# Patient Record
Sex: Female | Born: 1970
Health system: Southern US, Community
[De-identification: ages and names within clinical notes are randomized; demographics above are authoritative.]

## PROBLEM LIST (undated history)

## (undated) DIAGNOSIS — F419 Anxiety disorder, unspecified: Secondary | ICD-10-CM

## (undated) DIAGNOSIS — K219 Gastro-esophageal reflux disease without esophagitis: Secondary | ICD-10-CM

## (undated) DIAGNOSIS — R7989 Other specified abnormal findings of blood chemistry: Secondary | ICD-10-CM

## (undated) DIAGNOSIS — T884XXA Failed or difficult intubation, initial encounter: Secondary | ICD-10-CM

## (undated) DIAGNOSIS — M7710 Lateral epicondylitis, unspecified elbow: Secondary | ICD-10-CM

## (undated) DIAGNOSIS — R7309 Other abnormal glucose: Secondary | ICD-10-CM

## (undated) DIAGNOSIS — E785 Hyperlipidemia, unspecified: Secondary | ICD-10-CM

## (undated) DIAGNOSIS — R945 Abnormal results of liver function studies: Secondary | ICD-10-CM

## (undated) DIAGNOSIS — T7840XA Allergy, unspecified, initial encounter: Secondary | ICD-10-CM

## (undated) DIAGNOSIS — Z9889 Other specified postprocedural states: Secondary | ICD-10-CM

## (undated) HISTORY — PX: TONSILLECTOMY: SUR1361

## (undated) HISTORY — DX: Gastro-esophageal reflux disease without esophagitis: K21.9

## (undated) HISTORY — DX: Lateral epicondylitis, unspecified elbow: M77.10

## (undated) HISTORY — PX: FOOT SURGERY: SHX648

## (undated) HISTORY — DX: Anxiety disorder, unspecified: F41.9

## (undated) HISTORY — DX: Allergy, unspecified, initial encounter: T78.40XA

## (undated) HISTORY — DX: Other abnormal glucose: R73.09

## (undated) HISTORY — DX: Hyperlipidemia, unspecified: E78.5

## (undated) HISTORY — DX: Abnormal results of liver function studies: R94.5

## (undated) HISTORY — DX: Other specified postprocedural states: Z98.890

## (undated) HISTORY — DX: Other specified abnormal findings of blood chemistry: R79.89

## (undated) HISTORY — PX: ENDOMETRIAL ABLATION: SHX621

---

## 2001-10-11 ENCOUNTER — Encounter: Payer: Self-pay | Admitting: Emergency Medicine

## 2001-10-11 ENCOUNTER — Emergency Department (HOSPITAL_COMMUNITY): Admission: EM | Admit: 2001-10-11 | Discharge: 2001-10-11 | Payer: Self-pay | Admitting: Emergency Medicine

## 2004-08-12 ENCOUNTER — Encounter: Payer: Self-pay | Admitting: Family Medicine

## 2004-08-12 LAB — CONVERTED CEMR LAB

## 2004-08-21 ENCOUNTER — Other Ambulatory Visit: Admission: RE | Admit: 2004-08-21 | Discharge: 2004-08-21 | Payer: Self-pay | Admitting: Obstetrics and Gynecology

## 2005-01-14 ENCOUNTER — Ambulatory Visit: Payer: Self-pay | Admitting: Family Medicine

## 2005-02-13 ENCOUNTER — Ambulatory Visit: Payer: Self-pay | Admitting: Family Medicine

## 2005-02-19 ENCOUNTER — Ambulatory Visit: Payer: Self-pay | Admitting: Family Medicine

## 2005-06-24 ENCOUNTER — Ambulatory Visit: Payer: Self-pay | Admitting: Family Medicine

## 2005-08-10 ENCOUNTER — Ambulatory Visit: Payer: Self-pay | Admitting: Family Medicine

## 2005-09-04 ENCOUNTER — Ambulatory Visit: Payer: Self-pay | Admitting: Family Medicine

## 2005-11-13 ENCOUNTER — Other Ambulatory Visit: Admission: RE | Admit: 2005-11-13 | Discharge: 2005-11-13 | Payer: Self-pay | Admitting: Obstetrics and Gynecology

## 2005-12-04 ENCOUNTER — Ambulatory Visit: Payer: Self-pay | Admitting: Family Medicine

## 2006-02-03 ENCOUNTER — Ambulatory Visit: Payer: Self-pay | Admitting: Family Medicine

## 2006-12-04 ENCOUNTER — Emergency Department (HOSPITAL_COMMUNITY): Admission: EM | Admit: 2006-12-04 | Discharge: 2006-12-04 | Payer: Self-pay | Admitting: Emergency Medicine

## 2006-12-24 ENCOUNTER — Ambulatory Visit: Payer: Self-pay | Admitting: Family Medicine

## 2007-02-10 ENCOUNTER — Encounter: Payer: Self-pay | Admitting: Family Medicine

## 2007-02-10 DIAGNOSIS — IMO0002 Reserved for concepts with insufficient information to code with codable children: Secondary | ICD-10-CM | POA: Insufficient documentation

## 2007-02-10 DIAGNOSIS — E785 Hyperlipidemia, unspecified: Secondary | ICD-10-CM | POA: Insufficient documentation

## 2007-02-10 DIAGNOSIS — K589 Irritable bowel syndrome without diarrhea: Secondary | ICD-10-CM | POA: Insufficient documentation

## 2007-02-10 DIAGNOSIS — J309 Allergic rhinitis, unspecified: Secondary | ICD-10-CM | POA: Insufficient documentation

## 2007-02-10 DIAGNOSIS — K219 Gastro-esophageal reflux disease without esophagitis: Secondary | ICD-10-CM | POA: Insufficient documentation

## 2007-02-10 DIAGNOSIS — F438 Other reactions to severe stress: Secondary | ICD-10-CM

## 2007-02-10 DIAGNOSIS — F419 Anxiety disorder, unspecified: Secondary | ICD-10-CM | POA: Insufficient documentation

## 2007-02-11 ENCOUNTER — Ambulatory Visit: Payer: Self-pay | Admitting: Family Medicine

## 2007-02-11 DIAGNOSIS — D485 Neoplasm of uncertain behavior of skin: Secondary | ICD-10-CM | POA: Insufficient documentation

## 2007-02-14 ENCOUNTER — Encounter: Payer: Self-pay | Admitting: Family Medicine

## 2007-02-14 ENCOUNTER — Ambulatory Visit (HOSPITAL_BASED_OUTPATIENT_CLINIC_OR_DEPARTMENT_OTHER): Admission: RE | Admit: 2007-02-14 | Discharge: 2007-02-14 | Payer: Self-pay | Admitting: Podiatry

## 2007-05-28 ENCOUNTER — Ambulatory Visit: Payer: Self-pay | Admitting: Family Medicine

## 2007-05-28 ENCOUNTER — Encounter: Payer: Self-pay | Admitting: Family Medicine

## 2007-11-11 ENCOUNTER — Ambulatory Visit: Payer: Self-pay | Admitting: Family Medicine

## 2007-11-11 DIAGNOSIS — D2360 Other benign neoplasm of skin of unspecified upper limb, including shoulder: Secondary | ICD-10-CM | POA: Insufficient documentation

## 2008-01-06 ENCOUNTER — Encounter: Admission: RE | Admit: 2008-01-06 | Discharge: 2008-01-06 | Payer: Self-pay | Admitting: Obstetrics and Gynecology

## 2008-07-25 ENCOUNTER — Ambulatory Visit: Payer: Self-pay | Admitting: Family Medicine

## 2008-07-25 DIAGNOSIS — K1370 Unspecified lesions of oral mucosa: Secondary | ICD-10-CM | POA: Insufficient documentation

## 2009-05-08 ENCOUNTER — Ambulatory Visit: Payer: Self-pay | Admitting: Family Medicine

## 2009-12-26 ENCOUNTER — Encounter: Admission: RE | Admit: 2009-12-26 | Discharge: 2009-12-26 | Payer: Self-pay | Admitting: Obstetrics and Gynecology

## 2009-12-31 ENCOUNTER — Encounter: Admission: RE | Admit: 2009-12-31 | Discharge: 2009-12-31 | Payer: Self-pay | Admitting: Obstetrics and Gynecology

## 2010-06-10 ENCOUNTER — Telehealth: Payer: Self-pay | Admitting: Family Medicine

## 2010-11-02 ENCOUNTER — Encounter: Payer: Self-pay | Admitting: Obstetrics and Gynecology

## 2010-11-11 NOTE — Progress Notes (Signed)
Summary: Office Visit  Office Visit   Imported By: Mickle Asper 05/31/2007 13:06:57  _____________________________________________________________________  External Attachment:    Type:   Image     Comment:   External Document

## 2010-11-11 NOTE — Progress Notes (Signed)
Summary: Protonix 40mg   Phone Note Refill Request Call back at [phone] Message from:  [facility] pharmacy tom on June 10, 2010 3:47 PM  Refills Requested: Medication #1:  PROTONIX 40 MG TBEC Take one by mouth daily prn Elijah Birk, pharmacist from [facility] [phone] called for refill on Protonix 40mg . Pt was last seen 04/2009. Please advise.    Method Requested: Telephone to Pharmacy Initial call taken by: Lewanda Rife LPN,  June 10, 2010 3:46 PM  Follow-up for Phone Call        px written on EMR for call in  Follow-up by: Judith Part MD,  June 10, 2010 3:59 PM  Additional Follow-up for Phone Call Additional follow up Details #1::        [facility]. Lewanda Rife LPN  June 10, 2010 4:11 PM     New/Updated Medications: PROTONIX 40 MG TBEC (PANTOPRAZOLE SODIUM) Take one by mouth daily prn Prescriptions: PROTONIX 40 MG TBEC (PANTOPRAZOLE SODIUM) Take one by mouth daily prn  #30 x 5   Entered and Authorized by:   Judith Part MD   Signed by:   Lewanda Rife LPN on 10/14/7251   Method used:   Telephoned to ...         RxID:   [id number]

## 2010-11-11 NOTE — Assessment & Plan Note (Signed)
Summary: personal/rbh  Medications Added PAXIL 10 MG  TABS (PAROXETINE HCL) 1 by mouth at bedtime for 2 weeks and then 2 by mouth each bedtime      Allergies Added: NKDA  Vital Signs:  Patient Profile:   40 Years Old Female Height:     62.5 inches (158.75 cm) Weight:      176 pounds Temp:     97.8 degrees F oral Pulse rate:   68 / minute Pulse rhythm:   regular BP sitting:   100 / 60  (left arm) Cuff size:   large  Vitals Entered By: Providence Crosby (November 11, 2007 12:22 PM)                 Chief Complaint:  PERSONAL.  History of Present Illness: is some better today but has been really anxious lately -with tearfullness worse this month started working in fall- is having some guilt issure not sleeping- creates fatigue and stress which creates bad cycle  is doing graveyard shift- so more time with kids - not doing well with adjusting- is back and forth with day shifts no other issues is thinking about changing- but really likes her job has not been talking to anyone - but husband is good support will be talking to someone at [employer] soon ZO:[id number] at work (? opt of part time)  has been on paxil and zoloft in past   is getting startied exercise- yoga    Current Allergies: No known allergies      Review of Systems      See HPI  General      Complains of loss of appetite.  Eyes      Denies blurring.  CV      Denies chest pain or discomfort.  Resp      Denies shortness of breath.  Derm      spot on R arm to check  Psych      Denies suicidal thoughts/plans.   Physical Exam  General:     overweight but generally well appearing  Head:     normocephalic, atraumatic, and no abnormalities observed.   Eyes:     vision grossly intact, pupils equal, pupils round, and pupils reactive to light.   Mouth:     pharynx pink and moist.   Neck:     supple with full rom and no masses or thyromegally, no JVD or carotid bruit  Lungs:     Normal  respiratory effort, chest expands symmetrically. Lungs are clear to auscultation, no crackles or wheezes. Heart:     Normal rate and regular rhythm. S1 and S2 normal without gallop, murmur, click, rub or other extra sounds. Abdomen:     soft and non-tender.   Neurologic:     sensation intact to light touch, gait normal, and DTRs symmetrical and normal.  no tremor Skin:     Intact without suspicious lesions or rashes 3 mm dermatofibroma on R arm- brown and no irritation Cervical Nodes:     No lymphadenopathy noted Psych:     somewhat anxious- but communicates well with good insight tearful at times    Impression & Recommendations:  Problem # 1:  ANXIETY, SITUATIONAL (ICD-308.3) will look in to her counseling options and start back up with meditation and yoga paxil- re start t 10 adv to 20 f/u 2-3 mo- at pt preferance if worse or any suicidal thoughts- seek care  Problem # 2:  DERMATOFIBROMA, ARM (ICD-216.6) will watch for  growth or change- and f/u with derm  Problem # 3:  GERD (ICD-530.81) worsened by stressful situation will refil protonix adv to update if not imp Her updated medication list for this problem includes:    Protonix 40 Mg Tbec (Pantoprazole sodium) .Marland Kitchen... Take one by mouth daily prn   Complete Medication List: 1)  Protonix 40 Mg Tbec (Pantoprazole sodium) .... Take one by mouth daily prn 2)  Allegra 180 Mg Tabs (Fexofenadine hcl) .... Take one by mouth daily prn 3)  Multi-vitamin Tabs (Multiple vitamin) .... Take one by mouth daily 4)  Antacid Medicine Tabs (Homeopathic products) .... Take one by mouth prn 5)  Paxil 10 Mg Tabs (Paroxetine hcl) .Marland Kitchen.. 1 by mouth at bedtime for 2 weeks and then 2 by mouth each bedtime   Patient Instructions: 1)  if spot on your arm changes shape or color , follow up with your dermatologist 2)  start your paxil 10 mg each night for 2 weeks then 2 pills each bedtime 3)  if you feel worse anxiety or depression or you feel  suicidal- stop med and let me know  4)  go ahead and investigate your counseling options    Prescriptions: ALLEGRA 180 MG TABS (FEXOFENADINE HCL) Take one by mouth daily prn  #30 x 11   Entered and Authorized by:   Judith Part MD   Signed by:   Judith Part MD on 11/11/2007   Method used:   Print then Give to Patient   RxID:   [id number] PROTONIX 40 MG TBEC (PANTOPRAZOLE SODIUM) Take one by mouth daily prn  #30 x 11   Entered and Authorized by:   Judith Part MD   Signed by:   Judith Part MD on 11/11/2007   Method used:   Print then Give to Patient   RxID:   [id number] PAXIL 10 MG  TABS (PAROXETINE HCL) 1 by mouth at bedtime for 2 weeks and then 2 by mouth each bedtime  #60 x 3   Entered and Authorized by:   Judith Part MD   Signed by:   Judith Part MD on 11/11/2007   Method used:   Print then Give to Patient   RxID:   [id number]  ]

## 2010-11-11 NOTE — Assessment & Plan Note (Signed)
Summary: cat bite   Vital Signs:  Patient profile:   40 year old female Height:      62.5 inches Weight:      189 pounds BMI:     34.14 Temp:     98.1 degrees F oral Pulse rate:   84 / minute Pulse rhythm:   regular BP sitting:   112 / 70  (left arm) Cuff size:   regular  Vitals Entered By: Liane Comber CMA (May 08, 2009 2:00 PM)  History of Present Illness: was bitten by a cat  had a few punctures on her finger  was a stray cat - and she was trying to feed it  it bled some  turned it in to animal control -- ? if she needs rabies shots  cat was not acting funny   will be 10 days before she can get a report on the animal   was R index finger - does not hurt   no fever /feels fine   had dtap at work - within last 5 years  no med allergies      Allergies: No Known Drug Allergies  Past History:  Past Medical History: Last updated: 07/25/2008 Allergic rhinitis GERD Hyperlipidemia anxiety- situational  Past Surgical History: Last updated: 02/10/2007 C-S TSA  Family History: Last updated: 02/10/2007 father with CAD, THN DM2 mother ca in situ of appendix brother DM2  Social History: Last updated: 07/25/2008 Never Smoked married- to pharmacist twin daughters  works in nursing/ ICU  Risk Factors: Smoking Status: never (02/11/2007)  Review of Systems General:  Denies chills, fatigue, fever, loss of appetite, and malaise. Eyes:  Denies blurring, discharge, eye irritation, and eye pain. ENT:  Denies nasal congestion and sore throat. CV:  Denies chest pain or discomfort and palpitations. Resp:  Denies cough and wheezing. GI:  Denies abdominal pain and change in bowel habits. MS:  Denies joint pain, joint redness, and joint swelling. Derm:  Denies poor wound healing and rash. Neuro:  Denies numbness, tingling, and weakness. Heme:  Denies abnormal bruising.  Physical Exam  General:  Well-developed,well-nourished,in no acute distress;  alert,appropriate and cooperative throughout examination Head:  normocephalic, atraumatic, and no abnormalities observed.   Eyes:  vision grossly intact, pupils equal, pupils round, pupils reactive to light, and no injection.   Mouth:  pharynx pink and moist.   Neck:  No deformities, masses, or tenderness noted. Lungs:  Normal respiratory effort, chest expands symmetrically. Lungs are clear to auscultation, no crackles or wheezes. Heart:  Normal rate and regular rhythm. S1 and S2 normal without gallop, murmur, click, rub or other extra sounds. Msk:  R index finger tiny puncture wound - no redness/tenderness or drainage Extremities:  No clubbing, cyanosis, edema, or deformity noted with normal full range of motion of all joints.   Neurologic:  strength normal in all extremities and sensation intact to light touch.  -no deficits in R hand  Skin:  no rash  puncture wound is not erythematous Cervical Nodes:  No lymphadenopathy noted Psych:  normal affect, talkative and pleasant    Impression & Recommendations:  Problem # 1:  CAT BITE (ICD-E906.3) stray cat/ provoked- now in Ecologist and obs  did check with [facility]- told she does not need to start rabies imm since animal is in quarantine - will wait for results is utd Dtap within 3-5 y will prophylax with augmentin two times a day -- given px  adv to keep wound clean  and dry -- is no infected appearing adv to call if redess or swelling  Complete Medication List: 1)  Protonix 40 Mg Tbec (Pantoprazole sodium) .... Take one by mouth daily prn 2)  Allegra 180 Mg Tabs (Fexofenadine hcl) .... Take one by mouth daily prn 3)  Multi-vitamin Tabs (Multiple vitamin) .... Take one by mouth daily 4)  Fish Oil Caps  .... Daily 5)  Calcium  .... Daily 6)  Augmentin 875-125 Mg Tabs (Amoxicillin-pot clavulanate) .Marland Kitchen.. 1 by mouth two times a day for 10 days  Patient Instructions: 1)  keep wound very clean - you can use some triple  antiboitic ointment  2)  if finger starts to hurt / swell/turn red or you have any fever - please update me asap  3)  take the augmentin as directed  4)  update me when you are updated by animal control  Prescriptions: PROTONIX 40 MG TBEC (PANTOPRAZOLE SODIUM) Take one by mouth daily prn  #30 x 11   Entered and Authorized by:   Judith Part MD   Signed by:   Judith Part MD on 05/08/2009   Method used:   Print then Give to Patient   RxID:   [id number] AUGMENTIN 875-125 MG TABS (AMOXICILLIN-POT CLAVULANATE) 1 by mouth two times a day for 10 days  #20 x 0   Entered and Authorized by:   Judith Part MD   Signed by:   Judith Part MD on 05/08/2009   Method used:   Print then Give to Patient   RxID:   [id number]   Prior Medications (reviewed today): PROTONIX 40 MG TBEC (PANTOPRAZOLE SODIUM) Take one by mouth daily prn ALLEGRA 180 MG TABS (FEXOFENADINE HCL) Take one by mouth daily prn MULTI-VITAMIN  TABS (MULTIPLE VITAMIN) Take one by mouth daily FISH OIL CAPS () Daily CALCIUM () daily AUGMENTIN 875-125 MG TABS (AMOXICILLIN-POT CLAVULANATE) 1 by mouth two times a day for 10 days Current Allergies (reviewed today): No known allergies  Current Medications (including changes made in today's visit):  PROTONIX 40 MG TBEC (PANTOPRAZOLE SODIUM) Take one by mouth daily prn ALLEGRA 180 MG TABS (FEXOFENADINE HCL) Take one by mouth daily prn MULTI-VITAMIN  TABS (MULTIPLE VITAMIN) Take one by mouth daily * FISH OIL CAPS Daily * CALCIUM daily AUGMENTIN 875-125 MG TABS (AMOXICILLIN-POT CLAVULANATE) 1 by mouth two times a day for 10 days

## 2010-11-11 NOTE — Assessment & Plan Note (Signed)
Summary: ?INFECTION IN MOUTH/CLE   Vital Signs:  Patient Profile:   40 Years Old Female Height:     62.5 inches (158.75 cm) Weight:      186 pounds BMI:     33.60 Temp:     98.1 degrees F oral Pulse rate:   80 / minute Pulse rhythm:   regular BP sitting:   120 / 64  (left arm) Cuff size:   large  Vitals Entered By: Liane Comber (July 25, 2008 3:35 PM)                 Chief Complaint:  infection in mouth.  History of Present Illness: some blisters on upper lip - and inside of lip too  thought over the weekend - that she had dry lips- and then it got worse  now throat is a little sore  no fever - but kids had a fever over the weekend   has never had this happen before   husband gets cold sores   no rash on palms or soles   stress level is ok  work is always stressful   uses burt's bees -- lip emolient  used chap stick  no lip stick  uses pro active -- on her face        Current Allergies (reviewed today): No known allergies   Past Medical History:    Allergic rhinitis    GERD    Hyperlipidemia    anxiety- situational  Past Surgical History:    Reviewed history from 02/10/2007 and no changes required:       C-S       TSA   Family History:    Reviewed history from 02/10/2007 and no changes required:       father with CAD, THN DM2       mother ca in situ of appendix       brother DM2  Social History:    Never Smoked    married- to pharmacist    twin daughters     works in nursing/ ICU    Review of Systems  General      Denies chills, fever, loss of appetite, and malaise.  Eyes      Denies blurring, discharge, eye irritation, and itching.  ENT      Denies earache, nasal congestion, sinus pressure, and sore throat.      no swelling of mouth/tounge/throat or sob   CV      Denies chest pain or discomfort and palpitations.  Resp      Denies cough, shortness of breath, and wheezing.  GI      Denies abdominal pain, bloody  stools, and change in bowel habits.  MS      Denies joint pain.  Derm      Complains of itching and rash.  Neuro      Complains of tingling.      Denies numbness.  Psych      mood is good    Physical Exam  General:     overweight but generally well appearing  Head:     normocephalic, atraumatic, and no abnormalities observed.  no sinus tenderness  Eyes:     vision grossly intact, pupils equal, pupils round, pupils reactive to light, and no injection.   Ears:     R ear normal and L ear normal.   Nose:     nares are injected but clear  Mouth:     pharynx pink  and moist, no erythema, and no exudates.  some vesicular lesions inside upper and lower lips  diffuse rash of tiny vesicles over bottom lip- very slt swelling  Neck:     No deformities, masses, or tenderness noted. Chest Wall:     No deformities, masses, or tenderness noted. Lungs:     Normal respiratory effort, chest expands symmetrically. Lungs are clear to auscultation, no crackles or wheezes. Heart:     Normal rate and regular rhythm. S1 and S2 normal without gallop, murmur, click, rub or other extra sounds. Abdomen:     soft and non-tender.   Skin:     Intact without suspicious lesions or rashes Cervical Nodes:     No lymphadenopathy noted Psych:     normal affect, talkative and pleasant     Impression & Recommendations:  Problem # 1:  OTHER&UNSPECIFIED DISEASES THE ORAL SOFT TISSUES (ICD-528.9) Assessment: New with small vesicular breakout on lips and irritaiton mouth /gums  differential includes viral illness and allergic reaction will try duke's magic mouthwas-- three times a day and avoid acidic or overly warm foods switch to plain petroleum jelly on lips - no other product with color or fragrance  Complete Medication List: 1)  Protonix 40 Mg Tbec (Pantoprazole sodium) .... Take one by mouth daily prn 2)  Allegra 180 Mg Tabs (Fexofenadine hcl) .... Take one by mouth daily prn 3)   Multi-vitamin Tabs (Multiple vitamin) .... Take one by mouth daily 4)  Antacid Medicine Tabs (Homeopathic products) .... Take one by mouth prn 5)  Fish Oil Caps  .... Daily 6)  Calcium  .... Daily 7)  Duke's Magic Mouthwash  .... Gargle and spit out 1-2 teaspoons up to three times daily   Patient Instructions: 1)  use duke's magic mouthwash - up to three times per day  2)  use plain vaseline on you lips -- no other products until this clears up  3)  you can try some oral benadryl if any itching in mouth or throat  4)  update me if fever or bad sore throat or if not improved in 1 week   Prescriptions: DUKE'S MAGIC MOUTHWASH gargle and spit out 1-2 teaspoons up to three times daily  #150 cc x 1   Entered and Authorized by:   Judith Part MD   Signed by:   Judith Part MD on 07/25/2008   Method used:   Print then Give to Patient   RxID:   [id number]  ]

## 2011-01-27 ENCOUNTER — Emergency Department (HOSPITAL_COMMUNITY)
Admission: EM | Admit: 2011-01-27 | Discharge: 2011-01-27 | Disposition: A | Discharge status: Home / Self Care | Payer: 59 | Attending: Emergency Medicine | Admitting: Emergency Medicine

## 2011-01-27 DIAGNOSIS — R109 Unspecified abdominal pain: Secondary | ICD-10-CM | POA: Insufficient documentation

## 2011-01-27 DIAGNOSIS — E876 Hypokalemia: Secondary | ICD-10-CM | POA: Insufficient documentation

## 2011-01-27 DIAGNOSIS — K219 Gastro-esophageal reflux disease without esophagitis: Secondary | ICD-10-CM | POA: Insufficient documentation

## 2011-01-27 DIAGNOSIS — R002 Palpitations: Secondary | ICD-10-CM | POA: Insufficient documentation

## 2011-01-27 DIAGNOSIS — R197 Diarrhea, unspecified: Secondary | ICD-10-CM | POA: Insufficient documentation

## 2011-01-27 DIAGNOSIS — R000 Tachycardia, unspecified: Secondary | ICD-10-CM | POA: Insufficient documentation

## 2011-01-27 LAB — POCT CARDIAC MARKERS
CKMB, poc: <1 ng/mL — ABNORMAL LOW (ref 1.0–8.0)
Myoglobin, poc: 60.2 ng/mL (ref 12–200)
Troponin i, poc: <0.05 ng/mL (ref 0.00–0.09)

## 2011-01-27 LAB — DIFFERENTIAL
Basophils Absolute: 0 10*3/uL (ref 0.0–0.1)
Basophils Relative: 1 % (ref 0–1)
Eosinophils Absolute: 0.1 10*3/uL (ref 0.0–0.7)
Eosinophils Relative: 1 % (ref 0–5)
Lymphocytes Relative: 24 % (ref 12–46)
Lymphs Abs: 1.7 10*3/uL (ref 0.7–4.0)
Monocytes Absolute: 0.6 10*3/uL (ref 0.1–1.0)
Monocytes Relative: 8 % (ref 3–12)
Neutro Abs: 4.9 10*3/uL (ref 1.7–7.7)
Neutrophils Relative %: 67 % (ref 43–77)

## 2011-01-27 LAB — URINALYSIS, ROUTINE W REFLEX MICROSCOPIC
Bilirubin Urine: NEGATIVE
Glucose, UA: NEGATIVE mg/dL
Hgb urine dipstick: NEGATIVE
Ketones, ur: NEGATIVE mg/dL
Nitrite: NEGATIVE
Protein, ur: NEGATIVE mg/dL
Specific Gravity, Urine: 1.01 (ref 1.005–1.030)
Urobilinogen, UA: 0.2 mg/dL (ref 0.0–1.0)
pH: 6 (ref 5.0–8.0)

## 2011-01-27 LAB — BASIC METABOLIC PANEL
BUN: 9 mg/dL (ref 6–23)
CO2: 22 mEq/L (ref 19–32)
Calcium: 8.9 mg/dL (ref 8.4–10.5)
Chloride: 103 mEq/L (ref 96–112)
Creatinine, Ser: 0.86 mg/dL (ref 0.4–1.2)
GFR calc Af Amer: >60 mL/min (ref >60)
GFR calc non Af Amer: >60 mL/min (ref >60)
Glucose, Bld: 120 mg/dL — ABNORMAL HIGH (ref 70–99)
Potassium: 3.3 mEq/L — ABNORMAL LOW (ref 3.5–5.1)
Sodium: 134 mEq/L — ABNORMAL LOW (ref 135–145)

## 2011-01-27 LAB — CBC
HCT: 40.3 % (ref 36.0–46.0)
Hemoglobin: 14.2 g/dL (ref 12.0–15.0)
MCH: 31.8 pg (ref 26.0–34.0)
MCHC: 35.2 g/dL (ref 30.0–36.0)
MCV: 90.2 fL (ref 78.0–100.0)
Platelets: 229 10*3/uL (ref 150–400)
RBC: 4.47 MIL/uL (ref 3.87–5.11)
RDW: 12.5 % (ref 11.5–15.5)
WBC: 7.3 10*3/uL (ref 4.0–10.5)

## 2011-01-27 LAB — D-DIMER, QUANTITATIVE: D-Dimer, Quant: 0.35 ug/mL-FEU (ref 0.00–0.48)

## 2011-01-28 ENCOUNTER — Encounter: Payer: Self-pay | Admitting: Family Medicine

## 2011-01-30 ENCOUNTER — Encounter: Payer: Self-pay | Admitting: Family Medicine

## 2011-01-30 ENCOUNTER — Ambulatory Visit (INDEPENDENT_AMBULATORY_CARE_PROVIDER_SITE_OTHER): Payer: 59 | Admitting: Family Medicine

## 2011-01-30 DIAGNOSIS — J069 Acute upper respiratory infection, unspecified: Secondary | ICD-10-CM | POA: Insufficient documentation

## 2011-01-30 DIAGNOSIS — R42 Dizziness and giddiness: Secondary | ICD-10-CM

## 2011-01-30 DIAGNOSIS — R197 Diarrhea, unspecified: Secondary | ICD-10-CM

## 2011-01-30 DIAGNOSIS — R000 Tachycardia, unspecified: Secondary | ICD-10-CM

## 2011-01-30 MED ORDER — MECLIZINE HCL 25 MG PO TABS: 25.0000 mg | ORAL_TABLET | Freq: Three times a day (TID) | ORAL | Status: AC | PRN

## 2011-01-30 MED ORDER — PANTOPRAZOLE SODIUM 40 MG PO TBEC: 40.0000 mg | DELAYED_RELEASE_TABLET | Freq: Every day | ORAL | Status: DC

## 2011-01-30 NOTE — Assessment & Plan Note (Signed)
Mild and worsening her dizziness due to etd Disc sympt care Update if worse or no imp

## 2011-01-30 NOTE — Assessment & Plan Note (Signed)
Post traveliing- much imp now  Rev ER records with tachycardia from dehydration Is well hydrated now Urged to update me if no further imp

## 2011-01-30 NOTE — Patient Instructions (Signed)
Keep up good fluid intake Let me know if your diarrhea comes back or if dizziness does not improve  Try meclizine for dizziness - watch out- this can sedate  Heart rate is normal today

## 2011-01-30 NOTE — Assessment & Plan Note (Signed)
Vertigo type Suspect multifactorial- recent boat travel and uri with etd and recent dehydration Not orthostatic today Trial of meclizine and update- warned of sedation

## 2011-01-30 NOTE — Assessment & Plan Note (Signed)
Rev ER notes Nl exam and pulse today Much better Was caused by dehydration and now resolved  Update if any changes Will continue to follow

## 2011-01-30 NOTE — Progress Notes (Signed)
  Subjective:    Patient ID: Jamie Coleman, female    DOB: 1971-09-08, 40 y.o.   MRN: [id number]  HPI  Here for f/u ER visit with sinus tachycardia This followed 4 d of diarrhea Gradually improving -- and none today- is really happy with that  Ate tacos in [place]-- then she got sick  Had a good cruise  Also came home with a head cold- a bit dizzy of that   No n/v or appetite change   In ER K was 3.3  Did give her some in ER HR was 122  No blood in stool  Had abd cramping before diarrhea   No cramps - eating high K foods   Is feeling much better   Nl vitals today with pulse of 80 and regular and bp 106/74 Wt is down 3 lb  Now has a cold with some cough and runny/ stuffy nose No fever  Feels ok  This has made her dizzy-- mild/ like the room spins  Worse to turn head  No orthostatic symptoms   Review of Systems  HENT: Positive for congestion, rhinorrhea and postnasal drip. Negative for sinus pressure.   Gastrointestinal: Negative for nausea and vomiting.  Review of Systems  Constitutional: Negative for fever, appetite change, fatigue and unexpected weight change.  Eyes: Negative for pain and visual disturbance.  Respiratory: Negative for cough and shortness of breath.   Cardiovascular: Negative.   Gastrointestinal: Negative for nausea, diarrhea and constipation.  Genitourinary: Negative for urgency and frequency.  Skin: Negative for pallor.  Neurological: Negative for weakness, light-headedness, numbness and headaches.  Hematological: Negative for adenopathy. Does not bruise/bleed easily.  Psychiatric/Behavioral: Negative for dysphoric mood. The patient is not nervous/anxious.          Objective:   Physical Exam  Constitutional: She appears well-developed and well-nourished. No distress.       overwt and well appearing   HENT:  Head: Normocephalic and atraumatic.  Right Ear: External ear normal.  Left Ear: External ear normal.  Mouth/Throat: Oropharynx is  clear and moist.       Nares are congested and injected  No sinus tenderness  Eyes: Conjunctivae and EOM are normal. Pupils are equal, round, and reactive to light.  Neck: Normal range of motion. Neck supple. No JVD present. Carotid bruit is not present. No thyromegaly present.  Cardiovascular: Normal rate and regular rhythm.   No murmur heard. Pulmonary/Chest: Effort normal and breath sounds normal. She has no wheezes. She has no rales.  Abdominal: Soft. Bowel sounds are normal. She exhibits no distension and no mass. There is no tenderness. There is no rebound and no guarding.  Musculoskeletal: She exhibits no edema and no tenderness.  Lymphadenopathy:    She has no cervical adenopathy.  Neurological: She is alert. She has normal strength and normal reflexes. She displays normal reflexes. No cranial nerve deficit or sensory deficit. Coordination and gait normal.       No tremor 1-2 beats of horiz nystagmus   Skin: Skin is warm and dry. No rash noted. No pallor.       Nl cap refil time   Psychiatric: She has a normal mood and affect.          Assessment & Plan:

## 2011-02-11 ENCOUNTER — Ambulatory Visit: Payer: Self-pay | Admitting: Family Medicine

## 2011-02-27 NOTE — Op Note (Signed)
Jamie Coleman, Jamie Coleman             ACCOUNT NO.:  [id number]   MEDICAL RECORD NO.:  [id number]           PATIENT TYPE:   LOCATION:                                 FACILITY:   PHYSICIAN:  Norma S. Regal, D.P.M. DATE OF BIRTH:  [birth year]   DATE OF PROCEDURE:  DATE OF DISCHARGE:                               OPERATIVE REPORT   PREOPERATIVE DIAGNOSIS:  Hallux abductovalgus deformity, left.   POSTOPERATIVE DIAGNOSIS:  Hallux abductovalgus deformity, left.   PROCEDURE:  Austin with 0.045 K-wire, left.   INDICATIONS:  Chronic discomfort, inability to wear shoe gear without  difficulty.  He has tried wider shoes and ultimately failed to have  relief of symptoms.   FINDINGS/PROCEDURES:  The patient is brought to the OR, placed in the  supine position on the OR table.  The patient was injected with a total  of 10 mL of Xylocaine/Marcaine mixture.  The patient's left foot was  prepped and draped utilizing standard aseptic technique.  The left foot  was exsanguinated, utilizing Esmarch, and the left ankle tourniquet was  inflated to 250 mmHg.  The following procedure was performed.  Attention  was directed to the dorsal aspect, left foot, where an approximate 7 cm  linear incision was made.  The incision was deepened through  subcutaneous tissue down to the capsule with hemostasis being acquired  as necessary.  An inverted L-shaped capsular incision was performed at  the level of the first metatarsal and the capsular tissue was sharply  dissected off the underlying bone.  The medial eminence was resected  flush with the shaft of the first metatarsal and a V-shaped osteotomy  was performed in the first metatarsal with apex mid metaphysis, base at  the level of the anatomical neck of the first metatarsal.  The capital  fragment was transposed in a lateral direction so as to reduce the 1-2  intermetatarsal angle and was fixated utilizing 0.045 K-wire.  The angle  was found to be satisfactorily  resected at the current time and was  found to be in good alignment.  The capsular tissue was  reapproximated/maintained utilizing 3-0 Dexon in a continuous running  fashion.  Subcutaneous tissue reapproximated/maintained utilizing 4-0  Dexon in a subcutaneous fashion.  The patient tolerated surgery and  anesthesia well and was transported to the OR in satisfactory condition  with good position and was found to be satisfactory and was discharged  by the department of anesthesia.           ______________________________  Ysidro Evert. Regal, D.P.M.     NSR/MEDQ  D:  04/29/2007  T:  04/29/2007  Job:  [id number]

## 2011-02-27 NOTE — Op Note (Signed)
NAMEANICKA, Jamie Coleman             ACCOUNT NO.:  [id number]   MEDICAL RECORD NO.:  [id number]          PATIENT TYPE:   LOCATION:                                 FACILITY:   PHYSICIAN:  Lenn Sink, D.P.M.     DATE OF BIRTH:   DATE OF PROCEDURE:  DATE OF DISCHARGE:                               OPERATIVE REPORT   PREOPERATIVE DIAGNOSIS:  Hallux abductovalgus deformity of left.   POSTOPERATIVE DIAGNOSES:  Hallux abductovalgus deformity of left.   PROCEDURE:  [place] with 0.045 K-wire of left.   INDICATIONS:  Chronic discomfort, inability to wear shoe gear without  difficulties, tried wider shoes, and other modalities.  X-rays indicate  elevation of the intermetatarsal ankle.   SURGEON:  Dr. Charlsie Merles.   FINDINGS AND PROCEDURE:  The patient was brought to the OR and placed in  a supine position on the OR table.  The patient injected with a total of  10 mL of Xylocaine Marcaine mixture, the patient's left foot was prepped  and draped utilizing standard aseptic technique.  The left foot was  exsanguinated with Esmarch  bandage, and the tourniquet was inflated to  250 mmHg.  The following procedure was performed.  Attention was  directed to the dorsal aspect of the left foot where a 7-cm linear  incision was made medial to the extensor hallucis longus tendon.  The  incision was deep and intrusive to these tissues down in the capsule,  and an inverted L-shaped capsular tissue was performed.  The capsular  tissue was sharply dissected off the underlying bone, revealing a large  hyperostosis  on the medial aspect of the metatarsal head.  Utilizing  power saw, this roughened medial hyperostosis was resected, flushed with  the shaft of the first metatarsal.   Attention was then directed to the intermetatarsal space where utilizing  sharp blunt dissection, the conjoined tendon of the adductor hallucis  muscle was identified and sectioned at its insertion of the base of the  proximal  phalanx.  The fibular sesamoid was identified and debrided from  all surrounding soft tissue attachments except in the the intersesamoid  ligament.  Attention was then directed back to the medial aspect of the  first metatarsal where a V-shaped osteotomy was performed of the first  metatarsal head with the apex __________ metaphysis base at the  anatomical neck of the  first metatarsal.  The capital fragment was  transposed in a lateral direction and was fixated utilizing 0.45  K-  wire.  The redundant medial shelf was resected, flushed with the shaft  to the first metatarsal, and roughened bone edges were rasped smooth.  The wound was flushed with copious amounts of Garamycin solution.  Everything was in good alignment.  Capsular tissue reapproximated  utilizing 3-0 Dexon in a continuous running fashion.  Subcutaneous  tissue reapproximated utilizing 4-0 Dexon a continuous running fashion,  and skin margins reapproximated utilizing 5-0 Monocryl in a subcuticular  fashion.  Surgical site infiltrated with 1 mL of dexamethasone; dry,  sterile dressing was applied to the left foot.  The left ankle  tourniquet  was deflated.  Capillary  refill was noted to be immediate to all digits of the right foot.  The  patient, having tolerated both surgery  and anesthesia well, was  transferred out of the OR to recovery in satisfactory condition and  discharged by anesthesia with postoperative wound instructions and  medications.           ______________________________  Lenn Sink, D.P.M.     NSR/MEDQ  D:  05/31/2007  T:  05/31/2007  Job:  [id number]

## 2011-05-04 ENCOUNTER — Encounter: Payer: Self-pay | Admitting: Family Medicine

## 2011-05-04 ENCOUNTER — Ambulatory Visit (INDEPENDENT_AMBULATORY_CARE_PROVIDER_SITE_OTHER): Payer: 59 | Admitting: Family Medicine

## 2011-05-04 DIAGNOSIS — M25511 Pain in right shoulder: Secondary | ICD-10-CM

## 2011-05-04 DIAGNOSIS — E785 Hyperlipidemia, unspecified: Secondary | ICD-10-CM

## 2011-05-04 DIAGNOSIS — M25519 Pain in unspecified shoulder: Secondary | ICD-10-CM

## 2011-05-04 NOTE — Assessment & Plan Note (Signed)
LDL is 172 with fam hx but admittedly poor diet- esp after vacation Rev low sat fat diet in detail  Will follow this and work on wt loss and exercise  Lab in 3 months - if not imp need to consider statin

## 2011-05-04 NOTE — Assessment & Plan Note (Signed)
Suspect tendonitis - bicep or rotator cuff Nl hawkings/ pos neer - some pain with full abduction and some "clicking"  Has failed cons tx with nsaid Recommend ice Passive rom (finger crawl) exercise  Ref to sport med Dr Patsy Lager for eval - ? If xray or injection may be helpful

## 2011-05-04 NOTE — Progress Notes (Signed)
Subjective:    Patient ID: Jamie Coleman, female    DOB: 07/24/1971, 40 y.o.   MRN: [id number]  HPI Has had pain in R shoulder -- and going on 2 months  No injury  Likes to play ball but not lately- and cannot throw now  Even hurts to walk- hanging down  No repeditive movements   Right now not too painful - but hurts to lift arm/ or reach around  Is anterior in shoulder  Also something pops and catches  No swelling  No ref to back or neck  At times whole top of arm hurt   Had labs outside of office  Was week after she got dehydration from diarrhea  Cbc normal  ALT slt high at 48   Trig 111, HDL 60 , LDL 172   Worked on diet - went on vacation and blew it again  Thinks she can get it down with diet  This does run in family  Father on chol med and DM  Took 800 mg of motrin bid to tid  Helped not much  No ice   Patient Active Problem List  Diagnoses  . DERMATOFIBROMA, ARM  . NEOPLASM, SKIN, UNCERTAIN BEHAVIOR  . HYPERLIPIDEMIA  . ANXIETY, SITUATIONAL  . ALLERGIC RHINITIS  . OTHER&UNSPECIFIED DISEASES THE ORAL SOFT TISSUES  . GERD  . IBS  . POSTPARTUM DEPRESSION  . Diarrhea  . Tachycardia  . URI (upper respiratory infection)  . Dizziness  . Shoulder pain, right   Past Medical History  Diagnosis Date  . Allergy     allergic rhinitis  . GERD (gastroesophageal reflux disease)   . Hyperlipidemia   . Anxiety     situational   Past Surgical History  Procedure Date  . Cesarean section    History  Substance Use Topics  . Smoking status: Never Smoker   . Smokeless tobacco: Not on file  . Alcohol Use:    Family History  Problem Relation Age of Onset  . Cancer Mother     [place] insitu of appendix  . Heart disease Father     CAD  . Diabetes Father     type II  . Diabetes Brother     type II   No Known Allergies Current Outpatient Prescriptions on File Prior to Visit  Medication Sig Dispense Refill  . Calcium Carbonate-Vit D-Min 600-400 MG-UNIT TABS  1-2 tablets by mouth daily.       . Multiple Vitamin (MULTIVITAMIN) capsule Take 1 capsule by mouth daily.        . Omega-3 Fatty Acids (FISH OIL PO) Take by mouth daily.        . pantoprazole (PROTONIX) 40 MG tablet Take 1 tablet (40 mg total) by mouth daily. as needed.  30 tablet  11  . fexofenadine (ALLEGRA) 180 MG tablet Take 180 mg by mouth daily. As needed.       . meclizine (ANTIVERT) 25 MG tablet Take 1 tablet (25 mg total) by mouth 3 (three) times daily as needed for dizziness or nausea.  30 tablet  1        Review of Systems Review of Systems  Constitutional: Negative for fever, appetite change, fatigue and unexpected weight change.  Eyes: Negative for pain and visual disturbance.  Respiratory: Negative for cough and shortness of breath.   Cardiovascular: Negative. For cp or sob or palpitation  Gastrointestinal: Negative for nausea, diarrhea and constipation.  Genitourinary: Negative for urgency and frequency.  Skin: Negative  for pallor. or rash  Neurological: Negative for weakness, light-headedness, numbness and headaches.  Hematological: Negative for adenopathy. Does not bruise/bleed easily.  Psychiatric/Behavioral: Negative for dysphoric mood. The patient is not nervous/anxious.          Objective:   Physical Exam  Constitutional: She appears well-developed and well-nourished. No distress.       overwt and well appearing   HENT:  Head: Normocephalic and atraumatic.  Mouth/Throat: Oropharynx is clear and moist.  Eyes: Conjunctivae and EOM are normal. Pupils are equal, round, and reactive to light.  Neck: Normal range of motion. Neck supple. No JVD present. No thyromegaly present.  Cardiovascular: Normal rate, regular rhythm, normal heart sounds and intact distal pulses.   Pulmonary/Chest: Effort normal and breath sounds normal. No respiratory distress. She has no wheezes.  Musculoskeletal: She exhibits tenderness. She exhibits no edema.       See neurol exam     Lymphadenopathy:    She has no cervical adenopathy.  Neurological: She is alert. She has normal strength. She displays no atrophy. No cranial nerve deficit or sensory deficit. Coordination normal.       R arm - tender over bicep tendon/ not acromion  No swelling or skin change Full rom- pain on full abd and lateral reach over  Grip ok Neg hawkings Pos neer Nl int/ext rot with some discomfort   Skin: Skin is warm and dry. No rash noted. No erythema. No pallor.  Psychiatric: She has a normal mood and affect.          Assessment & Plan:

## 2011-05-04 NOTE — Patient Instructions (Addendum)
Avoid red meat/ fried foods/ egg yolks/ fatty breakfast meats/ butter, cheese and high fat dairy/ and shellfish   Use ice on shoulder 10 minutes at a time - whenever you get a chance  Continue the motrin if it helps and does not hurt your stomach  Do the finger crawl on the wall passive motion exercise 2-3 times per day  We will schedule appt with Dr Patsy Lager at check out for shoulder pain  Schedule fasting lab in 3 months - work on diet and exercise

## 2011-05-13 ENCOUNTER — Encounter: Payer: Self-pay | Admitting: Family Medicine

## 2011-05-13 ENCOUNTER — Ambulatory Visit (INDEPENDENT_AMBULATORY_CARE_PROVIDER_SITE_OTHER): Payer: 59 | Admitting: Family Medicine

## 2011-05-13 VITALS — BP 110/70 | HR 83 | Temp 98.8°F | Wt 187.5 lb

## 2011-05-13 DIAGNOSIS — M25819 Other specified joint disorders, unspecified shoulder: Secondary | ICD-10-CM

## 2011-05-13 DIAGNOSIS — M7541 Impingement syndrome of right shoulder: Secondary | ICD-10-CM

## 2011-05-13 HISTORY — PX: TUBAL LIGATION: SHX77

## 2011-05-13 NOTE — Progress Notes (Signed)
Jamie Coleman, a 40 y.o. female presents today in the office for the following:    Has been hurting for a long time.  In [place] a few weeks ago, hurt some with hanging.  Has not been able to throw  Sleeping Lifting shirt  The patient noted above presents with shoulder pain that has been ongoing for a few months on the R  there is no history of trauma or accident recently The patient denies neck pain or radicular symptoms. Denies dislocation, subluxation, separation of the shoulder. The patient does complain of pain in the overhead plane with painful arc of motion and pain with throwing  Medications Tried: Tylenol, NSAIDS Ice or Heat: minimally helpful Tried PT: No  Prior shoulder Injury: No Prior surgery: No Prior fracture: No  The PMH, PSH, Social History, Family History, Medications, and allergies have been reviewed in [facility], and have been updated if relevant.  REVIEW OF SYSTEMS  GEN: No fevers, chills. Nontoxic. Primarily MSK c/o today. MSK: Detailed in the HPI GI: tolerating PO intake without difficulty Neuro: No numbness, parasthesias, or tingling associated. Otherwise the pertinent positives of the ROS are noted above.   PHYSICAL EXAM  Blood pressure 110/70, pulse 83, temperature 98.8 F (37.1 C), weight 187 lb 8 oz (85.049 kg).  GEN: Well-developed,well-nourished,in no acute distress; alert,appropriate and cooperative throughout examination HEENT: Normocephalic and atraumatic without obvious abnormalities. Ears, externally no deformities PULM: Breathing comfortably in no respiratory distress EXT: No clubbing, cyanosis, or edema PSYCH: Normally interactive. Cooperative during the interview. Pleasant. Friendly and conversant. Not anxious or depressed appearing. Normal, full affect.  Shoulder: r Inspection: No muscle wasting or winging Ecchymosis/edema: neg  AC joint, scapula, clavicle: NT Cervical spine: NT, full ROM Spurling's: neg Abduction: full, 5/5 Flexion:  full, 5/5 IR, full, lift-off: 5/5 ER at neutral: full, 5/5 AC crossover: neg Neer: pos Hawkins: pos Drop Test: neg Empty Can: pos Supraspinatus insertion: minimal tenderness Bicipital groove: NT Speed's: neg Yergason's: neg Sulcus sign: neg Scapular dyskinesis: none C5-T1 intact  Neuro: Sensation intact Grip 5/5    1. Rotator Cuff tendinopathy: >25 minutes spent in face to face time with patient, >50% spent in counselling or coordination of care  Rotator cuff strengthening and scapular stabilization exercises were reviewed with the patient. Retraining shoulder mechanics and function was emphasized to the patient with rehab done at least 5-6 days a week.  Formal PT to assist with scapular stabilization and RTC strengthening.  Without impingement, no real pain, so hold on any interventions right now

## 2011-05-13 NOTE — Patient Instructions (Signed)
Recheck 5-6 weeks

## 2011-05-25 ENCOUNTER — Ambulatory Visit: Payer: 59 | Attending: Family Medicine | Admitting: Physical Therapy

## 2011-05-25 DIAGNOSIS — M25519 Pain in unspecified shoulder: Secondary | ICD-10-CM | POA: Insufficient documentation

## 2011-05-25 DIAGNOSIS — IMO0001 Reserved for inherently not codable concepts without codable children: Secondary | ICD-10-CM | POA: Insufficient documentation

## 2011-05-25 DIAGNOSIS — M25619 Stiffness of unspecified shoulder, not elsewhere classified: Secondary | ICD-10-CM | POA: Insufficient documentation

## 2011-06-03 ENCOUNTER — Ambulatory Visit: Payer: 59 | Admitting: Physical Therapy

## 2011-06-03 ENCOUNTER — Inpatient Hospital Stay (HOSPITAL_COMMUNITY): Admission: RE | Admit: 2011-06-03 | Payer: 59 | Source: Ambulatory Visit

## 2011-06-04 ENCOUNTER — Other Ambulatory Visit: Payer: Self-pay | Admitting: Obstetrics and Gynecology

## 2011-06-05 ENCOUNTER — Encounter (HOSPITAL_COMMUNITY)
Admission: RE | Admit: 2011-06-05 | Discharge: 2011-06-05 | Disposition: A | Discharge status: Home / Self Care | Payer: 59 | Source: Ambulatory Visit | Attending: Obstetrics and Gynecology | Admitting: Obstetrics and Gynecology

## 2011-06-05 ENCOUNTER — Encounter: Payer: 59 | Admitting: Physical Therapy

## 2011-06-05 ENCOUNTER — Encounter (HOSPITAL_COMMUNITY): Payer: Self-pay

## 2011-06-05 LAB — CBC
HCT: 40.8 % (ref 36.0–46.0)
Hemoglobin: 13.6 g/dL (ref 12.0–15.0)
MCH: 31.1 pg (ref 26.0–34.0)
MCHC: 33.3 g/dL (ref 30.0–36.0)
MCV: 93.4 fL (ref 78.0–100.0)
Platelets: 262 10*3/uL (ref 150–400)
RBC: 4.37 MIL/uL (ref 3.87–5.11)
RDW: 12.9 % (ref 11.5–15.5)
WBC: 5.9 10*3/uL (ref 4.0–10.5)

## 2011-06-05 LAB — URINALYSIS, ROUTINE W REFLEX MICROSCOPIC
Bilirubin Urine: NEGATIVE
Glucose, UA: NEGATIVE mg/dL
Hgb urine dipstick: NEGATIVE
Ketones, ur: NEGATIVE mg/dL
Leukocytes, UA: NEGATIVE
Nitrite: NEGATIVE
Protein, ur: NEGATIVE mg/dL
Specific Gravity, Urine: 1.015 (ref 1.005–1.030)
Urobilinogen, UA: 0.2 mg/dL (ref 0.0–1.0)
pH: 6 (ref 5.0–8.0)

## 2011-06-05 LAB — SURGICAL PCR SCREEN
MRSA, PCR: NEGATIVE
Staphylococcus aureus: NEGATIVE

## 2011-06-05 LAB — PROLACTIN: Prolactin: 12 ng/mL

## 2011-06-05 NOTE — Patient Instructions (Addendum)
19 Jamie Coleman  06/05/2011   Your procedure is scheduled on:  06/09/11  Enter through the Main Entrance of [facility] at 6 AM.  Pick up the phone at the desk and dial [phone].   Call this number if you have problems the morning of surgery: [phone]   Remember:   Do not eat food:After Midnight.  Do not drink clear liquids: After Midnight.  Take these medicines the morning of surgery with A SIP OF WATER: NA   Do not wear jewelry, make-up or nail polish.  Do not wear lotions, powders, or perfumes.   Do not shave 48 hours prior to surgery.  Do not bring valuables to the hospital.  Contacts, dentures or bridgework may not be worn into surgery.  Leave suitcase in the car. After surgery it may be brought to your room.  For patients admitted to the hospital, checkout time is 11:00 AM the day of discharge.   Patients discharged the day of surgery will not be allowed to drive home.  Name and phone number of your driver: see checklist  Special Instructions: CHG Shower Use Special Wash: 1/2 bottle night before surgery and 1/2 bottle morning of surgery.   Please read over the following fact sheets that you were given: MRSA Information

## 2011-06-08 NOTE — H&P (Signed)
Jamie Coleman, Jamie Coleman             ACCOUNT NO.:  [id number]  MEDICAL RECORD NO.:  [id number]  LOCATION:  [facility]                         FACILITY:  [facility]  PHYSICIAN:  Randye Lobo, M.D.   DATE OF BIRTH:  03/09/1971  DATE OF ADMISSION:  06/03/2011 DATE OF DISCHARGE:  06/03/2011                             HISTORY & PHYSICAL   CHIEF COMPLAINT:  Heavy and painful menstruation.  HISTORY OF PRESENT ILLNESS:  The patient is a 40 year old gravida 1, para 1-0-0-2 Caucasian female who presents with a history of heavy and painful menstruation.  The patient had a pelvic ultrasound performed on March 16, 2011, which documented an endometrial stripe of 10.5 mm.  There was a 0.58 cm echogenic area seen in the endometrium thought to be consistent with a possible polyp.  There was no evidence of any fibroids.  The bilateral ovaries were unremarkable.  Endometrial biopsy in the office on April 08, 2011, documented proliferative-type endometrium with breakdown.  The patient has used oral contraceptive pills in the past to control her painful and heavy menstruation, however, she developed chloasma and she declines further treatment with medical therapy.  The patient would like permanent sterilization.  PAST OBSTETRIC AND GYNECOLOGIC HISTORIES:  Status post C-section in 2000 for a twin gestation.  Last Pap smear was performed on February 02, 2011, and was within normal limits.  Last mammogram was performed February 04, 2011, and was within normal limits.  PAST MEDICAL HISTORY: 1. Hyperlipidemia controlled with fish oil and dietary change. 2. Gastroesophageal reflux disease. 3. Hyperprolactinemia in the past.  The patient's prolactin level has     been followed through the office and has been normal.  She will     have another prolactin level checked with her preoperative labs.  Situational anxiety.  PAST SURGICAL HISTORY: 1. Status post cesarean section in 2000. 2. Status post foot surgery in May  2008.  MEDICATIONS:  Protonix, multivitamin, fish oil, calcium with vitamin D, Xanax p.r.n.  ALLERGIES:  No known drug allergies.  SOCIAL HISTORY:  The patient is married.  She has a set of twins.  The patient works as a Engineer, civil (consulting).  She denies the use of tobacco.  She drinks 1 glass of alcohol per day.  She denies the use of illicit drugs.  FAMILY HISTORY:  Negative for breast, ovarian, uterine, or colon cancer. It is positive for appendiceal cancer. Positive for diabetes mellitus in the patient's father, paternal grandparents, maternal grandfather and the patient's brother.  Positive for hyperlipidemia in the patient's mother and father.  Positive for hypertension in the patient's father.  PHYSICAL EXAMINATION:  VITAL SIGNS:  Height 5 feet 2 inches, weight 187 pounds, blood pressure 110/64. HEENT:  Normocephalic, atraumatic. LUNGS:  Clear to auscultation bilaterally. HEART:  S1 and S2 with a regular rate and rhythm. ABDOMEN:  Soft and nontender and without evidence of hepatosplenomegaly or organomegaly.  There is a well-healed Pfannenstiel incision.  Pelvic examination, normal external genitalia and urethra.  The cervix and vagina demonstrate no lesions.  The uterus is small and nontender and there is no evidence of adnexal masses nor tenderness.  IMPRESSION:  The patient is a 40 year old gravida 1, para 2  female with menorrhagia and dysmenorrhea and a desire for permanent sterilization. Ultrasound has suggested a possible endometrial polyp.  PLAN:  The patient will undergo a hysteroscopy with dilation and curettage.  She will have a NovaSure endometrial ablation.  She will also have a laparoscopic bilateral tubal ligation with cautery.  Risks, benefits, and alternatives of the procedures have been reviewed with the patient who wishes to proceed.     Randye Lobo, M.D.     BES/MEDQ  D:  06/08/2011  T:  06/08/2011  Job:  [id number]

## 2011-06-09 ENCOUNTER — Encounter (HOSPITAL_COMMUNITY): Payer: Self-pay | Admitting: Anesthesiology

## 2011-06-09 ENCOUNTER — Other Ambulatory Visit: Payer: Self-pay | Admitting: Obstetrics and Gynecology

## 2011-06-09 ENCOUNTER — Ambulatory Visit (HOSPITAL_COMMUNITY): Payer: 59 | Admitting: Anesthesiology

## 2011-06-09 ENCOUNTER — Ambulatory Visit (HOSPITAL_COMMUNITY)
Admission: RE | Admit: 2011-06-09 | Discharge: 2011-06-09 | Disposition: A | Discharge status: Home / Self Care | Payer: 59 | Source: Ambulatory Visit | Attending: Obstetrics and Gynecology | Admitting: Obstetrics and Gynecology

## 2011-06-09 ENCOUNTER — Encounter (HOSPITAL_COMMUNITY)
Admission: RE | Disposition: A | Discharge status: Home / Self Care | Payer: Self-pay | Source: Ambulatory Visit | Attending: Obstetrics and Gynecology

## 2011-06-09 DIAGNOSIS — N920 Excessive and frequent menstruation with regular cycle: Secondary | ICD-10-CM | POA: Insufficient documentation

## 2011-06-09 DIAGNOSIS — N946 Dysmenorrhea, unspecified: Secondary | ICD-10-CM | POA: Insufficient documentation

## 2011-06-09 DIAGNOSIS — Z302 Encounter for sterilization: Secondary | ICD-10-CM | POA: Insufficient documentation

## 2011-06-09 DIAGNOSIS — Z01818 Encounter for other preprocedural examination: Secondary | ICD-10-CM | POA: Insufficient documentation

## 2011-06-09 DIAGNOSIS — Z01812 Encounter for preprocedural laboratory examination: Secondary | ICD-10-CM | POA: Insufficient documentation

## 2011-06-09 HISTORY — PX: LAPAROSCOPIC TUBAL LIGATION: SHX1937

## 2011-06-09 LAB — PREGNANCY, URINE: Preg Test, Ur: NEGATIVE

## 2011-06-09 SURGERY — LIGATION, FALLOPIAN TUBE, LAPAROSCOPIC
Anesthesia: General | Wound class: Clean

## 2011-06-09 MED ORDER — FENTANYL CITRATE 0.05 MG/ML IJ SOLN: 25.0000 ug | INTRAMUSCULAR | Status: DC | PRN

## 2011-06-09 MED ORDER — ONDANSETRON HCL 4 MG/2ML IJ SOLN
INTRAMUSCULAR | Status: AC
  Filled 2011-06-09: qty 2

## 2011-06-09 MED ORDER — ROCURONIUM BROMIDE 50 MG/5ML IV SOLN
INTRAVENOUS | Status: AC
  Filled 2011-06-09: qty 1

## 2011-06-09 MED ORDER — GLYCOPYRROLATE 0.2 MG/ML IJ SOLN
INTRAMUSCULAR | Status: DC | PRN
  Administered 2011-06-09: .8 mg via INTRAVENOUS

## 2011-06-09 MED ORDER — PROPOFOL 10 MG/ML IV EMUL
INTRAVENOUS | Status: DC | PRN
  Administered 2011-06-09: 200 mg via INTRAVENOUS
  Administered 2011-06-09 (×2): 100 mg via INTRAVENOUS

## 2011-06-09 MED ORDER — LIDOCAINE HCL 1 % IJ SOLN
INTRAMUSCULAR | Status: DC | PRN
  Administered 2011-06-09: 10 mL

## 2011-06-09 MED ORDER — FENTANYL CITRATE 0.05 MG/ML IJ SOLN
INTRAMUSCULAR | Status: AC
  Filled 2011-06-09: qty 5

## 2011-06-09 MED ORDER — LACTATED RINGERS IV SOLN
INTRAVENOUS | Status: DC
  Administered 2011-06-09: 06:00:00 via INTRAVENOUS

## 2011-06-09 MED ORDER — DEXAMETHASONE SODIUM PHOSPHATE 10 MG/ML IJ SOLN
INTRAMUSCULAR | Status: DC | PRN
  Administered 2011-06-09: 10 mg via INTRAVENOUS

## 2011-06-09 MED ORDER — FENTANYL CITRATE 0.05 MG/ML IJ SOLN
INTRAMUSCULAR | Status: DC | PRN
  Administered 2011-06-09: 50 ug via INTRAVENOUS
  Administered 2011-06-09: 100 ug via INTRAVENOUS
  Administered 2011-06-09 (×2): 50 ug via INTRAVENOUS

## 2011-06-09 MED ORDER — NEOSTIGMINE METHYLSULFATE 1 MG/ML IJ SOLN
INTRAMUSCULAR | Status: AC
  Filled 2011-06-09: qty 10

## 2011-06-09 MED ORDER — CEFAZOLIN SODIUM 1-5 GM-% IV SOLN
1.0000 g | INTRAVENOUS | Status: AC
  Administered 2011-06-09: 1 g via INTRAVENOUS

## 2011-06-09 MED ORDER — KETOROLAC TROMETHAMINE 30 MG/ML IJ SOLN
INTRAMUSCULAR | Status: AC
  Filled 2011-06-09: qty 1

## 2011-06-09 MED ORDER — ONDANSETRON HCL 4 MG/2ML IJ SOLN
INTRAMUSCULAR | Status: DC | PRN
  Administered 2011-06-09: 4 mg via INTRAVENOUS

## 2011-06-09 MED ORDER — GLYCOPYRROLATE 0.2 MG/ML IJ SOLN
INTRAMUSCULAR | Status: AC
  Filled 2011-06-09: qty 2

## 2011-06-09 MED ORDER — MIDAZOLAM HCL 5 MG/5ML IJ SOLN
INTRAMUSCULAR | Status: DC | PRN
  Administered 2011-06-09: 2 mg via INTRAVENOUS
  Administered 2011-06-09 (×2): 0.5 mg via INTRAVENOUS

## 2011-06-09 MED ORDER — SUCCINYLCHOLINE CHLORIDE 20 MG/ML IJ SOLN
INTRAMUSCULAR | Status: AC
  Filled 2011-06-09: qty 1

## 2011-06-09 MED ORDER — DEXAMETHASONE SODIUM PHOSPHATE 10 MG/ML IJ SOLN
INTRAMUSCULAR | Status: AC
  Filled 2011-06-09: qty 1

## 2011-06-09 MED ORDER — PROPOFOL 10 MG/ML IV EMUL
INTRAVENOUS | Status: AC
  Filled 2011-06-09: qty 50

## 2011-06-09 MED ORDER — BUPIVACAINE HCL (PF) 0.25 % IJ SOLN
INTRAMUSCULAR | Status: DC | PRN
  Administered 2011-06-09: 7 mL

## 2011-06-09 MED ORDER — HYDROMORPHONE HCL 1 MG/ML IJ SOLN
INTRAMUSCULAR | Status: AC
  Filled 2011-06-09: qty 1

## 2011-06-09 MED ORDER — MEPERIDINE HCL 25 MG/ML IJ SOLN: 6.2500 mg | INTRAMUSCULAR | Status: DC | PRN

## 2011-06-09 MED ORDER — LIDOCAINE HCL (CARDIAC) 20 MG/ML IV SOLN
INTRAVENOUS | Status: AC
  Filled 2011-06-09: qty 5

## 2011-06-09 MED ORDER — KETOROLAC TROMETHAMINE 30 MG/ML IJ SOLN
INTRAMUSCULAR | Status: DC | PRN
  Administered 2011-06-09: 30 mg via INTRAVENOUS

## 2011-06-09 MED ORDER — CEFAZOLIN SODIUM 1-5 GM-% IV SOLN
INTRAVENOUS | Status: AC
  Filled 2011-06-09: qty 50

## 2011-06-09 MED ORDER — KETOROLAC TROMETHAMINE 30 MG/ML IJ SOLN: 15.0000 mg | Freq: Once | INTRAMUSCULAR | Status: DC | PRN

## 2011-06-09 MED ORDER — ONDANSETRON HCL 4 MG/2ML IJ SOLN
INTRAMUSCULAR | Status: AC
  Administered 2011-06-09: 4 mg
  Filled 2011-06-09: qty 2

## 2011-06-09 MED ORDER — SUCCINYLCHOLINE CHLORIDE 20 MG/ML IJ SOLN
INTRAMUSCULAR | Status: DC | PRN
  Administered 2011-06-09 (×2): 100 mg via INTRAVENOUS

## 2011-06-09 MED ORDER — MIDAZOLAM HCL 2 MG/2ML IJ SOLN
INTRAMUSCULAR | Status: AC
  Filled 2011-06-09: qty 2

## 2011-06-09 MED ORDER — HYDROMORPHONE HCL 1 MG/ML IJ SOLN
INTRAMUSCULAR | Status: DC | PRN
  Administered 2011-06-09 (×2): 0.5 mg via INTRAVENOUS

## 2011-06-09 MED ORDER — NEOSTIGMINE METHYLSULFATE 1 MG/ML IJ SOLN
INTRAMUSCULAR | Status: DC | PRN
  Administered 2011-06-09: 5 mg via INTRAMUSCULAR

## 2011-06-09 MED ORDER — ONDANSETRON HCL 4 MG/2ML IJ SOLN: 4.0000 mg | Freq: Once | INTRAMUSCULAR | Status: DC | PRN

## 2011-06-09 MED ORDER — PROPOFOL 10 MG/ML IV EMUL
INTRAVENOUS | Status: AC
  Filled 2011-06-09: qty 20

## 2011-06-09 MED ORDER — LACTATED RINGERS IV SOLN
INTRAVENOUS | Status: DC | PRN
  Administered 2011-06-09: 1 via INTRAUTERINE

## 2011-06-09 MED ORDER — LIDOCAINE HCL (CARDIAC) 20 MG/ML IV SOLN
INTRAVENOUS | Status: DC | PRN
  Administered 2011-06-09: 100 mg via INTRAVENOUS

## 2011-06-09 MED ORDER — ROCURONIUM BROMIDE 100 MG/10ML IV SOLN
INTRAVENOUS | Status: DC | PRN
  Administered 2011-06-09: 20 mg via INTRAVENOUS
  Administered 2011-06-09: 10 mg via INTRAVENOUS

## 2011-06-09 SURGICAL SUPPLY — 21 items
ABLATOR ENDOMETRIAL BIPOLAR (ABLATOR) ×2 IMPLANT
BENZOIN TINCTURE PRP APPL 2/3 (GAUZE/BANDAGES/DRESSINGS) ×2 IMPLANT
CABLE HIGH FREQUENCY MONO STRZ (ELECTRODE) IMPLANT
CATH ROBINSON RED A/P 16FR (CATHETERS) ×2 IMPLANT
CLOTH BEACON ORANGE TIMEOUT ST (SAFETY) ×2 IMPLANT
CONTAINER PREFILL 10% NBF 60ML (FORM) ×4 IMPLANT
GLOVE BIO SURGEON STRL SZ 6.5 (GLOVE) ×6 IMPLANT
GLOVE BIO SURGEON STRL SZ8 (GLOVE) IMPLANT
GOWN PREVENTION PLUS LG XLONG (DISPOSABLE) ×4 IMPLANT
NEEDLE SPNL 22GX3.5 QUINCKE BK (NEEDLE) ×2 IMPLANT
PACK HYSTEROSCOPY LF (CUSTOM PROCEDURE TRAY) ×2 IMPLANT
PACK LAPAROSCOPY BASIN (CUSTOM PROCEDURE TRAY) ×2 IMPLANT
SCISSORS LAP 5X35 DISP (ENDOMECHANICALS) IMPLANT
SLEEVE SURGEON STRL (DRAPES) ×2 IMPLANT
STRIP CLOSURE SKIN 1/4X4 (GAUZE/BANDAGES/DRESSINGS) ×2 IMPLANT
SUT PLAIN 3 0 FS 2 27 (SUTURE) ×2 IMPLANT
SYR CONTROL 10ML LL (SYRINGE) ×2 IMPLANT
TOWEL OR 17X24 6PK STRL BLUE (TOWEL DISPOSABLE) ×4 IMPLANT
TRAY FOLEY CATH 14FR (SET/KITS/TRAYS/PACK) ×2 IMPLANT
WARMER LAPAROSCOPE (MISCELLANEOUS) ×2 IMPLANT
WATER STERILE IRR 1000ML POUR (IV SOLUTION) ×2 IMPLANT

## 2011-06-09 NOTE — Anesthesia Postprocedure Evaluation (Signed)
Anesthesia Post Note  Patient:  Jamie Coleman  Procedure(s) Performed:  LAPAROSCOPIC TUBAL LIGATION; DILATATION & CURETTAGE/HYSTEROSCOPY WITH NOVASURE ABLATION  Anesthesia type: General  Patient location: PACU  Post pain: Pain level controlled  Post assessment: Post-op Vital signs reviewed  Last Vitals:  Filed Vitals:   06/09/11 0608  BP: 139/93  Pulse: 124  Temp: 98.4 F (36.9 C)  Resp: 18    Post vital signs: Reviewed  Level of consciousness: sedated  Complications: No apparent anesthesia complicationsfj

## 2011-06-09 NOTE — Brief Op Note (Signed)
06/09/2011  8:33 AM  PATIENT:  Jamie Coleman  40 y.o. female  PRE-OPERATIVE DIAGNOSIS:  menorrhagia, desires sterilization, dysmenorrhea  POST-OPERATIVE DIAGNOSIS:  menorrhagia , desires sterilization, dysmenorrhea  PROCEDURE:  Procedure(s): LAPAROSCOPIC TUBAL LIGATION DILATATION & CURETTAGE/HYSTEROSCOPY WITH NOVASURE ABLATION  SURGEON:  Surgeon(s): [employer]  PHYSICIAN ASSISTANT:   ASSISTANTS: none   ANESTHESIA:   general  ESTIMATED BLOOD LOSS: 5 cc  BLOOD ADMINISTERED:none  DRAINS: none   LOCAL MEDICATIONS USED:  LIDOCAINE 10 CC  SPECIMEN:  Source of Specimen:  Endomoetrial curettings  DISPOSITION OF SPECIMEN:  PATHOLOGY  COUNTS:  YES  TOURNIQUET:    DICTATION #:   PLAN OF CARE: Discharge to home.  PATIENT DISPOSITION:  PACU - hemodynamically stable.   Delay start of Pharmacological VTE agent (>24hrs) due to surgical blood loss or risk of bleeding:  not applicable

## 2011-06-09 NOTE — Transfer of Care (Signed)
  Anesthesia Post-op Note  Patient: Jamie Coleman  Procedure(s) Performed:  LAPAROSCOPIC TUBAL LIGATION; DILATATION & CURETTAGE/HYSTEROSCOPY WITH NOVASURE ABLATION  Patient Location: PACU  Anesthesia Type: General  Level of Consciousness: awake, alert  and oriented  Airway and Oxygen Therapy: Patient Spontanous Breathing and Patient connected to nasal cannula oxygen  Post-op Pain: mild  Post-op Assessment: Post-op Vital signs reviewed and Patient's Cardiovascular Status Stable  Post-op Vital Signs: Reviewed and stable  Complications: No apparent anesthesia complications

## 2011-06-09 NOTE — Progress Notes (Signed)
Pre-op Note  No marked change in status.  OK to proceed.

## 2011-06-09 NOTE — Anesthesia Preprocedure Evaluation (Signed)
Anesthesia Evaluation  Name, MR# and DOB Patient awake  General Assessment Comment  Reviewed: Allergy & Precautions, H&P , Patient's Chart, lab work & pertinent test results, reviewed documented beta blocker date and time   History of Anesthesia Complications Negative for: history of anesthetic complications  Airway Mallampati: II TM Distance: >3 FB Neck ROM: full    Dental No notable dental hx.    Pulmonary  clear to auscultation  pulmonary exam normalPulmonary Exam Normal breath sounds clear to auscultation none    Cardiovascular Exercise Tolerance: Good regular Normal    Neuro/Psych Negative Neurological ROS  Negative Psych ROS  GI/Hepatic/Renal negative GI ROS  negative Liver ROS  negative Renal ROS   GERD Controlled     Endo/Other  Negative Endocrine ROS (+)      Abdominal   Musculoskeletal   Hematology negative hematology ROS (+)   Peds  Reproductive/Obstetrics negative OB ROS    Anesthesia Other Findings             Anesthesia Physical Anesthesia Plan  ASA: II  Anesthesia Plan: General   Post-op Pain Management:    Induction:   Airway Management Planned: Oral ETT  Additional Equipment:   Intra-op Plan:   Post-operative Plan:   Informed Consent: I have reviewed the patients History and Physical, chart, labs and discussed the procedure including the risks, benefits and alternatives for the proposed anesthesia with the patient or authorized representative who has indicated his/her understanding and acceptance.   Dental Advisory Given  Plan Discussed with: CRNA and Surgeon  Anesthesia Plan Comments:         Anesthesia Quick Evaluation

## 2011-06-09 NOTE — H&P (Signed)
NAME:  Jamie Coleman, Jamie Coleman             ACCOUNT NO.:  [id number]  MEDICAL RECORD NO.:  [id number]  LOCATION:  [facility]                         FACILITY:  [facility]  PHYSICIAN:  Demere Dotzler E. Coleman, M.D.   DATE OF BIRTH:  08/29/1971  DATE OF ADMISSION:  06/03/2011 DATE OF DISCHARGE:  06/03/2011                             HISTORY & PHYSICAL   CHIEF COMPLAINT:  Heavy and painful menstruation.  HISTORY OF PRESENT ILLNESS:  The patient is a 40-year-old gravida 1, para 1-0-0-2 Caucasian female who presents with a history of heavy and painful menstruation.  The patient had a pelvic ultrasound performed on March 16, 2011, which documented an endometrial stripe of 10.5 mm.  There was a 0.58 cm echogenic area seen in the endometrium thought to be consistent with a possible polyp.  There was no evidence of any fibroids.  The bilateral ovaries were unremarkable.  Endometrial biopsy in the office on April 08, 2011, documented proliferative-type endometrium with breakdown.  The patient has used oral contraceptive pills in the past to control her painful and heavy menstruation, however, she developed chloasma and she declines further treatment with medical therapy.  The patient would like permanent sterilization.  PAST OBSTETRIC AND GYNECOLOGIC HISTORIES:  Status post C-section in 2000 for a twin gestation.  Last Pap smear was performed on February 02, 2011, and was within normal limits.  Last mammogram was performed February 04, 2011, and was within normal limits.  PAST MEDICAL HISTORY: 1. Hyperlipidemia controlled with fish oil and dietary change. 2. Gastroesophageal reflux disease. 3. Hyperprolactinemia in the past.  The patient's prolactin level has     been followed through the office and has been normal.  She will     have another prolactin level checked with her preoperative labs.  Situational anxiety.  PAST SURGICAL HISTORY: 1. Status post cesarean section in 2000. 2. Status post foot surgery in May  2008.  MEDICATIONS:  Protonix, multivitamin, fish oil, calcium with vitamin D, Xanax p.r.n.  ALLERGIES:  No known drug allergies.  SOCIAL HISTORY:  The patient is married.  She has a set of twins.  The patient works as a nurse.  She denies the use of tobacco.  She drinks 1 glass of alcohol per day.  She denies the use of illicit drugs.  FAMILY HISTORY:  Negative for breast, ovarian, uterine, or colon cancer. It is positive for appendiceal cancer. Positive for diabetes mellitus in the patient's father, paternal grandparents, maternal grandfather and the patient's brother.  Positive for hyperlipidemia in the patient's mother and father.  Positive for hypertension in the patient's father.  PHYSICAL EXAMINATION:  VITAL SIGNS:  Height 5 feet 2 inches, weight 187 pounds, blood pressure 110/64. HEENT:  Normocephalic, atraumatic. LUNGS:  Clear to auscultation bilaterally. HEART:  S1 and S2 with a regular rate and rhythm. ABDOMEN:  Soft and nontender and without evidence of hepatosplenomegaly or organomegaly.  There is a well-healed Pfannenstiel incision.  Pelvic examination, normal external genitalia and urethra.  The cervix and vagina demonstrate no lesions.  The uterus is small and nontender and there is no evidence of adnexal masses nor tenderness.  IMPRESSION:  The patient is a 40-year-old gravida 1, para 2   female with menorrhagia and dysmenorrhea and a desire for permanent sterilization. Ultrasound has suggested a possible endometrial polyp.  PLAN:  The patient will undergo a hysteroscopy with dilation and curettage.  She will have a NovaSure endometrial ablation.  She will also have a laparoscopic bilateral tubal ligation with cautery.  Risks, benefits, and alternatives of the procedures have been reviewed with the patient who wishes to proceed.     Jamie Coleman, M.D.     BES/MEDQ  D:  06/08/2011  T:  06/08/2011  Job:  [id number] 

## 2011-06-09 NOTE — Anesthesia Procedure Notes (Addendum)
Procedure Name: Intubation Date/Time: 06/09/2011 7:42 AM Performed by: Karleen Dolphin Pre-anesthesia Checklist: Patient identified, Emergency Drugs available, Suction available, Timeout performed and Patient being monitored Patient Re-evaluated:Patient Re-evaluated prior to inductionOxygen Delivery Method: Circle System Utilized Preoxygenation: Pre-oxygenation with 100% oxygen Intubation Type: IV induction and Inhalational induction Ventilation: Mask ventilation without difficulty Laryngoscope Size: Mac and 3 Grade View: Grade III Tube type: Oral Tube size: 7.0 mm Airway Equipment and Method: video-laryngoscopy and stylet Placement Confirmation: ETT inserted through vocal cords under direct vision,  positive ETCO2 and breath sounds checked- equal and bilateral Secured at: 22 cm Tube secured with: Tape Dental Injury: Teeth and Oropharynx as per pre-operative assessment  Difficulty Due To: Difficulty was unanticipated   Performed by: Karleen Dolphin

## 2011-06-09 NOTE — Op Note (Signed)
Jamie Coleman, Jamie Coleman             ACCOUNT NO.:  [id number]  MEDICAL RECORD NO.:  [id number]  LOCATION:  [facility]                          FACILITY:  [facility]  PHYSICIAN:  Randye Lobo, M.D.   DATE OF BIRTH:  [birth year]  DATE OF PROCEDURE:  06/09/2011 DATE OF DISCHARGE:                              OPERATIVE REPORT   PREOPERATIVE DIAGNOSES: 1. Menorrhagia. 2. Dysmenorrhea. 3. Possible endometrial polyp. 4. Desire for permanent sterilization.  POSTOPERATIVE DIAGNOSES: 1. Menorrhagia. 2. Dysmenorrhea. 3. Desire for permanent sterilization.  PROCEDURE:  A hysteroscopy with dilation and curettage, NovaSure endometrial ablation, laparoscopic bilateral tubal ligation with bipolar cautery.  SURGEON:  Randye Lobo, MD  ANESTHESIA:  General endotracheal, paracervical block with 1% lidocaine. Local 0.25% Marcaine IV fluids, 1400 mL Ringer's lactate.  ESTIMATED BLOOD LOSS:  Minimal.  URINE OUTPUT:  700 mL.  COMPLICATIONS:  None.  INDICATIONS FOR PROCEDURE:  The patient is a 40 year old gravida 1, para 2 Caucasian female who presents with a history of dysmenorrhea and menorrhagia.  The patient has been treated with oral contraceptives in the past, however, this caused melasma.  The patient has had a pelvic ultrasound documenting a possible endometrial polyp approximately 1 cm in size.  An office endometrial biopsy was benign.  The patient declines any future childbearing, and a plan is therefore made to proceed with a hysteroscopy, dilation and curettage, possible endometrial polypectomy, and a laparoscopic bilateral tubal ligation.  Risks, benefits, and alternatives have been reviewed with the patient who wishes to proceed. The patient understands that the tubal ligation is a permanent procedure, however, it does fail approximately one in 250 to one in 300 times, which may result in either an intrauterine or an ectopic pregnancy.  FINDINGS:  Exam under anesthesia revealed a small  anteverted mobile uterus.  No adnexal masses were appreciated.  Hysteroscopy demonstrated a normal endometrial cavity.  There was no evidence of any polyps or fibroids.  A small amount of endometrial curettings were obtained.  Laparoscopy demonstrated a normal uterus, tubes, and ovaries.  There is no evidence of endometriosis in the abdomen nor the pelvis.  The appendix was normal.  The liver and gallbladder were unremarkable.  SPECIMENS:  Endometrial curettings were sent to pathology.  PROCEDURE:  The patient was reidentified in the preoperative hold area. She received Ancef IV for antibiotic prophylaxis.  She received PAS stockings for DVT prophylaxis.  In the operating room, general endotracheal anesthesia was induced with a GlideScope after the patient had a difficult intubation due to her anatomy.  This was successful.  The patient was then placed in the dorsal lithotomy position.  The lower abdomen, vagina and perineum were sterilely prepped and draped.  A Foley catheter was placed inside the bladder.  A speculum was placed inside the vagina.  A single-tooth tenaculum was placed on the anterior cervical lip.  The endocervix was measured at a length of 3 cm.  The uterine cavity was sounded to 9 cm.  The cervix was then dilated with Shawnie Pons dilators to accommodate the diagnostic hysteroscope.  This was performed under the continuous infusion of lactated Ringer's solution. The findings are as noted above.  The hysteroscope was removed and  the endometrium was curetted with a sharp curette.  This specimen was sent to pathology.  The cervix was then further dilated with the [facility] dilators to accommodate the NovaSure device.  The device was placed inside the uterine fundus and withdrawn slightly.  It was locked into place.  The NovaSure device was seated and the maneuvers were performed in order to define the cavity with a 3.5 cm.  The CO2 test was performed and the patient  passed the test.  The NovaSure device was then used over 1 minute 6 seconds of burn time at a power of 116.  The device was then withdrawn and a Hulka tenaculum was placed inside the uterine cavity.  The remaining vaginal instruments were removed.  Attention was turned to the abdomen where a 1-cm umbilical incision was created sharply with a scalpel.  Dissection was performed down to the fascia with an Allis clamp.  A 10-mm laparoscope was inserted directly into the peritoneal cavity without difficulty.  The laparoscope confirmed proper placement.  A CO2 pneumoperitoneum was achieved and the patient was placed in the Trendelenburg position.  1 cm suprapubic midline incision was created over the patient's previous Pfannenstiel incision.  A 5-mm trocar was placed under direct visualization of the laparoscope.  An inspection of the pelvic and abdominal organs was performed of the findings are as noted above.  The Kleppinger forceps was then used to perform the tubal ligation.  The right fallopian tube was grasped along the isthmic portion and followed to its fimbriated end.  It was burned over 3 cm of contiguous tissue. The left fallopian tube was then grasped and followed to its fimbriated end and was similarly burned over 3 cm of tissue.  The procedure was concluded at this time.  The lower abdominal trocar was removed under visualization of the laparoscope.  The CO2 pneumoperitoneum was released and the laparoscope and the umbilical trocar were removed simultaneously.  The skin incisions were closed with subcuticular sutures of 3-0 plain gut suture.  The skin was then closed with Dermabond on top of this. The Hulka tenaculum and Foley catheter were removed.  The patient was awakened and extubated and escorted to the recovery room in stable condition.  There were no complications to the procedure.  All needle, instrument, sponge counts were correct.     Randye Lobo,  M.D.     BES/MEDQ  D:  06/09/2011  T:  06/09/2011  Job:  [id number]

## 2011-06-12 ENCOUNTER — Encounter: Payer: 59 | Admitting: Physical Therapy

## 2011-06-17 ENCOUNTER — Ambulatory Visit (INDEPENDENT_AMBULATORY_CARE_PROVIDER_SITE_OTHER): Payer: 59 | Admitting: Family Medicine

## 2011-06-17 ENCOUNTER — Encounter: Payer: Self-pay | Admitting: Family Medicine

## 2011-06-17 VITALS — BP 106/68 | HR 72 | Temp 98.5°F | Ht 62.0 in | Wt 187.8 lb

## 2011-06-17 DIAGNOSIS — M25519 Pain in unspecified shoulder: Secondary | ICD-10-CM

## 2011-06-17 DIAGNOSIS — M25511 Pain in right shoulder: Secondary | ICD-10-CM

## 2011-06-17 NOTE — Progress Notes (Signed)
Subjective:    Patient ID: Jamie Coleman, female    DOB: 09-07-1971, 40 y.o.   MRN: [id number]  HPI Is here for f/u of shoulder pain  Was dx with rotator cuff tendonopathy by Dr Patsy Lager  Given rehab exercises  Ref to PT  No inj done  ? Did not find impingement   Is overall much better -- stretching has helped the most and icing has made the biggest difference  PT for 2 sessions - taught her the exercises Still twinge - but mostly gone  Continuing to do that  Otherwise doing great   No meds  Did not need an injection   Had surgery- ablation and BTL Had trouble with intubation - will not that for any future surgeries   Patient Active Problem List  Diagnoses  . DERMATOFIBROMA, ARM  . NEOPLASM, SKIN, UNCERTAIN BEHAVIOR  . HYPERLIPIDEMIA  . ANXIETY, SITUATIONAL  . ALLERGIC RHINITIS  . OTHER&UNSPECIFIED DISEASES THE ORAL SOFT TISSUES  . GERD  . IBS  . POSTPARTUM DEPRESSION  . Diarrhea  . Tachycardia  . URI (upper respiratory infection)  . Dizziness  . Shoulder pain, right  . Impingement syndrome of right shoulder   Past Medical History  Diagnosis Date  . Allergy     allergic rhinitis  . GERD (gastroesophageal reflux disease)   . Hyperlipidemia   . Anxiety     situational  . Status post endometrial ablation    Past Surgical History  Procedure Date  . Cesarean section   . Foot surgery   . Tonsillectomy     as child  . Endometrial ablation     8/12  . Tubal ligation 8/12   History  Substance Use Topics  . Smoking status: Never Smoker   . Smokeless tobacco: Never Used  . Alcohol Use: Yes     occasionally   Family History  Problem Relation Age of Onset  . Cancer Mother     CA insitu of appendix  . Heart disease Father     CAD  . Diabetes Father     type II  . Diabetes Brother     type II   No Known Allergies Current Outpatient Prescriptions on File Prior to Visit  Medication Sig Dispense Refill  . Calcium Carbonate-Vit D-Min 600-400 MG-UNIT  TABS Take 1 tablet by mouth 2 (two) times daily.       . Multiple Vitamin (MULTIVITAMIN) capsule Take 1 capsule by mouth daily.        . Omega-3 Fatty Acids (FISH OIL PO) Take by mouth daily.        . pantoprazole (PROTONIX) 40 MG tablet Take 1 tablet (40 mg total) by mouth daily. as needed.  30 tablet  11  . fexofenadine (ALLEGRA) 180 MG tablet Take 180 mg by mouth daily. As needed.       . meclizine (ANTIVERT) 25 MG tablet Take 1 tablet (25 mg total) by mouth 3 (three) times daily as needed for dizziness or nausea.  30 tablet  1      Review of Systems Review of Systems  Constitutional: Negative for fever, appetite change, fatigue and unexpected weight change.  Eyes: Negative for pain and visual disturbance.  Respiratory: Negative for cough and shortness of breath.   Cardiovascular: Negative for cp or palpitations    Gastrointestinal: Negative for nausea, diarrhea and constipation.  Genitourinary: Negative for urgency and frequency.  Skin: Negative for pallor or rash   MSK pos for mild shoulder pain -  improved with no other acute joint changes  Neurological: Negative for weaknss, light-headedness, numbness and headaches.  Hematological: Negative for adenopathy. Does not bruise/bleed easily.  Psychiatric/Behavioral: Negative for dysphoric mood. The patient is not nervous/anxious.          Objective:   Physical Exam  Constitutional: She appears well-developed and well-nourished. No distress.       overwt and well appearing   HENT:  Head: Normocephalic and atraumatic.  Neck: Normal range of motion. Neck supple. No JVD present. No thyromegaly present.       No bony tenderness  Cardiovascular: Normal rate, regular rhythm and normal heart sounds.   Pulmonary/Chest: Breath sounds normal. No respiratory distress. She has no wheezes.  Musculoskeletal: She exhibits tenderness. She exhibits no edema.       R shoulder- improved rom with no crepitice  Nl abduction  Some pain on int  rotation  Very mild acromion tenderness  Lymphadenopathy:    She has no cervical adenopathy.  Neurological: She is alert. She has normal strength and normal reflexes. No sensory deficit. Coordination normal.  Skin: Skin is warm and dry. No rash noted. No erythema. No pallor.  Psychiatric: She has a normal mood and affect.          Assessment & Plan:

## 2011-06-17 NOTE — Patient Instructions (Signed)
I'm glad your shoulder is doing better ! Continue your stretches and exercises Update [place] if symptoms worsen again

## 2011-06-17 NOTE — Assessment & Plan Note (Signed)
Much improved after PT and also stretches and ice Almost back to 100% Thankful she did not need injection  Will continue current program and update [place] if symptoms worsen or re occur

## 2011-06-23 ENCOUNTER — Encounter (HOSPITAL_COMMUNITY): Payer: Self-pay | Admitting: Obstetrics and Gynecology

## 2011-08-04 ENCOUNTER — Other Ambulatory Visit (INDEPENDENT_AMBULATORY_CARE_PROVIDER_SITE_OTHER): Payer: 59

## 2011-08-04 DIAGNOSIS — E785 Hyperlipidemia, unspecified: Secondary | ICD-10-CM

## 2011-08-04 LAB — LDL CHOLESTEROL, DIRECT: Direct LDL: 147.6 mg/dL

## 2011-08-04 LAB — LIPID PANEL
Cholesterol: 245 mg/dL — ABNORMAL HIGH (ref 0–200)
HDL: 57.7 mg/dL
Total CHOL/HDL Ratio: 4
Triglycerides: 306 mg/dL — ABNORMAL HIGH (ref 0.0–149.0)
VLDL: 61.2 mg/dL — ABNORMAL HIGH (ref 0.0–40.0)

## 2011-08-04 LAB — HEPATIC FUNCTION PANEL
ALT: 20 U/L (ref 0–35)
AST: 20 U/L (ref 0–37)
Albumin: 4.1 g/dL (ref 3.5–5.2)
Alkaline Phosphatase: 59 U/L (ref 39–117)
Bilirubin, Direct: 0 mg/dL (ref 0.0–0.3)
Total Bilirubin: 0.4 mg/dL (ref 0.3–1.2)
Total Protein: 7 g/dL (ref 6.0–8.3)

## 2011-10-16 ENCOUNTER — Encounter: Payer: Self-pay | Admitting: Family Medicine

## 2011-10-16 ENCOUNTER — Ambulatory Visit (INDEPENDENT_AMBULATORY_CARE_PROVIDER_SITE_OTHER): Payer: 59 | Admitting: Family Medicine

## 2011-10-16 VITALS — BP 110/72 | HR 80 | Temp 99.2°F | Ht 62.0 in | Wt 187.2 lb

## 2011-10-16 DIAGNOSIS — T148XXA Other injury of unspecified body region, initial encounter: Secondary | ICD-10-CM

## 2011-10-16 LAB — APTT: aPTT: 24.3 s (ref 21.7–28.8)

## 2011-10-16 LAB — CBC WITH DIFFERENTIAL/PLATELET
Basophils Absolute: 0 10*3/uL (ref 0.0–0.1)
Basophils Relative: 0.3 % (ref 0.0–3.0)
Eosinophils Absolute: 0.1 10*3/uL (ref 0.0–0.7)
Eosinophils Relative: 0.8 % (ref 0.0–5.0)
HCT: 38.8 % (ref 36.0–46.0)
Hemoglobin: 13.5 g/dL (ref 12.0–15.0)
Lymphocytes Relative: 23.7 % (ref 12.0–46.0)
Lymphs Abs: 1.5 10*3/uL (ref 0.7–4.0)
MCHC: 34.7 g/dL (ref 30.0–36.0)
MCV: 93.7 fl (ref 78.0–100.0)
Monocytes Absolute: 0.5 10*3/uL (ref 0.1–1.0)
Monocytes Relative: 7.8 % (ref 3.0–12.0)
Neutro Abs: 4.2 10*3/uL (ref 1.4–7.7)
Neutrophils Relative %: 67.4 % (ref 43.0–77.0)
Platelets: 221 10*3/uL (ref 150.0–400.0)
RBC: 4.15 Mil/uL (ref 3.87–5.11)
RDW: 12.7 % (ref 11.5–14.6)
WBC: 6.2 10*3/uL (ref 4.5–10.5)

## 2011-10-16 LAB — PROTIME-INR
INR: 1 ratio (ref 0.8–1.0)
Prothrombin Time: 10.5 s (ref 10.2–12.4)

## 2011-10-16 NOTE — Patient Instructions (Signed)
I suspect the easy bruising may have to do with fish oil Stop it for a while and see if there is any difference  Labs today - will update you  Call if worse or any changes

## 2011-10-16 NOTE — Progress Notes (Signed)
Subjective:    Patient ID: Jamie Coleman, female    DOB: Jun 07, 1971, 41 y.o.   MRN: [id number]  HPI Here for some new bruising  First noticed in April on extremities  (after "high fiving" child-- felt a pop and had a big bruise on hand) Now big bruises at random with no reason  Seldom does she remember trauma This keeps happening   New years eve - big bruise on finger after just washing hands Does not bleed easily  No fam hx of heme problems  No asa  Takes fish oil   No swollen LN  And feeling fine   Lab Results  Component Value Date   WBC 5.9 06/05/2011   HGB 13.6 06/05/2011   HCT 40.8 06/05/2011   MCV 93.4 06/05/2011   PLT 262 06/05/2011    Patient Active Problem List  Diagnoses  . DERMATOFIBROMA, ARM  . NEOPLASM, SKIN, UNCERTAIN BEHAVIOR  . HYPERLIPIDEMIA  . ANXIETY, SITUATIONAL  . ALLERGIC RHINITIS  . OTHER&UNSPECIFIED DISEASES THE ORAL SOFT TISSUES  . GERD  . IBS  . POSTPARTUM DEPRESSION  . Diarrhea  . Tachycardia  . URI (upper respiratory infection)  . Dizziness  . Shoulder pain, right  . Impingement syndrome of right shoulder  . Bruising   Past Medical History  Diagnosis Date  . Allergy     allergic rhinitis  . GERD (gastroesophageal reflux disease)   . Hyperlipidemia   . Anxiety     situational  . Status post endometrial ablation    Past Surgical History  Procedure Date  . Cesarean section   . Foot surgery   . Tonsillectomy     as child  . Endometrial ablation     8/12  . Tubal ligation 8/12  . Laparoscopic tubal ligation 06/09/2011    Procedure: LAPAROSCOPIC TUBAL LIGATION;  Surgeon: Melony Overly;  Location: [facility];  Service: Gynecology;  Laterality: N/A;   History  Substance Use Topics  . Smoking status: Never Smoker   . Smokeless tobacco: Never Used  . Alcohol Use: Yes     occasionally   Family History  Problem Relation Age of Onset  . Cancer Mother     CA insitu of appendix  . Heart disease Father     CAD  . Diabetes Father      type II  . Diabetes Brother     type II   No Known Allergies Current Outpatient Prescriptions on File Prior to Visit  Medication Sig Dispense Refill  . Calcium Carbonate-Vit D-Min 600-400 MG-UNIT TABS Take 1 tablet by mouth 2 (two) times daily.       . Multiple Vitamin (MULTIVITAMIN) capsule Take 1 capsule by mouth daily.        . Omega-3 Fatty Acids (FISH OIL PO) Take by mouth daily.        . pantoprazole (PROTONIX) 40 MG tablet Take 1 tablet (40 mg total) by mouth daily. as needed.  30 tablet  11  . fexofenadine (ALLEGRA) 180 MG tablet Take 180 mg by mouth daily. As needed.       . meclizine (ANTIVERT) 25 MG tablet Take 1 tablet (25 mg total) by mouth 3 (three) times daily as needed for dizziness or nausea.  30 tablet  1     Review of Systems Review of Systems  Constitutional: Negative for fever, appetite change, fatigue and unexpected weight change.  Eyes: Negative for pain and visual disturbance.  Respiratory: Negative for cough and shortness of  breath.   Cardiovascular: Negative for cp or palpitations    Gastrointestinal: Negative for nausea, diarrhea and constipation.  Genitourinary: Negative for urgency and frequency.  Skin: Negative for pallor or rash   Neurological: Negative for weakness, light-headedness, numbness and headaches.  Hematological: Negative for adenopathy. Does not bleed easily , no fatigue or fever at home  Psychiatric/Behavioral: Negative for dysphoric mood. The patient is not nervous/anxious.          Objective:   Physical Exam  Constitutional: She appears well-developed and well-nourished. No distress.       overwt and well appearing   HENT:  Head: Normocephalic and atraumatic.  Mouth/Throat: Oropharynx is clear and moist.  Eyes: Conjunctivae and EOM are normal. Pupils are equal, round, and reactive to light. No scleral icterus.  Neck: Normal range of motion. Neck supple. No thyromegaly present.  Cardiovascular: Normal rate, regular rhythm,  normal heart sounds and intact distal pulses.   Pulmonary/Chest: Effort normal and breath sounds normal. No respiratory distress. She has no wheezes.  Abdominal: Soft. Bowel sounds are normal. She exhibits no distension and no mass. There is no hepatosplenomegaly. There is no tenderness. There is no rebound and no guarding.  Musculoskeletal: She exhibits no edema and no tenderness.  Lymphadenopathy:    She has no cervical adenopathy.  Neurological: She is alert. She has normal reflexes. She exhibits normal muscle tone.  Skin: Skin is warm and dry. No rash noted. No erythema. No pallor.       Old ecchymosis with knot on L anterior forearm No other bruising seen Veins are superficial and easy to see with fair skin          Assessment & Plan:

## 2011-10-16 NOTE — Assessment & Plan Note (Signed)
Easy bruising - some without trauma  Suspect fish oil may be having a mild anticoag effect in pt with fair skin/ female Cbc and coags today - suspect nl  Then update Pt plans to hold fish oil to see if this improves Reassuring exam inst to call if worse

## 2012-01-19 ENCOUNTER — Other Ambulatory Visit: Payer: Self-pay | Admitting: Family Medicine

## 2012-01-19 DIAGNOSIS — R7309 Other abnormal glucose: Secondary | ICD-10-CM

## 2012-01-19 DIAGNOSIS — E7800 Pure hypercholesterolemia, unspecified: Secondary | ICD-10-CM

## 2012-01-25 ENCOUNTER — Other Ambulatory Visit: Payer: 59

## 2012-01-28 ENCOUNTER — Other Ambulatory Visit (INDEPENDENT_AMBULATORY_CARE_PROVIDER_SITE_OTHER): Payer: 59

## 2012-01-28 DIAGNOSIS — R7309 Other abnormal glucose: Secondary | ICD-10-CM

## 2012-01-28 DIAGNOSIS — E7800 Pure hypercholesterolemia, unspecified: Secondary | ICD-10-CM

## 2012-01-28 LAB — BASIC METABOLIC PANEL
BUN: 14 mg/dL (ref 6–23)
CO2: 25 mEq/L (ref 19–32)
Calcium: 9.3 mg/dL (ref 8.4–10.5)
Chloride: 106 mEq/L (ref 96–112)
Creatinine, Ser: 0.9 mg/dL (ref 0.4–1.2)
GFR: 71.09 mL/min (ref >60.00)
Glucose, Bld: 89 mg/dL (ref 70–99)
Potassium: 4.9 mEq/L (ref 3.5–5.1)
Sodium: 139 mEq/L (ref 135–145)

## 2012-01-28 LAB — LDL CHOLESTEROL, DIRECT: Direct LDL: 192.9 mg/dL

## 2012-01-28 LAB — AST: AST: 27 U/L (ref 0–37)

## 2012-01-28 LAB — LIPID PANEL
Cholesterol: 271 mg/dL — ABNORMAL HIGH (ref 0–200)
HDL: 65.6 mg/dL (ref >39.00)
Total CHOL/HDL Ratio: 4
Triglycerides: 124 mg/dL (ref 0.0–149.0)
VLDL: 24.8 mg/dL (ref 0.0–40.0)

## 2012-01-28 LAB — ALT: ALT: 27 U/L (ref 0–35)

## 2012-01-28 LAB — HEMOGLOBIN A1C: Hgb A1c MFr Bld: 5.6 % (ref 4.6–6.5)

## 2012-02-01 ENCOUNTER — Encounter: Payer: Self-pay | Admitting: Family Medicine

## 2012-02-01 ENCOUNTER — Ambulatory Visit (INDEPENDENT_AMBULATORY_CARE_PROVIDER_SITE_OTHER): Payer: 59 | Admitting: Family Medicine

## 2012-02-01 VITALS — BP 120/70 | HR 78 | Temp 97.6°F | Ht 62.0 in | Wt 194.2 lb

## 2012-02-01 DIAGNOSIS — E785 Hyperlipidemia, unspecified: Secondary | ICD-10-CM

## 2012-02-01 NOTE — Progress Notes (Signed)
Subjective:    Patient ID: Jamie Coleman, female    DOB: [birth year], 41 y.o.   MRN: [id number]  HPI Here for f/u of hyperlipidemia   Was high in summer- brought it down and now high again Lab Results  Component Value Date   CHOL 271* 01/28/2012   CHOL 245* 08/04/2011   Lab Results  Component Value Date   HDL 65.60 01/28/2012   HDL 40.98 08/04/2011   No results found for this basename: [facility]   Lab Results  Component Value Date   TRIG 124.0 01/28/2012   TRIG 306.0* 08/04/2011   Lab Results  Component Value Date   CHOLHDL 4 01/28/2012   CHOLHDL 4 08/04/2011   Lab Results  Component Value Date   LDLDIRECT 192.9 01/28/2012   LDLDIRECT 147.6 08/04/2011    Diet-not really changed much at all - no reason for the inc The week before - ate a lot of saturated fats  Before that had not had any red meat or breakfast sausage  Is exercising -- elliptical 5 days per week    HDL went up , and trig went down  Wt is up 7 lb with bmi of 35  Family- high cholesterol but no heart disease   Patient Active Problem List  Diagnoses  . DERMATOFIBROMA, ARM  . NEOPLASM, SKIN, UNCERTAIN BEHAVIOR  . HYPERLIPIDEMIA  . ANXIETY, SITUATIONAL  . ALLERGIC RHINITIS  . OTHER&UNSPECIFIED DISEASES THE ORAL SOFT TISSUES  . GERD  . IBS  . POSTPARTUM DEPRESSION  . Diarrhea  . Tachycardia  . URI (upper respiratory infection)  . Dizziness  . Shoulder pain, right  . Impingement syndrome of right shoulder  . Bruising   Past Medical History  Diagnosis Date  . Allergy     allergic rhinitis  . GERD (gastroesophageal reflux disease)   . Hyperlipidemia   . Anxiety     situational  . Status post endometrial ablation    Past Surgical History  Procedure Date  . Cesarean section   . Foot surgery   . Tonsillectomy     as child  . Endometrial ablation     8/12  . Tubal ligation 8/12  . Laparoscopic tubal ligation 06/09/2011    Procedure: LAPAROSCOPIC TUBAL LIGATION;  Surgeon: Melony Overly;   Location: [facility];  Service: Gynecology;  Laterality: N/A;   History  Substance Use Topics  . Smoking status: Never Smoker   . Smokeless tobacco: Never Used  . Alcohol Use: Yes     occasionally   Family History  Problem Relation Age of Onset  . Cancer Mother     CA insitu of appendix  . Heart disease Father     CAD  . Diabetes Father     type II  . Diabetes Brother     type II   No Known Allergies Current Outpatient Prescriptions on File Prior to Visit  Medication Sig Dispense Refill  . Calcium Carbonate-Vit D-Min 600-400 MG-UNIT TABS Take 1 tablet by mouth 2 (two) times daily.       . fexofenadine (ALLEGRA) 180 MG tablet Take 180 mg by mouth daily. As needed.       . Multiple Vitamin (MULTIVITAMIN) capsule Take 1 capsule by mouth daily.        . Omega-3 Fatty Acids (FISH OIL PO) Take by mouth daily.        . pantoprazole (PROTONIX) 40 MG tablet Take 1 tablet (40 mg total) by mouth daily. as needed.  30  tablet  11      Review of Systems Review of Systems  Constitutional: Negative for fever, appetite change, fatigue and unexpected weight change.  Eyes: Negative for pain and visual disturbance.  Respiratory: Negative for cough and shortness of breath.   Cardiovascular: Negative for cp or palpitations    Gastrointestinal: Negative for nausea, diarrhea and constipation.  Genitourinary: Negative for urgency and frequency.  Skin: Negative for pallor or rash   Neurological: Negative for weakness, light-headedness, numbness and headaches.  Hematological: Negative for adenopathy. Does not bruise/bleed easily.  Psychiatric/Behavioral: Negative for dysphoric mood. The patient is not nervous/anxious.          Objective:   Physical Exam  Constitutional: She appears well-developed and well-nourished. No distress.       Obese and well appearing   HENT:  Head: Normocephalic and atraumatic.  Mouth/Throat: Oropharynx is clear and moist.  Eyes: Conjunctivae and EOM are normal.  Pupils are equal, round, and reactive to light.  Neck: Normal range of motion. Neck supple. No JVD present. Carotid bruit is not present.  Cardiovascular: Normal rate, regular rhythm, normal heart sounds and intact distal pulses.  Exam reveals no gallop.   Pulmonary/Chest: Effort normal and breath sounds normal. No respiratory distress. She has no wheezes.  Musculoskeletal: She exhibits no edema.  Lymphadenopathy:    She has no cervical adenopathy.  Neurological: She is alert.  Skin: Skin is warm and dry.  Psychiatric: She has a normal mood and affect.          Assessment & Plan:

## 2012-02-01 NOTE — Assessment & Plan Note (Signed)
After initial improvement in LDL - it went up again into the 190s Disc goals for lipids and reasons to control them Rev labs with pt Rev low sat fat diet in detail  Goal is 130 or below - and pt had just gone on trip with high fat diet Will re check again 8 weeks after better diet and then decide whether or not to start a statin

## 2012-02-01 NOTE — Patient Instructions (Signed)
Avoid red meat/ fried foods/ egg yolks/ fatty breakfast meats/ butter, cheese and high fat dairy/ and shellfish   Schedule fasting labs in 8 weeks for cholesterol  If not improved -- could consider low dose statin  Don't forget to get your mammogram  Continue exercise

## 2012-03-28 ENCOUNTER — Ambulatory Visit: Payer: 59 | Admitting: Family Medicine

## 2012-03-28 ENCOUNTER — Other Ambulatory Visit (INDEPENDENT_AMBULATORY_CARE_PROVIDER_SITE_OTHER): Payer: 59

## 2012-03-28 DIAGNOSIS — E785 Hyperlipidemia, unspecified: Secondary | ICD-10-CM

## 2012-03-28 LAB — LIPID PANEL
Cholesterol: 243 mg/dL — ABNORMAL HIGH (ref 0–200)
HDL: 69.1 mg/dL (ref >39.00)
Total CHOL/HDL Ratio: 4
Triglycerides: 130 mg/dL (ref 0.0–149.0)
VLDL: 26 mg/dL (ref 0.0–40.0)

## 2012-03-28 LAB — LDL CHOLESTEROL, DIRECT: Direct LDL: 160.6 mg/dL

## 2012-03-30 ENCOUNTER — Telehealth: Payer: Self-pay | Admitting: *Deleted

## 2012-03-30 ENCOUNTER — Other Ambulatory Visit: Payer: Self-pay | Admitting: Family Medicine

## 2012-03-30 DIAGNOSIS — E785 Hyperlipidemia, unspecified: Secondary | ICD-10-CM

## 2012-03-30 MED ORDER — PANTOPRAZOLE SODIUM 40 MG PO TBEC: 40.0000 mg | DELAYED_RELEASE_TABLET | Freq: Every day | ORAL | Status: DC

## 2012-03-30 NOTE — Telephone Encounter (Signed)
Message copied by Regis Bill on Wed Mar 30, 2012  3:02 PM ------      Message from: Roxy Manns A      Created: Mon Mar 28, 2012  8:24 PM       Cholesterol is quite improved with LDL now in 160s (improved but not at goal of 130 or below)      [place] job with lifestyle change so far       Does she want to try a statin med to get to goal (we disc this at her last visit)

## 2012-03-30 NOTE — Telephone Encounter (Signed)
Pt request refill Pantoprazole as needed med sent to [facility].Please advise.

## 2012-03-30 NOTE — Telephone Encounter (Signed)
Will refill electronically  

## 2012-03-30 NOTE — Telephone Encounter (Signed)
Patient informed of lab results; agreed to try statin medication of your choice to [facility]

## 2012-03-31 MED ORDER — ATORVASTATIN CALCIUM 10 MG PO TABS: 10.0000 mg | ORAL_TABLET | Freq: Every day | ORAL | Status: DC

## 2012-03-31 NOTE — Telephone Encounter (Signed)
Sent px for generic lipitor to [facility] elect Check fasting lab 6 wk for lipid/ast/alt 272 please

## 2012-03-31 NOTE — Telephone Encounter (Signed)
Patient informed of new Rx & to be fasting for 6-wk lab f/u; lab orders placed/[facility]

## 2012-05-04 ENCOUNTER — Emergency Department (HOSPITAL_COMMUNITY)
Admission: EM | Admit: 2012-05-04 | Discharge: 2012-05-04 | Disposition: A | Discharge status: Home / Self Care | Payer: 59 | Attending: Emergency Medicine | Admitting: Emergency Medicine

## 2012-05-04 ENCOUNTER — Emergency Department (HOSPITAL_COMMUNITY): Payer: 59

## 2012-05-04 ENCOUNTER — Telehealth: Payer: Self-pay

## 2012-05-04 ENCOUNTER — Ambulatory Visit: Payer: 59 | Admitting: Family Medicine

## 2012-05-04 DIAGNOSIS — R0682 Tachypnea, not elsewhere classified: Secondary | ICD-10-CM | POA: Insufficient documentation

## 2012-05-04 DIAGNOSIS — R0789 Other chest pain: Secondary | ICD-10-CM

## 2012-05-04 DIAGNOSIS — R0602 Shortness of breath: Secondary | ICD-10-CM | POA: Insufficient documentation

## 2012-05-04 DIAGNOSIS — M549 Dorsalgia, unspecified: Secondary | ICD-10-CM | POA: Insufficient documentation

## 2012-05-04 DIAGNOSIS — R071 Chest pain on breathing: Secondary | ICD-10-CM | POA: Insufficient documentation

## 2012-05-04 LAB — CBC WITH DIFFERENTIAL/PLATELET
Basophils Absolute: 0 10*3/uL (ref 0.0–0.1)
Basophils Relative: 0 % (ref 0–1)
Eosinophils Absolute: 0 10*3/uL (ref 0.0–0.7)
Eosinophils Relative: 0 % (ref 0–5)
HCT: 41.8 % (ref 36.0–46.0)
Hemoglobin: 14.7 g/dL (ref 12.0–15.0)
Lymphocytes Relative: 15 % (ref 12–46)
Lymphs Abs: 1.4 10*3/uL (ref 0.7–4.0)
MCH: 31.6 pg (ref 26.0–34.0)
MCHC: 35.2 g/dL (ref 30.0–36.0)
MCV: 89.9 fL (ref 78.0–100.0)
Monocytes Absolute: 0.5 10*3/uL (ref 0.1–1.0)
Monocytes Relative: 6 % (ref 3–12)
Neutro Abs: 7 10*3/uL (ref 1.7–7.7)
Neutrophils Relative %: 78 % — ABNORMAL HIGH (ref 43–77)
Platelets: 280 10*3/uL (ref 150–400)
RBC: 4.65 MIL/uL (ref 3.87–5.11)
RDW: 12.2 % (ref 11.5–15.5)
WBC: 9 10*3/uL (ref 4.0–10.5)

## 2012-05-04 LAB — BASIC METABOLIC PANEL
BUN: 15 mg/dL (ref 6–23)
CO2: 19 mEq/L (ref 19–32)
Calcium: 9.7 mg/dL (ref 8.4–10.5)
Chloride: 103 mEq/L (ref 96–112)
Creatinine, Ser: 0.75 mg/dL (ref 0.50–1.10)
GFR calc Af Amer: >90 mL/min (ref >90)
GFR calc non Af Amer: >90 mL/min (ref >90)
Glucose, Bld: 104 mg/dL — ABNORMAL HIGH (ref 70–99)
Potassium: 3.6 mEq/L (ref 3.5–5.1)
Sodium: 138 mEq/L (ref 135–145)

## 2012-05-04 LAB — D-DIMER, QUANTITATIVE (NOT AT ARMC): D-Dimer, Quant: <0.22 ug/mL-FEU (ref 0.00–0.48)

## 2012-05-04 MED ORDER — HYDROCODONE-ACETAMINOPHEN 5-500 MG PO TABS: 1.0000 | ORAL_TABLET | Freq: Four times a day (QID) | ORAL | Status: AC | PRN

## 2012-05-04 MED ORDER — CYCLOBENZAPRINE HCL 10 MG PO TABS: 10.0000 mg | ORAL_TABLET | Freq: Two times a day (BID) | ORAL | Status: AC | PRN

## 2012-05-04 MED ORDER — NAPROXEN 375 MG PO TABS: 375.0000 mg | ORAL_TABLET | Freq: Two times a day (BID) | ORAL | Status: AC

## 2012-05-04 MED ORDER — IOHEXOL 350 MG/ML SOLN
100.0000 mL | Freq: Once | INTRAVENOUS | Status: AC | PRN
  Administered 2012-05-04: 100 mL via INTRAVENOUS

## 2012-05-04 MED ORDER — DIAZEPAM 5 MG PO TABS
5.0000 mg | ORAL_TABLET | Freq: Once | ORAL | Status: AC
  Administered 2012-05-04: 5 mg via ORAL
  Filled 2012-05-04: qty 1

## 2012-05-04 MED ORDER — HYDROCODONE-ACETAMINOPHEN 5-325 MG PO TABS
1.0000 | ORAL_TABLET | Freq: Once | ORAL | Status: AC
  Administered 2012-05-04: 1 via ORAL
  Filled 2012-05-04: qty 1

## 2012-05-04 NOTE — ED Notes (Signed)
Pt states she has been driving 8 hours a day for the last 7 days on a road trip to [place].  Pt c/o upper back pain since Sunday night. States she is SOB from the pain.

## 2012-05-04 NOTE — ED Provider Notes (Signed)
History     CSN: [id number]  Arrival date & time 05/04/12  1336   First MD Initiated Contact with Patient 05/04/12 1417      Chief Complaint  Patient presents with  . Back Pain    (Consider location/radiation/quality/duration/timing/severity/associated sxs/prior treatment) Patient is a 41 y.o. female presenting with back pain. The history is provided by the patient.  Back Pain  This is a new problem. The current episode started more than 2 days ago. The problem occurs constantly. The problem has not changed since onset.The pain is associated with no known injury. The quality of the pain is described as stabbing. The pain does not radiate. The pain is at a severity of 6/10. Pertinent negatives include no chest pain, no fever, no numbness, no abdominal pain, no dysuria and no weakness.  PT with left upper back pain for the last 3 days. Pain worsened with certain movements of the torso, laying certain way, and deep breathing. Pt states she is traveling and was driving from [place] over the last 7 days. She denies feeling short of breath. States no swelling in calves or feet. Took ibuprofen with no relief. No hx of PE or DVT. Denies fever, chills, cough, exertional chest pain    Past Medical History  Diagnosis Date  . Allergy     allergic rhinitis  . GERD (gastroesophageal reflux disease)   . Hyperlipidemia   . Anxiety     situational  . Status post endometrial ablation     Past Surgical History  Procedure Date  . Cesarean section   . Foot surgery   . Tonsillectomy     as child  . Endometrial ablation     8/12  . Tubal ligation 8/12  . Laparoscopic tubal ligation 06/09/2011    Procedure: LAPAROSCOPIC TUBAL LIGATION;  Surgeon: Melony Overly;  Location: [facility];  Service: Gynecology;  Laterality: N/A;    Family History  Problem Relation Age of Onset  . Cancer Mother     CA insitu of appendix  . Heart disease Father     CAD  . Diabetes Father     type II  . Diabetes  Brother     type II    History  Substance Use Topics  . Smoking status: Never Smoker   . Smokeless tobacco: Never Used  . Alcohol Use: Yes     occasionally    OB History    Grav Para Term Preterm Abortions TAB SAB Ect Mult Living                  Review of Systems  Constitutional: Negative for fever and chills.  Respiratory: Negative for cough, choking and chest tightness.   Cardiovascular: Negative for chest pain, palpitations and leg swelling.  Gastrointestinal: Negative for nausea, vomiting and abdominal pain.  Genitourinary: Negative for dysuria and flank pain.  Musculoskeletal: Positive for back pain.  Skin: Negative.   Neurological: Negative for dizziness, weakness and numbness.    Allergies  Review of patient's allergies indicates no known allergies.  Home Medications   Current Outpatient Rx  Name Route Sig Dispense Refill  . ATORVASTATIN CALCIUM 10 MG PO TABS Oral Take 1 tablet (10 mg total) by mouth daily. 30 tablet 11  . CALCIUM CARBONATE-VIT D-MIN 600-400 MG-UNIT PO TABS Oral Take 1 tablet by mouth 2 (two) times daily.     . IBUPROFEN 200 MG PO TABS Oral Take 800 mg by mouth every 6 (six) hours as needed.  pain    . MULTIVITAMINS PO CAPS Oral Take 1 capsule by mouth daily.      Marland Kitchen FISH OIL PO Oral Take by mouth daily.      Marland Kitchen PANTOPRAZOLE SODIUM 40 MG PO TBEC Oral Take 1 tablet (40 mg total) by mouth daily. as needed. 30 tablet 11    BP 121/91  Pulse 110  Temp 97.9 F (36.6 C) (Oral)  Resp 20  SpO2 100%  Physical Exam  Nursing note and vitals reviewed. Constitutional: She is oriented to person, place, and time. She appears well-developed and well-nourished. No distress.  HENT:  Head: Normocephalic.  Eyes: Conjunctivae are normal.  Neck: Neck supple.  Cardiovascular: Normal rate, regular rhythm and normal heart sounds.   Pulmonary/Chest: Effort normal and breath sounds normal. She has no wheezes. She has no rales.       Tachypnea. No tenderness to  palpation over left ribs  Abdominal: Soft. Bowel sounds are normal. She exhibits no distension. There is no tenderness. There is no rebound.  Musculoskeletal: She exhibits no edema.       No swelling of the feet, calves. No calf tenderness. Negative homan's sign  Neurological: She is alert and oriented to person, place, and time.  Skin: Skin is warm and dry.  Psychiatric: She has a normal mood and affect.    ED Course  Procedures (including critical care time)  Pt with daily 8hr traveling by car for last 7 days. Now pleuritic left sided upper back pain. Concern for PE. Will get d dimer, VS currently normal. Not tachy on monitor, oxygen sat 100%. Will get CXR. Pain meds and muscle relaxant ordered for symptoms.    Date: 05/04/2012  Rate: 106  Rhythm: sinus tachycardia  QRS Axis: normal  Intervals: normal  ST/T Wave abnormalities: normal  Conduction Disutrbances:none  Narrative Interpretation: Q waves in Leads II, III, AVF, could be variation of normal.   Old EKG Reviewed: none available  Results for orders placed during the hospital encounter of 05/04/12  CBC WITH DIFFERENTIAL      Component Value Range   WBC 9.0  4.0 - 10.5 K/uL   RBC 4.65  3.87 - 5.11 MIL/uL   Hemoglobin 14.7  12.0 - 15.0 g/dL   HCT 16.1  09.6 - 04.5 %   MCV 89.9  78.0 - 100.0 fL   MCH 31.6  26.0 - 34.0 pg   MCHC 35.2  30.0 - 36.0 g/dL   RDW 40.9  81.1 - 91.4 %   Platelets 280  150 - 400 K/uL   Neutrophils Relative 78 (*) 43 - 77 %   Neutro Abs 7.0  1.7 - 7.7 K/uL   Lymphocytes Relative 15  12 - 46 %   Lymphs Abs 1.4  0.7 - 4.0 K/uL   Monocytes Relative 6  3 - 12 %   Monocytes Absolute 0.5  0.1 - 1.0 K/uL   Eosinophils Relative 0  0 - 5 %   Eosinophils Absolute 0.0  0.0 - 0.7 K/uL   Basophils Relative 0  0 - 1 %   Basophils Absolute 0.0  0.0 - 0.1 K/uL  D-DIMER, QUANTITATIVE      Component Value Range   D-Dimer, Quant <0.22  0.00 - 0.48 ug/mL-FEU  BASIC METABOLIC PANEL      Component Value Range    Sodium 138  135 - 145 mEq/L   Potassium 3.6  3.5 - 5.1 mEq/L   Chloride 103  96 - 112  mEq/L   CO2 19  19 - 32 mEq/L   Glucose, Bld 104 (*) 70 - 99 mg/dL   BUN 15  6 - 23 mg/dL   Creatinine, Ser 4.09  0.50 - 1.10 mg/dL   Calcium 9.7  8.4 - 81.1 mg/dL   GFR calc non Af Amer >90  >90 mL/min   GFR calc Af Amer >90  >90 mL/min   Dg Chest 2 View  05/04/2012  *RADIOLOGY REPORT*  Clinical Data: Short of breath, back pain  CHEST - 2 VIEW  Comparison: None.  Findings: Well aerated, clear lungs.  Negative for edema, focal airspace consolidation, suspicious pulmonary nodule, pneumothorax or pleural effusion.  Cardiac and mediastinal silhouettes within normal limits.  Osseous structures are intact.  Mild multilevel degenerative disc disease throughout the visualized thoracic spine.  IMPRESSION: No acute cardiopulmonary disease.  Original Report Authenticated By: Vilma Prader    4:19 PM D dimer negative. Pt is not tachycardic, not tachypnec. Doubt PE at this point. Oxygen Sat 100%, HR 75, no calf of feet swelling. Pt continues to be anxious. She does have S1, Q3 on ECG. Discussed with Dr. Jeraldine Loots, who saw pt as well. Will scan since at risk for PE, tachy at times, not currently however.    Ct Angio Chest W/cm &/or Wo Cm  05/04/2012  *RADIOLOGY REPORT*  Clinical Data: Concern for pulmonary embolism.  CT ANGIOGRAPHY CHEST  Technique:  Multidetector CT imaging of the chest using the standard protocol during bolus administration of intravenous contrast. Multiplanar reconstructed images including MIPs were obtained and reviewed to evaluate the vascular anatomy.  Contrast: OMNIPAQUE IOHEXOL 350 MG/ML SOLN  Comparison: Chest radiograph 05/04/2012, D-dimer equal 0.22  Findings: Exam is suboptimal due to dispersion of the contrast bolus with opacification of the pulmonary veins and aorta equal to the pulmonary arteries.  There are no large filling defects in the proximal pulmonary arteries.  The segmental pulmonary  arteries are not  well evaluated.  No acute findings of the aorta or great vessels.  No pericardial fluid.  Esophagus is normal.  Review of the lung windows demonstrate no acute pulmonary findings. Limited view of the upper abdomen is unremarkable.  Review of the skeleton is unremarkable.  IMPRESSION: 1.  No gross evidence of pulmonary embolism in suboptimal exam. 2.  No acute pulmonary parenchymal findings.  Original Report Authenticated By: Genevive Bi, M.D.   Negative for PE, pt d/c home.     Filed Vitals:   05/04/12 1502  BP: 121/91  Pulse: 75  Temp: 98.1 F (36.7 C)  Resp: 14    1. Left-sided chest wall pain       MDM          Lottie Mussel, PA 05/04/12 1733

## 2012-05-04 NOTE — Telephone Encounter (Signed)
On 05/02/12 started when takes deep breath or moves has sharp pain on left side of upper back. No SOB and no chest pain. Pt states feels like intercostal muscle. Happened in Feb 2013 symptoms not as bad and just went away. Pt request appt. Bjorn Loser said no available openings for Dr Milinda Antis. Appt scheduled today at 2:30 pm with Dr Para March. Pt voiced understanding if condition changes or worsens or develops chest pain or SOB to call back to office.

## 2012-05-04 NOTE — Telephone Encounter (Signed)
Will see today.  

## 2012-05-05 NOTE — ED Provider Notes (Signed)
Medical screening examination/treatment/procedure(s) were conducted as a shared visit with non-physician practitioner(s) and myself.  I personally evaluated the patient during the encounter On my exam the patient was in no distress, though she complained of persistent pleuritic pain in the left lower chest, and was tachycardic on my exam.  Given the patient's risk factors of prolonged car travel, the persistent abnormal vital signs, she did have a CT angiogram, although her initial d-dimer was negative.  CT angiogram was negative, and the patient had substantial improvement in her condition.  She was discharged in stable condition.  On my exam the patient is tachycardic to 110 sinus tach abnormal Pulse oximetry is 99% on room air normal I seen the ECG and agree with the interpretation.  Gerhard Munch, MD 05/05/12 [phone]

## 2012-05-06 ENCOUNTER — Telehealth: Payer: Self-pay | Admitting: Family Medicine

## 2012-05-06 NOTE — Telephone Encounter (Signed)
Caller: Tom/Spouse; PCP: Tower, Marne A.; CB#: [phone];  Call regarding Back Pain that started on the L side  05/02/12 after a long car trip.  Was  in the E/R 05/04/12 and 05/05/12 pain started on the L side.  Still has apin after [place], Flexeril and Naprosyn.   When she lays down there is no pain, but cannot rise or stand.  Pt hurt back 2 weeks ago after picking up ice chest 04/24/12.  Triaged Back SX and disp = needs to be seen in the E/R as pt cannot stand upright.   Pt going to E/R. Travel care given.

## 2012-05-06 NOTE — Telephone Encounter (Signed)
Thanks- please check in with her later in the day to see how she is and if she needs any specialist referrals  Will be on the look out for ER notes

## 2012-05-10 ENCOUNTER — Ambulatory Visit: Payer: 59 | Admitting: Family Medicine

## 2012-05-12 NOTE — Telephone Encounter (Signed)
Spoke to patient spouse and he sated she was doing better. They want to hold off from any specialist at this time but if it don't get better they will try a specialist at that time.

## 2012-05-19 ENCOUNTER — Ambulatory Visit (INDEPENDENT_AMBULATORY_CARE_PROVIDER_SITE_OTHER): Payer: 59 | Admitting: Family Medicine

## 2012-05-19 ENCOUNTER — Encounter: Payer: Self-pay | Admitting: Family Medicine

## 2012-05-19 ENCOUNTER — Ambulatory Visit (HOSPITAL_COMMUNITY)
Admission: RE | Admit: 2012-05-19 | Discharge: 2012-05-19 | Disposition: A | Discharge status: Home / Self Care | Payer: 59 | Source: Ambulatory Visit | Attending: Family Medicine | Admitting: Family Medicine

## 2012-05-19 VITALS — BP 130/84 | HR 76 | Temp 98.8°F | Ht 62.0 in | Wt 191.2 lb

## 2012-05-19 DIAGNOSIS — M62838 Other muscle spasm: Secondary | ICD-10-CM

## 2012-05-19 DIAGNOSIS — R1011 Right upper quadrant pain: Secondary | ICD-10-CM | POA: Insufficient documentation

## 2012-05-19 DIAGNOSIS — M549 Dorsalgia, unspecified: Secondary | ICD-10-CM

## 2012-05-19 NOTE — Patient Instructions (Addendum)
REFERRAL: GO THE THE FRONT ROOM AT THE ENTRANCE OF OUR CLINIC, NEAR CHECK IN. ASK FOR Jamie Coleman. SHE WILL HELP YOU SET UP YOUR REFERRAL. DATE: TIME:  

## 2012-05-19 NOTE — Progress Notes (Signed)
Nature conservation officer at [facility] [address] [place] [address] Phone: [phone] Fax: [phone]  Date:  05/19/2012   Name:  Jamie Coleman   DOB:  Sep 22, 1971   MRN:  [id number]  PCP:  Roxy Manns, MD    Chief Complaint: Back Pain   History of Present Illness:  Jamie Coleman is a 41 y.o. very pleasant female patient who presents with the following:  Back pain almost 3 weeks --- has spent the last 4 weeks in a car, ruled out for a PE - went to the ER. Back was better last week, and was feeling better last week, and now it has changed and initially it was his upper back. Felt like an intercostal muscle. Now haviing pain in hher right lower flank. Primarily though R posterior and lateral mid-back, in region of parascapular muscles.  Was [place] country.  Had been in the car for eight or 9 hours.  RN - works in SICU  +/- if worse with food, now some pain in her flank and RUQ  Patient Active Problem List  Diagnosis  . DERMATOFIBROMA, ARM  . NEOPLASM, SKIN, UNCERTAIN BEHAVIOR  . HYPERLIPIDEMIA  . ANXIETY, SITUATIONAL  . ALLERGIC RHINITIS  . OTHER&UNSPECIFIED DISEASES THE ORAL SOFT TISSUES  . GERD  . IBS  . POSTPARTUM DEPRESSION  . Tachycardia  . Dizziness    Past Medical History  Diagnosis Date  . Allergy     allergic rhinitis  . GERD (gastroesophageal reflux disease)   . Hyperlipidemia   . Anxiety     situational  . Status post endometrial ablation     Past Surgical History  Procedure Date  . Cesarean section   . Foot surgery   . Tonsillectomy     as child  . Endometrial ablation     8/12  . Tubal ligation 8/12  . Laparoscopic tubal ligation 06/09/2011    Procedure: LAPAROSCOPIC TUBAL LIGATION;  Surgeon: Melony Overly;  Location: [facility];  Service: Gynecology;  Laterality: N/A;    History  Substance Use Topics  . Smoking status: Never Smoker   . Smokeless tobacco: Never Used  . Alcohol Use: Yes     occasionally    Family History   Problem Relation Age of Onset  . Cancer Mother     CA insitu of appendix  . Heart disease Father     CAD  . Diabetes Father     type II  . Diabetes Brother     type II    No Known Allergies  Medication list has been reviewed and updated.  Current Outpatient Prescriptions on File Prior to Visit  Medication Sig Dispense Refill  . ibuprofen (ADVIL,MOTRIN) 200 MG tablet Take 800 mg by mouth every 6 (six) hours as needed. pain      . Multiple Vitamin (MULTIVITAMIN) capsule Take 1 capsule by mouth daily.        . naproxen (NAPROSYN) 375 MG tablet Take 1 tablet (375 mg total) by mouth 2 (two) times daily.  20 tablet  0  . pantoprazole (PROTONIX) 40 MG tablet Take 1 tablet (40 mg total) by mouth daily. as needed.  30 tablet  11  . atorvastatin (LIPITOR) 10 MG tablet Take 1 tablet (10 mg total) by mouth daily.  30 tablet  11  . Calcium Carbonate-Vit D-Min 600-400 MG-UNIT TABS Take 1 tablet by mouth 2 (two) times daily.       . Omega-3 Fatty Acids (FISH OIL  PO) Take by mouth daily.          Review of Systems:  ROS: GEN: Acute illness details above GI: Tolerating PO intake GU: maintaining adequate hydration and urination Pulm: No SOB Interactive and getting along well at home.  Otherwise, ROS is as per the HPI.   Physical Examination: Filed Vitals:   05/19/12 1106  BP: 130/84  Pulse: 76  Temp: 98.8 F (37.1 C)   Filed Vitals:   05/19/12 1106  Height: 5\' 2"  (1.575 m)  Weight: 191 lb 4 oz (86.75 kg)   Body mass index is 34.98 kg/(m^2). Ideal Body Weight: Weight in (lb) to have BMI = 25: 136.4   GEN: WDWN, NAD, Non-toxic, A & O x 3 HEENT: Atraumatic, Normocephalic. Neck supple. No masses, No LAD. Ears and Nose: No external deformity. CV: RRR, No M/G/R. No JVD. No thrill. No extra heart sounds. PULM: CTA B, no wheezes, crackles, rhonchi. No retractions. No resp. distress. No accessory muscle use. ABD: S, mild ruq tenderness, ND, +BS. No rebound. No HSM. EXTR: No  c/c/e NEURO Normal gait.  PSYCH: Normally interactive. Conversant. Not depressed or anxious appearing.  Calm demeanor.    Assessment and Plan:  1. Back pain    2. Right upper quadrant pain  US Abdomen Complete  3. Trapezius muscle spasm     Obtain an Abdominal u/s to evaluate the presence of cholelithiasis. At time of note, results have returned and are negative.  Suspect all MSK, reviewed parascapular stretching and strengthening with pt  Orders Today:  Orders Placed This Encounter  Procedures  . US Abdomen Complete    Standing Status: Future     Number of Occurrences: 1     Standing Expiration Date: 07/19/2013    Order Specific Question:  Reason for exam:    Answer:  RUQ abd pain, eval, eval GB    Order Specific Question:  Preferred imaging location?    Answer:  [facility]    Medications Today: (Includes new updates added during medication reconciliation) No orders of the defined types were placed in this encounter.    Labs Results from 05/19/2012 Evaluation that have returned: Results for orders placed during the hospital encounter of 05/04/12  CBC WITH DIFFERENTIAL      Component Value Range   WBC 9.0  4.0 - 10.5 K/uL   RBC 4.65  3.87 - 5.11 MIL/uL   Hemoglobin 14.7  12.0 - 15.0 g/dL   HCT 16.1  09.6 - 04.5 %   MCV 89.9  78.0 - 100.0 fL   MCH 31.6  26.0 - 34.0 pg   MCHC 35.2  30.0 - 36.0 g/dL   RDW 40.9  81.1 - 91.4 %   Platelets 280  150 - 400 K/uL   Neutrophils Relative 78 (*) 43 - 77 %   Neutro Abs 7.0  1.7 - 7.7 K/uL   Lymphocytes Relative 15  12 - 46 %   Lymphs Abs 1.4  0.7 - 4.0 K/uL   Monocytes Relative 6  3 - 12 %   Monocytes Absolute 0.5  0.1 - 1.0 K/uL   Eosinophils Relative 0  0 - 5 %   Eosinophils Absolute 0.0  0.0 - 0.7 K/uL   Basophils Relative 0  0 - 1 %   Basophils Absolute 0.0  0.0 - 0.1 K/uL  D-DIMER, QUANTITATIVE      Component Value Range   D-Dimer, Quant <0.22  0.00 - 0.48 ug/mL-FEU  BASIC METABOLIC  PANEL      Component Value  Range   Sodium 138  135 - 145 mEq/L   Potassium 3.6  3.5 - 5.1 mEq/L   Chloride 103  96 - 112 mEq/L   CO2 19  19 - 32 mEq/L   Glucose, Bld 104 (*) 70 - 99 mg/dL   BUN 15  6 - 23 mg/dL   Creatinine, Ser 3.47  0.50 - 1.10 mg/dL   Calcium 9.7  8.4 - 42.5 mg/dL   GFR calc non Af Amer >90  >90 mL/min   GFR calc Af Amer >90  >90 mL/min    Dg Chest 2 View  05/04/2012  *RADIOLOGY REPORT*  Clinical Data: Short of breath, back pain  CHEST - 2 VIEW  Comparison: None.  Findings: Well aerated, clear lungs.  Negative for edema, focal airspace consolidation, suspicious pulmonary nodule, pneumothorax or pleural effusion.  Cardiac and mediastinal silhouettes within normal limits.  Osseous structures are intact.  Mild multilevel degenerative disc disease throughout the visualized thoracic spine.  IMPRESSION: No acute cardiopulmonary disease.  Original Report Authenticated By: Vilma Prader   Ct Angio Chest W/cm &/or Wo Cm  05/04/2012  *RADIOLOGY REPORT*  Clinical Data: Concern for pulmonary embolism.  CT ANGIOGRAPHY CHEST  Technique:  Multidetector CT imaging of the chest using the standard protocol during bolus administration of intravenous contrast. Multiplanar reconstructed images including MIPs were obtained and reviewed to evaluate the vascular anatomy.  Contrast: OMNIPAQUE IOHEXOL 350 MG/ML SOLN  Comparison: Chest radiograph 05/04/2012, D-dimer equal 0.22  Findings: Exam is suboptimal due to dispersion of the contrast bolus with opacification of the pulmonary veins and aorta equal to the pulmonary arteries.  There are no large filling defects in the proximal pulmonary arteries.  The segmental pulmonary arteries are not  well evaluated.  No acute findings of the aorta or great vessels.  No pericardial fluid.  Esophagus is normal.  Review of the lung windows demonstrate no acute pulmonary findings. Limited view of the upper abdomen is unremarkable.  Review of the skeleton is unremarkable.  IMPRESSION: 1.  No gross  evidence of pulmonary embolism in suboptimal exam. 2.  No acute pulmonary parenchymal findings.  Original Report Authenticated By: Genevive Bi, M.D.   US Abdomen Complete  05/19/2012  *RADIOLOGY REPORT*  Clinical Data:  Right upper quadrant abdominal pain  COMPLETE ABDOMINAL ULTRASOUND  Comparison:  None.  Findings:  Gallbladder:  The gallbladder is visualized and no gallstones are noted.  There is no pain over the gallbladder with compression.  Common bile duct:  The common bile duct is normal measuring 3.2 mm in diameter.  Liver:  The liver is echogenic consistent with fatty infiltration. No focal abnormality is seen.  IVC:  Appears normal.  Pancreas:  No focal abnormality seen.  Spleen:  The spleen is normal measuring 6.6 cm sagittally.  Right Kidney:  No hydronephrosis is seen.  The right kidney measures 12.9 cm sagittally with probable duplicated collecting system.  Left Kidney:  No hydronephrosis is seen.  The left kidney measures 11.5 cm.  Abdominal aorta:  The abdominal aorta is normal in caliber.  IMPRESSION:  1.  No gallstones.  No ductal dilatation. 2.  Echogenic liver consistent with fatty infiltration.  Original Report Authenticated By: Juline Patch, M.D.    Hannah Beat, MD

## 2012-06-23 ENCOUNTER — Telehealth: Payer: Self-pay | Admitting: Family Medicine

## 2012-06-23 NOTE — Telephone Encounter (Signed)
I will see her then  

## 2012-06-23 NOTE — Telephone Encounter (Signed)
Caller: Kersten/Patient; Patient Name: Jamie Coleman; PCP: Roxy Manns [facility]); Best Callback Phone Number: [phone]; Reason for call: TINGLING.  Patient states Dr. Milinda Antis gave her a prescription for Lipitor early in summer.  She has just started taking it three weeks ago 06/03/12.  She has noticed that within the last week 06/20/12, she is experiencing tingling in her lower extremities.  Describes it as  Intermittent in nature. Lasting  about 10 minutes.  She may go 5-6 hours and not have it and then it returns.    She began experiencing Leg tingling , both legs from waist down including buttocks last night.  It is not painful, "odd sensation", no paralysis, no weakness. No swelling, movement ROM normal. No rash, no allergic reaction signs, no edema  . Emergent s/sx ruled out per Neurological Deficits Protocol and Leg injury protocol  with exception to Symptoms started after changing dose of prescription, non prescription .". See Provider in 24 hours. Reviewed Health Information on Lipitor for side effects with patient.   There are no appointments in schedule for today or tomorrow for evaluation.  Called [facility] and spoke with Latta. Appointment 06/24/12,  12:15 pm   with Dr. Milinda Antis. MD.  Patient is aware of appointment time and date. She will call back for questions, changes or concerns.

## 2012-06-24 ENCOUNTER — Ambulatory Visit (INDEPENDENT_AMBULATORY_CARE_PROVIDER_SITE_OTHER): Payer: 59 | Admitting: Family Medicine

## 2012-06-24 ENCOUNTER — Encounter: Payer: Self-pay | Admitting: Family Medicine

## 2012-06-24 VITALS — BP 118/62 | HR 87 | Temp 98.6°F | Ht 62.0 in | Wt 191.0 lb

## 2012-06-24 DIAGNOSIS — R202 Paresthesia of skin: Secondary | ICD-10-CM

## 2012-06-24 DIAGNOSIS — R209 Unspecified disturbances of skin sensation: Secondary | ICD-10-CM

## 2012-06-24 NOTE — Progress Notes (Signed)
Subjective:    Patient ID: Jamie Coleman, female    DOB: 03-29-1971, 41 y.o.   MRN: [id number]  HPI Here with tingling in her legs  Feels like chills or goosebumps  Can sometimes comes all the way down from buttocks, and at other times just below the knee  No pain  No weakness  Today also the medial arm and 2 fingers as well - is thinking this is hormonal  That tends to come and go  No headaches or other neurologic symptoms No new stress  Is eating healthy and getting enough exercise  caff- 1 soda per day max  No low back pain   No new activity or exercise  No family hx of neurol disorder   Thinks this is all from the lipitor  Taking it for about 3 weeks  Symptoms started a week ago  Held dose yesterday   No other side effects   Patient Active Problem List  Diagnosis  . DERMATOFIBROMA, ARM  . NEOPLASM, SKIN, UNCERTAIN BEHAVIOR  . HYPERLIPIDEMIA  . ANXIETY, SITUATIONAL  . ALLERGIC RHINITIS  . OTHER&UNSPECIFIED DISEASES THE ORAL SOFT TISSUES  . GERD  . IBS  . POSTPARTUM DEPRESSION  . Tachycardia  . Dizziness   Past Medical History  Diagnosis Date  . Allergy     allergic rhinitis  . GERD (gastroesophageal reflux disease)   . Hyperlipidemia   . Anxiety     situational  . Status post endometrial ablation    Past Surgical History  Procedure Date  . Cesarean section   . Foot surgery   . Tonsillectomy     as child  . Endometrial ablation     8/12  . Tubal ligation 8/12  . Laparoscopic tubal ligation 06/09/2011    Procedure: LAPAROSCOPIC TUBAL LIGATION;  Surgeon: Melony Overly;  Location: [facility];  Service: Gynecology;  Laterality: N/A;   History  Substance Use Topics  . Smoking status: Never Smoker   . Smokeless tobacco: Never Used  . Alcohol Use: Yes     occasionally   Family History  Problem Relation Age of Onset  . Cancer Mother     CA insitu of appendix  . Heart disease Father     CAD  . Diabetes Father     type II  . Diabetes Brother     type II   No Known Allergies Current Outpatient Prescriptions on File Prior to Visit  Medication Sig Dispense Refill  . atorvastatin (LIPITOR) 10 MG tablet Take 1 tablet (10 mg total) by mouth daily.  30 tablet  11  . Calcium Carbonate-Vit D-Min 600-400 MG-UNIT TABS Take 1 tablet by mouth 2 (two) times daily.       Marland Kitchen ibuprofen (ADVIL,MOTRIN) 200 MG tablet Take 800 mg by mouth every 6 (six) hours as needed. pain      . Multiple Vitamin (MULTIVITAMIN) capsule Take 1 capsule by mouth daily.        . naproxen (NAPROSYN) 375 MG tablet Take 1 tablet (375 mg total) by mouth 2 (two) times daily.  20 tablet  0  . Omega-3 Fatty Acids (FISH OIL PO) Take by mouth daily.        . pantoprazole (PROTONIX) 40 MG tablet Take 1 tablet (40 mg total) by mouth daily. as needed.  30 tablet  11       Review of Systems Review of Systems  Constitutional: Negative for fever, appetite change, fatigue and unexpected weight change.  Eyes: Negative for  pain and visual disturbance.  Respiratory: Negative for cough and shortness of breath.   Cardiovascular: Negative for cp or palpitations    Gastrointestinal: Negative for nausea, diarrhea and constipation.  Genitourinary: Negative for urgency and frequency.  Skin: Negative for pallor or rash   Neurological: Negative for weakness, light-headedness, and headaches.  Hematological: Negative for adenopathy. Does not bruise/bleed easily.  Psychiatric/Behavioral: Negative for dysphoric mood. The patient is not nervous/anxious.         Objective:   Physical Exam  Constitutional: She appears well-developed and well-nourished. No distress.       obese and well appearing   HENT:  Head: Normocephalic and atraumatic.  Mouth/Throat: Oropharynx is clear and moist.  Eyes: Conjunctivae normal and EOM are normal. Pupils are equal, round, and reactive to light. No scleral icterus.  Neck: Normal range of motion. Neck supple. Carotid bruit is not present. No thyromegaly present.   Cardiovascular: Normal rate, regular rhythm, normal heart sounds and intact distal pulses.  Exam reveals no gallop.   Pulmonary/Chest: Effort normal and breath sounds normal. No respiratory distress. She has no wheezes.  Abdominal: Soft. Bowel sounds are normal. She exhibits no distension. There is no tenderness.  Musculoskeletal: She exhibits no edema and no tenderness.  Lymphadenopathy:    She has no cervical adenopathy.  Neurological: She is alert. She has normal strength and normal reflexes. She displays no atrophy and no tremor. No cranial nerve deficit or sensory deficit. She exhibits normal muscle tone. She displays a negative Romberg sign. Coordination and gait normal.       Nl exam No focal cerebellar signs No symptoms at time of exam, they are intermittent  Skin: Skin is warm and dry. No rash noted. No erythema. No pallor.  Psychiatric: She has a normal mood and affect.          Assessment & Plan:

## 2012-06-24 NOTE — Assessment & Plan Note (Signed)
Intermittent tingling of legs - with nl exam Suspect this may be a statin side eff Will hold lipitor for 2 weeks and call and update  If better-- may consider another statin

## 2012-06-24 NOTE — Patient Instructions (Addendum)
Hold your lipitor for 2 weeks and lets see what happens with your symptoms Call and update me after that  If we think the medicine is the issue - we will switch gears Keep eating a healthy diet

## 2012-11-11 ENCOUNTER — Other Ambulatory Visit: Payer: 59

## 2012-11-14 ENCOUNTER — Other Ambulatory Visit: Payer: 59

## 2012-12-01 ENCOUNTER — Other Ambulatory Visit (INDEPENDENT_AMBULATORY_CARE_PROVIDER_SITE_OTHER): Payer: 59

## 2012-12-01 DIAGNOSIS — E785 Hyperlipidemia, unspecified: Secondary | ICD-10-CM

## 2012-12-01 LAB — ALT: ALT: 44 U/L — ABNORMAL HIGH (ref 0–35)

## 2012-12-01 LAB — LIPID PANEL
Cholesterol: 185 mg/dL (ref 0–200)
HDL: 63 mg/dL (ref >39.00)
Total CHOL/HDL Ratio: 3
Triglycerides: 207 mg/dL — ABNORMAL HIGH (ref 0.0–149.0)
VLDL: 41.4 mg/dL — ABNORMAL HIGH (ref 0.0–40.0)

## 2012-12-01 LAB — AST: AST: 32 U/L (ref 0–37)

## 2012-12-01 LAB — LDL CHOLESTEROL, DIRECT: Direct LDL: 87.8 mg/dL

## 2012-12-01 NOTE — Addendum Note (Signed)
Addended by: Alvina Chou on: 12/01/2012 09:08 AM   Modules accepted: Orders

## 2012-12-02 ENCOUNTER — Encounter: Payer: Self-pay | Admitting: *Deleted

## 2013-02-15 ENCOUNTER — Ambulatory Visit (INDEPENDENT_AMBULATORY_CARE_PROVIDER_SITE_OTHER): Payer: 59 | Admitting: Family Medicine

## 2013-02-15 ENCOUNTER — Encounter: Payer: Self-pay | Admitting: Family Medicine

## 2013-02-15 VITALS — BP 136/88 | HR 74 | Temp 99.1°F | Ht 62.0 in | Wt 193.5 lb

## 2013-02-15 DIAGNOSIS — L723 Sebaceous cyst: Secondary | ICD-10-CM

## 2013-02-15 DIAGNOSIS — L570 Actinic keratosis: Secondary | ICD-10-CM | POA: Insufficient documentation

## 2013-02-15 NOTE — Assessment & Plan Note (Signed)
Treated with cryotx - liquid nitrogen times one  Pt tolerated well  Disc use of sunscreen and regular skin checks

## 2013-02-15 NOTE — Progress Notes (Signed)
Subjective:    Patient ID: Jamie Coleman, female    DOB: 1971-06-23, 42 y.o.   MRN: [id number]  HPI  Has a bump on back that has been there for years  Back is hurting lately - in that general area (feels like her back is tight)  Low back  ? New activity   No pain radiating to legs No numbness or weakess No rash or tick bikes   Also has a rough spot (1 cm) on right temple - worried it may be an AK  Tends to freckle and she does wear sun block  Patient Active Problem List   Diagnosis Date Noted  . Tingling 06/24/2012  . Tachycardia 01/30/2011  . Dizziness 01/30/2011  . OTHER&UNSPECIFIED DISEASES THE ORAL SOFT TISSUES 07/25/2008  . DERMATOFIBROMA, ARM 11/11/2007  . NEOPLASM, SKIN, UNCERTAIN BEHAVIOR 02/11/2007  . HYPERLIPIDEMIA 02/10/2007  . ANXIETY, SITUATIONAL 02/10/2007  . ALLERGIC RHINITIS 02/10/2007  . GERD 02/10/2007  . IBS 02/10/2007  . POSTPARTUM DEPRESSION 02/10/2007   Past Medical History  Diagnosis Date  . Allergy     allergic rhinitis  . GERD (gastroesophageal reflux disease)   . Hyperlipidemia   . Anxiety     situational  . Status post endometrial ablation    Past Surgical History  Procedure Laterality Date  . Cesarean section    . Foot surgery    . Tonsillectomy      as child  . Endometrial ablation      8/12  . Tubal ligation  8/12  . Laparoscopic tubal ligation  06/09/2011    Procedure: LAPAROSCOPIC TUBAL LIGATION;  Surgeon: Melony Overly;  Location: [facility];  Service: Gynecology;  Laterality: N/A;   History  Substance Use Topics  . Smoking status: Never Smoker   . Smokeless tobacco: Never Used  . Alcohol Use: Yes     Comment: occasionally   Family History  Problem Relation Age of Onset  . Cancer Mother     CA insitu of appendix  . Heart disease Father     CAD  . Diabetes Father     type II  . Diabetes Brother     type II   No Known Allergies Current Outpatient Prescriptions on File Prior to Visit  Medication Sig Dispense Refill   . atorvastatin (LIPITOR) 10 MG tablet Take 1 tablet (10 mg total) by mouth daily.  30 tablet  11  . Calcium Carbonate-Vit D-Min 600-400 MG-UNIT TABS Take 1 tablet by mouth 2 (two) times daily.       Marland Kitchen ibuprofen (ADVIL,MOTRIN) 200 MG tablet Take 800 mg by mouth every 6 (six) hours as needed. pain      . Multiple Vitamin (MULTIVITAMIN) capsule Take 1 capsule by mouth daily.        . naproxen (NAPROSYN) 375 MG tablet Take 1 tablet (375 mg total) by mouth 2 (two) times daily.  20 tablet  0  . Omega-3 Fatty Acids (FISH OIL PO) Take by mouth daily.        . pantoprazole (PROTONIX) 40 MG tablet Take 1 tablet (40 mg total) by mouth daily. as needed.  30 tablet  11   No current facility-administered medications on file prior to visit.     Review of Systems Review of Systems  Constitutional: Negative for fever, appetite change, fatigue and unexpected weight change.  Eyes: Negative for pain and visual disturbance.  Respiratory: Negative for cough and shortness of breath.   Cardiovascular: Negative for cp or palpitations  Gastrointestinal: Negative for nausea, diarrhea and constipation.  Genitourinary: Negative for urgency and frequency.  Skin: Negative for pallor or rash  pos for skin spot/ rough and lump on back  Neurological: Negative for weakness, light-headedness, numbness and headaches.  Hematological: Negative for adenopathy. Does not bruise/bleed easily.  Psychiatric/Behavioral: Negative for dysphoric mood. The patient is not nervous/anxious.         Objective:   Physical Exam  Constitutional: She appears well-developed and well-nourished. No distress.  obese and well appearing   HENT:  Head: Normocephalic and atraumatic.  Eyes: Conjunctivae are normal. Right eye exhibits no discharge. Left eye exhibits no discharge. No scleral icterus.  Neck: Normal range of motion. Neck supple.  Cardiovascular: Normal rate and regular rhythm.   Pulmonary/Chest: Effort normal and breath sounds  normal.  Musculoskeletal: Normal range of motion.  Lymphadenopathy:    She has no cervical adenopathy.  Neurological: She is alert.  Skin: Skin is warm and dry. No rash noted. No erythema. No pallor.  1.5 cm round rubbery superficial mass at site of L low back -nt and no redness-consistent with uninfected seb cyst   1 cm rough area at R temple consistent with AK  Psychiatric: She has a normal mood and affect.          Assessment & Plan:

## 2013-02-15 NOTE — Patient Instructions (Addendum)
I think you have a sebaceous cyst on your back - we will watch this - let me know if it gets bigger or red and painful  We treated an actinic keratosis on your face -keep it clean while it heals Keep using sun protection

## 2013-02-15 NOTE — Assessment & Plan Note (Signed)
Rubbery mobile 1.5 cm mass in L low back area-nt and no redness  Will observe No s/s of infection inst pt will watch for growth / tenderness/ or color change

## 2013-02-24 ENCOUNTER — Ambulatory Visit: Payer: 59 | Admitting: Family Medicine

## 2013-02-26 ENCOUNTER — Telehealth: Payer: Self-pay | Admitting: Family Medicine

## 2013-02-26 DIAGNOSIS — E785 Hyperlipidemia, unspecified: Secondary | ICD-10-CM

## 2013-02-26 NOTE — Telephone Encounter (Signed)
Message copied by Judy Pimple on Sun Feb 26, 2013  1:55 PM ------      Message from: Alvina Chou      Created: Thu Feb 16, 2013  2:53 PM      Regarding: lab orders for Monday, 5.19.14       Fasting labs ------

## 2013-02-28 ENCOUNTER — Other Ambulatory Visit: Payer: 59

## 2013-03-01 ENCOUNTER — Other Ambulatory Visit (INDEPENDENT_AMBULATORY_CARE_PROVIDER_SITE_OTHER): Payer: 59

## 2013-03-01 ENCOUNTER — Encounter: Payer: Self-pay | Admitting: Family Medicine

## 2013-03-01 ENCOUNTER — Ambulatory Visit (INDEPENDENT_AMBULATORY_CARE_PROVIDER_SITE_OTHER): Payer: 59 | Admitting: Family Medicine

## 2013-03-01 VITALS — BP 152/86 | HR 137 | Temp 99.2°F | Ht 62.0 in | Wt 189.2 lb

## 2013-03-01 DIAGNOSIS — K1379 Other lesions of oral mucosa: Secondary | ICD-10-CM | POA: Insufficient documentation

## 2013-03-01 DIAGNOSIS — F438 Other reactions to severe stress: Secondary | ICD-10-CM

## 2013-03-01 DIAGNOSIS — K1370 Unspecified lesions of oral mucosa: Secondary | ICD-10-CM

## 2013-03-01 DIAGNOSIS — F4389 Other reactions to severe stress: Secondary | ICD-10-CM

## 2013-03-01 DIAGNOSIS — E785 Hyperlipidemia, unspecified: Secondary | ICD-10-CM

## 2013-03-01 DIAGNOSIS — R000 Tachycardia, unspecified: Secondary | ICD-10-CM

## 2013-03-01 LAB — LIPID PANEL
Cholesterol: 190 mg/dL (ref 0–200)
HDL: 65.8 mg/dL (ref >39.00)
LDL Cholesterol: 104 mg/dL — ABNORMAL HIGH (ref 0–99)
Total CHOL/HDL Ratio: 3
Triglycerides: 100 mg/dL (ref 0.0–149.0)
VLDL: 20 mg/dL (ref 0.0–40.0)

## 2013-03-01 MED ORDER — NYSTATIN 100000 UNIT/ML MT SUSP: 500000.0000 [IU] | Freq: Three times a day (TID) | OROMUCOSAL | Status: DC

## 2013-03-01 MED ORDER — ALPRAZOLAM 0.5 MG PO TABS: 0.5000 mg | ORAL_TABLET | Freq: Two times a day (BID) | ORAL | Status: DC | PRN

## 2013-03-01 NOTE — Progress Notes (Signed)
Subjective:    Patient ID: Jamie Coleman, female    DOB: [birth year], 42 y.o.   MRN: [id number]  HPI Thinks she has an infection in her mouth (? Yeast) Started with some white film on inside of her lower lip-no ulcers  It burns a little bit - and feels very dry at times (esp at night)- and also under tongue   This started to get better and then worse again  Working at [employer] A lot of stress   No abx or steroids lately and no sugar binges   Her anxiety is much worse today - had a bad day yesterday (stressed) and then did not sleep  Came in for lab work  Today her heart rate is high - 137  bp is up as well   She has had this before when she is anxious   Used the elliptical for a while   No uri symptoms at all   Family hx of anxiety - gmother and brother had it   Patient Active Problem List   Diagnosis Date Noted  . Mouth pain 03/01/2013  . Sebaceous cyst 02/15/2013  . Actinic keratosis of right cheek 02/15/2013  . Tingling 06/24/2012  . Tachycardia 01/30/2011  . Dizziness 01/30/2011  . OTHER&UNSPECIFIED DISEASES THE ORAL SOFT TISSUES 07/25/2008  . DERMATOFIBROMA, ARM 11/11/2007  . NEOPLASM, SKIN, UNCERTAIN BEHAVIOR 02/11/2007  . HYPERLIPIDEMIA 02/10/2007  . ANXIETY, SITUATIONAL 02/10/2007  . ALLERGIC RHINITIS 02/10/2007  . GERD 02/10/2007  . IBS 02/10/2007  . POSTPARTUM DEPRESSION 02/10/2007   Past Medical History  Diagnosis Date  . Allergy     allergic rhinitis  . GERD (gastroesophageal reflux disease)   . Hyperlipidemia   . Anxiety     situational  . Status post endometrial ablation    Past Surgical History  Procedure Laterality Date  . Cesarean section    . Foot surgery    . Tonsillectomy      as child  . Endometrial ablation      8/12  . Tubal ligation  8/12  . Laparoscopic tubal ligation  06/09/2011    Procedure: LAPAROSCOPIC TUBAL LIGATION;  Surgeon: Melony Overly;  Location: [facility];  Service: Gynecology;  Laterality: N/A;   History  Substance  Use Topics  . Smoking status: Never Smoker   . Smokeless tobacco: Never Used  . Alcohol Use: Yes     Comment: occasionally   Family History  Problem Relation Age of Onset  . Cancer Mother     CA insitu of appendix  . Heart disease Father     CAD  . Diabetes Father     type II  . Diabetes Brother     type II   No Known Allergies Current Outpatient Prescriptions on File Prior to Visit  Medication Sig Dispense Refill  . atorvastatin (LIPITOR) 10 MG tablet Take 1 tablet (10 mg total) by mouth daily.  30 tablet  11  . Calcium Carbonate-Vit D-Min 600-400 MG-UNIT TABS Take 1 tablet by mouth 2 (two) times daily.       Marland Kitchen ibuprofen (ADVIL,MOTRIN) 200 MG tablet Take 800 mg by mouth every 6 (six) hours as needed. pain      . Multiple Vitamin (MULTIVITAMIN) capsule Take 1 capsule by mouth daily.        . naproxen (NAPROSYN) 375 MG tablet Take 1 tablet (375 mg total) by mouth 2 (two) times daily.  20 tablet  0  . Omega-3 Fatty Acids (FISH OIL PO) Take  by mouth daily.        . pantoprazole (PROTONIX) 40 MG tablet Take 1 tablet (40 mg total) by mouth daily. as needed.  30 tablet  11   No current facility-administered medications on file prior to visit.     Review of Systems Review of Systems  Constitutional: Negative for fever, appetite change,  and unexpected weight change.  Eyes: Negative for pain and visual disturbance.  Respiratory: Negative for cough and shortness of breath.   Cardiovascular: Negative for cp and pos for palpitations, neg for PND or orthopena Gastrointestinal: Negative for nausea, diarrhea and constipation.  Genitourinary: Negative for urgency and frequency.  Skin: Negative for pallor or rash   Neurological: Negative for weakness, light-headedness, numbness and headaches.  Hematological: Negative for adenopathy. Does not bruise/bleed easily.  Psychiatric/Behavioral: Negative for dysphoric mood. The patient anxious          Objective:   Physical Exam   Constitutional: She appears well-developed and well-nourished. No distress.  obese and well appearing   HENT:  Head: Normocephalic and atraumatic.  Right Ear: External ear normal.  Left Ear: External ear normal.  Nose: Nose normal.  Mouth/Throat: Oropharynx is clear and moist and mucous membranes are normal. No oral lesions. No edematous. No oropharyngeal exudate.  Some hyperemia behind lower and upper lips No lesions ulcers noted   Eyes: Conjunctivae and EOM are normal. Pupils are equal, round, and reactive to light. Right eye exhibits no discharge. Left eye exhibits no discharge. No scleral icterus.  Neck: Normal range of motion. Neck supple. No JVD present. Carotid bruit is not present. No thyromegaly present.  Cardiovascular: Regular rhythm, normal heart sounds and intact distal pulses.  Exam reveals no gallop.   No murmur heard. Pulmonary/Chest: Effort normal and breath sounds normal. No respiratory distress. She has no wheezes. She has no rales.  Abdominal: Soft. Bowel sounds are normal. She exhibits no distension and no mass. There is no tenderness.  No crackles   Musculoskeletal: She exhibits no edema.  Lymphadenopathy:    She has no cervical adenopathy.  Neurological: She is alert. She has normal reflexes. No cranial nerve deficit. She exhibits normal muscle tone. Coordination normal.  Skin: Skin is warm and dry. No rash noted. No erythema. No pallor.          Assessment & Plan:

## 2013-03-01 NOTE — Assessment & Plan Note (Addendum)
Sinus tach at rate of 119 Suspect due to anxiety- see eval for that Trial of xanax and pt will watch pulse closely and update if not imp or if new symptoms Consider tsh  F/u 2 weeks

## 2013-03-01 NOTE — Patient Instructions (Addendum)
Use the nystatin mouthwash for thrush as directed - update me if not improving  Take xanax as needed for anxiety Keep track of your pulse rate If symptoms acutely worsen - call or go to urgent care if after hours  Follow up with me in about 2 weeks

## 2013-03-02 NOTE — Assessment & Plan Note (Addendum)
Pt has had significant symptoms after a very stressful day yesterday  This has been intermittent in the past  Tachycardia on EKG Declines counseling at this time- and is not interested in daily med due to the infrequency of symptoms Given short term px for xanax .5 to use prn with caution -disc poss side eff  F/u in about 2 weeks Will update if not feeling better after first dose

## 2013-03-02 NOTE — Assessment & Plan Note (Signed)
This is improved today- my best guess is thrush that is already resolving  Will try nystatin mouthwash tid and update

## 2013-03-16 ENCOUNTER — Encounter: Payer: Self-pay | Admitting: Radiology

## 2013-03-17 ENCOUNTER — Ambulatory Visit: Payer: 59 | Admitting: Family Medicine

## 2013-03-17 DIAGNOSIS — Z0289 Encounter for other administrative examinations: Secondary | ICD-10-CM

## 2013-04-26 ENCOUNTER — Other Ambulatory Visit: Payer: Self-pay | Admitting: Family Medicine

## 2013-06-05 ENCOUNTER — Other Ambulatory Visit: Payer: Self-pay | Admitting: Family Medicine

## 2013-06-30 ENCOUNTER — Ambulatory Visit (INDEPENDENT_AMBULATORY_CARE_PROVIDER_SITE_OTHER): Payer: 59 | Admitting: Family Medicine

## 2013-06-30 ENCOUNTER — Encounter: Payer: Self-pay | Admitting: Family Medicine

## 2013-06-30 VITALS — BP 120/72 | HR 77 | Temp 98.2°F | Ht 62.0 in | Wt 194.0 lb

## 2013-06-30 DIAGNOSIS — R259 Unspecified abnormal involuntary movements: Secondary | ICD-10-CM

## 2013-06-30 DIAGNOSIS — R251 Tremor, unspecified: Secondary | ICD-10-CM | POA: Insufficient documentation

## 2013-06-30 NOTE — Assessment & Plan Note (Signed)
Isolated tremor in left hand when she flexes her wrist extensors. No red flags, no pain.  Follow over time. Likely MSK strain.

## 2013-06-30 NOTE — Patient Instructions (Addendum)
Follow symptoms.. If not continuing to resolve, let us know. Push fluids.

## 2013-06-30 NOTE — Progress Notes (Signed)
  Subjective:    Patient ID: Jamie Coleman, female    DOB: September 13, 1971, 42 y.o.   MRN: [id number]  HPI  42 year old female pt with history of anxiety, depression of Dr. Royden Purl presents with  a new onset tremor, weakness in left arm ( hand and forearm) a few hours ago, symptoms are almost entirely gone now. She was getting ready to eat lunch.  No other symptoms. No weakness elsewhere, no headache. No neuro change ( no cofusion, no slurred speech) She can trigger tremor by tipping wrist up, tremor does not happen at rest. No associated pain. No new changes, no new meds.  Has felt fine prior lately.  She did not  feel anxious before episode, a little after.    Review of Systems  Constitutional: Negative for fever and fatigue.  HENT: Negative for ear pain.   Eyes: Negative for pain.  Respiratory: Negative for chest tightness and shortness of breath.   Cardiovascular: Negative for chest pain, palpitations and leg swelling.  Gastrointestinal: Negative for nausea, abdominal pain, diarrhea and constipation.  Genitourinary: Negative for dysuria.       Objective:   Physical Exam  Constitutional: Vital signs are normal. She appears well-developed and well-nourished. She is cooperative.  Non-toxic appearance. She does not appear ill. No distress.  HENT:  Head: Normocephalic.  Right Ear: Hearing, tympanic membrane, external ear and ear canal normal. Tympanic membrane is not erythematous, not retracted and not bulging.  Left Ear: Hearing, tympanic membrane, external ear and ear canal normal. Tympanic membrane is not erythematous, not retracted and not bulging.  Nose: No mucosal edema or rhinorrhea. Right sinus exhibits no maxillary sinus tenderness and no frontal sinus tenderness. Left sinus exhibits no maxillary sinus tenderness and no frontal sinus tenderness.  Mouth/Throat: Uvula is midline, oropharynx is clear and moist and mucous membranes are normal.  Eyes: Conjunctivae, EOM and lids are  normal. Pupils are equal, round, and reactive to light. Lids are everted and swept, no foreign bodies found.  Neck: Trachea normal and normal range of motion. Neck supple. Carotid bruit is not present. No mass and no thyromegaly present.  Cardiovascular: Normal rate, regular rhythm, S1 normal, S2 normal, normal heart sounds, intact distal pulses and normal pulses.  Exam reveals no gallop and no friction rub.   No murmur heard. Pulmonary/Chest: Effort normal and breath sounds normal. Not tachypneic. No respiratory distress. She has no decreased breath sounds. She has no wheezes. She has no rhonchi. She has no rales.  Abdominal: Soft. Normal appearance and bowel sounds are normal. There is no tenderness.  Neurological: She is alert. She has normal strength and normal reflexes. She displays tremor. She displays no atrophy. No cranial nerve deficit or sensory deficit. She exhibits normal muscle tone. Coordination and gait normal.  Isolated tremor when wrist extensors flexed  Skin: Skin is warm, dry and intact. No rash noted.  Psychiatric: Her speech is normal and behavior is normal. Judgment and thought content normal. Her mood appears not anxious. Cognition and memory are normal. She does not exhibit a depressed mood.          Assessment & Plan:

## 2013-07-24 ENCOUNTER — Encounter: Payer: Self-pay | Admitting: Obstetrics and Gynecology

## 2013-07-24 ENCOUNTER — Ambulatory Visit (INDEPENDENT_AMBULATORY_CARE_PROVIDER_SITE_OTHER): Payer: 59 | Admitting: Obstetrics and Gynecology

## 2013-07-24 VITALS — BP 124/80 | HR 82 | Resp 18 | Ht 62.5 in | Wt 193.0 lb

## 2013-07-24 DIAGNOSIS — Z01419 Encounter for gynecological examination (general) (routine) without abnormal findings: Secondary | ICD-10-CM

## 2013-07-24 NOTE — Progress Notes (Signed)
GYNECOLOGY VISIT  PCP: [facility]  Referring provider:   HPI: 42 y.o.   Married  Caucasian  female  G1P2 (Twins)  here for Annual Exam New diagnosis of anxiety.   Xanax took away her symptoms of palpitations.   No more menses since essentially had ablation in August 2012.  Hgb:  PCP Urine: PCP   GYNECOLOGIC HISTORY: No LMP recorded. Patient has had an ablation. Sexually active:yes   Partner preference:Female  Contraception:   Tubal ligation (05/2011) Menopausal hormone therapy: no DES exposure: no   Blood transfusions: no   Sexually transmitted diseases: no GYN Procedures:  Ablation(05/2011) Colposcopy (2011) Tubal ligation(2012) Mammogram:  03/2012 normal               Pap:   03/2012 neg History of abnormal pap smear:  Yes    ? Dysplasia (colposcopy/ bx)     OB History   Grav Para Term Preterm Abortions TAB SAB Ect Mult Living   1 1 1      1 2        LIFESTYLE: Exercise:    Cardio 3-5 days a week           Tobacco: quit smoking 30 years ago Alcohol: 7-10 drinks a week (alcohol, wine or beer) Drug use: no   OTHER HEALTH MAINTENANCE: Tetanus/TDap: 2011 Gardisil: no Influenza:  2013 Zostavax: no  Bone density: never Colonoscopy: never  Cholesterol check: 2013 normal w/ medication  Family History  Problem Relation Age of Onset  . Cancer Mother     CA insitu of appendix  . Heart disease Father     CAD  . Diabetes Father     type II  . Diabetes Brother     type II  . Diabetes Maternal Grandfather   . Diabetes Paternal Grandmother   . Diabetes Paternal Grandfather     Patient Active Problem List   Diagnosis Date Noted  . Tremor 06/30/2013  . Mouth pain 03/01/2013  . Sebaceous cyst 02/15/2013  . Actinic keratosis of right cheek 02/15/2013  . Tingling 06/24/2012  . Tachycardia 01/30/2011  . Dizziness 01/30/2011  . OTHER&UNSPECIFIED DISEASES THE ORAL SOFT TISSUES 07/25/2008  . DERMATOFIBROMA, ARM 11/11/2007  . NEOPLASM, SKIN, UNCERTAIN BEHAVIOR  02/11/2007  . HYPERLIPIDEMIA 02/10/2007  . ANXIETY, SITUATIONAL 02/10/2007  . ALLERGIC RHINITIS 02/10/2007  . GERD 02/10/2007  . IBS 02/10/2007  . POSTPARTUM DEPRESSION 02/10/2007   Past Medical History  Diagnosis Date  . Allergy     allergic rhinitis  . GERD (gastroesophageal reflux disease)   . Hyperlipidemia   . Anxiety     situational  . Status post endometrial ablation     Past Surgical History  Procedure Laterality Date  . Cesarean section    . Foot surgery    . Tonsillectomy      as child  . Endometrial ablation      8/12  . Tubal ligation  8/12  . Laparoscopic tubal ligation  06/09/2011    Procedure: LAPAROSCOPIC TUBAL LIGATION;  Surgeon: Melony Overly;  Location: [facility];  Service: Gynecology;  Laterality: N/A;    ALLERGIES: Review of patient's allergies indicates no known allergies.  Current Outpatient Prescriptions  Medication Sig Dispense Refill  . ALPRAZolam (XANAX) 0.5 MG tablet Take 1 tablet (0.5 mg total) by mouth 2 (two) times daily as needed for anxiety.  15 tablet  0  . atorvastatin (LIPITOR) 10 MG tablet TAKE 1 TABLET BY MOUTH ONCE DAILY  30 tablet  3  . Calcium Carbonate-Vit D-Min 600-400 MG-UNIT TABS Take 1 tablet by mouth 2 (two) times daily.       . cetirizine (ZYRTEC) 10 MG tablet Take 10 mg by mouth as needed for allergies.      Marland Kitchen ibuprofen (ADVIL,MOTRIN) 200 MG tablet Take 800 mg by mouth every 6 (six) hours as needed. pain      . Multiple Vitamin (MULTIVITAMIN) capsule Take 1 capsule by mouth daily.        . Omega-3 Fatty Acids (FISH OIL PO) Take by mouth daily.        . pantoprazole (PROTONIX) 40 MG tablet TAKE 1 TABLET BY MOUTH ONCE DAILY AS NEEDED  30 tablet  2   No current facility-administered medications for this visit.     ROS:  Pertinent items are noted in HPI.  SOCIAL HISTORY:  Nurse.  Twin daughters.   PHYSICAL EXAMINATION:    BP 124/80  Pulse 82  Resp 18  Ht 5' 2.5" (1.588 m)  Wt 193 lb (87.544 kg)  BMI 34.72 kg/m2   Wt  Readings from Last 3 Encounters:  07/24/13 193 lb (87.544 kg)  06/30/13 194 lb (87.998 kg)  03/01/13 189 lb 4 oz (85.843 kg)     Ht Readings from Last 3 Encounters:  07/24/13 5' 2.5" (1.588 m)  06/30/13 5\' 2"  (1.575 m)  03/01/13 5\' 2"  (1.575 m)    General appearance: alert, cooperative and appears stated age Head: Normocephalic, without obvious abnormality, atraumatic Neck: no adenopathy, supple, symmetrical, trachea midline and thyroid not enlarged, symmetric, no tenderness/mass/nodules Lungs: clear to auscultation bilaterally Breasts: Inspection negative, No nipple retraction or dimpling, No nipple discharge or bleeding, No axillary or supraclavicular adenopathy, Normal to palpation without dominant masses Heart: regular rate and rhythm Abdomen: soft, non-tender; no masses,  no organomegaly Extremities: extremities normal, atraumatic, no cyanosis or edema Skin: Skin color, texture, turgor normal. No rashes or lesions Lymph nodes: Cervical, supraclavicular, and axillary nodes normal. No abnormal inguinal nodes palpated Neurologic: Grossly normal  Pelvic: External genitalia:  no lesions              Urethra:  normal appearing urethra with no masses, tenderness or lesions              Bartholins and Skenes: normal                 Vagina: normal appearing vagina with normal color and discharge, no lesions              Cervix: normal appearance              Pap and high risk HPV testing done: yes.            Bimanual Exam:  Uterus:  uterus is normal size, shape, consistency and nontender                                      Adnexa: normal adnexa in size, nontender and no masses                                      Rectovaginal: Confirms  Anus:  normal sphincter tone, no lesions  ASSESSMENT  Normal gynecologic exam. History of abnormal paps.   PLAN  Mammogram at [facility].  Patient will call to schedule. Pap smear and high risk HPV  testing Will get records from prior office regarding abnormal paps, colposcopy and biopsies Labs with PCP Return annually or prn   An After Visit Summary was printed and given to the patient.

## 2013-07-24 NOTE — Patient Instructions (Signed)

## 2013-07-25 NOTE — Addendum Note (Signed)
Addended by: Conley Simmonds on: 07/25/2013 06:32 PM   Modules accepted: Orders

## 2013-07-28 LAB — IPS PAP TEST WITH HPV

## 2013-08-07 ENCOUNTER — Encounter: Payer: Self-pay | Admitting: Family Medicine

## 2013-08-07 ENCOUNTER — Telehealth: Payer: Self-pay | Admitting: Family Medicine

## 2013-08-07 ENCOUNTER — Ambulatory Visit (INDEPENDENT_AMBULATORY_CARE_PROVIDER_SITE_OTHER): Payer: 59 | Admitting: Family Medicine

## 2013-08-07 VITALS — BP 138/78 | HR 104 | Temp 98.6°F | Ht 62.5 in | Wt 190.5 lb

## 2013-08-07 DIAGNOSIS — R6889 Other general symptoms and signs: Secondary | ICD-10-CM | POA: Insufficient documentation

## 2013-08-07 DIAGNOSIS — R000 Tachycardia, unspecified: Secondary | ICD-10-CM

## 2013-08-07 DIAGNOSIS — F438 Other reactions to severe stress: Secondary | ICD-10-CM

## 2013-08-07 DIAGNOSIS — E785 Hyperlipidemia, unspecified: Secondary | ICD-10-CM

## 2013-08-07 DIAGNOSIS — R42 Dizziness and giddiness: Secondary | ICD-10-CM

## 2013-08-07 DIAGNOSIS — F4389 Other reactions to severe stress: Secondary | ICD-10-CM

## 2013-08-07 NOTE — Assessment & Plan Note (Addendum)
Usually with exertion- no cp but has general exercise intol with stairs EKG is stable today with rate of 91 at rest and no acute changes ? Anxiety provoked  Lab today incl tsh Consider cardiol ref  Beta blocker may be helpful

## 2013-08-07 NOTE — Progress Notes (Signed)
Subjective:    Patient ID: Jamie Coleman, female    DOB: 05/10/1971, 42 y.o.   MRN: [id number]  HPI Here with tachycardia - again  Thinks anxiety is involved  Pulse 108 at home   Stairs- get her out of breath for past 2 mo  However - her exercise tol was generally good - can spend up to 1 hour on elliptical   Even yesterday - her hr went up to 120 - with a slow stroll She is worried about her cardiovasc health Husband thinks anxiety plays more of a role  Diarrhea since last week also -imp today No tremor or wt loss   She took xanax Friday and sat and sun am -- did not resolve high HR / but did help her relax and made her sleepy   No palpitations No caffeine since Friday   Last week - high HR at the dentist - with lidocaine/ epi   BP Readings from Last 3 Encounters:  08/07/13 138/78  07/24/13 124/80  06/30/13 120/72    Also tired and a little dizzy   Stressors -had a big party and parents in town - lots of guests   No results found for this basename: TSH    Lab Results  Component Value Date   WBC 9.0 05/04/2012   HGB 14.7 05/04/2012   HCT 41.8 05/04/2012   MCV 89.9 05/04/2012   PLT 280 05/04/2012    Patient Active Problem List   Diagnosis Date Noted  . Tremor 06/30/2013  . Mouth pain 03/01/2013  . Sebaceous cyst 02/15/2013  . Actinic keratosis of right cheek 02/15/2013  . Tingling 06/24/2012  . Tachycardia 01/30/2011  . Dizziness 01/30/2011  . OTHER&UNSPECIFIED DISEASES THE ORAL SOFT TISSUES 07/25/2008  . DERMATOFIBROMA, ARM 11/11/2007  . NEOPLASM, SKIN, UNCERTAIN BEHAVIOR 02/11/2007  . HYPERLIPIDEMIA 02/10/2007  . ANXIETY, SITUATIONAL 02/10/2007  . ALLERGIC RHINITIS 02/10/2007  . GERD 02/10/2007  . IBS 02/10/2007  . POSTPARTUM DEPRESSION 02/10/2007   Past Medical History  Diagnosis Date  . Allergy     allergic rhinitis  . GERD (gastroesophageal reflux disease)   . Hyperlipidemia   . Anxiety     situational  . Status post endometrial ablation     Past Surgical History  Procedure Laterality Date  . Cesarean section    . Foot surgery    . Tonsillectomy      as child  . Endometrial ablation      8/12  . Tubal ligation  8/12  . Laparoscopic tubal ligation  06/09/2011    Procedure: LAPAROSCOPIC TUBAL LIGATION;  Surgeon: Melony Overly;  Location: [facility];  Service: Gynecology;  Laterality: N/A;   History  Substance Use Topics  . Smoking status: Former Smoker    Quit date: 07/25/1983  . Smokeless tobacco: Never Used  . Alcohol Use: 5.0 oz/week    10 drink(s) per week     Comment: 7-10 drinks a week (alcohol, wine, or beer)/ daily   Family History  Problem Relation Age of Onset  . Cancer Mother     CA insitu of appendix  . Heart disease Father     CAD  . Diabetes Father     type II  . Diabetes Brother     type II  . Diabetes Maternal Grandfather   . Diabetes Paternal Grandmother   . Diabetes Paternal Grandfather    No Known Allergies Current Outpatient Prescriptions on File Prior to Visit  Medication Sig Dispense Refill  .  ALPRAZolam (XANAX) 0.5 MG tablet Take 1 tablet (0.5 mg total) by mouth 2 (two) times daily as needed for anxiety.  15 tablet  0  . Calcium Carbonate-Vit D-Min 600-400 MG-UNIT TABS Take 1 tablet by mouth 2 (two) times daily.       . cetirizine (ZYRTEC) 10 MG tablet Take 10 mg by mouth daily as needed for allergies.       Marland Kitchen ibuprofen (ADVIL,MOTRIN) 200 MG tablet Take 800 mg by mouth every 6 (six) hours as needed. pain      . Multiple Vitamin (MULTIVITAMIN) capsule Take 1 capsule by mouth daily.        . Omega-3 Fatty Acids (FISH OIL PO) Take by mouth daily.        Marland Kitchen atorvastatin (LIPITOR) 10 MG tablet TAKE 1 TABLET BY MOUTH ONCE DAILY  30 tablet  3   No current facility-administered medications on file prior to visit.      Review of Systems    Review of Systems  Constitutional: Negative for fever, appetite change,  and unexpected weight change.pos for fatigue  Eyes: Negative for pain and visual  disturbance.  ENT neg for goiter or neck swelling  Respiratory: Negative for cough and shortness of breath.   Cardiovascular: Negative for cp or palpitations   pos for rapid heart rate and exercise intol/ neg for pedal edema or PND or orthopnea  Gastrointestinal: Negative for nausea, diarrhea and constipation.  Genitourinary: Negative for urgency and frequency.  Skin: Negative for pallor or rash   Neurological: Negative for weakness, light-headedness, numbness and headaches.  Hematological: Negative for adenopathy. Does not bruise/bleed easily.  Psychiatric/Behavioral: Negative for dysphoric mood. The patient is nervous/anxious.      Objective:   Physical Exam  Constitutional: She appears well-developed and well-nourished. No distress.  obese and well appearing   HENT:  Head: Normocephalic and atraumatic.  Mouth/Throat: Oropharynx is clear and moist.  Eyes: Conjunctivae and EOM are normal. Pupils are equal, round, and reactive to light. Right eye exhibits no discharge. Left eye exhibits no discharge. No scleral icterus.  Neck: Normal range of motion. Neck supple. No JVD present. No thyromegaly present.  Cardiovascular: Normal rate, regular rhythm, normal heart sounds and intact distal pulses.  Exam reveals no gallop.   Pulmonary/Chest: Effort normal and breath sounds normal. No respiratory distress. She has no wheezes. She has no rales.  Abdominal: Soft. Bowel sounds are normal. She exhibits no distension and no mass. There is no tenderness.  Musculoskeletal: She exhibits no edema and no tenderness.  Lymphadenopathy:    She has no cervical adenopathy.  Neurological: She is alert. She has normal reflexes. She displays no tremor. No cranial nerve deficit. She exhibits normal muscle tone. Coordination normal.  Nl gait  Skin: Skin is warm and dry. No rash noted. No erythema. No pallor.  No sweating or flushing  Psychiatric: Her speech is normal and behavior is normal. Thought content  normal. Her mood appears anxious. Her affect is not labile. She is not agitated and not hyperactive. Thought content is not paranoid. She does not exhibit a depressed mood. She expresses no homicidal and no suicidal ideation.  Mildly anxious Good historian          Assessment & Plan:

## 2013-08-07 NOTE — Assessment & Plan Note (Signed)
Unusual -for stairs (but ok with elliptical) In light of myalgias wil hold statin for now  Lab today

## 2013-08-07 NOTE — Assessment & Plan Note (Signed)
Will hold statin for now in light of muscle pain

## 2013-08-07 NOTE — Assessment & Plan Note (Signed)
With rapid HR and exercise and anxiety Lab today Consider cardiol eval and tx of anx

## 2013-08-07 NOTE — Assessment & Plan Note (Signed)
?   If cause of tachycardia or other symptoms Some imp with xanax  Will work up tachycardia/labs /poss card eval  If all nl -disc long term plan for anxiety

## 2013-08-07 NOTE — Patient Instructions (Addendum)
Lab today  If all normal - I will refer to cardiology for exercise intolerance and for tachycardia Then - we will pick up from there with discussion of anxiety Stop the statin  Try yoga

## 2013-08-07 NOTE — Telephone Encounter (Signed)
Will see in office today as planned

## 2013-08-07 NOTE — Telephone Encounter (Signed)
Patient Information:  Caller Name: Brittinee  Phone: [phone]  Patient: Jamie Coleman, Jamie Coleman  Gender: Female  DOB: 27-Aug-1971  Age: 42 Years  PCP: Roxy Manns [facility])  Pregnant: No  Office Follow Up:  Does the office need to follow up with this patient?: No  Instructions For The Office: N/A  RN Note:  Uterine ablation/no menses. Pulse 108 while standing in kitchen now. Stopped taking statin 08/05/13 due to muscle pain. Reported ongoing shortness of breath when climbs stairs for past 3 months.  Stopped using caffeine 08/04/13.  Office already scheduled for appointment at 1500 08/07/13.  Symptoms  Reason For Call & Symptoms: Intermittent tachycardia with exertion. While resting pulse is 80; Pulse is 110 while doing dishes and 140-150 after taking stairs.  Reviewed Health History In EMR: Yes  Reviewed Medications In EMR: Yes  Reviewed Allergies In EMR: Yes  Reviewed Surgeries / Procedures: Yes  Date of Onset of Symptoms: 08/02/2013 OB / GYN:  LMP: Unknown  Guideline(s) Used:  Heart Rate and Heartbeat Questions  Disposition Per Guideline:   See Today in Office  Reason For Disposition Reached:   Patient wants to be seen  Advice Given:  Reassurance  Everybody has palpitations at some point in their lives. Sometimes it is simply a heightened awareness of the heart's normal beating.  Avoid Caffeine  Avoid caffeine-containing beverages (Reason: caffeine is a stimulant and can aggravate palpitations).  Expected Course  : If your symptoms do not improve over the next couple days then you should make an appointment to see your doctor.  Call Back If:  Chest pain, lightheadedness, or difficulty breathing occurs  Heart beating more than 130 beats / minute  More than 3 extra or skipped beats / minute  You become worse.  RN Overrode Recommendation:  Patient Already Has Appt, Document Patient  Office staff already scheduled appointment for 1500 08/07/13.

## 2013-08-08 ENCOUNTER — Other Ambulatory Visit: Payer: Self-pay | Admitting: Family Medicine

## 2013-08-08 LAB — CBC WITH DIFFERENTIAL/PLATELET
Basophils Absolute: 0.1 10*3/uL (ref 0.0–0.1)
Basophils Relative: 0.7 % (ref 0.0–3.0)
Eosinophils Absolute: 0 10*3/uL (ref 0.0–0.7)
Eosinophils Relative: 0.3 % (ref 0.0–5.0)
HCT: 42 % (ref 36.0–46.0)
Hemoglobin: 14.3 g/dL (ref 12.0–15.0)
Lymphocytes Relative: 15.7 % (ref 12.0–46.0)
Lymphs Abs: 1.6 10*3/uL (ref 0.7–4.0)
MCHC: 33.9 g/dL (ref 30.0–36.0)
MCV: 94 fl (ref 78.0–100.0)
Monocytes Absolute: 0.5 10*3/uL (ref 0.1–1.0)
Monocytes Relative: 4.8 % (ref 3.0–12.0)
Neutro Abs: 7.9 10*3/uL — ABNORMAL HIGH (ref 1.4–7.7)
Neutrophils Relative %: 78.5 % — ABNORMAL HIGH (ref 43.0–77.0)
Platelets: 281 10*3/uL (ref 150.0–400.0)
RBC: 4.47 Mil/uL (ref 3.87–5.11)
RDW: 12.7 % (ref 11.5–14.6)
WBC: 10 10*3/uL (ref 4.5–10.5)

## 2013-08-08 LAB — COMPREHENSIVE METABOLIC PANEL
ALT: 31 U/L (ref 0–35)
AST: 29 U/L (ref 0–37)
Albumin: 4.3 g/dL (ref 3.5–5.2)
Alkaline Phosphatase: 54 U/L (ref 39–117)
BUN: 11 mg/dL (ref 6–23)
CO2: 23 mEq/L (ref 19–32)
Calcium: 9.7 mg/dL (ref 8.4–10.5)
Chloride: 105 mEq/L (ref 96–112)
Creatinine, Ser: 0.9 mg/dL (ref 0.4–1.2)
GFR: 76.29 mL/min (ref >60.00)
Glucose, Bld: 120 mg/dL — ABNORMAL HIGH (ref 70–99)
Potassium: 4.4 mEq/L (ref 3.5–5.1)
Sodium: 139 mEq/L (ref 135–145)
Total Bilirubin: 0.4 mg/dL (ref 0.3–1.2)
Total Protein: 7.5 g/dL (ref 6.0–8.3)

## 2013-08-08 LAB — TSH: TSH: 1.79 u[IU]/mL (ref 0.35–5.50)

## 2013-08-16 ENCOUNTER — Encounter: Payer: Self-pay | Admitting: Family Medicine

## 2013-08-16 ENCOUNTER — Ambulatory Visit (HOSPITAL_COMMUNITY)
Admission: RE | Admit: 2013-08-16 | Discharge: 2013-08-16 | Disposition: A | Discharge status: Home / Self Care | Payer: 59 | Source: Ambulatory Visit | Attending: Obstetrics and Gynecology | Admitting: Obstetrics and Gynecology

## 2013-08-16 DIAGNOSIS — Z01419 Encounter for gynecological examination (general) (routine) without abnormal findings: Secondary | ICD-10-CM

## 2013-08-16 DIAGNOSIS — Z1231 Encounter for screening mammogram for malignant neoplasm of breast: Secondary | ICD-10-CM | POA: Insufficient documentation

## 2013-08-16 MED ORDER — ALPRAZOLAM 0.5 MG PO TABS: 0.5000 mg | ORAL_TABLET | Freq: Two times a day (BID) | ORAL | Status: DC | PRN

## 2013-08-16 NOTE — Telephone Encounter (Signed)
Please call in xanax

## 2013-08-17 ENCOUNTER — Other Ambulatory Visit: Payer: Self-pay

## 2013-08-17 NOTE — Telephone Encounter (Signed)
Rx called in as prescribed 

## 2013-08-22 ENCOUNTER — Telehealth: Payer: Self-pay | Admitting: Family Medicine

## 2013-08-22 ENCOUNTER — Encounter: Payer: Self-pay | Admitting: Family Medicine

## 2013-08-22 DIAGNOSIS — R000 Tachycardia, unspecified: Secondary | ICD-10-CM

## 2013-08-22 DIAGNOSIS — R202 Paresthesia of skin: Secondary | ICD-10-CM

## 2013-08-22 NOTE — Telephone Encounter (Signed)
Cardiology referral  

## 2013-08-22 NOTE — Telephone Encounter (Signed)
Message copied by Judy Pimple on Tue Aug 22, 2013 12:29 PM ------      Message from: Carlton Adam      Created: Tue Aug 22, 2013 12:08 PM      Regarding: Cardiology referral       Dr Milinda Antis, I never got a Cardiology Referral on this patient, would you please put one in on her! Patient had made her own appt with Dr Jens Som and then Cardiology cancelled the appt as they wanted a formal referral put in from you. Thanks, Shirlee Limerick ------

## 2013-09-04 ENCOUNTER — Encounter: Payer: Self-pay | Admitting: Cardiology

## 2013-09-04 ENCOUNTER — Ambulatory Visit: Payer: 59 | Admitting: Cardiology

## 2013-09-04 ENCOUNTER — Encounter: Payer: Self-pay | Admitting: Interventional Cardiology

## 2013-09-04 ENCOUNTER — Ambulatory Visit (INDEPENDENT_AMBULATORY_CARE_PROVIDER_SITE_OTHER): Payer: 59 | Admitting: Interventional Cardiology

## 2013-09-04 VITALS — BP 116/72 | HR 104 | Ht 62.5 in | Wt 188.0 lb

## 2013-09-04 DIAGNOSIS — R0602 Shortness of breath: Secondary | ICD-10-CM

## 2013-09-04 DIAGNOSIS — R002 Palpitations: Secondary | ICD-10-CM

## 2013-09-04 NOTE — Progress Notes (Signed)
Patient ID: Jamie Coleman, female   DOB: 03-03-1971, 42 y.o.   MRN: [id number]     Patient ID: Jamie Coleman MRN: [id number] DOB/AGE: 06/15/1971 42 y.o.   Referring Physician Dr. Milinda Antis   Reason for Consultation palpitations  HPI: 55 y/o who has had palpitations.  THey have been intermitent.  SHe had a dental procedure with Lidocaine and Epi.  Se had a prolonged episode of palpitations after that.  She has had episodes of high heart rate with just walking.  She does the elliptical and her HR gores to the 140s quickly.  She also walks regularly.  No CP.  She has some SHOB with activity.  After a prolonged episode of fast heart rate, she will feel very tired. She takes a deep breath to help with the chest pain.  She has had PVCs in the past with execessive caffeine.  These sx that she is currently having are different than the PVCs.    She has not had an echo and a stress test in the past.    Current Outpatient Prescriptions  Medication Sig Dispense Refill  . ALPRAZolam (XANAX) 0.5 MG tablet Take 1 tablet (0.5 mg total) by mouth 2 (two) times daily as needed for anxiety.  15 tablet  0  . Calcium Carbonate-Vit D-Min 600-400 MG-UNIT TABS Take 1 tablet by mouth 2 (two) times daily.       . cetirizine (ZYRTEC) 10 MG tablet Take 10 mg by mouth daily as needed for allergies.       Marland Kitchen ibuprofen (ADVIL,MOTRIN) 200 MG tablet Take 800 mg by mouth every 6 (six) hours as needed. pain      . Multiple Vitamin (MULTIVITAMIN) capsule Take 1 capsule by mouth daily.        . Omega-3 Fatty Acids (FISH OIL PO) Take by mouth daily.        . pantoprazole (PROTONIX) 40 MG tablet TAKE 1 TABLET BY MOUTH ONCE DAILY AS NEEDED  30 tablet  5   No current facility-administered medications for this visit.   Past Medical History  Diagnosis Date  . Allergy     allergic rhinitis  . GERD (gastroesophageal reflux disease)   . Hyperlipidemia   . Anxiety     situational  . Status post endometrial ablation       Family History  Problem Relation Age of Onset  . Cancer Mother     CA insitu of appendix  . Heart disease Father     CAD  . Diabetes Father     type II  . Diabetes Brother     type II  . Diabetes Maternal Grandfather   . Diabetes Paternal Grandmother   . Diabetes Paternal Grandfather     History   Social History  . Marital Status: Married    Spouse Name: N/A    Number of Children: N/A  . Years of Education: N/A   Occupational History  . Not on file.   Social History Main Topics  . Smoking status: Former Smoker    Quit date: 07/25/1983  . Smokeless tobacco: Never Used  . Alcohol Use: 5.0 oz/week    10 drink(s) per week     Comment: 7-10 drinks a week (alcohol, wine, or beer)/ daily  . Drug Use: No  . Sexual Activity: Yes    Partners: Male    Birth Control/ Protection: Surgical     Comment: Tubal ligation   Other Topics Concern  . Not on file  Social History Narrative  . No narrative on file    Past Surgical History  Procedure Laterality Date  . Cesarean section    . Foot surgery    . Tonsillectomy      as child  . Endometrial ablation      8/12  . Tubal ligation  8/12  . Laparoscopic tubal ligation  06/09/2011    Procedure: LAPAROSCOPIC TUBAL LIGATION;  Surgeon: Melony Overly;  Location: [facility];  Service: Gynecology;  Laterality: N/A;      (Not in a hospital admission)  Review of systems complete and found to be negative unless listed above .  No nausea, vomiting.  No fever chills, No focal weakness,  No palpitations.  Physical Exam: Filed Vitals:   09/04/13 1450  BP: 116/72  Pulse: 104    Weight: 188 lb (85.276 kg)  Physical exam:  [place]/AT EOMI No JVD, No carotid bruit RRR S1S2  No wheezing Soft. NT, nondistended No edema. No focal motor or sensory deficits Normal affect  Labs:   Lab Results  Component Value Date   WBC 10.0 08/07/2013   HGB 14.3 08/07/2013   HCT 42.0 08/07/2013   MCV 94.0 08/07/2013   PLT 281.0 08/07/2013   No  results found for this basename: NA, K, CL, CO2, BUN, CREATININE, CALCIUM, LABALBU, PROT, BILITOT, ALKPHOS, ALT, AST, GLUCOSE,  in the last 168 hours No results found for this basename: CKTOTAL, CKMB, CKMBINDEX, TROPONINI    Lab Results  Component Value Date   CHOL 190 03/01/2013   CHOL 185 12/01/2012   CHOL 243* 03/28/2012   Lab Results  Component Value Date   HDL 65.80 03/01/2013   HDL 16.10 12/01/2012   HDL 96.04 03/28/2012   Lab Results  Component Value Date   LDLCALC 104* 03/01/2013   Lab Results  Component Value Date   TRIG 100.0 03/01/2013   TRIG 207.0* 12/01/2012   TRIG 130.0 03/28/2012   Lab Results  Component Value Date   CHOLHDL 3 03/01/2013   CHOLHDL 3 12/01/2012   CHOLHDL 4 03/28/2012   Lab Results  Component Value Date   LDLDIRECT 87.8 12/01/2012   LDLDIRECT 160.6 03/28/2012   LDLDIRECT 192.9 01/28/2012      Radiology:No cardiopulmonary disease in the past on CXR EKG: Normal sinus rhythm,  ST segment canges  ASSESSMENT AND PLAN:  Palpitations: So far, she has only had sinus tachycardia documented. We'll plan for 48 hour Holter monitor to see what her heart rate is doing throughout the day, including during sleep. Also to see if she is just having sinus tachycardia or is having some type of SVT which is causing her fast heart rate.  Shortness of breath:  Evaluate echocardiogram to look for structural heart disease as the cause of her shortness of breath.  Signed:   Fredric Mare, MD, [facility] 09/04/2013, 3:10 PM

## 2013-09-04 NOTE — Patient Instructions (Signed)
Your physician has recommended that you wear a holter monitor. Holter monitors are medical devices that record the heart's electrical activity. Doctors most often use these monitors to diagnose arrhythmias. Arrhythmias are problems with the speed or rhythm of the heartbeat. The monitor is a small, portable device. You can wear one while you do your normal daily activities. This is usually used to diagnose what is causing palpitations/syncope (passing out).  Your physician has requested that you have an echocardiogram. Echocardiography is a painless test that uses sound waves to create images of your heart. It provides your doctor with information about the size and shape of your heart and how well your heart's chambers and valves are working. This procedure takes approximately one hour. There are no restrictions for this procedure.

## 2013-09-22 ENCOUNTER — Encounter (INDEPENDENT_AMBULATORY_CARE_PROVIDER_SITE_OTHER): Payer: 59

## 2013-09-22 ENCOUNTER — Encounter: Payer: Self-pay | Admitting: Radiology

## 2013-09-22 DIAGNOSIS — R002 Palpitations: Secondary | ICD-10-CM

## 2013-09-22 NOTE — Progress Notes (Signed)
Patient ID: Jamie Coleman, female   DOB: 06-Mar-1971, 42 y.o.   MRN: [id number] E cardio 48hr holter monitor applied

## 2013-09-27 ENCOUNTER — Ambulatory Visit (HOSPITAL_COMMUNITY): Payer: 59 | Attending: Cardiology | Admitting: Radiology

## 2013-09-27 ENCOUNTER — Encounter: Payer: Self-pay | Admitting: Cardiology

## 2013-09-27 DIAGNOSIS — R0602 Shortness of breath: Secondary | ICD-10-CM | POA: Insufficient documentation

## 2013-09-27 DIAGNOSIS — R002 Palpitations: Secondary | ICD-10-CM | POA: Insufficient documentation

## 2013-09-27 NOTE — Progress Notes (Signed)
Echocardiogram performed.  

## 2013-10-11 ENCOUNTER — Telehealth: Payer: Self-pay | Admitting: Cardiology

## 2013-10-11 NOTE — Telephone Encounter (Signed)
lmtrc

## 2013-10-11 NOTE — Telephone Encounter (Signed)
Dr. Hoyle Barr Interpretation: No Arrythmia with fluttering sx.

## 2013-10-16 NOTE — Telephone Encounter (Signed)
Follow Up:  Pt is calling for test results. Pt states she is going back into work but the nurse can call anytime tomorrow. Pt will be off.

## 2013-10-18 NOTE — Telephone Encounter (Signed)
lmtrc

## 2013-10-20 ENCOUNTER — Telehealth: Payer: Self-pay | Admitting: Interventional Cardiology

## 2013-10-20 NOTE — Telephone Encounter (Signed)
New message     Returning Amy's call for test results.  Ok to have Amy call her monday

## 2013-10-24 NOTE — Telephone Encounter (Signed)
See note on echo, pt was notified.

## 2013-10-24 NOTE — Telephone Encounter (Signed)
Pt aware, see echo notes.

## 2013-12-18 ENCOUNTER — Other Ambulatory Visit: Payer: Self-pay | Admitting: Family Medicine

## 2013-12-18 MED ORDER — ALPRAZOLAM 0.5 MG PO TABS: 0.5000 mg | ORAL_TABLET | Freq: Two times a day (BID) | ORAL | Status: DC | PRN

## 2013-12-18 NOTE — Telephone Encounter (Signed)
Rx called in as prescribed 

## 2013-12-18 NOTE — Telephone Encounter (Signed)
Px written for call in   

## 2013-12-25 ENCOUNTER — Ambulatory Visit (INDEPENDENT_AMBULATORY_CARE_PROVIDER_SITE_OTHER): Payer: 59 | Admitting: Internal Medicine

## 2013-12-25 ENCOUNTER — Encounter: Payer: Self-pay | Admitting: Internal Medicine

## 2013-12-25 VITALS — BP 112/78 | HR 96 | Temp 98.0°F | Wt 186.0 lb

## 2013-12-25 DIAGNOSIS — H109 Unspecified conjunctivitis: Secondary | ICD-10-CM

## 2013-12-25 NOTE — Progress Notes (Signed)
Pre visit review using our clinic review tool, if applicable. No additional management support is needed unless otherwise documented below in the visit note. 

## 2013-12-25 NOTE — Patient Instructions (Addendum)
Viral Conjunctivitis °Conjunctivitis is an irritation (inflammation) of the clear membrane that covers the white part of the eye (the conjunctiva). The irritation can also happen on the underside of the eyelids. Conjunctivitis makes the eye red or pink in color. This is what is commonly known as pink eye. Viral conjunctivitis can spread easily (contagious). °CAUSES  °· Infection from virus on the surface of the eye. °· Infection from the irritation or injury of nearby tissues such as the eyelids or cornea. °· More serious inflammation or infection on the inside of the eye. °· Other eye diseases. °· The use of certain eye medications. °SYMPTOMS  °The normally white color of the eye or the underside of the eyelid is usually pink or red in color. The pink eye is usually associated with irritation, tearing and some sensitivity to light. Viral conjunctivitis is often associated with a clear, watery discharge. If a discharge is present, there may also be some blurred vision in the affected eye. °DIAGNOSIS  °Conjunctivitis is diagnosed by an eye exam. The eye specialist looks for changes in the surface tissues of the eye which take on changes characteristic of the specific types of conjunctivitis. A sample of any discharge may be collected on a Q-Tip (sterile swap). The sample will be sent to a lab to see whether or not the inflammation is caused by bacterial or viral infection. °TREATMENT  °Viral conjunctivitis will not respond to medicines that kill germs (antibiotics). Treatment is aimed at stopping a bacterial infection on top of the viral infection. The goal of treatment is to relieve symptoms (such as itching) with antihistamine drops or other eye medications.  °HOME CARE INSTRUCTIONS  °· To ease discomfort, apply a cool, clean wash cloth to your eye for 10 to 20 minutes, 3 to 4 times a day. °· Gently wipe away any drainage from the eye with a warm, wet washcloth or a cotton ball. °· Wash your hands often with soap  and use paper towels to dry. °· Do not share towels or washcloths. This may spread the infection. °· Change or wash your pillowcase every day. °· You should not use eye make-up until the infection is gone. °· Stop using contacts lenses. Ask your eye professional how to sterilize or replace them before using again. This depends on the type of contact lenses used. °· Do not touch the edge of the eyelid with the eye drop bottle or ointment tube when applying medications to the affected eye. This will stop you from spreading the infection to the other eye or to others. °SEEK IMMEDIATE MEDICAL CARE IF:  °· The infection has not improved within 3 days of beginning treatment. °· A watery discharge from the eye develops. °· Pain in the eye increases. °· The redness is spreading. °· Vision becomes blurred. °· An oral temperature above 102° F (38.9° C) develops, or as your caregiver suggests. °· Facial pain, redness or swelling develops. °· Any problems that may be related to the prescribed medicine develop. °MAKE SURE YOU:  °· Understand these instructions. °· Will watch your condition. °· Will get help right away if you are not doing well or get worse. °Document Released: 09/28/2005 Document Revised: 12/21/2011 Document Reviewed: 05/17/2008 °ExitCare® Patient Information ©2014 [employer]. ° °

## 2013-12-25 NOTE — Progress Notes (Signed)
Subjective:    Patient ID: Jamie Coleman, female    DOB: 07-12-1971, 43 y.o.   MRN: [id number]  HPI  Pt presents to the clinic today with c/o redness and irritation to bilateral eyes. This started. She reports this am, her eyes were crusted shut. She has had cold symptoms 1 week prior to this.  Review of Systems      Past Medical History  Diagnosis Date  . Allergy     allergic rhinitis  . GERD (gastroesophageal reflux disease)   . Hyperlipidemia   . Anxiety     situational  . Status post endometrial ablation     Current Outpatient Prescriptions  Medication Sig Dispense Refill  . ALPRAZolam (XANAX) 0.5 MG tablet Take 1 tablet (0.5 mg total) by mouth 2 (two) times daily as needed for anxiety.  15 tablet  0  . Calcium Carbonate-Vit D-Min 600-400 MG-UNIT TABS Take 1 tablet by mouth 2 (two) times daily.       . cetirizine (ZYRTEC) 10 MG tablet Take 10 mg by mouth daily as needed for allergies.       Marland Kitchen ibuprofen (ADVIL,MOTRIN) 200 MG tablet Take 800 mg by mouth every 6 (six) hours as needed. pain      . Multiple Vitamin (MULTIVITAMIN) capsule Take 1 capsule by mouth daily.        . Omega-3 Fatty Acids (FISH OIL PO) Take by mouth daily.        . pantoprazole (PROTONIX) 40 MG tablet TAKE 1 TABLET BY MOUTH ONCE DAILY AS NEEDED  30 tablet  5   No current facility-administered medications for this visit.    Allergies  Allergen Reactions  . Lipitor [Atorvastatin]     myalgias    Family History  Problem Relation Age of Onset  . Cancer Mother     CA insitu of appendix  . Heart disease Father     CAD  . Diabetes Father     type II  . Diabetes Brother     type II  . Diabetes Maternal Grandfather   . Diabetes Paternal Grandmother   . Diabetes Paternal Grandfather     History   Social History  . Marital Status: Married    Spouse Name: N/A    Number of Children: N/A  . Years of Education: N/A   Occupational History  . Not on file.   Social History Main Topics  .  Smoking status: Former Smoker    Quit date: 07/25/1983  . Smokeless tobacco: Never Used  . Alcohol Use: 5.0 oz/week    10 drink(s) per week     Comment: 7-10 drinks a week (alcohol, wine, or beer)/ daily  . Drug Use: No  . Sexual Activity: Yes    Partners: Male    Birth Control/ Protection: Surgical     Comment: Tubal ligation   Other Topics Concern  . Not on file   Social History Narrative  . No narrative on file     Constitutional: Denies fever, malaise, fatigue, headache or abrupt weight changes.  HEENT: Pt reports eye redness. Denies eye pain, ear pain, ringing in the ears, wax buildup, runny nose, nasal congestion, bloody nose, or sore throat.   No other specific complaints in a complete review of systems (except as listed in HPI above).  Objective:   Physical Exam  BP 112/78  Pulse 96  Temp(Src) 98 F (36.7 C) (Oral)  Wt 186 lb (84.369 kg)  SpO2 98% Wt Readings from  Last 3 Encounters:  12/25/13 186 lb (84.369 kg)  09/04/13 188 lb (85.276 kg)  08/07/13 190 lb 8 oz (86.41 kg)    General: Appears her stated age, well developed, well nourished in NAD. HEENT: Head: normal shape and size; Eyes: sclera injected, no icterus, conjunctiva erythematous PERRLA and EOMs intact; Ears: Tm's gray and intact, normal light reflex; Nose: mucosa pink and moist, septum midline; Throat/Mouth: Teeth present, mucosa pink and moist, no exudate, lesions or ulcerations noted.  Cardiovascular: Normal rate and rhythm. S1,S2 noted.  No murmur, rubs or gallops noted. No JVD or BLE edema. No carotid bruits noted. Pulmonary/Chest: Normal effort and positive vesicular breath sounds. No respiratory distress. No wheezes, rales or ronchi noted.    BMET    Component Value Date/Time   NA 139 08/07/2013 1554   K 4.4 08/07/2013 1554   CL 105 08/07/2013 1554   CO2 23 08/07/2013 1554   GLUCOSE 120* 08/07/2013 1554   BUN 11 08/07/2013 1554   CREATININE 0.9 08/07/2013 1554   CALCIUM 9.7 08/07/2013  1554   GFRNONAA >90 05/04/2012 1450   GFRAA >90 05/04/2012 1450    Lipid Panel     Component Value Date/Time   CHOL 190 03/01/2013 0807   TRIG 100.0 03/01/2013 0807   HDL 65.80 03/01/2013 0807   CHOLHDL 3 03/01/2013 0807   VLDL 20.0 03/01/2013 0807   LDLCALC 104* 03/01/2013 0807    CBC    Component Value Date/Time   WBC 10.0 08/07/2013 1554   RBC 4.47 08/07/2013 1554   HGB 14.3 08/07/2013 1554   HCT 42.0 08/07/2013 1554   PLT 281.0 08/07/2013 1554   MCV 94.0 08/07/2013 1554   MCH 31.6 05/04/2012 1450   MCHC 33.9 08/07/2013 1554   RDW 12.7 08/07/2013 1554   LYMPHSABS 1.6 08/07/2013 1554   MONOABS 0.5 08/07/2013 1554   EOSABS 0.0 08/07/2013 1554   BASOSABS 0.1 08/07/2013 1554    Hgb A1C Lab Results  Component Value Date   HGBA1C 5.6 01/28/2012         Assessment & Plan:   Conjunctivitis, viral:  Warm compressed TID for comfort Ibuprofen BID for pain/inflammation Avoid touching your eyes because this is very contagious  RTC as needed or if symptoms persist or worsen

## 2014-02-19 ENCOUNTER — Other Ambulatory Visit: Payer: Self-pay | Admitting: Family Medicine

## 2014-02-19 NOTE — Telephone Encounter (Signed)
Please refill for 6 mo 

## 2014-02-19 NOTE — Telephone Encounter (Signed)
Electronic refill request, no recent or future f/u or CPE appt, please advise

## 2014-02-19 NOTE — Telephone Encounter (Signed)
done

## 2014-03-05 ENCOUNTER — Other Ambulatory Visit: Payer: Self-pay | Admitting: Family Medicine

## 2014-03-06 MED ORDER — ALPRAZOLAM 0.5 MG PO TABS: 0.5000 mg | ORAL_TABLET | Freq: Two times a day (BID) | ORAL | Status: DC | PRN

## 2014-03-06 NOTE — Telephone Encounter (Signed)
Last office visit 12/25/2013 with Webb Silversmith.  Last refilled 12/18/2013 for #15. Ok to refill?

## 2014-03-06 NOTE — Telephone Encounter (Signed)
Px written for call in   

## 2014-03-06 NOTE — Telephone Encounter (Signed)
Rx called in as prescribed 

## 2014-04-17 ENCOUNTER — Encounter: Payer: Self-pay | Admitting: Family Medicine

## 2014-04-17 ENCOUNTER — Ambulatory Visit (INDEPENDENT_AMBULATORY_CARE_PROVIDER_SITE_OTHER): Payer: 59 | Admitting: Family Medicine

## 2014-04-17 VITALS — BP 128/82 | HR 84 | Temp 98.9°F | Ht 62.5 in | Wt 187.5 lb

## 2014-04-17 DIAGNOSIS — J0101 Acute recurrent maxillary sinusitis: Secondary | ICD-10-CM

## 2014-04-17 DIAGNOSIS — J0100 Acute maxillary sinusitis, unspecified: Secondary | ICD-10-CM

## 2014-04-17 DIAGNOSIS — J019 Acute sinusitis, unspecified: Secondary | ICD-10-CM | POA: Insufficient documentation

## 2014-04-17 MED ORDER — AMOXICILLIN-POT CLAVULANATE 875-125 MG PO TABS: 1.0000 | ORAL_TABLET | Freq: Two times a day (BID) | ORAL | Status: DC

## 2014-04-17 NOTE — Patient Instructions (Signed)
Take augmentin for sinusitis  Drink fluids and continue expectorant and netti pot  Update if not starting to improve in a week or if worsening

## 2014-04-17 NOTE — Progress Notes (Signed)
Subjective:    Patient ID: Jamie Coleman, female    DOB: 30-Nov-1970, 43 y.o.   MRN: [id number]  HPI Has symptoms of a sinus infection  No fever  Started as a cold - around 2 weeks ago -and is hanging on  Better and then worse  Sort throat/ congestion - much worse on Monday - very green nasal discharge - post nasal drip/ cannot get sinuses drained guifenisen bid and netti pot and allergy med  Pressure Some cough- when talking   Patient Active Problem List   Diagnosis Date Noted  . Exercise intolerance 08/07/2013  . Tremor 06/30/2013  . Mouth pain 03/01/2013  . Sebaceous cyst 02/15/2013  . Actinic keratosis of right cheek 02/15/2013  . Tingling 06/24/2012  . Tachycardia 01/30/2011  . Dizziness 01/30/2011  . OTHER&UNSPECIFIED DISEASES THE ORAL SOFT TISSUES 07/25/2008  . DERMATOFIBROMA, ARM 11/11/2007  . NEOPLASM, SKIN, UNCERTAIN BEHAVIOR 41/96/2229  . HYPERLIPIDEMIA 02/10/2007  . ANXIETY, SITUATIONAL 02/10/2007  . ALLERGIC RHINITIS 02/10/2007  . GERD 02/10/2007  . IBS 02/10/2007  . POSTPARTUM DEPRESSION 02/10/2007   Past Medical History  Diagnosis Date  . Allergy     allergic rhinitis  . GERD (gastroesophageal reflux disease)   . Hyperlipidemia   . Anxiety     situational  . Status post endometrial ablation    Past Surgical History  Procedure Laterality Date  . Cesarean section    . Foot surgery    . Tonsillectomy      as child  . Endometrial ablation      8/12  . Tubal ligation  8/12  . Laparoscopic tubal ligation  06/09/2011    Procedure: LAPAROSCOPIC TUBAL LIGATION;  Surgeon: Arloa Koh;  Location: [facility];  Service: Gynecology;  Laterality: N/A;   History  Substance Use Topics  . Smoking status: Former Smoker    Quit date: 07/25/1983  . Smokeless tobacco: Never Used  . Alcohol Use: 5.0 oz/week    10 drink(s) per week     Comment: 7-10 drinks a week (alcohol, wine, or beer)/ daily   Family History  Problem Relation Age of Onset  . Cancer Mother      CA insitu of appendix  . Heart disease Father     CAD  . Diabetes Father     type II  . Diabetes Brother     type II  . Diabetes Maternal Grandfather   . Diabetes Paternal Grandmother   . Diabetes Paternal Grandfather    Allergies  Allergen Reactions  . Lipitor [Atorvastatin]     myalgias   Current Outpatient Prescriptions on File Prior to Visit  Medication Sig Dispense Refill  . ALPRAZolam (XANAX) 0.5 MG tablet Take 1 tablet (0.5 mg total) by mouth 2 (two) times daily as needed for anxiety.  15 tablet  1  . Calcium Carbonate-Vit D-Min 600-400 MG-UNIT TABS Take 1 tablet by mouth 2 (two) times daily.       . cetirizine (ZYRTEC) 10 MG tablet Take 10 mg by mouth daily as needed for allergies.       Marland Kitchen ibuprofen (ADVIL,MOTRIN) 200 MG tablet Take 800 mg by mouth every 6 (six) hours as needed. pain      . Multiple Vitamin (MULTIVITAMIN) capsule Take 1 capsule by mouth daily.        . Omega-3 Fatty Acids (FISH OIL PO) Take by mouth daily.        . pantoprazole (PROTONIX) 40 MG tablet TAKE 1 TABLET BY MOUTH  ONCE DAILY AS NEEDED  30 tablet  5   No current facility-administered medications on file prior to visit.     Review of Systems    Review of Systems  Constitutional: Negative for fever, appetite change,  and unexpected weight change. pos for malaise  Eyes: Negative for pain and visual disturbance.  ENT pos for nasal cong with purulent drainage  Respiratory: Negative for cough and shortness of breath.   Cardiovascular: Negative for cp or palpitations    Gastrointestinal: Negative for nausea, diarrhea and constipation.  Genitourinary: Negative for urgency and frequency.  Skin: Negative for pallor or rash   Neurological: Negative for weakness, light-headedness, numbness and headaches.  Hematological: Negative for adenopathy. Does not bruise/bleed easily.  Psychiatric/Behavioral: Negative for dysphoric mood. The patient is not nervous/anxious.      Objective:   Physical Exam    Constitutional: She appears well-developed and well-nourished. No distress.  obese and well appearing   HENT:  Head: Normocephalic and atraumatic.  Right Ear: External ear normal.  Left Ear: External ear normal.  Mouth/Throat: Oropharynx is clear and moist. No oropharyngeal exudate.  Nares are injected and congested  -worse on L  No sinus tenderness  Throat clear  Hoarse voice   Eyes: Conjunctivae and EOM are normal. Pupils are equal, round, and reactive to light. Right eye exhibits no discharge. Left eye exhibits no discharge.  Neck: Normal range of motion. Neck supple.  Cardiovascular: Normal rate and regular rhythm.   Pulmonary/Chest: Effort normal and breath sounds normal. No respiratory distress. She has no wheezes. She has no rales.  Lymphadenopathy:    She has no cervical adenopathy.  Neurological: She is alert. No cranial nerve deficit.  Skin: Skin is warm and dry. No rash noted.  Psychiatric: She has a normal mood and affect.          Assessment & Plan:   Problem List Items Addressed This Visit     Respiratory   Acute sinusitis - Primary     Treat with augmentin  Disc symptomatic care - see instructions on AVS  Continue expectorant and nasal saline irrigations Update if not starting to improve in a week or if worsening      Relevant Medications      amoxicillin-clavulanate (AUGMENTIN) tablet 875-125 mg

## 2014-04-17 NOTE — Progress Notes (Signed)
Pre visit review using our clinic review tool, if applicable. No additional management support is needed unless otherwise documented below in the visit note. 

## 2014-04-17 NOTE — Assessment & Plan Note (Signed)
Treat with augmentin  Disc symptomatic care - see instructions on AVS  Continue expectorant and nasal saline irrigations Update if not starting to improve in a week or if worsening

## 2014-05-17 ENCOUNTER — Encounter: Payer: Self-pay | Admitting: Family Medicine

## 2014-05-17 ENCOUNTER — Ambulatory Visit (INDEPENDENT_AMBULATORY_CARE_PROVIDER_SITE_OTHER): Payer: 59 | Admitting: Family Medicine

## 2014-05-17 VITALS — BP 120/82 | HR 67 | Temp 98.6°F | Ht 62.5 in | Wt 188.8 lb

## 2014-05-17 DIAGNOSIS — M7500 Adhesive capsulitis of unspecified shoulder: Secondary | ICD-10-CM

## 2014-05-17 DIAGNOSIS — M7502 Adhesive capsulitis of left shoulder: Secondary | ICD-10-CM

## 2014-05-17 NOTE — Progress Notes (Signed)
[place] [address]                Phone: [phone]                  Fax: [phone] 05/17/2014  ID: Carol Theys   MRN: [id number]  DOB: [birth year]  Primary Physician:  Loura Pardon, MD  Chief Complaint: Shoulder Pain  Subjective:   History of Present Illness:  Jamie Coleman is a 43 y.o. very pleasant female patient who presents with the following: shoulder pain  The patient noted above presents with shoulder pain that has been ongoing for few months there is no history of trauma or accident. The patient denies neck pain or radicular symptoms. No shoulder blade pain Denies dislocation, subluxation, separation of the shoulder. The patient does not complain of pain in the overhead plane. Significant restriction of motion.  Medications Tried: Tylenol, NSAIDS Ice or Heat: minimal help Tried PT: In past for R RTC tendinopathy  Prior shoulder Injury: R, few years ago Prior surgery: No Prior fracture: No  Past Medical History, Surgical History, Social History, Family History, Medications, and allergies reviewed and updated if relevant.   Review of Systems  GEN: No fevers, chills. Nontoxic. Primarily MSK c/o today. MSK: Detailed in the HPI GI: tolerating PO intake without difficulty Neuro: No numbness, parasthesias, or tingling associated. Otherwise the pertinent positives of the ROS are noted above.     Objective:   Physical Examination Filed Vitals:   05/17/14 1720  BP: 120/82  Pulse: 67  Temp: 98.6 F (37 C)  TempSrc: Oral  Height: 5' 2.5" (1.588 m)  Weight: 188 lb 12 oz (85.616 kg)    GEN: Well-developed,well-nourished,in no acute distress; alert,appropriate and cooperative throughout examination HEENT: Normocephalic and atraumatic without obvious abnormalities. Ears, externally no deformities PULM: Breathing comfortably in no respiratory distress EXT: No clubbing, cyanosis, or edema PSYCH: Normally interactive.  Cooperative during the interview. Pleasant. Friendly and conversant. Not anxious or depressed appearing. Normal, full affect.  Shoulder: L Inspection: No muscle wasting or winging Ecchymosis/edema: neg  AC joint, scapula, clavicle: NT Cervical spine: NT, full ROM Spurling's: neg Abduction: 5/5, 100 deg Flexion: 5/5, 100 deg IR, full, lift-off: 5/5, none at 90 deg abd ER at neutral: 5/5, loss of 35 deg Special testing is equivocal Supraspinatus insertion: NT Bicipital groove: NT C5-T1 intact Sensation intact Grip 5/5  Assessment & Plan:   Frozen shoulder, left  >25 minutes spent in face to face time with patient, >50% spent in counselling or coordination of care  Patient was given a systematic ROM protocol from [facility] or [facility] to be done daily. Emphasized importance of adherence, help of PT, daily HEP.  The average length of total symptoms is 12-18 months going through 3 different phases in the freezing and thawing process. Reviewed all with patient.   Tylenol or NSAID of choice prn for pain relief Intraarticular shoulder injections discussed with patient, which have good evidence for accelerating the thawing phase.  Formal PT for aggressive frozen shoulder ROM - patient with prior PT for R shoulder injury in past, start with HEP Will need RTC str and scapular stabilization to fix underlying mechanics.  Intrarticular Shoulder Injection, LEFT Verbal consent was obtained from the patient. Risks including infection explained and contrasted with benefits and alternatives. Patient prepped with Chloraprep and Ethyl Chloride  used for anesthesia. An intraarticular shoulder injection was performed using the posterior approach. The patient tolerated the procedure well and had decreased pain post injection. No complications. Injection: 8 cc of Lidocaine 1% and Depo-Medrol 80 mg. Needle: 22 gauge   New Prescriptions   No medications on file   Discontinued Medications    AMOXICILLIN-CLAVULANATE (AUGMENTIN) 875-125 MG PER TABLET    Take 1 tablet by mouth 2 (two) times daily.   Modified Medications   No medications on file   Signed,  Maud Deed. Marayah Higdon, MD, [facility]  No orders of the defined types were placed in this encounter.   Current Medications at Discharge:   Medication List       This list is accurate as of: 05/17/14 11:59 PM.  Always use your most recent med list.               ALPRAZolam 0.5 MG tablet  Commonly known as:  XANAX  Take 1 tablet (0.5 mg total) by mouth 2 (two) times daily as needed for anxiety.     Calcium Carbonate-Vit D-Min 600-400 MG-UNIT Tabs  Take 1 tablet by mouth 2 (two) times daily.     cetirizine 10 MG tablet  Commonly known as:  ZYRTEC  Take 10 mg by mouth daily as needed for allergies.     FISH OIL PO  Take by mouth daily.     ibuprofen 200 MG tablet  Commonly known as:  ADVIL,MOTRIN  Take 800 mg by mouth every 6 (six) hours as needed. pain     multivitamin capsule  Take 1 capsule by mouth daily.     pantoprazole 40 MG tablet  Commonly known as:  PROTONIX  TAKE 1 TABLET BY MOUTH ONCE DAILY AS NEEDED       Follow-up: Return in about 2 months (around 07/17/2014). Unless noted above, the patient is to follow-up if symptoms worsen. Red flags were reviewed with the patient.

## 2014-05-17 NOTE — Progress Notes (Signed)
Pre visit review using our clinic review tool, if applicable. No additional management support is needed unless otherwise documented below in the visit note. 

## 2014-07-25 ENCOUNTER — Encounter: Payer: Self-pay | Admitting: Family Medicine

## 2014-07-25 ENCOUNTER — Ambulatory Visit (INDEPENDENT_AMBULATORY_CARE_PROVIDER_SITE_OTHER): Payer: 59 | Admitting: Family Medicine

## 2014-07-25 VITALS — BP 100/64 | HR 81 | Temp 98.6°F | Ht 62.5 in | Wt 190.2 lb

## 2014-07-25 DIAGNOSIS — M7502 Adhesive capsulitis of left shoulder: Secondary | ICD-10-CM

## 2014-07-25 NOTE — Progress Notes (Signed)
Pre visit review using our clinic review tool, if applicable. No additional management support is needed unless otherwise documented below in the visit note. 

## 2014-07-25 NOTE — Progress Notes (Signed)
[place] [address]                Phone: [phone]                  Fax: [phone] 07/25/2014  ID: Jamie Coleman   MRN: [id number]  DOB: 20-Feb-1971  Primary Physician:  Loura Pardon, MD  Chief Complaint: Follow-up  Subjective:   History of Present Illness:  Jamie Coleman is a 43 y.o. very pleasant female patient who presents with the following: shoulder pain  F/u L frozen shoulder, still with dull ache and pain, progression of ROM. Patient is frustrated some and wanted to check shoulder again in f/u. Very compliant with HEP. ROM improving.  05/17/2014 Last OV with Owens Loffler, MD  The patient noted above presents with shoulder pain that has been ongoing for few months there is no history of trauma or accident. The patient denies neck pain or radicular symptoms. No shoulder blade pain Denies dislocation, subluxation, separation of the shoulder. The patient does not complain of pain in the overhead plane. Significant restriction of motion.  Medications Tried: Tylenol, NSAIDS Ice or Heat: minimal help Tried PT: In past for R RTC tendinopathy  Prior shoulder Injury: R, few years ago Prior surgery: No Prior fracture: No  Past Medical History, Surgical History, Social History, Family History, Medications, and allergies reviewed and updated if relevant.   Review of Systems  GEN: No fevers, chills. Nontoxic. Primarily MSK c/o today. MSK: Detailed in the HPI GI: tolerating PO intake without difficulty Neuro: No numbness, parasthesias, or tingling associated. Otherwise the pertinent positives of the ROS are noted above.     Objective:   Physical Examination Filed Vitals:   07/25/14 1531  BP: 100/64  Pulse: 81  Temp: 98.6 F (37 C)  TempSrc: Oral  Height: 5' 2.5" (1.588 m)  Weight: 190 lb 4 oz (86.297 kg)    GEN: Well-developed,well-nourished,in no acute distress; alert,appropriate and cooperative throughout  examination HEENT: Normocephalic and atraumatic without obvious abnormalities. Ears, externally no deformities PULM: Breathing comfortably in no respiratory distress EXT: No clubbing, cyanosis, or edema PSYCH: Normally interactive. Cooperative during the interview. Pleasant. Friendly and conversant. Not anxious or depressed appearing. Normal, full affect.  Shoulder: L Inspection: No muscle wasting or winging Ecchymosis/edema: neg  AC joint, scapula, clavicle: NT Cervical spine: NT, full ROM Spurling's: neg Abduction: 5/5, 165 deg Flexion: 5/5, 160 deg IR, full, lift-off: 5/5, 30 deg at 90 deg abd ER at neutral: 5/5, full Special testing is equivocal Supraspinatus insertion: NT Bicipital groove: NT C5-T1 intact Sensation intact Grip 5/5  Assessment & Plan:   Frozen shoulder, left - Plan: Ambulatory referral to Physical Therapy  She is actually making fantastic progress. Great improvement with ROM. Reviewed frozen shoulder with her again.  PT to assist with manual ROM  Signed,  Anav Lammert T. Shaguana Love, MD, [facility]  Orders Placed This Encounter  Procedures  . Ambulatory referral to Physical Therapy   Current Medications at Discharge:   Medication List       This list is accurate as of: 07/25/14 11:59 PM.  Always use your most recent med list.               ALPRAZolam 0.5 MG tablet  Commonly known as:  XANAX  Take 1 tablet (0.5 mg total) by  mouth 2 (two) times daily as needed for anxiety.     Calcium Carbonate-Vit D-Min 600-400 MG-UNIT Tabs  Take 1 tablet by mouth 2 (two) times daily.     cetirizine 10 MG tablet  Commonly known as:  ZYRTEC  Take 10 mg by mouth daily as needed for allergies.     FISH OIL PO  Take by mouth daily.     ibuprofen 200 MG tablet  Commonly known as:  ADVIL,MOTRIN  Take 800 mg by mouth every 6 (six) hours as needed. pain     multivitamin capsule  Take 1 capsule by mouth daily.     pantoprazole 40 MG tablet  Commonly  known as:  PROTONIX  TAKE 1 TABLET BY MOUTH ONCE DAILY AS NEEDED       Follow-up: No Follow-up on file. Unless noted above, the patient is to follow-up if symptoms worsen. Red flags were reviewed with the patient.

## 2014-07-25 NOTE — Patient Instructions (Signed)

## 2014-07-27 ENCOUNTER — Encounter: Payer: Self-pay | Admitting: Obstetrics and Gynecology

## 2014-07-27 ENCOUNTER — Ambulatory Visit (INDEPENDENT_AMBULATORY_CARE_PROVIDER_SITE_OTHER): Payer: 59 | Admitting: Obstetrics and Gynecology

## 2014-07-27 VITALS — BP 118/62 | HR 80 | Resp 16 | Ht 62.25 in | Wt 190.0 lb

## 2014-07-27 DIAGNOSIS — Z0000 Encounter for general adult medical examination without abnormal findings: Secondary | ICD-10-CM

## 2014-07-27 DIAGNOSIS — Z01419 Encounter for gynecological examination (general) (routine) without abnormal findings: Secondary | ICD-10-CM

## 2014-07-27 LAB — POCT URINALYSIS DIPSTICK
Bilirubin, UA: NEGATIVE
Glucose, UA: NEGATIVE
Ketones, UA: NEGATIVE
Leukocytes, UA: NEGATIVE
Nitrite, UA: NEGATIVE
Protein, UA: NEGATIVE
Urobilinogen, UA: NEGATIVE
pH, UA: 5

## 2014-07-27 NOTE — Patient Instructions (Signed)

## 2014-07-27 NOTE — Progress Notes (Signed)
43 y.o. [id number] MarriedCaucasianF here for annual exam.    PCP - Dr. Glori Bickers.   Kids started high school this year.  Working in endoscopy.  Likes it.   Problems with frozen shoulder.  Left side.  Doing cortisone and PT.  History of endometrial ablation and BTL. Having spotting only with menses for 1 - 2 days.   Can be heavier.   Patient's last menstrual period was 07/24/2014.          Sexually active: Yes.    The current method of family planning is tubal ligation.    Exercising: Yes.    Walking Smoker:  no  Health Maintenance: Pap:  07/2013 Neg. HR HPV: Neg History of abnormal Pap:  Yes. Colposcopy (2011)  MMG:  08/2013 BIRADS1: Neg Colonoscopy:  Never BMD:  Never TDaP:  2006 - Will do through work.  Screening Labs: PCP, Hb today: PCP, Urine today: GYI:RSWNI - see menses date.    reports that she quit smoking about 31 years ago. She has never used smokeless tobacco. She reports that she drinks about 5 ounces of alcohol per week. She reports that she does not use illicit drugs.  Past Medical History  Diagnosis Date  . Allergy     allergic rhinitis  . GERD (gastroesophageal reflux disease)   . Hyperlipidemia   . Anxiety     situational  . Status post endometrial ablation     Past Surgical History  Procedure Laterality Date  . Cesarean section    . Foot surgery    . Tonsillectomy      as child  . Endometrial ablation      8/12  . Tubal ligation  8/12  . Laparoscopic tubal ligation  06/09/2011    Procedure: LAPAROSCOPIC TUBAL LIGATION;  Surgeon: Arloa Koh;  Location: [facility];  Service: Gynecology;  Laterality: N/A;    Current Outpatient Prescriptions  Medication Sig Dispense Refill  . ALPRAZolam (XANAX) 0.5 MG tablet Take 1 tablet (0.5 mg total) by mouth 2 (two) times daily as needed for anxiety.  15 tablet  1  . Calcium Carbonate-Vit D-Min 600-400 MG-UNIT TABS Take 1 tablet by mouth 2 (two) times daily.       . cetirizine (ZYRTEC) 10 MG tablet Take 10 mg by  mouth daily as needed for allergies.       Marland Kitchen ibuprofen (ADVIL,MOTRIN) 200 MG tablet Take 800 mg by mouth every 6 (six) hours as needed. pain      . Multiple Vitamin (MULTIVITAMIN) capsule Take 1 capsule by mouth daily.        . Omega-3 Fatty Acids (FISH OIL PO) Take by mouth daily.        . pantoprazole (PROTONIX) 40 MG tablet TAKE 1 TABLET BY MOUTH ONCE DAILY AS NEEDED  30 tablet  5   No current facility-administered medications for this visit.    Family History  Problem Relation Age of Onset  . Cancer Mother     CA insitu of appendix  . Heart disease Father     CAD  . Diabetes Father     type II  . Diabetes Brother     type II  . Diabetes Maternal Grandfather   . Diabetes Paternal Grandmother   . Diabetes Paternal Grandfather     ROS:  Pertinent items are noted in HPI.  Otherwise, a comprehensive ROS was negative.  Exam:   BP 118/62  Pulse 80  Resp 16  Ht 5' 2.25" (1.581 m)  Wt 190 lb (86.183 kg)  BMI 34.48 kg/m2  LMP 07/24/2014     Height: 5' 2.25" (158.1 cm)  Ht Readings from Last 3 Encounters:  07/27/14 5' 2.25" (1.581 m)  07/25/14 5' 2.5" (1.588 m)  05/17/14 5' 2.5" (1.588 m)    General appearance: alert, cooperative and appears stated age Head: Normocephalic, without obvious abnormality, atraumatic Neck: no adenopathy, supple, symmetrical, trachea midline and thyroid normal to inspection and palpation Lungs: clear to auscultation bilaterally Breasts: normal appearance, no masses or tenderness, Inspection negative, No nipple retraction or dimpling, No nipple discharge or bleeding, No axillary or supraclavicular adenopathy Heart: regular rate and rhythm Abdomen: soft, non-tender; bowel sounds normal; no masses,  no organomegaly Extremities: extremities normal, atraumatic, no cyanosis or edema Skin: Skin color, texture, turgor normal. No rashes or lesions Lymph nodes: Cervical, supraclavicular, and axillary nodes normal. No abnormal inguinal nodes  palpated Neurologic: Grossly normal  Pelvic: External genitalia:  no lesions              Urethra:  normal appearing urethra with no masses, tenderness or lesions              Bartholins and Skenes: normal                 Vagina: normal appearing vagina with normal color and discharge, no lesions              Cervix: anteverted              Pap taken: Yes.   Bimanual Exam:  Uterus:  normal size, contour, position, consistency, mobility, non-tender              Adnexa: normal adnexa and no mass, fullness, tenderness               Rectovaginal: Confirms               Anus:  normal sphincter tone, no lesions  A:  Well Woman with normal exam Status post ablation.  Status post BTL.  Semi-recent history of abnormal pap.   P:   Mammogram yearly. Will schedule at [facility].  pap smear and HR HPV taken.  return annually or prn  An After Visit Summary was printed and given to the patient.

## 2014-08-01 LAB — IPS PAP TEST WITH HPV

## 2014-08-07 ENCOUNTER — Ambulatory Visit: Payer: 59 | Attending: Family Medicine | Admitting: Physical Therapy

## 2014-08-07 DIAGNOSIS — R531 Weakness: Secondary | ICD-10-CM | POA: Insufficient documentation

## 2014-08-07 DIAGNOSIS — Z5189 Encounter for other specified aftercare: Secondary | ICD-10-CM | POA: Diagnosis present

## 2014-08-07 DIAGNOSIS — R293 Abnormal posture: Secondary | ICD-10-CM | POA: Diagnosis not present

## 2014-08-07 DIAGNOSIS — M7502 Adhesive capsulitis of left shoulder: Secondary | ICD-10-CM | POA: Insufficient documentation

## 2014-08-13 ENCOUNTER — Encounter: Payer: Self-pay | Admitting: Obstetrics and Gynecology

## 2014-08-21 ENCOUNTER — Ambulatory Visit: Payer: 59 | Attending: Family Medicine | Admitting: Physical Therapy

## 2014-08-21 DIAGNOSIS — Z5189 Encounter for other specified aftercare: Secondary | ICD-10-CM | POA: Insufficient documentation

## 2014-08-21 DIAGNOSIS — R293 Abnormal posture: Secondary | ICD-10-CM | POA: Insufficient documentation

## 2014-08-21 DIAGNOSIS — M7502 Adhesive capsulitis of left shoulder: Secondary | ICD-10-CM | POA: Insufficient documentation

## 2014-08-21 DIAGNOSIS — R531 Weakness: Secondary | ICD-10-CM | POA: Insufficient documentation

## 2014-08-23 ENCOUNTER — Ambulatory Visit: Payer: 59 | Admitting: Rehabilitation

## 2014-08-24 ENCOUNTER — Encounter: Payer: Self-pay | Admitting: Family Medicine

## 2014-08-24 ENCOUNTER — Ambulatory Visit (INDEPENDENT_AMBULATORY_CARE_PROVIDER_SITE_OTHER): Payer: 59 | Admitting: Family Medicine

## 2014-08-24 VITALS — BP 124/82 | HR 87 | Temp 98.4°F | Resp 14 | Wt 190.5 lb

## 2014-08-24 DIAGNOSIS — R21 Rash and other nonspecific skin eruption: Secondary | ICD-10-CM

## 2014-08-24 NOTE — Progress Notes (Signed)
Pre visit review using our clinic review tool, if applicable. No additional management support is needed unless otherwise documented below in the visit note.  Some facial rash over the last few days.  Feels like sand paper.  Recently noted.  Using OTC lotion and hydrocortisone.   Some on the face B, and the neck B.  Neck-anterior- itches. Face doesn't itch.    No triggers before the rash started other than using makeup (but she has used it before, though rarely).    She had gum B upper and lower irritation and some anterior neck LA last week, resolved now.  No tongue or lip lesions last week.   Rash has been on the forehead, hear the nasal creases, and near the mouth.    No hand or foot lesions.  No FCNAVD.  She feels okay o/w, other than some fatigue.    Benadryl helps the itching.   Meds, vitals, and allergies reviewed.   ROS: See HPI.  Otherwise, noncontributory.  nad ncat Rash, minimally visible but feels rough, on the forehead, hear the nasal creases, and near the mouth.   OP wnl Nasal exam wnl Neck supple, no LA rrr ctab

## 2014-08-24 NOTE — Patient Instructions (Signed)
Stop the creams and use benadryl orally if needed for itching.   Update [place] later if needed.  Take care.

## 2014-08-26 DIAGNOSIS — R21 Rash and other nonspecific skin eruption: Secondary | ICD-10-CM | POA: Insufficient documentation

## 2014-08-26 NOTE — Assessment & Plan Note (Signed)
This rash is more easily felt than seen.  D/w pt.  This could be a postviral rash, given the recent (resolved) oral changes.  Wouldn't intervene at this point.  If it continues, then she'll notify us.  I expect it to resolve w/o intervention.

## 2014-08-27 ENCOUNTER — Ambulatory Visit: Payer: 59

## 2014-08-27 DIAGNOSIS — M7502 Adhesive capsulitis of left shoulder: Secondary | ICD-10-CM | POA: Diagnosis not present

## 2014-08-27 DIAGNOSIS — R293 Abnormal posture: Secondary | ICD-10-CM | POA: Diagnosis not present

## 2014-08-27 DIAGNOSIS — Z5189 Encounter for other specified aftercare: Secondary | ICD-10-CM | POA: Diagnosis present

## 2014-08-27 DIAGNOSIS — M25512 Pain in left shoulder: Secondary | ICD-10-CM

## 2014-08-27 DIAGNOSIS — R531 Weakness: Secondary | ICD-10-CM | POA: Diagnosis not present

## 2014-08-27 NOTE — Therapy (Signed)
Physical Therapy Treatment  Patient Details  Name: Rachana Malesky MRN: [id number] Date of Birth: [birth year]  Encounter Date: 08/27/2014      PT End of Session - 08/27/14 1722    Visit Number 2   Number of Visits 12   Date for PT Re-Evaluation 09/20/14   PT Start Time 1633   PT Stop Time 1723   PT Time Calculation (min) 50 min   Activity Tolerance Patient tolerated treatment well      Past Medical History  Diagnosis Date  . Allergy     allergic rhinitis  . GERD (gastroesophageal reflux disease)   . Hyperlipidemia   . Anxiety     situational  . Status post endometrial ablation     Past Surgical History  Procedure Laterality Date  . Cesarean section    . Foot surgery    . Tonsillectomy      as child  . Endometrial ablation      8/12  . Tubal ligation  8/12  . Laparoscopic tubal ligation  06/09/2011    Procedure: LAPAROSCOPIC TUBAL LIGATION;  Surgeon: Arloa Koh;  Location: [facility];  Service: Gynecology;  Laterality: N/A;    There were no vitals taken for this visit.  Visit Diagnosis:  Left shoulder pain      Subjective Assessment - 08/27/14 1641    Symptoms Pt reports she has been doing her stretches at home, once daily. Pt notes good improvements since last visit. Imporvements noted: "less pain", rating 0/10 at rest, 8/10 at worst. Pt reports  most pain is when she is sitting still and arm is "hanging"/ Pt reports improved ease with donning and doffing clothes, is able to hook bra behind back with increased pain.    Pertinent History L shoulder pain with cortisone injection in 05/2014.   Limitations House hold activities   Currently in Pain? Yes   Pain Score 0-No pain   Pain Location Shoulder   Pain Orientation Left   Pain Descriptors / Indicators Aching;Sharp   Pain Type Acute pain   Pain Onset More than a month ago   Pain Frequency Intermittent   Aggravating Factors  hanging and reaching behind back   Pain Relieving Factors repositioning   Effect of  Pain on Daily Activities dressing            [facility] Adult PT Treatment/Exercise - 08/27/14 1707    Exercises   Exercises Shoulder   Shoulder Exercises: Pulleys   Flexion 2 minutes   ABduction 2 minutes   Other Pulley Exercises ER, 2 mins   Modalities   Modalities Moist Heat   Moist Heat Therapy   Number Minutes Moist Heat 10 Minutes   Moist Heat Location Shoulder  L   Manual Therapy   Manual Therapy Joint mobilization;Myofascial release   Joint Mobilization Post-Inf joint mobs and distraction to L GH joint   Myofascial Release STM to posterior shoulder , deltoid, teres Major and minor   Shoulder Exercises: Stretch   Corner Stretch 1 rep;30 seconds   Corner Stretch Limitations in doorway  for HEP   Cross Chest Stretch 1 rep;30 seconds  for HEP   Internal Rotation Stretch 30 seconds   Internal Rotation Stretch Limitations with towel behind back    External Rotation Stretch 1 rep;30 seconds          PT Education - 08/27/14 1721    Education provided Yes   Education Details Educated pt to perform HEP stretches for 3, 30  sec holds.    Person(s) Educated Patient   Methods Explanation;Demonstration;Tactile cues   Comprehension Verbalized understanding;Returned demonstration          PT Short Term Goals - 08/27/14 1727    PT SHORT TERM GOAL #1   Title all STGs=LTGs   Status On-going          PT Long Term Goals - 08/27/14 1727    PT LONG TERM GOAL #1   Title Demonstrate and/or verbalize techniques to reduce the risk of re-injury to include info on: posture, body mechanics, lifting.    Time 6   Period Weeks   Status On-going   PT LONG TERM GOAL #2   Title be indep with advanced HEP UE/ upper back strengthening   Time 6   Period Weeks   Status On-going   PT LONG TERM GOAL #3   Title increase ROM actively in L UE so that pt can don clothing eith minimal to no pain   Time 6   Period Weeks   Status On-going   PT LONG TERM GOAL #4   Title tolerate sitting  for greater than 1 hour o document with 2/10 pain or less   Time 6   Status On-going   PT LONG TERM GOAL #5   Title Improve FOTO from 35% limitations to 26% or less   Time 6   Period Weeks   Status On-going   Additional Long Term Goals   Additional Long Term Goals Yes   PT LONG TERM GOAL #6   Title report pain decrease in 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    Time 6   Period Weeks   Status On-going          Plan - 08/27/14 1723    Clinical Impression Statement Pt reports good improvements since first visit 3 weeks ago and since stretching with HEP.  Pt continues to be limited with end range pain, but also c/o of pain at rest, when shoulder is "hanging" without support.    Pt will benefit from skilled therapeutic intervention in order to improve on the following deficits Decreased mobility;Pain;Impaired UE functional use   Rehab Potential Excellent   Clinical Impairments Affecting Rehab Potential HEP compliance   PT Frequency 2x / week   PT Duration 6 weeks   PT Treatment/Interventions ADLs/Self Care Home Management;Moist Heat;Therapeutic activities;Therapeutic exercise;Patient/family education;Manual techniques   PT Next Visit Plan Stretching and PROM- reassess ROM. Progress HEP.    Consulted and Agree with Plan of Care Patient        Problem List Patient Active Problem List   Diagnosis Date Noted  . Rash and nonspecific skin eruption 08/26/2014  . Acute sinusitis 04/17/2014  . Exercise intolerance 08/07/2013  . Tremor 06/30/2013  . Mouth pain 03/01/2013  . Sebaceous cyst 02/15/2013  . Actinic keratosis of right cheek 02/15/2013  . Tingling 06/24/2012  . Tachycardia 01/30/2011  . Dizziness 01/30/2011  . OTHER&UNSPECIFIED DISEASES THE ORAL SOFT TISSUES 07/25/2008  . DERMATOFIBROMA, ARM 11/11/2007  . NEOPLASM, SKIN, UNCERTAIN BEHAVIOR 40/98/1191  . HYPERLIPIDEMIA 02/10/2007  . ANXIETY, SITUATIONAL 02/10/2007  . ALLERGIC  RHINITIS 02/10/2007  . GERD 02/10/2007  . IBS 02/10/2007  . POSTPARTUM DEPRESSION 02/10/2007  Dollene Cleveland, PT 08/27/2014, 5:33 PM

## 2014-08-28 ENCOUNTER — Ambulatory Visit: Payer: 59

## 2014-08-28 ENCOUNTER — Telehealth: Payer: Self-pay | Admitting: Family Medicine

## 2014-08-28 ENCOUNTER — Telehealth: Payer: Self-pay | Admitting: *Deleted

## 2014-08-28 DIAGNOSIS — R7303 Prediabetes: Secondary | ICD-10-CM | POA: Insufficient documentation

## 2014-08-28 DIAGNOSIS — M25512 Pain in left shoulder: Secondary | ICD-10-CM

## 2014-08-28 DIAGNOSIS — E099 Drug or chemical induced diabetes mellitus without complications: Secondary | ICD-10-CM | POA: Insufficient documentation

## 2014-08-28 DIAGNOSIS — Z5189 Encounter for other specified aftercare: Secondary | ICD-10-CM | POA: Diagnosis not present

## 2014-08-28 DIAGNOSIS — R000 Tachycardia, unspecified: Secondary | ICD-10-CM

## 2014-08-28 DIAGNOSIS — E785 Hyperlipidemia, unspecified: Secondary | ICD-10-CM

## 2014-08-28 DIAGNOSIS — R739 Hyperglycemia, unspecified: Secondary | ICD-10-CM

## 2014-08-28 NOTE — Telephone Encounter (Signed)
Please let pt know I put future orders in as requested by Dr Damita Dunnings Please schedule a fasting lab appt

## 2014-08-28 NOTE — Telephone Encounter (Signed)
appts made and printed...td 

## 2014-08-28 NOTE — Therapy (Addendum)
Physical Therapy Treatment  Patient Details  Name: Jamie Coleman MRN: [id number] Date of Birth: 13-Nov-1970  Encounter Date: 08/28/2014      PT End of Session - 08/28/14 1720    Visit Number 3   Number of Visits 12   Date for PT Re-Evaluation 09/20/14   PT Start Time 15   PT Stop Time 1730   PT Time Calculation (min) 60 min   Activity Tolerance Patient tolerated treatment well      Past Medical History  Diagnosis Date  . Allergy     allergic rhinitis  . GERD (gastroesophageal reflux disease)   . Hyperlipidemia   . Anxiety     situational  . Status post endometrial ablation     Past Surgical History  Procedure Laterality Date  . Cesarean section    . Foot surgery    . Tonsillectomy      as child  . Endometrial ablation      8/12  . Tubal ligation  8/12  . Laparoscopic tubal ligation  06/09/2011    Procedure: LAPAROSCOPIC TUBAL LIGATION;  Surgeon: Arloa Koh;  Location: [facility];  Service: Gynecology;  Laterality: N/A;    There were no vitals taken for this visit.  Visit Diagnosis:  Left shoulder pain      Subjective Assessment - 08/28/14 1634    Symptoms Pain rates 3/10 today after working full shift, 9 hours.    Limitations House hold activities   Currently in Pain? Yes   Pain Score 3    Pain Location Shoulder   Pain Orientation Left   Pain Descriptors / Indicators Aching;Sharp   Pain Type Acute pain   Pain Onset More than a month ago   Pain Frequency Intermittent   Aggravating Factors  reaching behind back.    Pain Relieving Factors repositioning   Effect of Pain on Daily Activities dressing          [facility] PT Assessment - 08/28/14 1716    PROM   Left Shoulder Flexion 137 Degrees   Left Shoulder ABduction 98 Degrees   Left Shoulder Internal Rotation 40 Degrees   Left Shoulder External Rotation 70 Degrees          [facility] Adult PT Treatment/Exercise - 08/28/14 1653    Shoulder Exercises: Sidelying   Other Sidelying Exercises Post capsule  stretch, 3 30 sec holds    Shoulder Exercises: Standing   Internal Rotation Left;Other (comment)   Internal Rotation Limitations FIR stretch 1 min hold, 3 sets   Flexion AAROM;Both   Flexion Limitations 10 reps, 10 sec hold, physio ball on table.     ABduction AAROM   ABduction Limitations 10 reps, 5 sec hold , physioball on table    Row Strengthening;10 reps;Theraband;Other (comment)   Theraband Level (Shoulder Row) Level 2 (Red)   Row Limitations 10 reps,  2 sets   Shoulder Exercises: Pulleys   Flexion 2 minutes   ABduction 2 minutes   Other Pulley Exercises ER, 2 Mins   Manual Therapy   Manual Therapy Joint mobilization;Massage   Joint Mobilization Post inf mobs   Myofascial Release STM to posterior SH joint, teres major, minor.           PT Education - 08/27/14 1721    Education provided Yes   Education Details Educated pt to perform HEP stretches for 3, 30 sec holds.    Person(s) Educated Patient   Methods Explanation;Demonstration;Tactile cues   Comprehension Verbalized understanding;Returned demonstration  PT Short Term Goals - 08/27/14 1727    PT SHORT TERM GOAL #1   Title all STGs=LTGs   Status On-going          PT Long Term Goals - 08/27/14 1727    PT LONG TERM GOAL #1   Title Demonstrate and/or verbalize techniques to reduce the risk of re-injury to include info on: posture, body mechanics, lifting.    Time 6   Period Weeks   Status On-going   PT LONG TERM GOAL #2   Title be indep with advanced HEP UE/ upper back strengthening   Time 6   Period Weeks   Status On-going   PT LONG TERM GOAL #3   Title increase ROM actively in L UE so that pt can don clothing eith minimal to no pain   Time 6   Period Weeks   Status On-going   PT LONG TERM GOAL #4   Title tolerate sitting for greater than 1 hour o document with 2/10 pain or less   Time 6   Status On-going   PT LONG TERM GOAL #5   Title Improve FOTO from 35% limitations to 26% or less    Time 6   Period Weeks   Status On-going   Additional Long Term Goals   Additional Long Term Goals Yes   PT LONG TERM GOAL #6   Title report pain decrease in 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    Time 6   Period Weeks   Status On-going          Plan - 08/28/14 1722    Clinical Impression Statement Reassessed PROM with ERP with flexion, abduction, and IR. Pt tolerated progressed IR stretch in sidelying. Continued limitation with ROM. Pt compliant with HEP.    Pt will benefit from skilled therapeutic intervention in order to improve on the following deficits Decreased mobility;Pain;Impaired UE functional use   Rehab Potential Excellent   Clinical Impairments Affecting Rehab Potential HEP compliance   PT Frequency 2x / week   PT Duration 6 weeks   PT Treatment/Interventions ADLs/Self Care Home Management;Moist Heat;Therapeutic activities;Therapeutic exercise;Patient/family education;Manual techniques   PT Next Visit Plan Stretching. Progress HEP.         Problem List Patient Active Problem List   Diagnosis Date Noted  . Rash and nonspecific skin eruption 08/26/2014  . Acute sinusitis 04/17/2014  . Exercise intolerance 08/07/2013  . Tremor 06/30/2013  . Mouth pain 03/01/2013  . Sebaceous cyst 02/15/2013  . Actinic keratosis of right cheek 02/15/2013  . Tingling 06/24/2012  . Tachycardia 01/30/2011  . Dizziness 01/30/2011  . OTHER&UNSPECIFIED DISEASES THE ORAL SOFT TISSUES 07/25/2008  . DERMATOFIBROMA, ARM 11/11/2007  . NEOPLASM, SKIN, UNCERTAIN BEHAVIOR 56/31/4970  . HYPERLIPIDEMIA 02/10/2007  . ANXIETY, SITUATIONAL 02/10/2007  . ALLERGIC RHINITIS 02/10/2007  . GERD 02/10/2007  . IBS 02/10/2007  . POSTPARTUM DEPRESSION 02/10/2007                                              Dollene Cleveland, PT 08/28/2014, 5:38 PM     Pt never returned - reason unknown - d/c PT.  Romualdo Bolk,  PT, DPT 08/16/2015 9:45 AM Phone: [phone] Fax: [phone]

## 2014-08-28 NOTE — Telephone Encounter (Signed)
-----   Message from Tonia Ghent, MD sent at 08/24/2014  3:35 PM EST ----- Patient was asking if you would put in lab orders.  She thought she was due. Thanks.

## 2014-08-29 NOTE — Telephone Encounter (Signed)
Left voicemail letting pt know she can call and schedule her fasting lab appt

## 2014-08-30 ENCOUNTER — Encounter: Payer: 59 | Admitting: Physical Therapy

## 2014-09-05 ENCOUNTER — Other Ambulatory Visit (INDEPENDENT_AMBULATORY_CARE_PROVIDER_SITE_OTHER): Payer: 59

## 2014-09-05 ENCOUNTER — Other Ambulatory Visit: Payer: Self-pay | Admitting: Family Medicine

## 2014-09-05 DIAGNOSIS — R739 Hyperglycemia, unspecified: Secondary | ICD-10-CM

## 2014-09-05 DIAGNOSIS — R000 Tachycardia, unspecified: Secondary | ICD-10-CM

## 2014-09-05 DIAGNOSIS — E785 Hyperlipidemia, unspecified: Secondary | ICD-10-CM

## 2014-09-05 LAB — LIPID PANEL
Cholesterol: 270 mg/dL — ABNORMAL HIGH (ref 0–200)
HDL: 65.3 mg/dL (ref >39.00)
LDL Cholesterol: 179 mg/dL — ABNORMAL HIGH (ref 0–99)
NonHDL: 204.7
Total CHOL/HDL Ratio: 4
Triglycerides: 128 mg/dL (ref 0.0–149.0)
VLDL: 25.6 mg/dL (ref 0.0–40.0)

## 2014-09-05 LAB — COMPREHENSIVE METABOLIC PANEL
ALT: 41 U/L — ABNORMAL HIGH (ref 0–35)
AST: 33 U/L (ref 0–37)
Albumin: 4.1 g/dL (ref 3.5–5.2)
Alkaline Phosphatase: 52 U/L (ref 39–117)
BUN: 16 mg/dL (ref 6–23)
CO2: 26 mEq/L (ref 19–32)
Calcium: 9.3 mg/dL (ref 8.4–10.5)
Chloride: 102 mEq/L (ref 96–112)
Creatinine, Ser: 0.9 mg/dL (ref 0.4–1.2)
GFR: 76.95 mL/min (ref >60.00)
Glucose, Bld: 96 mg/dL (ref 70–99)
Potassium: 4.4 mEq/L (ref 3.5–5.1)
Sodium: 137 mEq/L (ref 135–145)
Total Bilirubin: 0.6 mg/dL (ref 0.2–1.2)
Total Protein: 7 g/dL (ref 6.0–8.3)

## 2014-09-05 LAB — HEMOGLOBIN A1C: Hgb A1c MFr Bld: 5.9 % (ref 4.6–6.5)

## 2014-09-05 LAB — TSH: TSH: 2.32 u[IU]/mL (ref 0.35–4.50)

## 2014-09-12 ENCOUNTER — Encounter: Payer: 59 | Admitting: Physical Therapy

## 2014-09-19 ENCOUNTER — Encounter: Payer: Self-pay | Admitting: Family Medicine

## 2014-09-25 ENCOUNTER — Telehealth: Payer: Self-pay | Admitting: Family Medicine

## 2014-09-25 MED ORDER — ALPRAZOLAM 0.5 MG PO TABS: 0.5000 mg | ORAL_TABLET | Freq: Two times a day (BID) | ORAL | Status: DC | PRN

## 2014-09-25 NOTE — Telephone Encounter (Signed)
Pt needs refill of xanax (mychart note) Please call it in

## 2014-09-25 NOTE — Telephone Encounter (Signed)
Rx called in as prescribed 

## 2014-10-25 ENCOUNTER — Other Ambulatory Visit: Payer: Self-pay | Admitting: Obstetrics and Gynecology

## 2014-10-25 DIAGNOSIS — Z1231 Encounter for screening mammogram for malignant neoplasm of breast: Secondary | ICD-10-CM

## 2014-11-06 ENCOUNTER — Ambulatory Visit (HOSPITAL_COMMUNITY)
Admission: RE | Admit: 2014-11-06 | Discharge: 2014-11-06 | Disposition: A | Discharge status: Home / Self Care | Payer: 59 | Source: Ambulatory Visit | Attending: Obstetrics and Gynecology | Admitting: Obstetrics and Gynecology

## 2014-11-06 DIAGNOSIS — Z1231 Encounter for screening mammogram for malignant neoplasm of breast: Secondary | ICD-10-CM | POA: Diagnosis not present

## 2014-12-13 ENCOUNTER — Other Ambulatory Visit: Payer: Self-pay | Admitting: Family Medicine

## 2014-12-27 ENCOUNTER — Encounter: Payer: Self-pay | Admitting: Family Medicine

## 2014-12-27 ENCOUNTER — Telehealth: Payer: Self-pay | Admitting: Family Medicine

## 2014-12-27 MED ORDER — ALPRAZOLAM 0.5 MG PO TABS: 0.5000 mg | ORAL_TABLET | Freq: Two times a day (BID) | ORAL | Status: DC | PRN

## 2014-12-27 NOTE — Telephone Encounter (Signed)
Xanax refill request.  Last seen 08/24/2014.  Last filled 09/22/2014.

## 2014-12-27 NOTE — Telephone Encounter (Signed)
Please call in xanax thanks.

## 2014-12-27 NOTE — Telephone Encounter (Signed)
Xanax called into [facility].  Patient aware.

## 2014-12-28 ENCOUNTER — Ambulatory Visit (INDEPENDENT_AMBULATORY_CARE_PROVIDER_SITE_OTHER): Payer: 59 | Admitting: Family Medicine

## 2014-12-28 ENCOUNTER — Encounter: Payer: Self-pay | Admitting: Family Medicine

## 2014-12-28 VITALS — BP 126/74 | HR 91 | Temp 98.4°F | Wt 187.0 lb

## 2014-12-28 DIAGNOSIS — K649 Unspecified hemorrhoids: Secondary | ICD-10-CM

## 2014-12-28 MED ORDER — HYDROCORTISONE ACETATE 25 MG RE SUPP: 25.0000 mg | Freq: Two times a day (BID) | RECTAL | Status: DC

## 2014-12-28 NOTE — Patient Instructions (Signed)
For hemorrhiods- use anusol hc suppository once to twice per day  Avoid straining  Avoid hard bowel movements  If no improvement over the next week or if worse please let me know

## 2014-12-28 NOTE — Progress Notes (Signed)
Subjective:    Patient ID: Jamie Coleman, female    DOB: 12-10-1970, 44 y.o.   MRN: [id number]  HPI Here for hemorrhoid problem   Yesterday- had a lot of BRB from rectum  No rectal pain  It feels like there is pressure in her rectum (like she has to move bowels)   Last BM was yesterday - did not have to strain much  Am nl stool/ then loose /soft stool   No abd pain   Otherwise feeling ok   Has had ext hemorrhoids in the past  Tiny bit of bleeding in the past  No hx of colonoscopy   Patient Active Problem List   Diagnosis Date Noted  . Hyperglycemia 08/28/2014  . Rash and nonspecific skin eruption 08/26/2014  . Acute sinusitis 04/17/2014  . Exercise intolerance 08/07/2013  . Tremor 06/30/2013  . Mouth pain 03/01/2013  . Sebaceous cyst 02/15/2013  . Actinic keratosis of right cheek 02/15/2013  . Tingling 06/24/2012  . Tachycardia 01/30/2011  . Dizziness 01/30/2011  . OTHER&UNSPECIFIED DISEASES THE ORAL SOFT TISSUES 07/25/2008  . DERMATOFIBROMA, ARM 11/11/2007  . NEOPLASM, SKIN, UNCERTAIN BEHAVIOR 90/24/0973  . Hyperlipidemia 02/10/2007  . ANXIETY, SITUATIONAL 02/10/2007  . ALLERGIC RHINITIS 02/10/2007  . GERD 02/10/2007  . IBS 02/10/2007  . POSTPARTUM DEPRESSION 02/10/2007   Past Medical History  Diagnosis Date  . Allergy     allergic rhinitis  . GERD (gastroesophageal reflux disease)   . Hyperlipidemia   . Anxiety     situational  . Status post endometrial ablation    Past Surgical History  Procedure Laterality Date  . Cesarean section    . Foot surgery    . Tonsillectomy      as child  . Endometrial ablation      8/12  . Tubal ligation  8/12  . Laparoscopic tubal ligation  06/09/2011    Procedure: LAPAROSCOPIC TUBAL LIGATION;  Surgeon: Arloa Koh;  Location: [facility];  Service: Gynecology;  Laterality: N/A;   History  Substance Use Topics  . Smoking status: Former Smoker    Quit date: 07/25/1983  . Smokeless tobacco: Never Used  . Alcohol  Use: 6.0 oz/week    10 Standard drinks or equivalent per week     Comment: 7-10 drinks a week (alcohol, wine, or beer)/ daily   Family History  Problem Relation Age of Onset  . Cancer Mother     CA insitu of appendix  . Heart disease Father     CAD  . Diabetes Father     type II  . Diabetes Brother     type II  . Diabetes Maternal Grandfather   . Diabetes Paternal Grandmother   . Diabetes Paternal Grandfather    Allergies  Allergen Reactions  . Lipitor [Atorvastatin]     myalgias   Current Outpatient Prescriptions on File Prior to Visit  Medication Sig Dispense Refill  . ALPRAZolam (XANAX) 0.5 MG tablet Take 1 tablet (0.5 mg total) by mouth 2 (two) times daily as needed for anxiety. 15 tablet 1  . Calcium Carbonate-Vit D-Min 600-400 MG-UNIT TABS Take 1 tablet by mouth 2 (two) times daily.     . cetirizine (ZYRTEC) 10 MG tablet Take 10 mg by mouth daily as needed for allergies.     Marland Kitchen ibuprofen (ADVIL,MOTRIN) 200 MG tablet Take 800 mg by mouth every 6 (six) hours as needed. pain    . Multiple Vitamin (MULTIVITAMIN) capsule Take 1 capsule by mouth daily.      Marland Kitchen  Omega-3 Fatty Acids (FISH OIL PO) Take by mouth daily.      . pantoprazole (PROTONIX) 40 MG tablet TAKE 1 TABLET BY MOUTH ONCE DAILY AS NEEDED 30 tablet 5   No current facility-administered medications on file prior to visit.      Review of Systems Review of Systems  Constitutional: Negative for fever, appetite change, fatigue and unexpected weight change.  Eyes: Negative for pain and visual disturbance.  Respiratory: Negative for cough and shortness of breath.   Cardiovascular: Negative for cp or palpitations    Gastrointestinal: Negative for nausea, diarrhea and constipation. no abd pain , neg for black or dark stool  Genitourinary: Negative for urgency and frequency.  Skin: Negative for pallor or rash   Neurological: Negative for weakness, light-headedness, numbness and headaches.  Hematological: Negative for  adenopathy. Does not bruise/bleed easily.  Psychiatric/Behavioral: Negative for dysphoric mood. The patient is not nervous/anxious.         Objective:   Physical Exam  Constitutional: She appears well-developed and well-nourished. No distress.  obese and well appearing   HENT:  Head: Normocephalic and atraumatic.  Eyes: Conjunctivae and EOM are normal. Pupils are equal, round, and reactive to light. No scleral icterus.  Neck: Normal range of motion. Neck supple.  Cardiovascular: Normal rate and regular rhythm.   Pulmonary/Chest: Effort normal and breath sounds normal.  Abdominal: Soft. Bowel sounds are normal. She exhibits no distension and no mass. There is no tenderness. There is no rebound and no guarding.  Genitourinary: Rectal exam shows no external hemorrhoid, no fissure, no mass, no tenderness and anal tone normal. Guaiac negative stool.  Musculoskeletal: She exhibits no edema.  Neurological: She is alert.  Skin: Skin is warm and dry. No rash noted. No erythema. No pallor.  Psychiatric: She has a normal mood and affect.          Assessment & Plan:   Problem List Items Addressed This Visit      Cardiovascular and Mediastinum   Hemorrhoids - Primary    Internal with bleeding but no pain  Reassuring exam-heme neg today  Disc avoidance of straining and need to keep stools soft  Will tx with anusol hc suppositories Update if not starting to improve in a week or if worsening    Handout given

## 2014-12-28 NOTE — Progress Notes (Signed)
Pre visit review using our clinic review tool, if applicable. No additional management support is needed unless otherwise documented below in the visit note. 

## 2014-12-30 NOTE — Assessment & Plan Note (Signed)
Internal with bleeding but no pain  Reassuring exam-heme neg today  Disc avoidance of straining and need to keep stools soft  Will tx with anusol hc suppositories Update if not starting to improve in a week or if worsening    Handout given

## 2015-04-09 ENCOUNTER — Encounter: Payer: Self-pay | Admitting: Family Medicine

## 2015-04-09 ENCOUNTER — Ambulatory Visit (INDEPENDENT_AMBULATORY_CARE_PROVIDER_SITE_OTHER): Payer: 59 | Admitting: Family Medicine

## 2015-04-09 VITALS — BP 106/68 | HR 99 | Temp 98.8°F | Ht 62.5 in | Wt 190.5 lb

## 2015-04-09 DIAGNOSIS — M7710 Lateral epicondylitis, unspecified elbow: Secondary | ICD-10-CM | POA: Insufficient documentation

## 2015-04-09 DIAGNOSIS — M7711 Lateral epicondylitis, right elbow: Secondary | ICD-10-CM | POA: Diagnosis not present

## 2015-04-09 MED ORDER — ALPRAZOLAM 0.5 MG PO TABS: 0.5000 mg | ORAL_TABLET | Freq: Two times a day (BID) | ORAL | Status: DC | PRN

## 2015-04-09 MED ORDER — IBUPROFEN 800 MG PO TABS: 800.0000 mg | ORAL_TABLET | Freq: Three times a day (TID) | ORAL | Status: DC | PRN

## 2015-04-09 NOTE — Assessment & Plan Note (Signed)
R arm in R handed patient  Disc use of ice/ relative rest/ forearm band and ibuprofen with food 800 mg tid  Also given handout with exercises to do as directed  If no improvement- she will make an appt with Dr Lorelei Pont in 2 weeks - an injection may be helpful although she would rather avoid it

## 2015-04-09 NOTE — Patient Instructions (Signed)
Get a forearm band to wear on your Right arm just below the elbow Take it easy on that arm  Use ice whenever you can  If no improvement in 2 weeks please make an appt with Dr Lorelei Pont for further evaluation

## 2015-04-09 NOTE — Progress Notes (Signed)
Subjective:    Patient ID: Jamie Coleman, female    DOB: 10-21-1970, 44 y.o.   MRN: [id number]  HPI Here for c/o R arm pain   Started a few months ago  Below elbow with pain that radiates up and down  achey or sharp at times  Using it makes it worse - lifting/ unhooking bra More radial side   No noise  No swelling  No tennis or golf She is R handed - always has repedative activity  Has used heat and ice and stretching  Not really helpful  Ibuprofen 800 mg tid - helps the most  Has not used a brace or wrap   Patient Active Problem List   Diagnosis Date Noted  . Hemorrhoids 12/28/2014  . Hyperglycemia 08/28/2014  . Rash and nonspecific skin eruption 08/26/2014  . Acute sinusitis 04/17/2014  . Exercise intolerance 08/07/2013  . Tremor 06/30/2013  . Mouth pain 03/01/2013  . Sebaceous cyst 02/15/2013  . Actinic keratosis of right cheek 02/15/2013  . Tingling 06/24/2012  . Tachycardia 01/30/2011  . Dizziness 01/30/2011  . OTHER&UNSPECIFIED DISEASES THE ORAL SOFT TISSUES 07/25/2008  . DERMATOFIBROMA, ARM 11/11/2007  . NEOPLASM, SKIN, UNCERTAIN BEHAVIOR 19/62/2297  . Hyperlipidemia 02/10/2007  . ANXIETY, SITUATIONAL 02/10/2007  . ALLERGIC RHINITIS 02/10/2007  . GERD 02/10/2007  . IBS 02/10/2007  . POSTPARTUM DEPRESSION 02/10/2007   Past Medical History  Diagnosis Date  . Allergy     allergic rhinitis  . GERD (gastroesophageal reflux disease)   . Hyperlipidemia   . Anxiety     situational  . Status post endometrial ablation    Past Surgical History  Procedure Laterality Date  . Cesarean section    . Foot surgery    . Tonsillectomy      as child  . Endometrial ablation      8/12  . Tubal ligation  8/12  . Laparoscopic tubal ligation  06/09/2011    Procedure: LAPAROSCOPIC TUBAL LIGATION;  Surgeon: Arloa Koh;  Location: [facility];  Service: Gynecology;  Laterality: N/A;   History  Substance Use Topics  . Smoking status: Former Smoker    Quit date:  07/25/1983  . Smokeless tobacco: Never Used  . Alcohol Use: 6.0 oz/week    10 Standard drinks or equivalent per week     Comment: 7-10 drinks a week (alcohol, wine, or beer)/ daily   Family History  Problem Relation Age of Onset  . Cancer Mother     CA insitu of appendix  . Heart disease Father     CAD  . Diabetes Father     type II  . Diabetes Brother     type II  . Diabetes Maternal Grandfather   . Diabetes Paternal Grandmother   . Diabetes Paternal Grandfather    Allergies  Allergen Reactions  . Lipitor [Atorvastatin]     myalgias   Current Outpatient Prescriptions on File Prior to Visit  Medication Sig Dispense Refill  . ALPRAZolam (XANAX) 0.5 MG tablet Take 1 tablet (0.5 mg total) by mouth 2 (two) times daily as needed for anxiety. 15 tablet 1  . Calcium Carbonate-Vit D-Min 600-400 MG-UNIT TABS Take 1 tablet by mouth 2 (two) times daily.     . cetirizine (ZYRTEC) 10 MG tablet Take 10 mg by mouth daily as needed for allergies.     . hydrocortisone (ANUSOL-HC) 25 MG suppository Place 1 suppository (25 mg total) rectally 2 (two) times daily. 12 suppository 1  . ibuprofen (ADVIL,MOTRIN)  200 MG tablet Take 800 mg by mouth every 6 (six) hours as needed. pain    . Multiple Vitamin (MULTIVITAMIN) capsule Take 1 capsule by mouth daily.      . Omega-3 Fatty Acids (FISH OIL PO) Take by mouth daily.      . pantoprazole (PROTONIX) 40 MG tablet TAKE 1 TABLET BY MOUTH ONCE DAILY AS NEEDED 30 tablet 5   No current facility-administered medications on file prior to visit.      Review of Systems Review of Systems  Constitutional: Negative for fever, appetite change, fatigue and unexpected weight change.  Eyes: Negative for pain and visual disturbance.  Respiratory: Negative for cough and shortness of breath.   Cardiovascular: Negative for cp or palpitations    Gastrointestinal: Negative for nausea, diarrhea and constipation.  Genitourinary: Negative for urgency and frequency.  Skin:  Negative for pallor or rash   MSK pos for R elbow pain  Neurological: Negative for weakness, light-headedness, numbness and headaches.  Hematological: Negative for adenopathy. Does not bruise/bleed easily.  Psychiatric/Behavioral: Negative for dysphoric mood. The patient is not nervous/anxious.         Objective:   Physical Exam  Constitutional: She appears well-developed and well-nourished. No distress.  obese and well appearing   Eyes: Conjunctivae and EOM are normal. Pupils are equal, round, and reactive to light.  Neck: Normal range of motion. Neck supple.  Cardiovascular: Normal rate and regular rhythm.   Musculoskeletal: She exhibits tenderness. She exhibits no edema.       Right elbow: She exhibits decreased range of motion. She exhibits no swelling, no effusion and no deformity. Tenderness found. Lateral epicondyle tenderness noted. No olecranon process tenderness noted.  Tender lateral epicondyle R elbow Limited flex against resistance due to pain  Nl pronation/supination    Lymphadenopathy:    She has no cervical adenopathy.  Neurological: She is alert. She has normal strength and normal reflexes. She displays no atrophy. No sensory deficit. She exhibits normal muscle tone.  Skin: Skin is warm and dry. No rash noted. No erythema. No pallor.  Psychiatric: She has a normal mood and affect.          Assessment & Plan:   Problem List Items Addressed This Visit    Epicondylitis, lateral - Primary    R arm in R handed patient  Disc use of ice/ relative rest/ forearm band and ibuprofen with food 800 mg tid  Also given handout with exercises to do as directed  If no improvement- she will make an appt with Dr Lorelei Pont in 2 weeks - an injection may be helpful although she would rather avoid it      Relevant Medications   ibuprofen (ADVIL,MOTRIN) 800 MG tablet

## 2015-04-09 NOTE — Progress Notes (Signed)
Pre visit review using our clinic review tool, if applicable. No additional management support is needed unless otherwise documented below in the visit note. 

## 2015-06-05 ENCOUNTER — Ambulatory Visit (INDEPENDENT_AMBULATORY_CARE_PROVIDER_SITE_OTHER): Payer: 59 | Admitting: Family Medicine

## 2015-06-05 ENCOUNTER — Encounter: Payer: Self-pay | Admitting: Family Medicine

## 2015-06-05 VITALS — BP 120/76 | HR 72 | Temp 98.6°F | Ht 62.5 in | Wt 191.2 lb

## 2015-06-05 DIAGNOSIS — M7711 Lateral epicondylitis, right elbow: Secondary | ICD-10-CM | POA: Diagnosis not present

## 2015-06-05 MED ORDER — NITROGLYCERIN 0.2 MG/HR TD PT24: MEDICATED_PATCH | TRANSDERMAL | Status: DC

## 2015-06-05 NOTE — Patient Instructions (Addendum)
Nitroglycerin Protocol   Apply 1/4 nitroglycerin patch to affected area daily.  Change position of patch within the affected area every 24 hours.  You may experience a headache during the first 1-2 weeks of using the patch, these should subside.  If you experience headaches after beginning nitroglycerin patch treatment, you may take your preferred over the counter pain reliever.  Another side effect of the nitroglycerin patch is skin irritation or rash related to patch adhesive.  Please notify our office if you develop more severe headaches or rash, and stop the patch.  Tendon healing with nitroglycerin patch may require 12 to 24 weeks depending on the extent of injury.  Men should not use if taking Viagra, Cialis, or Levitra.   Do not use if you have migraines or rosacea.    REFERRALS TO SPECIALISTS, SPECIAL TESTS (MRI, CT, ULTRASOUNDS)  [facility] or [facility] will help you. ASK CHECK-IN FOR HELP.  Imaging / Special Testing referrals sometimes can be done same day if EMERGENCY, but others can take 2 or 3 days to get an appointment. Starting in 2015, many of the new Medicare plans and Obamacare plans take much longer.   Specialist appointment times vary a great deal, based on their schedule / openings. -- Some specialists have very long wait times. (Example. Dermatology. Multiple months  for non-cancer)

## 2015-06-05 NOTE — Progress Notes (Signed)
Pre visit review using our clinic review tool, if applicable. No additional management support is needed unless otherwise documented below in the visit note. 

## 2015-06-05 NOTE — Progress Notes (Signed)
Dr. Frederico Hamman T. Rondel Episcopo, MD, [facility] Medicine [place], [address] Phone: [phone] Fax: [phone]  06/05/2015  Patient: Jamie Coleman, MRN: [id number], DOB: [birth year], 44 y.o.  Primary Physician:  Loura Pardon, MD  Chief Complaint: Arm Pain  Chief Complaint  Patient presents with  . Arm Pain    Right    Subjective:   Jamie Coleman presents with lateral elbow pain.  Length of symptoms: approx 4 months Hand effected: R  Has been ongoing for months since the spring. Saw her in June - hospice and funeral, sudden death, early lung cancer.  GI nurse. Kathee Polite, Inpatient.   Patient describes a dull ache on the lateral elbow. There is some translation in the proximal forearm and in the distal upper arm. It is painful to lift with the hand facing down and to lift with the thumb in an upright position. Supination is painful. Patient points to the lateral epicondyle as the point of maximal tenderness near ECRB.  No trauma.   No prior fractures or operative interventions in the effective hand. Prior PT or HEP: some HEP  Denies numbness or tingling. No significant neck or shoulder pain.  Hand of dominance: RHD  The PMH, PSH, Social History, Family History, Medications, and allergies have been reviewed in [facility], and have been updated if relevant.  Patient Active Problem List   Diagnosis Date Noted  . Epicondylitis, lateral 04/09/2015  . Hemorrhoids 12/28/2014  . Hyperglycemia 08/28/2014  . Rash and nonspecific skin eruption 08/26/2014  . Acute sinusitis 04/17/2014  . Exercise intolerance 08/07/2013  . Tremor 06/30/2013  . Mouth pain 03/01/2013  . Sebaceous cyst 02/15/2013  . Actinic keratosis of right cheek 02/15/2013  . Tingling 06/24/2012  . Tachycardia 01/30/2011  . Dizziness 01/30/2011  . OTHER&UNSPECIFIED DISEASES THE ORAL SOFT TISSUES 07/25/2008  . DERMATOFIBROMA, ARM 11/11/2007  . NEOPLASM, SKIN,  UNCERTAIN BEHAVIOR 35/59/7416  . Hyperlipidemia 02/10/2007  . ANXIETY, SITUATIONAL 02/10/2007  . ALLERGIC RHINITIS 02/10/2007  . GERD 02/10/2007  . IBS 02/10/2007  . POSTPARTUM DEPRESSION 02/10/2007    Past Medical History  Diagnosis Date  . Allergy     allergic rhinitis  . GERD (gastroesophageal reflux disease)   . Hyperlipidemia   . Anxiety     situational  . Status post endometrial ablation     Past Surgical History  Procedure Laterality Date  . Cesarean section    . Foot surgery    . Tonsillectomy      as child  . Endometrial ablation      8/12  . Tubal ligation  8/12  . Laparoscopic tubal ligation  06/09/2011    Procedure: LAPAROSCOPIC TUBAL LIGATION;  Surgeon: Arloa Koh;  Location: [facility];  Service: Gynecology;  Laterality: N/A;    Social History   Social History  . Marital Status: Married    Spouse Name: N/A  . Number of Children: N/A  . Years of Education: N/A   Occupational History  . Not on file.   Social History Main Topics  . Smoking status: Former Smoker    Quit date: 07/25/1983  . Smokeless tobacco: Never Used  . Alcohol Use: 6.0 oz/week    10 Standard drinks or equivalent per week     Comment: 7-10 drinks a week (alcohol, wine, or beer)/ daily  . Drug Use: No  . Sexual Activity:    Partners: Male    Birth Control/ Protection: Surgical  Comment: Tubal ligation   Other Topics Concern  . Not on file   Social History Narrative    Family History  Problem Relation Age of Onset  . Cancer Mother     CA insitu of appendix  . Heart disease Father     CAD  . Diabetes Father     type II  . Diabetes Brother     type II  . Diabetes Maternal Grandfather   . Diabetes Paternal Grandmother   . Diabetes Paternal Grandfather     Allergies  Allergen Reactions  . Lipitor [Atorvastatin]     myalgias    Medication list reviewed and updated in full in [place].  GEN: No fevers, chills. Nontoxic. Primarily MSK c/o today. MSK:  Detailed in the HPI GI: tolerating PO intake without difficulty Neuro: No numbness, parasthesias, or tingling associated. Otherwise the pertinent positives of the ROS are noted above.   Objective:   Blood pressure 120/76, pulse 72, temperature 98.6 F (37 C), temperature source Oral, height 5' 2.5" (1.588 m), weight 191 lb 4 oz (86.75 kg).  GEN: Well-developed,well-nourished,in no acute distress; alert,appropriate and cooperative throughout examination HEENT: Normocephalic and atraumatic without obvious abnormalities. Ears, externally no deformities PULM: Breathing comfortably in no respiratory distress EXT: No clubbing, cyanosis, or edema PSYCH: Normally interactive. Cooperative during the interview. Pleasant. Friendly and conversant. Not anxious or depressed appearing. Normal, full affect.  R elbow Ecchymosis or edema: neg ROM: full flexion, extension, pronation, supination Shoulder ROM: Full Flexion: 5/5 Extension: 5/5, PAINFUL Supination: 5/5, PAINFUL Pronation: 5/5 Wrist ext: 5/5 Wrist flexion: 5/5 No gross bony abnormality Varus and Valgus stress: stable ECRB tenderness: YES, TTP Medial epicondyle: NT Lateral epicondyle, resisted wrist extension from wrist full pronation and flexion: PAINFUL grip: 5/5  sensation intact Tinel's, Elbow: negative  Subjective:   Right lateral epicondylitis - Plan: Ambulatory referral to Physical Therapy  Elbow anatomy was reviewed, and tendinopathy was explained.  Pt. given a formal rehab program from [facility] on elbow rehabiliation.  Series of eccentric exercises should be done starting with no weight, work up to 1 lb, hammer, etc. Also reviewed additional flexor and extensor grip rehab using dry rice or beans  Use counterforce strap if working or using hands.  Formal PT would be beneficial. Emphasized stretching an cross-friction massage Emphasized proper palms up lifting biomechanics to unload ECRB  I appreciate the opportunity  to evaluate this very friendly patient. If you have any question regarding her care or prognosis, do not hesitate to ask.   Follow-up: 2 mo  New Prescriptions   NITROGLYCERIN (NITRODUR - DOSED IN MG/24 HR) 0.2 MG/HR PATCH    Apply 1/4 of a patch to the affected area and change every 24 hours (re: tendinopathy)   Orders Placed This Encounter  Procedures  . Ambulatory referral to Physical Therapy    Signed,  Frederico Hamman T. Toyoko Silos, MD   Patient's Medications  New Prescriptions   NITROGLYCERIN (NITRODUR - DOSED IN MG/24 HR) 0.2 MG/HR PATCH    Apply 1/4 of a patch to the affected area and change every 24 hours (re: tendinopathy)  Previous Medications   ALPRAZOLAM (XANAX) 0.5 MG TABLET    Take 1 tablet (0.5 mg total) by mouth 2 (two) times daily as needed for anxiety.   CALCIUM CARBONATE-VIT D-MIN 600-400 MG-UNIT TABS    Take 1 tablet by mouth 2 (two) times daily.    CETIRIZINE (ZYRTEC) 10 MG TABLET    Take 10 mg by mouth daily  as needed for allergies.    HYDROCORTISONE (ANUSOL-HC) 25 MG SUPPOSITORY    Place 1 suppository (25 mg total) rectally 2 (two) times daily.   IBUPROFEN (ADVIL,MOTRIN) 800 MG TABLET    Take 1 tablet (800 mg total) by mouth every 8 (eight) hours as needed (with food).   MULTIPLE VITAMIN (MULTIVITAMIN) CAPSULE    Take 1 capsule by mouth daily.     OMEGA-3 FATTY ACIDS (FISH OIL PO)    Take by mouth daily.     PANTOPRAZOLE (PROTONIX) 40 MG TABLET    TAKE 1 TABLET BY MOUTH ONCE DAILY AS NEEDED  Modified Medications   No medications on file  Discontinued Medications   No medications on file

## 2015-06-18 ENCOUNTER — Other Ambulatory Visit: Payer: Self-pay | Admitting: Family Medicine

## 2015-07-17 ENCOUNTER — Ambulatory Visit: Payer: 59 | Admitting: Family Medicine

## 2015-07-24 ENCOUNTER — Encounter: Payer: Self-pay | Admitting: Family Medicine

## 2015-07-24 ENCOUNTER — Ambulatory Visit (INDEPENDENT_AMBULATORY_CARE_PROVIDER_SITE_OTHER): Payer: 59 | Admitting: Family Medicine

## 2015-07-24 VITALS — BP 110/72 | HR 74 | Temp 98.5°F | Ht 62.5 in | Wt 194.5 lb

## 2015-07-24 DIAGNOSIS — M7711 Lateral epicondylitis, right elbow: Secondary | ICD-10-CM | POA: Diagnosis not present

## 2015-07-24 DIAGNOSIS — M67921 Unspecified disorder of synovium and tendon, right upper arm: Secondary | ICD-10-CM | POA: Diagnosis not present

## 2015-07-24 NOTE — Progress Notes (Signed)
Pre visit review using our clinic review tool, if applicable. No additional management support is needed unless otherwise documented below in the visit note. 

## 2015-07-24 NOTE — Progress Notes (Signed)
Dr. Frederico Hamman T. Glendal Cassaday, MD, [facility] Medicine [place], [address] Phone: [phone] Fax: [phone]  07/24/2015  Patient: Jamie Coleman, MRN: [id number], DOB: [birth year], 44 y.o.  Primary Physician:  Loura Pardon, MD  Chief Complaint: Follow-up  Chief Complaint  Patient presents with  . Follow-up    Right Arm    Subjective:   Jamie Coleman presents with lateral elbow pain.  R arm is improving, since has been improving, improving.  Overall 50%.  She has been very compliant, going to physical therapy, and she is also doing some home rehabilitation.  She was really unable to tolerate the nitroglycerin patches, she got quite a bad headache.  06/05/2015 Last OV with Owens Loffler, MD  Length of symptoms: approx 4 months Hand effected: R  Has been ongoing for months since the spring. Saw her in June - hospice and funeral, sudden death, early lung cancer.  GI nurse. Kathee Polite, Inpatient.   Patient describes a dull ache on the lateral elbow. There is some translation in the proximal forearm and in the distal upper arm. It is painful to lift with the hand facing down and to lift with the thumb in an upright position. Supination is painful. Patient points to the lateral epicondyle as the point of maximal tenderness near ECRB.  No trauma.   No prior fractures or operative interventions in the effective hand. Prior PT or HEP: some HEP  Denies numbness or tingling. No significant neck or shoulder pain.  Hand of dominance: RHD  The PMH, PSH, Social History, Family History, Medications, and allergies have been reviewed in [facility], and have been updated if relevant.  Patient Active Problem List   Diagnosis Date Noted  . Epicondylitis, lateral 04/09/2015  . Hemorrhoids 12/28/2014  . Hyperglycemia 08/28/2014  . Rash and nonspecific skin eruption 08/26/2014  . Acute sinusitis 04/17/2014  . Exercise intolerance  08/07/2013  . Tremor 06/30/2013  . Mouth pain 03/01/2013  . Sebaceous cyst 02/15/2013  . Actinic keratosis of right cheek 02/15/2013  . Tingling 06/24/2012  . Tachycardia 01/30/2011  . Dizziness 01/30/2011  . OTHER&UNSPECIFIED DISEASES THE ORAL SOFT TISSUES 07/25/2008  . DERMATOFIBROMA, ARM 11/11/2007  . NEOPLASM, SKIN, UNCERTAIN BEHAVIOR 86/57/8469  . Hyperlipidemia 02/10/2007  . ANXIETY, SITUATIONAL 02/10/2007  . ALLERGIC RHINITIS 02/10/2007  . GERD 02/10/2007  . IBS 02/10/2007  . POSTPARTUM DEPRESSION 02/10/2007    Past Medical History  Diagnosis Date  . Allergy     allergic rhinitis  . GERD (gastroesophageal reflux disease)   . Hyperlipidemia   . Anxiety     situational  . Status post endometrial ablation     Past Surgical History  Procedure Laterality Date  . Cesarean section    . Foot surgery    . Tonsillectomy      as child  . Endometrial ablation      8/12  . Tubal ligation  8/12  . Laparoscopic tubal ligation  06/09/2011    Procedure: LAPAROSCOPIC TUBAL LIGATION;  Surgeon: Arloa Koh;  Location: [facility];  Service: Gynecology;  Laterality: N/A;    Social History   Social History  . Marital Status: Married    Spouse Name: N/A  . Number of Children: N/A  . Years of Education: N/A   Occupational History  . Not on file.   Social History Main Topics  . Smoking status: Former Smoker    Quit date: 07/25/1983  . Smokeless tobacco:  Never Used  . Alcohol Use: 6.0 oz/week    10 Standard drinks or equivalent per week     Comment: 7-10 drinks a week (alcohol, wine, or beer)/ daily  . Drug Use: No  . Sexual Activity:    Partners: Male    Birth Control/ Protection: Surgical     Comment: Tubal ligation   Other Topics Concern  . Not on file   Social History Narrative    Family History  Problem Relation Age of Onset  . Cancer Mother     CA insitu of appendix  . Heart disease Father     CAD  . Diabetes Father     type II  . Diabetes Brother      type II  . Diabetes Maternal Grandfather   . Diabetes Paternal Grandmother   . Diabetes Paternal Grandfather     Allergies  Allergen Reactions  . Lipitor [Atorvastatin]     myalgias    Medication list reviewed and updated in full in [place].  GEN: No fevers, chills. Nontoxic. Primarily MSK c/o today. MSK: Detailed in the HPI GI: tolerating PO intake without difficulty Neuro: No numbness, parasthesias, or tingling associated. Otherwise the pertinent positives of the ROS are noted above.   Objective:   Blood pressure 110/72, pulse 74, temperature 98.5 F (36.9 C), temperature source Oral, height 5' 2.5" (1.588 m), weight 194 lb 8 oz (88.225 kg).  GEN: Well-developed,well-nourished,in no acute distress; alert,appropriate and cooperative throughout examination HEENT: Normocephalic and atraumatic without obvious abnormalities. Ears, externally no deformities PULM: Breathing comfortably in no respiratory distress EXT: No clubbing, cyanosis, or edema PSYCH: Normally interactive. Cooperative during the interview. Pleasant. Friendly and conversant. Not anxious or depressed appearing. Normal, full affect.  R elbow Ecchymosis or edema: neg ROM: full flexion, extension, pronation, supination Shoulder ROM: Full Flexion: 5/5 Extension: 5/5, mild pain Supination: 5/5, mild pain Pronation: 5/5 Wrist ext: 5/5 Wrist flexion: 5/5 No gross bony abnormality Varus and Valgus stress: stable ECRB tenderness: YES, TTP Medial epicondyle: NT Lateral epicondyle, resisted wrist extension from wrist full pronation and flexion: mild pain grip: 5/5  ttp at distal biceps insertion  sensation intact Tinel's, Elbow: negative  Subjective:   Right lateral epicondylitis  Biceps tendinopathy, right  Doing well, cont conservative care with f/u in 2 mo  Follow-up: Return in about 2 months (around 09/23/2015).  Signed,  Maud Deed. Kamdin Follett, MD   Patient's Medications  New  Prescriptions   No medications on file  Previous Medications   ALPRAZOLAM (XANAX) 0.5 MG TABLET    Take 1 tablet (0.5 mg total) by mouth 2 (two) times daily as needed for anxiety.   CALCIUM CARBONATE-VIT D-MIN 600-400 MG-UNIT TABS    Take 1 tablet by mouth 2 (two) times daily.    CETIRIZINE (ZYRTEC) 10 MG TABLET    Take 10 mg by mouth daily as needed for allergies.    HYDROCORTISONE (ANUSOL-HC) 25 MG SUPPOSITORY    Place 1 suppository (25 mg total) rectally 2 (two) times daily.   IBUPROFEN (ADVIL,MOTRIN) 800 MG TABLET    Take 1 tablet (800 mg total) by mouth every 8 (eight) hours as needed (with food).   MULTIPLE VITAMIN (MULTIVITAMIN) CAPSULE    Take 1 capsule by mouth daily.     NITROGLYCERIN (NITRODUR - DOSED IN MG/24 HR) 0.2 MG/HR PATCH    Apply 1/4 of a patch to the affected area and change every 24 hours (re: tendinopathy)   OMEGA-3 FATTY ACIDS (FISH  OIL PO)    Take by mouth daily.     PANTOPRAZOLE (PROTONIX) 40 MG TABLET    TAKE 1 TABLET BY MOUTH ONCE DAILY AS NEEDED  Modified Medications   No medications on file  Discontinued Medications   No medications on file

## 2015-08-01 ENCOUNTER — Telehealth: Payer: Self-pay | Admitting: Obstetrics and Gynecology

## 2015-08-01 NOTE — Telephone Encounter (Signed)
Left Message To Call Back to reschedule appointment for 08/02/2015 with Dr.Silva.

## 2015-08-02 ENCOUNTER — Ambulatory Visit: Payer: 59 | Admitting: Obstetrics and Gynecology

## 2015-08-07 ENCOUNTER — Ambulatory Visit: Payer: 59 | Admitting: Obstetrics and Gynecology

## 2015-08-08 ENCOUNTER — Encounter: Payer: Self-pay | Admitting: Obstetrics and Gynecology

## 2015-08-08 ENCOUNTER — Ambulatory Visit (INDEPENDENT_AMBULATORY_CARE_PROVIDER_SITE_OTHER): Payer: 59 | Admitting: Obstetrics and Gynecology

## 2015-08-08 VITALS — BP 122/74 | HR 80 | Resp 14 | Ht 61.75 in | Wt 196.8 lb

## 2015-08-08 DIAGNOSIS — Z01419 Encounter for gynecological examination (general) (routine) without abnormal findings: Secondary | ICD-10-CM | POA: Diagnosis not present

## 2015-08-08 DIAGNOSIS — R7989 Other specified abnormal findings of blood chemistry: Secondary | ICD-10-CM

## 2015-08-08 DIAGNOSIS — R7309 Other abnormal glucose: Secondary | ICD-10-CM

## 2015-08-08 HISTORY — DX: Other abnormal glucose: R73.09

## 2015-08-08 HISTORY — DX: Other specified abnormal findings of blood chemistry: R79.89

## 2015-08-08 NOTE — Progress Notes (Signed)
Patient ID: Jamie Coleman, female   DOB: 09-May-1971, 44 y.o.   MRN: [id number] 44 y.o. [id number] Married Caucasian female here for annual exam.    Status post endometrial ablation and BTL in 2012.  Cycles are spotting only.  Has more mood changes. Notes bilateral breast tenderness when takes her bra off.   Uses NTG for tendinopathy.   Dad now has Alzheimer's. Seems to have family history of paternal grandmother with Alzheimer's.  PCP: Loura Pardon, MD    Patient's last menstrual period was 08/05/2015.      Sexually active: Yes.    The current method of family planning is tubal ligation.    Exercising: Yes.    aerobics Smoker:  Former smoker years ago in [place] Maintenance: Pap:  07-27-14 Neg:Neg HR HPV History of abnormal Pap:  Yes, colposcopy in 2011. MMG:  11-06-14 Density Cat.D/Neg/BiRads1:[facility]. Colonoscopy:  none BMD:   none  Result  none TDaP:  UTD, within the last 5 years Screening Labs:  Hb today: will come back for fasting labs,     reports that she quit smoking about 32 years ago. She has never used smokeless tobacco. She reports that she drinks about 6.0 oz of alcohol per week. She reports that she does not use illicit drugs.  Past Medical History  Diagnosis Date  . Allergy     allergic rhinitis  . GERD (gastroesophageal reflux disease)   . Hyperlipidemia   . Anxiety     situational  . Status post endometrial ablation   . Tennis elbow     currently in PT-10/16    Past Surgical History  Procedure Laterality Date  . Cesarean section    . Foot surgery    . Tonsillectomy      as child  . Endometrial ablation      8/12  . Tubal ligation  8/12  . Laparoscopic tubal ligation  06/09/2011    Procedure: LAPAROSCOPIC TUBAL LIGATION;  Surgeon: Arloa Koh;  Location: [facility];  Service: Gynecology;  Laterality: N/A;    Current Outpatient Prescriptions  Medication Sig Dispense Refill  . ALPRAZolam (XANAX) 0.5 MG tablet Take 1 tablet (0.5  mg total) by mouth 2 (two) times daily as needed for anxiety. 15 tablet 1  . Calcium Carbonate-Vit D-Min 600-400 MG-UNIT TABS Take 1 tablet by mouth 2 (two) times daily.     . cetirizine (ZYRTEC) 10 MG tablet Take 10 mg by mouth daily as needed for allergies.     . hydrocortisone (ANUSOL-HC) 25 MG suppository Place 1 suppository (25 mg total) rectally 2 (two) times daily. 12 suppository 1  . ibuprofen (ADVIL,MOTRIN) 800 MG tablet Take 1 tablet (800 mg total) by mouth every 8 (eight) hours as needed (with food). 90 tablet 2  . Multiple Vitamin (MULTIVITAMIN) capsule Take 1 capsule by mouth daily.      . Omega-3 Fatty Acids (FISH OIL PO) Take by mouth daily.      . pantoprazole (PROTONIX) 40 MG tablet TAKE 1 TABLET BY MOUTH ONCE DAILY AS NEEDED 30 tablet 3  . nitroGLYCERIN (NITRODUR - DOSED IN MG/24 HR) 0.2 mg/hr patch Apply 1/4 of a patch to the affected area and change every 24 hours (re: tendinopathy) (Patient not taking: Reported on 08/08/2015) 30 patch 11   No current facility-administered medications for this visit.    Family History  Problem Relation Age of Onset  . Cancer Mother     CA insitu of appendix  .  Heart disease Father     CAD  . Diabetes Father     type II  . Diabetes Brother     type II  . Diabetes Maternal Grandfather   . Diabetes Paternal Grandmother   . Diabetes Paternal Grandfather     ROS:  Pertinent items are noted in HPI.  Otherwise, a comprehensive ROS was negative.  Exam:   BP 122/74 mmHg  Pulse 80  Resp 14  Ht 5' 1.75" (1.568 m)  Wt 196 lb 12.8 oz (89.268 kg)  BMI 36.31 kg/m2  LMP 08/05/2015    General appearance: alert, cooperative and appears stated age Head: Normocephalic, without obvious abnormality, atraumatic Neck: no adenopathy, supple, symmetrical, trachea midline and thyroid normal to inspection and palpation Lungs: clear to auscultation bilaterally Breasts: normal appearance, no masses or tenderness, Inspection negative, No nipple  retraction or dimpling, No nipple discharge or bleeding, No axillary or supraclavicular adenopathy Heart: regular rate and rhythm Abdomen: soft, non-tender; bowel sounds normal; no masses,  no organomegaly Extremities: extremities normal, atraumatic, no cyanosis or edema Skin: Skin color, texture, turgor normal. No rashes or lesions Lymph nodes: Cervical, supraclavicular, and axillary nodes normal. No abnormal inguinal nodes palpated Neurologic: Grossly normal  Pelvic: External genitalia:  no lesions              Urethra:  normal appearing urethra with no masses, tenderness or lesions              Bartholins and Skenes: normal                 Vagina: normal appearing vagina with normal color and discharge, no lesions              Cervix: no lesions              Pap taken: No. Bimanual Exam:  Uterus:  normal size, contour, position, consistency, mobility, non-tender              Adnexa: normal adnexa and no mass, fullness, tenderness              Rectovaginal: Yes.  .  Confirms.              Anus:  normal sphincter tone, no lesions  Chaperone was present for exam.  Assessment:   Well woman visit with normal exam. Status post BTL and endometrial ablation.   Breast tenderness bilaterally.  FH of diabetes.   Plan: Yearly mammogram recommended after age 72.  Recommended self breast exam.  Pap and HR HPV as above. Discussed Calcium, Vitamin D, regular exercise program including cardiovascular and weight bearing exercise. Labs performed.  No..   See orders.  Will return for fasting labs.  Refills given on medications.  No..    Discussed SSRIs for PMS symptoms if desired.  Will not do at this time. Discussed NSAIDS and vit E for breast tenderness. Follow up annually and prn.      After visit summary provided.

## 2015-08-08 NOTE — Patient Instructions (Signed)

## 2015-08-14 ENCOUNTER — Other Ambulatory Visit (INDEPENDENT_AMBULATORY_CARE_PROVIDER_SITE_OTHER): Payer: 59

## 2015-08-14 DIAGNOSIS — Z01419 Encounter for gynecological examination (general) (routine) without abnormal findings: Secondary | ICD-10-CM

## 2015-08-14 LAB — COMPREHENSIVE METABOLIC PANEL
ALT: 43 U/L — ABNORMAL HIGH (ref 6–29)
AST: 38 U/L — ABNORMAL HIGH (ref 10–35)
Albumin: 4 g/dL (ref 3.6–5.1)
Alkaline Phosphatase: 58 U/L (ref 33–115)
BUN: 14 mg/dL (ref 7–25)
CO2: 25 mmol/L (ref 20–31)
Calcium: 9 mg/dL (ref 8.6–10.2)
Chloride: 102 mmol/L (ref 98–110)
Creat: 0.76 mg/dL (ref 0.50–1.10)
Glucose, Bld: 87 mg/dL (ref 65–99)
Potassium: 4.6 mmol/L (ref 3.5–5.3)
Sodium: 139 mmol/L (ref 135–146)
Total Bilirubin: 0.5 mg/dL (ref 0.2–1.2)
Total Protein: 6.4 g/dL (ref 6.1–8.1)

## 2015-08-14 LAB — LIPID PANEL
Cholesterol: 241 mg/dL — ABNORMAL HIGH (ref 125–200)
HDL: 58 mg/dL (ref >=46)
LDL Cholesterol: 128 mg/dL (ref <130)
Total CHOL/HDL Ratio: 4.2 Ratio (ref <=5.0)
Triglycerides: 273 mg/dL — ABNORMAL HIGH (ref <150)
VLDL: 55 mg/dL — ABNORMAL HIGH (ref <30)

## 2015-08-14 LAB — CBC
HCT: 40.4 % (ref 36.0–46.0)
Hemoglobin: 13.3 g/dL (ref 12.0–15.0)
MCH: 31.7 pg (ref 26.0–34.0)
MCHC: 32.9 g/dL (ref 30.0–36.0)
MCV: 96.4 fL (ref 78.0–100.0)
MPV: 9 fL (ref 8.6–12.4)
Platelets: 282 10*3/uL (ref 150–400)
RBC: 4.19 MIL/uL (ref 3.87–5.11)
RDW: 13.4 % (ref 11.5–15.5)
WBC: 5.6 10*3/uL (ref 4.0–10.5)

## 2015-08-14 LAB — TSH: TSH: 2.864 u[IU]/mL (ref 0.350–4.500)

## 2015-08-14 LAB — HEMOGLOBIN A1C
Hgb A1c MFr Bld: 5.8 % — ABNORMAL HIGH (ref <5.7)
Mean Plasma Glucose: 120 mg/dL — ABNORMAL HIGH (ref <117)

## 2015-08-15 ENCOUNTER — Encounter: Payer: Self-pay | Admitting: Obstetrics and Gynecology

## 2015-08-15 LAB — VITAMIN D 25 HYDROXY (VIT D DEFICIENCY, FRACTURES): Vit D, 25-Hydroxy: 25 ng/mL — ABNORMAL LOW (ref 30–100)

## 2015-09-20 ENCOUNTER — Encounter: Payer: Self-pay | Admitting: Family Medicine

## 2015-09-20 ENCOUNTER — Other Ambulatory Visit: Payer: Self-pay

## 2015-09-20 DIAGNOSIS — Z1231 Encounter for screening mammogram for malignant neoplasm of breast: Secondary | ICD-10-CM

## 2015-09-24 ENCOUNTER — Telehealth: Payer: Self-pay | Admitting: Family Medicine

## 2015-09-24 MED ORDER — ALPRAZOLAM 0.5 MG PO TABS: 0.5000 mg | ORAL_TABLET | Freq: Two times a day (BID) | ORAL | Status: DC | PRN

## 2015-09-24 NOTE — Telephone Encounter (Signed)
Px written for call in   Thanks  

## 2015-09-24 NOTE — Telephone Encounter (Signed)
Rx called in as prescribed 

## 2015-09-25 ENCOUNTER — Encounter: Payer: Self-pay | Admitting: Family Medicine

## 2015-09-25 ENCOUNTER — Ambulatory Visit (INDEPENDENT_AMBULATORY_CARE_PROVIDER_SITE_OTHER): Payer: 59 | Admitting: Family Medicine

## 2015-09-25 VITALS — BP 100/66 | HR 72 | Temp 98.6°F | Ht 62.5 in | Wt 196.0 lb

## 2015-09-25 DIAGNOSIS — M67921 Unspecified disorder of synovium and tendon, right upper arm: Secondary | ICD-10-CM | POA: Diagnosis not present

## 2015-09-25 DIAGNOSIS — M7711 Lateral epicondylitis, right elbow: Secondary | ICD-10-CM

## 2015-09-25 NOTE — Progress Notes (Signed)
Dr. Frederico Hamman T. Taylen Wendland, MD, [facility] [place], [address] Phone: [phone] Fax: [phone]  09/25/2015  Patient: Jamie Coleman, MRN: [id number], DOB: 18-Mar-1971, 44 y.o.  Primary Physician:  Loura Pardon, MD  Chief Complaint: Follow-up  Chief Complaint  Patient presents with  . Follow-up    Right Arm    Subjective:   Jamie Coleman presents with lateral elbow pain.  F/u R LE and biceps  Better, 90% Compliant with HEP and PT Occ pain at [facility]  07/24/2015 Last OV with Owens Loffler, MD  R arm is improving, since has been improving, improving.  Overall 50%.  She has been very compliant, going to physical therapy, and she is also doing some home rehabilitation.  She was really unable to tolerate the nitroglycerin patches, she got quite a bad headache.  06/05/2015 Last OV with Owens Loffler, MD  Length of symptoms: approx 4 months Hand effected: R  Has been ongoing for months since the spring. Saw her in June - hospice and funeral, sudden death, early lung cancer.  GI nurse. Jamie Coleman, Inpatient.   Patient describes a dull ache on the lateral elbow. There is some translation in the proximal forearm and in the distal upper arm. It is painful to lift with the hand facing down and to lift with the thumb in an upright position. Supination is painful. Patient points to the lateral epicondyle as the point of maximal tenderness near [facility].  No trauma.   No prior fractures or operative interventions in the effective hand. Prior PT or HEP: some HEP  Denies numbness or tingling. No significant neck or shoulder pain.  Hand of dominance: RHD  The PMH, PSH, Social History, Family History, Medications, and allergies have been reviewed in [facility], and have been updated if relevant.  Patient Active Problem List   Diagnosis Date Noted  . Epicondylitis, lateral 04/09/2015  . Hemorrhoids 12/28/2014  .  Hyperglycemia 08/28/2014  . Rash and nonspecific skin eruption 08/26/2014  . Acute sinusitis 04/17/2014  . Exercise intolerance 08/07/2013  . Tremor 06/30/2013  . Mouth pain 03/01/2013  . Sebaceous cyst 02/15/2013  . Actinic keratosis of right cheek 02/15/2013  . Tingling 06/24/2012  . Tachycardia 01/30/2011  . Dizziness 01/30/2011  . OTHER&UNSPECIFIED DISEASES THE ORAL SOFT TISSUES 07/25/2008  . DERMATOFIBROMA, ARM 11/11/2007  . NEOPLASM, SKIN, UNCERTAIN BEHAVIOR [id number]  . Hyperlipidemia 02/10/2007  . ANXIETY, SITUATIONAL 02/10/2007  . ALLERGIC RHINITIS 02/10/2007  . GERD 02/10/2007  . IBS 02/10/2007  . POSTPARTUM DEPRESSION 02/10/2007    Past Medical History  Diagnosis Date  . Allergy     allergic rhinitis  . GERD (gastroesophageal reflux disease)   . Hyperlipidemia   . Anxiety     situational  . Status post endometrial ablation   . Tennis elbow     currently in PT-10/16  . Elevated liver function tests 08/08/15  . Low serum vitamin D 08/08/15  . Elevated hemoglobin A1c 08/08/15    Past Surgical History  Procedure Laterality Date  . Cesarean section    . Foot surgery    . Tonsillectomy      as child  . Endometrial ablation      8/12  . Tubal ligation  8/12  . Laparoscopic tubal ligation  06/09/2011    Procedure: LAPAROSCOPIC TUBAL LIGATION;  Surgeon: Arloa Koh;  Location: [facility];  Service: Gynecology;  Laterality: N/A;    Social History  Social History  . Marital Status: Married    Spouse Name: N/A  . Number of Children: N/A  . Years of Education: N/A   Occupational History  . Not on file.   Social History Main Topics  . Smoking status: Former Smoker    Quit date: 07/25/1983  . Smokeless tobacco: Never Used     Comment: in high school  . Alcohol Use: 6.0 oz/week    10 Standard drinks or equivalent per week     Comment: 7-10 drinks a week (alcohol, wine, or beer)/ daily  . Drug Use: No  . Sexual Activity:    Partners: Male    Birth  Control/ Protection: Surgical     Comment: Tubal ligation   Other Topics Concern  . Not on file   Social History Narrative    Family History  Problem Relation Age of Onset  . Cancer Mother     CA insitu of appendix  . Heart disease Father     CAD  . Diabetes Father     type II  . Diabetes Brother     type II  . Diabetes Maternal Grandfather   . Diabetes Paternal Grandmother   . Diabetes Paternal Grandfather     Allergies  Allergen Reactions  . Lipitor [Atorvastatin]     myalgias    Medication list reviewed and updated in full in [facility].  GEN: No fevers, chills. Nontoxic. Primarily MSK c/o today. MSK: Detailed in the HPI GI: tolerating PO intake without difficulty Neuro: No numbness, parasthesias, or tingling associated. Otherwise the pertinent positives of the ROS are noted above.   Objective:   Blood pressure 100/66, pulse 72, temperature 98.6 F (37 C), temperature source Oral, height 5' 2.5" (1.588 m), weight 196 lb (88.905 kg).  GEN: Well-developed,well-nourished,in no acute distress; alert,appropriate and cooperative throughout examination HEENT: Normocephalic and atraumatic without obvious abnormalities. Ears, externally no deformities PULM: Breathing comfortably in no respiratory distress EXT: No clubbing, cyanosis, or edema PSYCH: Normally interactive. Cooperative during the interview. Pleasant. Friendly and conversant. Not anxious or depressed appearing. Normal, full affect.  R elbow Ecchymosis or edema: neg ROM: full flexion, extension, pronation, supination Shoulder ROM: Full Flexion: 5/5 Extension: 5/5, minimal pain Supination: 5/5, minimal pain Pronation: 5/5 Wrist ext: 5/5 Wrist flexion: 5/5 No gross bony abnormality Varus and Valgus stress: stable ECRB tenderness: YES, minimal TTP Medial epicondyle: NT Lateral epicondyle, resisted wrist extension from wrist full pronation and flexion: nt grip: 5/5  nt at distal biceps  insertion  sensation intact Tinel's, Elbow: negative  Subjective:   Right lateral epicondylitis  Biceps tendinopathy, right  Doing much better  Reviewed basic grip training for when better  Follow-up: prn  Signed,  Yareliz Thorstenson T. Caliegh Middlekauff, MD   Patient's Medications  New Prescriptions   No medications on file  Previous Medications   ALPRAZOLAM (XANAX) 0.5 MG TABLET    Take 1 tablet (0.5 mg total) by mouth 2 (two) times daily as needed for anxiety.   CALCIUM CARBONATE-VIT D-MIN 600-400 MG-UNIT TABS    Take 1 tablet by mouth 2 (two) times daily.    CETIRIZINE (ZYRTEC) 10 MG TABLET    Take 10 mg by mouth daily as needed for allergies.    HYDROCORTISONE (ANUSOL-HC) 25 MG SUPPOSITORY    Place 1 suppository (25 mg total) rectally 2 (two) times daily.   IBUPROFEN (ADVIL,MOTRIN) 800 MG TABLET    Take 1 tablet (800 mg total) by mouth every 8 (eight) hours as  needed (with food).   MULTIPLE VITAMIN (MULTIVITAMIN) CAPSULE    Take 1 capsule by mouth daily.     NITROGLYCERIN (NITRODUR - DOSED IN MG/24 HR) 0.2 MG/HR PATCH    Apply 1/4 of a patch to the affected area and change every 24 hours (re: tendinopathy)   OMEGA-3 FATTY ACIDS (FISH OIL PO)    Take by mouth daily.     PANTOPRAZOLE (PROTONIX) 40 MG TABLET    TAKE 1 TABLET BY MOUTH ONCE DAILY AS NEEDED  Modified Medications   No medications on file  Discontinued Medications   No medications on file

## 2015-09-25 NOTE — Progress Notes (Signed)
Pre visit review using our clinic review tool, if applicable. No additional management support is needed unless otherwise documented below in the visit note. 

## 2015-10-15 DIAGNOSIS — M7711 Lateral epicondylitis, right elbow: Secondary | ICD-10-CM | POA: Diagnosis not present

## 2015-11-06 DIAGNOSIS — M7711 Lateral epicondylitis, right elbow: Secondary | ICD-10-CM | POA: Diagnosis not present

## 2015-11-08 ENCOUNTER — Ambulatory Visit
Admission: RE | Admit: 2015-11-08 | Discharge: 2015-11-08 | Disposition: A | Discharge status: Home / Self Care | Payer: 59 | Source: Ambulatory Visit

## 2015-11-08 DIAGNOSIS — Z1231 Encounter for screening mammogram for malignant neoplasm of breast: Secondary | ICD-10-CM

## 2015-11-08 MED FILL — ALPRAZolam 0.5 MG TABS: 0.5 | 7 days supply | Qty: 15 | Fill #1

## 2015-11-11 ENCOUNTER — Other Ambulatory Visit: Payer: Self-pay | Admitting: Obstetrics and Gynecology

## 2015-11-11 DIAGNOSIS — R928 Other abnormal and inconclusive findings on diagnostic imaging of breast: Secondary | ICD-10-CM

## 2015-11-14 ENCOUNTER — Ambulatory Visit
Admission: RE | Admit: 2015-11-14 | Discharge: 2015-11-14 | Disposition: A | Discharge status: Home / Self Care | Payer: 59 | Source: Ambulatory Visit | Attending: Obstetrics and Gynecology | Admitting: Obstetrics and Gynecology

## 2015-11-14 DIAGNOSIS — R928 Other abnormal and inconclusive findings on diagnostic imaging of breast: Secondary | ICD-10-CM

## 2015-11-14 DIAGNOSIS — R921 Mammographic calcification found on diagnostic imaging of breast: Secondary | ICD-10-CM | POA: Diagnosis not present

## 2015-11-25 ENCOUNTER — Other Ambulatory Visit: Payer: Self-pay | Admitting: *Deleted

## 2015-11-25 MED ORDER — PANTOPRAZOLE SODIUM 40 MG PO TBEC: 40.0000 mg | DELAYED_RELEASE_TABLET | Freq: Every day | ORAL | Status: DC | PRN

## 2015-11-25 MED FILL — PANTOPRAZOLE SOD DR 40 MG T: 40 | 90 days supply | Qty: 90 | Fill #0

## 2015-11-25 NOTE — Telephone Encounter (Signed)
Fax refill request, no recent f/u or CPE and no future appt., please advise

## 2015-11-25 NOTE — Telephone Encounter (Signed)
done

## 2015-11-25 NOTE — Telephone Encounter (Signed)
Please refill for a year  

## 2016-01-11 ENCOUNTER — Encounter: Payer: Self-pay | Admitting: Family Medicine

## 2016-01-12 ENCOUNTER — Telehealth: Payer: Self-pay | Admitting: Family Medicine

## 2016-01-12 MED ORDER — ALPRAZOLAM 0.5 MG PO TABS: 0.5000 mg | ORAL_TABLET | Freq: Two times a day (BID) | ORAL | Status: DC | PRN

## 2016-01-12 NOTE — Telephone Encounter (Signed)
Please call in her xanax 

## 2016-01-13 MED FILL — ALPRAZolam 0.5 MG TABS: 0.5 | 15 days supply | Qty: 30 | Fill #0

## 2016-01-13 NOTE — Telephone Encounter (Signed)
Rx called in as prescribed 

## 2016-02-04 ENCOUNTER — Ambulatory Visit: Payer: 59 | Admitting: Family Medicine

## 2016-02-05 ENCOUNTER — Ambulatory Visit: Payer: Self-pay | Admitting: Family Medicine

## 2016-02-06 ENCOUNTER — Ambulatory Visit (INDEPENDENT_AMBULATORY_CARE_PROVIDER_SITE_OTHER)
Admission: RE | Admit: 2016-02-06 | Discharge: 2016-02-06 | Disposition: A | Discharge status: Home / Self Care | Payer: 59 | Source: Ambulatory Visit | Attending: Family Medicine | Admitting: Family Medicine

## 2016-02-06 ENCOUNTER — Ambulatory Visit (INDEPENDENT_AMBULATORY_CARE_PROVIDER_SITE_OTHER): Payer: 59 | Admitting: Family Medicine

## 2016-02-06 ENCOUNTER — Encounter: Payer: Self-pay | Admitting: Family Medicine

## 2016-02-06 VITALS — BP 110/68 | HR 81 | Temp 98.6°F | Ht 64.5 in | Wt 186.5 lb

## 2016-02-06 DIAGNOSIS — S6992XA Unspecified injury of left wrist, hand and finger(s), initial encounter: Secondary | ICD-10-CM

## 2016-02-06 NOTE — Progress Notes (Signed)
Pre visit review using our clinic review tool, if applicable. No additional management support is needed unless otherwise documented below in the visit note. 

## 2016-02-07 NOTE — Progress Notes (Signed)
Dr. Frederico Hamman T. Cleo Santucci, MD, [facility] Medicine [place], [address] Phone: [phone] Fax: [phone]  02/06/2016  Patient: Jamie Coleman, MRN: [id number], DOB: 18-Oct-1970, 45 y.o.  Primary Physician:  Loura Pardon, MD   Chief Complaint  Patient presents with  . Wrist Pain    Left-Got caught between a dolley and dishwasher about 2 1/2 weeks ago   Subjective:   Jamie Coleman is a 45 y.o. very pleasant female patient who presents with the following:  2 and half weeks ago, the patient was moving some furniture and got her left wrist caught between a dolly and a dishwasher.  Since then it is had quite a bit of pain.  She initially did have some bruising and she has had some intermittent swelling and effusion in the true wrist.  Right now, at baseline and doesn't really hurt, but with certain movements including supination  And when trying to hold the tray up in the air she is unable to do it secondary to pain.  Past Medical History, Surgical History, Social History, Family History, Problem List, Medications, and Allergies have been reviewed and updated if relevant.  Patient Active Problem List   Diagnosis Date Noted  . Epicondylitis, lateral 04/09/2015  . Hemorrhoids 12/28/2014  . Hyperglycemia 08/28/2014  . Rash and nonspecific skin eruption 08/26/2014  . Acute sinusitis 04/17/2014  . Exercise intolerance 08/07/2013  . Tremor 06/30/2013  . Mouth pain 03/01/2013  . Sebaceous cyst 02/15/2013  . Actinic keratosis of right cheek 02/15/2013  . Tingling 06/24/2012  . Tachycardia 01/30/2011  . Dizziness 01/30/2011  . OTHER&UNSPECIFIED DISEASES THE ORAL SOFT TISSUES 07/25/2008  . DERMATOFIBROMA, ARM 11/11/2007  . NEOPLASM, SKIN, UNCERTAIN BEHAVIOR 99991111  . Hyperlipidemia 02/10/2007  . ANXIETY, SITUATIONAL 02/10/2007  . ALLERGIC RHINITIS 02/10/2007  . GERD 02/10/2007  . IBS 02/10/2007  . POSTPARTUM DEPRESSION  02/10/2007    Past Medical History  Diagnosis Date  . Allergy     allergic rhinitis  . GERD (gastroesophageal reflux disease)   . Hyperlipidemia   . Anxiety     situational  . Status post endometrial ablation   . Tennis elbow     currently in PT-10/16  . Elevated liver function tests 08/08/15  . Low serum vitamin D 08/08/15  . Elevated hemoglobin A1c 08/08/15    Past Surgical History  Procedure Laterality Date  . Cesarean section    . Foot surgery    . Tonsillectomy      as child  . Endometrial ablation      8/12  . Tubal ligation  8/12  . Laparoscopic tubal ligation  06/09/2011    Procedure: LAPAROSCOPIC TUBAL LIGATION;  Surgeon: Arloa Koh;  Location: [facility];  Service: Gynecology;  Laterality: N/A;    Social History   Social History  . Marital Status: Married    Spouse Name: N/A  . Number of Children: N/A  . Years of Education: N/A   Occupational History  . Not on file.   Social History Main Topics  . Smoking status: Former Smoker    Quit date: 07/25/1983  . Smokeless tobacco: Never Used     Comment: in high school  . Alcohol Use: 6.0 oz/week    10 Standard drinks or equivalent per week     Comment: 7-10 drinks a week (alcohol, wine, or beer)/ daily  . Drug Use: No  . Sexual Activity:    Partners: Male  Birth Control/ Protection: Surgical     Comment: Tubal ligation   Other Topics Concern  . Not on file   Social History Narrative    Family History  Problem Relation Age of Onset  . Cancer Mother     CA insitu of appendix  . Heart disease Father     CAD  . Diabetes Father     type II  . Diabetes Brother     type II  . Diabetes Maternal Grandfather   . Diabetes Paternal Grandmother   . Diabetes Paternal Grandfather     Allergies  Allergen Reactions  . Lipitor [Atorvastatin]     myalgias    Medication list reviewed and updated in full in [facility].  GEN: No fevers, chills. Nontoxic. Primarily MSK c/o today. MSK: Detailed  in the HPI GI: tolerating PO intake without difficulty Neuro: No numbness, parasthesias, or tingling associated. Otherwise the pertinent positives of the ROS are noted above.   Objective:   BP 110/68 mmHg  Pulse 81  Temp(Src) 98.6 F (37 C) (Oral)  Ht 5' 4.5" (1.638 m)  Wt 186 lb 8 oz (84.596 kg)  BMI 31.53 kg/m2   GEN: WDWN, NAD, Non-toxic, Alert & Oriented x 3 HEENT: Atraumatic, Normocephalic.  Ears and Nose: No external deformity. EXTR: No clubbing/cyanosis/edema NEURO: Normal gait.  PSYCH: Normally interactive. Conversant. Not depressed or anxious appearing.  Calm demeanor.   Hand: L Ecchymosis or edema: neg ROM wrist/hand/digits/elbow: full  Carpals, MCP's, digits: NT Distal Ulna and Radius: NT Supination lift test: pos Ecchymosis or edema: neg Cysts/nodules: neg Finkelstein's test: neg Snuffbox tenderness: neg Scaphoid tubercle: NT Hook of Hamate: NT Resisted supination: NT Full composite fist Grip, all digits: 5/5 str No tenosynovitis Axial load test: pain Phalen's: neg Tinel's: neg Atrophy: neg  Hand sensation: intact   Radiology: Dg Wrist Complete Left  02/06/2016  CLINICAL DATA:  Left wrist injury.  Initial evaluation. EXAM: LEFT WRIST - COMPLETE 3+ VIEW COMPARISON:  No recent prior. FINDINGS: No acute bony or joint abnormality identified. No evidence of fracture dislocation. Normal mineralization. IMPRESSION: No acute abnormality. Electronically Signed   By: Marcello Moores  Register   On: 02/06/2016 16:12    Assessment and Plan:   Wrist injury, left, initial encounter - Plan: DG Wrist Complete Left  Suspect ligamentous disruption in the carpal ligaments.  No apparent fracture. On radiograph, the scapholunate space appears to be somewhat widened on my interpretation.  This could correlate with clinical symptoms, and the patient may have a scapholunate dissociation.  We discussed options, and for now we're going to place her in a cockup wrist splint,  continue anti-inflammatories and rest for the next few weeks.  Reassess in the next office visit.  Follow-up: Return in about 3 weeks (around 02/27/2016).  Orders Placed This Encounter  Procedures  . DG Wrist Complete Left    Signed,  Kamilo Och T. Keirah Konitzer, MD   Patient's Medications  New Prescriptions   No medications on file  Previous Medications   ALPRAZOLAM (XANAX) 0.5 MG TABLET    Take 1 tablet (0.5 mg total) by mouth 2 (two) times daily as needed for anxiety.   CALCIUM CARBONATE-VIT D-MIN 600-400 MG-UNIT TABS    Take 1 tablet by mouth 2 (two) times daily.    CETIRIZINE (ZYRTEC) 10 MG TABLET    Take 10 mg by mouth daily as needed for allergies.    HYDROCORTISONE (ANUSOL-HC) 25 MG SUPPOSITORY    Place 1 suppository (25 mg total)  rectally 2 (two) times daily.   IBUPROFEN (ADVIL,MOTRIN) 800 MG TABLET    Take 1 tablet (800 mg total) by mouth every 8 (eight) hours as needed (with food).   MULTIPLE VITAMIN (MULTIVITAMIN) CAPSULE    Take 1 capsule by mouth daily.     OMEGA-3 FATTY ACIDS (FISH OIL PO)    Take by mouth daily.     PANTOPRAZOLE (PROTONIX) 40 MG TABLET    Take 1 tablet (40 mg total) by mouth daily as needed.  Modified Medications   No medications on file  Discontinued Medications   NITROGLYCERIN (NITRODUR - DOSED IN MG/24 HR) 0.2 MG/HR PATCH    Apply 1/4 of a patch to the affected area and change every 24 hours (re: tendinopathy)

## 2016-02-24 ENCOUNTER — Encounter: Payer: Self-pay | Admitting: Family Medicine

## 2016-02-24 ENCOUNTER — Ambulatory Visit (INDEPENDENT_AMBULATORY_CARE_PROVIDER_SITE_OTHER): Payer: 59 | Admitting: Family Medicine

## 2016-02-24 VITALS — BP 106/70 | HR 64 | Temp 98.2°F | Ht 64.5 in | Wt 185.2 lb

## 2016-02-24 DIAGNOSIS — M25532 Pain in left wrist: Secondary | ICD-10-CM

## 2016-02-24 NOTE — Patient Instructions (Signed)

## 2016-02-24 NOTE — Progress Notes (Signed)
Pre visit review using our clinic review tool, if applicable. No additional management support is needed unless otherwise documented below in the visit note. 

## 2016-02-24 NOTE — Addendum Note (Signed)
Addended by: Owens Loffler on: 02/24/2016 10:22 AM   Modules accepted: Orders

## 2016-02-24 NOTE — Progress Notes (Signed)
Dr. Frederico Hamman T. Joanann Mies, MD, [facility] and Sports Medicine [place], [address] Phone: [phone] Fax: [phone]  02/24/2016  Patient: Jamie Coleman, MRN: [id number], DOB: [birth year], 45 y.o.  Primary Physician:  Loura Pardon, MD   Chief Complaint  Patient presents with  . Follow-up    Left Wrist   Subjective:   Cerria Ronchetti is a 45 y.o. very pleasant female patient who presents with the following:  F/u L wrist: 5 weeks out from injury moving furniture. S/p 2 weeks of immobilzation on cock-up wrist splint and NSAIDS. No better at all and still with severe pain with terminal extension and flexion at the wrist. Mild dorsal swelling.   Past Medical History, Surgical History, Social History, Family History, Problem List, Medications, and Allergies have been reviewed and updated if relevant.  Patient Active Problem List   Diagnosis Date Noted  . Epicondylitis, lateral 04/09/2015  . Hemorrhoids 12/28/2014  . Hyperglycemia 08/28/2014  . Rash and nonspecific skin eruption 08/26/2014  . Acute sinusitis 04/17/2014  . Exercise intolerance 08/07/2013  . Tremor 06/30/2013  . Mouth pain 03/01/2013  . Sebaceous cyst 02/15/2013  . Actinic keratosis of right cheek 02/15/2013  . Tingling 06/24/2012  . Tachycardia 01/30/2011  . Dizziness 01/30/2011  . OTHER&UNSPECIFIED DISEASES THE ORAL SOFT TISSUES 07/25/2008  . DERMATOFIBROMA, ARM 11/11/2007  . NEOPLASM, SKIN, UNCERTAIN BEHAVIOR [id number]  . Hyperlipidemia 02/10/2007  . ANXIETY, SITUATIONAL 02/10/2007  . ALLERGIC RHINITIS 02/10/2007  . GERD 02/10/2007  . IBS 02/10/2007  . POSTPARTUM DEPRESSION 02/10/2007    Past Medical History  Diagnosis Date  . Allergy     allergic rhinitis  . GERD (gastroesophageal reflux disease)   . Hyperlipidemia   . Anxiety     situational  . Status post endometrial ablation   . Tennis elbow     currently in PT-10/16  . Elevated liver function tests  08/08/15  . Low serum vitamin D 08/08/15  . Elevated hemoglobin A1c 08/08/15    Past Surgical History  Procedure Laterality Date  . Cesarean section    . Foot surgery    . Tonsillectomy      as child  . Endometrial ablation      8/12  . Tubal ligation  8/12  . Laparoscopic tubal ligation  06/09/2011    Procedure: LAPAROSCOPIC TUBAL LIGATION;  Surgeon: Arloa Koh;  Location: [facility];  Service: Gynecology;  Laterality: N/A;    Social History   Social History  . Marital Status: Married    Spouse Name: N/A  . Number of Children: N/A  . Years of Education: N/A   Occupational History  . Not on file.   Social History Main Topics  . Smoking status: Former Smoker    Quit date: 07/25/1983  . Smokeless tobacco: Never Used     Comment: in high school  . Alcohol Use: 6.0 oz/week    10 Standard drinks or equivalent per week     Comment: 7-10 drinks a week (alcohol, wine, or beer)/ daily  . Drug Use: No  . Sexual Activity:    Partners: Male    Birth Control/ Protection: Surgical     Comment: Tubal ligation   Other Topics Concern  . Not on file   Social History Narrative    Family History  Problem Relation Age of Onset  . Cancer Mother     CA insitu of appendix  . Heart disease Father  CAD  . Diabetes Father     type II  . Diabetes Brother     type II  . Diabetes Maternal Grandfather   . Diabetes Paternal Grandmother   . Diabetes Paternal Grandfather     Allergies  Allergen Reactions  . Lipitor [Atorvastatin]     myalgias    Medication list reviewed and updated in full in [facility].  GEN: No fevers, chills. Nontoxic. Primarily MSK c/o today. MSK: Detailed in the HPI GI: tolerating PO intake without difficulty Neuro: No numbness, parasthesias, or tingling associated. Otherwise the pertinent positives of the ROS are noted above.   Objective:   BP 106/70 mmHg  Pulse 64  Temp(Src) 98.2 F (36.8 C) (Oral)  Ht 5' 4.5" (1.638 m)  Wt 185 lb 4 oz  (84.029 kg)  BMI 31.32 kg/m2   GEN: WDWN, NAD, Non-toxic, Alert & Oriented x 3 HEENT: Atraumatic, Normocephalic.  Ears and Nose: No external deformity. EXTR: No clubbing/cyanosis/edema NEURO: Normal gait.  PSYCH: Normally interactive. Conversant. Not depressed or anxious appearing.  Calm demeanor.   Hand: L Ecchymosis or edema: neg ROM wrist/hand/digits/elbow: full - fullness in the true wrist Carpals, MCP's, digits: TTP in the carpal region. Distal Ulna and Radius: NT Supination lift test: neg Ecchymosis or edema: neg Cysts/nodules: neg Finkelstein's test: neg Snuffbox tenderness: neg Scaphoid tubercle: NT Hook of Hamate: NT Resisted supination: NT Full composite fist Grip, all digits: 5/5 str No tenosynovitis Axial load test: POS Phalen's: neg Tinel's: neg Atrophy: neg  Hand sensation: intact   Radiology: Dg Wrist Complete Left  02/06/2016  CLINICAL DATA:  Left wrist injury.  Initial evaluation. EXAM: LEFT WRIST - COMPLETE 3+ VIEW COMPARISON:  No recent prior. FINDINGS: No acute bony or joint abnormality identified. No evidence of fracture dislocation. Normal mineralization. IMPRESSION: No acute abnormality. Electronically Signed   By: Marcello Moores  Register   On: 02/06/2016 16:12    Assessment and Plan:   Left wrist pain - Plan: MR Wrist Left W Wo Contrast, DG FLUORO GUIDED NEEDLE PLC ASPIRATION/INJECTION LOC  Failure to improve with conservative management. On my interpretation of x-rays, possible scapholunate dissociation. Obtain an MR Arthrogram of the LEFT wrist to evaluate for occult fracture or ligamentous disruption in the carpal region.   Dispo depending on MR arthrogram.  Follow-up: No Follow-up on file.  Orders Placed This Encounter  Procedures  . MR Wrist Left W Wo Contrast  . DG FLUORO GUIDED NEEDLE PLC ASPIRATION/INJECTION LOC    Signed,  Heran Campau T. Ibtisam Benge, MD   Patient's Medications  New Prescriptions   No medications on file  Previous  Medications   ALPRAZOLAM (XANAX) 0.5 MG TABLET    Take 1 tablet (0.5 mg total) by mouth 2 (two) times daily as needed for anxiety.   CALCIUM CARBONATE-VIT D-MIN 600-400 MG-UNIT TABS    Take 1 tablet by mouth 2 (two) times daily.    CETIRIZINE (ZYRTEC) 10 MG TABLET    Take 10 mg by mouth daily as needed for allergies.    HYDROCORTISONE (ANUSOL-HC) 25 MG SUPPOSITORY    Place 1 suppository (25 mg total) rectally 2 (two) times daily.   IBUPROFEN (ADVIL,MOTRIN) 800 MG TABLET    Take 1 tablet (800 mg total) by mouth every 8 (eight) hours as needed (with food).   MULTIPLE VITAMIN (MULTIVITAMIN) CAPSULE    Take 1 capsule by mouth daily.     OMEGA-3 FATTY ACIDS (FISH OIL PO)    Take by mouth daily.  PANTOPRAZOLE (PROTONIX) 40 MG TABLET    Take 1 tablet (40 mg total) by mouth daily as needed.  Modified Medications   No medications on file  Discontinued Medications   No medications on file

## 2016-03-02 ENCOUNTER — Other Ambulatory Visit: Payer: Self-pay | Admitting: Family Medicine

## 2016-03-02 ENCOUNTER — Ambulatory Visit (HOSPITAL_COMMUNITY)
Admission: RE | Admit: 2016-03-02 | Discharge: 2016-03-02 | Disposition: A | Discharge status: Home / Self Care | Payer: 59 | Source: Ambulatory Visit | Attending: Family Medicine | Admitting: Family Medicine

## 2016-03-02 ENCOUNTER — Ambulatory Visit (HOSPITAL_COMMUNITY): Payer: 59

## 2016-03-02 ENCOUNTER — Other Ambulatory Visit (HOSPITAL_COMMUNITY): Payer: 59

## 2016-03-02 DIAGNOSIS — M25332 Other instability, left wrist: Secondary | ICD-10-CM | POA: Diagnosis not present

## 2016-03-02 DIAGNOSIS — S63592A Other specified sprain of left wrist, initial encounter: Secondary | ICD-10-CM | POA: Diagnosis not present

## 2016-03-02 DIAGNOSIS — X58XXXA Exposure to other specified factors, initial encounter: Secondary | ICD-10-CM | POA: Insufficient documentation

## 2016-03-02 DIAGNOSIS — M25532 Pain in left wrist: Secondary | ICD-10-CM

## 2016-03-02 MED ORDER — GADOBENATE DIMEGLUMINE 529 MG/ML IV SOLN
5.0000 mL | Freq: Once | INTRAVENOUS | Status: AC | PRN
  Administered 2016-03-02: 5 mL via INTRAVENOUS

## 2016-03-05 DIAGNOSIS — M25332 Other instability, left wrist: Secondary | ICD-10-CM | POA: Diagnosis not present

## 2016-03-18 ENCOUNTER — Other Ambulatory Visit: Payer: Self-pay | Admitting: Orthopedic Surgery

## 2016-03-24 MED FILL — PANTOPRAZOLE SOD DR 40 MG T: 40 | 90 days supply | Qty: 90 | Fill #1

## 2016-04-07 DIAGNOSIS — H16309 Unspecified interstitial keratitis, unspecified eye: Secondary | ICD-10-CM | POA: Diagnosis not present

## 2016-04-15 DIAGNOSIS — H5203 Hypermetropia, bilateral: Secondary | ICD-10-CM | POA: Diagnosis not present

## 2016-04-23 ENCOUNTER — Other Ambulatory Visit: Payer: Self-pay | Admitting: Obstetrics and Gynecology

## 2016-04-23 DIAGNOSIS — N63 Unspecified lump in unspecified breast: Secondary | ICD-10-CM

## 2016-04-23 DIAGNOSIS — R921 Mammographic calcification found on diagnostic imaging of breast: Secondary | ICD-10-CM

## 2016-04-24 ENCOUNTER — Encounter (HOSPITAL_BASED_OUTPATIENT_CLINIC_OR_DEPARTMENT_OTHER): Payer: Self-pay | Admitting: *Deleted

## 2016-05-07 ENCOUNTER — Other Ambulatory Visit: Payer: Self-pay | Admitting: Family Medicine

## 2016-05-07 MED ORDER — ALPRAZOLAM 0.5 MG PO TABS: 0.5000 mg | ORAL_TABLET | Freq: Two times a day (BID) | ORAL | 3 refills | Status: DC | PRN

## 2016-05-07 MED FILL — ALPRAZolam 0.5 MG TABS: 0.5 | 15 days supply | Qty: 30 | Fill #0

## 2016-05-07 NOTE — Telephone Encounter (Signed)
Px written for call in   

## 2016-05-07 NOTE — Telephone Encounter (Signed)
Pt has had a few acute appts but no recent f/u or CPE, last filled on 01/12/16 #30 with 0 refills, please advise

## 2016-05-07 NOTE — Telephone Encounter (Signed)
Rx called in as prescribed 

## 2016-05-08 ENCOUNTER — Encounter (HOSPITAL_BASED_OUTPATIENT_CLINIC_OR_DEPARTMENT_OTHER)
Admission: RE | Disposition: A | Discharge status: Home / Self Care | Payer: Self-pay | Source: Ambulatory Visit | Attending: Orthopedic Surgery

## 2016-05-08 ENCOUNTER — Ambulatory Visit (HOSPITAL_BASED_OUTPATIENT_CLINIC_OR_DEPARTMENT_OTHER): Payer: 59 | Admitting: Anesthesiology

## 2016-05-08 ENCOUNTER — Ambulatory Visit (HOSPITAL_BASED_OUTPATIENT_CLINIC_OR_DEPARTMENT_OTHER)
Admission: RE | Admit: 2016-05-08 | Discharge: 2016-05-09 | Disposition: A | Discharge status: Home / Self Care | Payer: 59 | Source: Ambulatory Visit | Attending: Orthopedic Surgery | Admitting: Orthopedic Surgery

## 2016-05-08 ENCOUNTER — Encounter (HOSPITAL_BASED_OUTPATIENT_CLINIC_OR_DEPARTMENT_OTHER): Payer: Self-pay | Admitting: *Deleted

## 2016-05-08 DIAGNOSIS — Z87891 Personal history of nicotine dependence: Secondary | ICD-10-CM | POA: Insufficient documentation

## 2016-05-08 DIAGNOSIS — S63302A Traumatic rupture of unspecified ligament of left wrist, initial encounter: Secondary | ICD-10-CM | POA: Diagnosis not present

## 2016-05-08 DIAGNOSIS — F419 Anxiety disorder, unspecified: Secondary | ICD-10-CM | POA: Diagnosis not present

## 2016-05-08 DIAGNOSIS — M25332 Other instability, left wrist: Secondary | ICD-10-CM | POA: Insufficient documentation

## 2016-05-08 DIAGNOSIS — S63592A Other specified sprain of left wrist, initial encounter: Secondary | ICD-10-CM | POA: Insufficient documentation

## 2016-05-08 DIAGNOSIS — S63392A Traumatic rupture of other ligament of left wrist, initial encounter: Secondary | ICD-10-CM | POA: Diagnosis not present

## 2016-05-08 DIAGNOSIS — K219 Gastro-esophageal reflux disease without esophagitis: Secondary | ICD-10-CM | POA: Insufficient documentation

## 2016-05-08 DIAGNOSIS — M25532 Pain in left wrist: Secondary | ICD-10-CM | POA: Diagnosis not present

## 2016-05-08 DIAGNOSIS — X58XXXA Exposure to other specified factors, initial encounter: Secondary | ICD-10-CM | POA: Insufficient documentation

## 2016-05-08 DIAGNOSIS — E785 Hyperlipidemia, unspecified: Secondary | ICD-10-CM | POA: Diagnosis not present

## 2016-05-08 DIAGNOSIS — G8918 Other acute postprocedural pain: Secondary | ICD-10-CM | POA: Diagnosis not present

## 2016-05-08 HISTORY — DX: Failed or difficult intubation, initial encounter: T88.4XXA

## 2016-05-08 HISTORY — PX: LIGAMENT REPAIR: SHX5444

## 2016-05-08 SURGERY — REPAIR, LIGAMENT
Anesthesia: General | Site: Wrist | Laterality: Left

## 2016-05-08 MED ORDER — GLYCOPYRROLATE 0.2 MG/ML IV SOSY
PREFILLED_SYRINGE | INTRAVENOUS | Status: AC
Start: 2016-05-08 — End: 2016-05-08
  Filled 2016-05-08: qty 3

## 2016-05-08 MED ORDER — CEFAZOLIN SODIUM-DEXTROSE 2-4 GM/100ML-% IV SOLN
2.0000 g | INTRAVENOUS | Status: AC
  Administered 2016-05-08: 2 g via INTRAVENOUS

## 2016-05-08 MED ORDER — METHOCARBAMOL 500 MG PO TABS: 500.0000 mg | ORAL_TABLET | Freq: Four times a day (QID) | ORAL | 0 refills | Status: DC

## 2016-05-08 MED ORDER — CEFAZOLIN SODIUM-DEXTROSE 2-4 GM/100ML-% IV SOLN
INTRAVENOUS | Status: AC
  Filled 2016-05-08: qty 100

## 2016-05-08 MED ORDER — MORPHINE SULFATE (PF) 2 MG/ML IV SOLN: 1.0000 mg | INTRAVENOUS | Status: DC | PRN

## 2016-05-08 MED ORDER — HYDROMORPHONE HCL 1 MG/ML IJ SOLN: 0.2500 mg | INTRAMUSCULAR | Status: DC | PRN

## 2016-05-08 MED ORDER — PROMETHAZINE HCL 25 MG/ML IJ SOLN
6.2500 mg | INTRAMUSCULAR | Status: DC | PRN
Start: 2016-05-08 — End: 2016-05-08

## 2016-05-08 MED ORDER — LIDOCAINE HCL (CARDIAC) 20 MG/ML IV SOLN
INTRAVENOUS | Status: DC | PRN
  Administered 2016-05-08: 50 mg via INTRAVENOUS

## 2016-05-08 MED ORDER — LORAZEPAM 1 MG PO TABS
1.0000 mg | ORAL_TABLET | ORAL | Status: DC | PRN
Start: 2016-05-08 — End: 2016-05-09
  Administered 2016-05-08: 1 mg via ORAL
  Filled 2016-05-08: qty 1

## 2016-05-08 MED ORDER — LACTATED RINGERS IV SOLN: INTRAVENOUS | Status: DC

## 2016-05-08 MED ORDER — SCOPOLAMINE 1 MG/3DAYS TD PT72: 1.0000 | MEDICATED_PATCH | Freq: Once | TRANSDERMAL | Status: DC | PRN

## 2016-05-08 MED ORDER — PROPOFOL 10 MG/ML IV BOLUS
INTRAVENOUS | Status: DC | PRN
Start: 2016-05-08 — End: 2016-05-08
  Administered 2016-05-08: 200 mg via INTRAVENOUS
  Administered 2016-05-08: 60 mg via INTRAVENOUS

## 2016-05-08 MED ORDER — OXYCODONE HCL 5 MG PO TABS
5.0000 mg | ORAL_TABLET | ORAL | Status: DC | PRN
  Administered 2016-05-08 – 2016-05-09 (×5): 5 mg via ORAL
  Administered 2016-05-09: 10 mg via ORAL
  Filled 2016-05-08: qty 2
  Filled 2016-05-08: qty 1
  Filled 2016-05-08: qty 2
  Filled 2016-05-08 (×3): qty 1

## 2016-05-08 MED ORDER — FENTANYL CITRATE (PF) 100 MCG/2ML IJ SOLN
50.0000 ug | INTRAMUSCULAR | Status: DC | PRN
  Administered 2016-05-08: 100 ug via INTRAVENOUS

## 2016-05-08 MED ORDER — ALPRAZOLAM 0.5 MG PO TABS: 0.5000 mg | ORAL_TABLET | Freq: Two times a day (BID) | ORAL | Status: DC | PRN

## 2016-05-08 MED ORDER — GLYCOPYRROLATE 0.2 MG/ML IJ SOLN: 0.2000 mg | Freq: Once | INTRAMUSCULAR | Status: DC | PRN

## 2016-05-08 MED ORDER — FENTANYL CITRATE (PF) 100 MCG/2ML IJ SOLN
INTRAMUSCULAR | Status: AC
  Filled 2016-05-08: qty 2

## 2016-05-08 MED ORDER — METHOCARBAMOL 1000 MG/10ML IJ SOLN: 500.0000 mg | Freq: Four times a day (QID) | INTRAVENOUS | Status: DC | PRN

## 2016-05-08 MED ORDER — ONDANSETRON HCL 4 MG/2ML IJ SOLN
INTRAMUSCULAR | Status: DC | PRN
  Administered 2016-05-08: 4 mg via INTRAVENOUS

## 2016-05-08 MED ORDER — LIDOCAINE 2% (20 MG/ML) 5 ML SYRINGE
INTRAMUSCULAR | Status: AC
  Filled 2016-05-08: qty 5

## 2016-05-08 MED ORDER — DEXAMETHASONE SODIUM PHOSPHATE 10 MG/ML IJ SOLN
INTRAMUSCULAR | Status: AC
  Filled 2016-05-08: qty 1

## 2016-05-08 MED ORDER — LACTATED RINGERS IV SOLN
INTRAVENOUS | Status: DC
  Administered 2016-05-08: 09:00:00 via INTRAVENOUS

## 2016-05-08 MED ORDER — MIDAZOLAM HCL 2 MG/2ML IJ SOLN
INTRAMUSCULAR | Status: AC
  Filled 2016-05-08: qty 2

## 2016-05-08 MED ORDER — ONDANSETRON HCL 4 MG/2ML IJ SOLN
INTRAMUSCULAR | Status: AC
  Filled 2016-05-08: qty 2

## 2016-05-08 MED ORDER — CHLORHEXIDINE GLUCONATE 4 % EX LIQD: 60.0000 mL | Freq: Once | CUTANEOUS | Status: DC

## 2016-05-08 MED ORDER — OXYCODONE-ACETAMINOPHEN 5-325 MG PO TABS: 2.0000 | ORAL_TABLET | ORAL | 0 refills | Status: DC | PRN

## 2016-05-08 MED ORDER — METHOCARBAMOL 500 MG PO TABS
500.0000 mg | ORAL_TABLET | Freq: Four times a day (QID) | ORAL | Status: DC | PRN
  Administered 2016-05-08: 500 mg via ORAL
  Filled 2016-05-08: qty 1

## 2016-05-08 MED ORDER — FENTANYL CITRATE (PF) 100 MCG/2ML IJ SOLN
INTRAMUSCULAR | Status: DC | PRN
  Administered 2016-05-08 (×2): 25 ug via INTRAVENOUS
  Administered 2016-05-08: 50 ug via INTRAVENOUS

## 2016-05-08 MED ORDER — CEFAZOLIN IN D5W 1 GM/50ML IV SOLN
1.0000 g | Freq: Three times a day (TID) | INTRAVENOUS | Status: DC
  Administered 2016-05-08 – 2016-05-09 (×2): 1 g via INTRAVENOUS
  Filled 2016-05-08 (×3): qty 50

## 2016-05-08 MED ORDER — VITAMIN C 500 MG PO TABS: 1000.0000 mg | ORAL_TABLET | Freq: Every day | ORAL | Status: DC

## 2016-05-08 MED ORDER — ROPIVACAINE HCL 5 MG/ML IJ SOLN
INTRAMUSCULAR | Status: DC | PRN
  Administered 2016-05-08: 20 mL via PERINEURAL

## 2016-05-08 MED ORDER — MIDAZOLAM HCL 5 MG/5ML IJ SOLN
INTRAMUSCULAR | Status: DC | PRN
  Administered 2016-05-08: 1 mg via INTRAVENOUS

## 2016-05-08 MED ORDER — PROPOFOL 10 MG/ML IV BOLUS
INTRAVENOUS | Status: AC
  Filled 2016-05-08: qty 20

## 2016-05-08 MED ORDER — PANTOPRAZOLE SODIUM 40 MG PO TBEC
40.0000 mg | DELAYED_RELEASE_TABLET | Freq: Every day | ORAL | Status: DC
  Administered 2016-05-08: 40 mg via ORAL

## 2016-05-08 MED ORDER — ALPRAZOLAM 0.5 MG PO TABS
0.5000 mg | ORAL_TABLET | Freq: Four times a day (QID) | ORAL | Status: DC | PRN
  Administered 2016-05-08: 0.5 mg via ORAL
  Filled 2016-05-08: qty 2

## 2016-05-08 MED ORDER — MIDAZOLAM HCL 2 MG/2ML IJ SOLN
1.0000 mg | INTRAMUSCULAR | Status: DC | PRN
  Administered 2016-05-08: 2 mg via INTRAVENOUS

## 2016-05-08 MED ORDER — DEXAMETHASONE SODIUM PHOSPHATE 4 MG/ML IJ SOLN
INTRAMUSCULAR | Status: DC | PRN
  Administered 2016-05-08: 8 mg via INTRAVENOUS

## 2016-05-08 MED ORDER — MEPERIDINE HCL 25 MG/ML IJ SOLN: 6.2500 mg | INTRAMUSCULAR | Status: DC | PRN

## 2016-05-08 MED ORDER — DOCUSATE SODIUM 100 MG PO CAPS: 100.0000 mg | ORAL_CAPSULE | Freq: Two times a day (BID) | ORAL | Status: DC

## 2016-05-08 MED FILL — METHOCARBAMOL 500 MG TABLET: 500 | 11 days supply | Qty: 45 | Fill #0

## 2016-05-08 MED FILL — OXYCODONE/APAP 5-325: 5-325 | 5 days supply | Qty: 60 | Fill #0

## 2016-05-08 SURGICAL SUPPLY — 76 items
ANCHOR DX SWIVELOCK SL 3.5X8.5 (Anchor) ×8 IMPLANT
BANDAGE ACE 3X5.8 VEL STRL LF (GAUZE/BANDAGES/DRESSINGS) ×2 IMPLANT
BLADE CLIPPER SURG (BLADE) IMPLANT
BLADE SURG 15 STRL LF DISP TIS (BLADE) ×2 IMPLANT
BLADE SURG 15 STRL SS (BLADE) ×2
BNDG CONFORM 3 STRL LF (GAUZE/BANDAGES/DRESSINGS) ×2 IMPLANT
BNDG GAUZE ELAST 4 BULKY (GAUZE/BANDAGES/DRESSINGS) ×2 IMPLANT
BRUSH SCRUB EZ PLAIN DRY (MISCELLANEOUS) ×2 IMPLANT
CANISTER SUCT 1200ML W/VALVE (MISCELLANEOUS) ×2 IMPLANT
CORDS BIPOLAR (ELECTRODE) ×2 IMPLANT
COVER BACK TABLE 60X90IN (DRAPES) ×2 IMPLANT
CUFF TOURNIQUET SINGLE 18IN (TOURNIQUET CUFF) ×2 IMPLANT
DECANTER SPIKE VIAL GLASS SM (MISCELLANEOUS) IMPLANT
DISPOSABLE KIT FOR DX SWIVELOCK SL 3.5 X 8.5 ×2 IMPLANT
DRAIN TLS ROUND 10FR (DRAIN) IMPLANT
DRAPE EXTREMITY T 121X128X90 (DRAPE) ×2 IMPLANT
DRAPE OEC MINIVIEW 54X84 (DRAPES) ×2 IMPLANT
DRAPE SURG 17X23 STRL (DRAPES) ×2 IMPLANT
DRSG ADAPTIC 3X8 NADH LF (GAUZE/BANDAGES/DRESSINGS) IMPLANT
DRSG EMULSION OIL 3X3 NADH (GAUZE/BANDAGES/DRESSINGS) ×2 IMPLANT
GAUZE SPONGE 4X4 12PLY STRL (GAUZE/BANDAGES/DRESSINGS) ×2 IMPLANT
GAUZE SPONGE 4X4 16PLY XRAY LF (GAUZE/BANDAGES/DRESSINGS) IMPLANT
GAUZE XEROFORM 5X9 LF (GAUZE/BANDAGES/DRESSINGS) IMPLANT
GLOVE BIO SURGEON STRL SZ 6.5 (GLOVE) ×2 IMPLANT
GLOVE BIOGEL M STRL SZ7.5 (GLOVE) IMPLANT
GLOVE BIOGEL PI IND STRL 7.0 (GLOVE) ×4 IMPLANT
GLOVE BIOGEL PI INDICATOR 7.0 (GLOVE) ×4
GLOVE ECLIPSE 6.5 STRL STRAW (GLOVE) ×4 IMPLANT
GLOVE SS BIOGEL STRL SZ 8 (GLOVE) ×1 IMPLANT
GLOVE SUPERSENSE BIOGEL SZ 8 (GLOVE) ×1
GOWN STRL REUS W/ TWL LRG LVL3 (GOWN DISPOSABLE) ×3 IMPLANT
GOWN STRL REUS W/ TWL XL LVL3 (GOWN DISPOSABLE) IMPLANT
GOWN STRL REUS W/TWL LRG LVL3 (GOWN DISPOSABLE) ×3
GOWN STRL REUS W/TWL XL LVL3 (GOWN DISPOSABLE) ×2 IMPLANT
GUIDEWIRE THREADED 150MM (WIRE) IMPLANT
K-WIRE .045X4 (WIRE) ×8 IMPLANT
NEEDLE FISTULA 1/2 CIRCLE (NEEDLE) ×2 IMPLANT
NEEDLE HYPO 22GX1.5 SAFETY (NEEDLE) IMPLANT
NEEDLE HYPO 25X1 1.5 SAFETY (NEEDLE) ×2 IMPLANT
NS IRRIG 1000ML POUR BTL (IV SOLUTION) ×2 IMPLANT
PACK BASIN DAY SURGERY FS (CUSTOM PROCEDURE TRAY) ×2 IMPLANT
PAD CAST 3X4 CTTN HI CHSV (CAST SUPPLIES) ×2 IMPLANT
PADDING CAST ABS 3INX4YD NS (CAST SUPPLIES) ×1
PADDING CAST ABS 4INX4YD NS (CAST SUPPLIES) ×1
PADDING CAST ABS COTTON 3X4 (CAST SUPPLIES) ×1 IMPLANT
PADDING CAST ABS COTTON 4X4 ST (CAST SUPPLIES) ×1 IMPLANT
PADDING CAST COTTON 3X4 STRL (CAST SUPPLIES) ×2
PASSER SUT SWANSON 36MM LOOP (INSTRUMENTS) IMPLANT
SHEET MEDIUM DRAPE 40X70 STRL (DRAPES) ×2 IMPLANT
SLEEVE SCD COMPRESS KNEE MED (MISCELLANEOUS) ×2 IMPLANT
SLING ARM FOAM STRAP LRG (SOFTGOODS) ×2 IMPLANT
SPLINT FIBERGLASS 3X35 (CAST SUPPLIES) IMPLANT
SPLINT FIBERGLASS 4X30 (CAST SUPPLIES) ×2 IMPLANT
SPLINT PLASTER CAST XFAST 3X15 (CAST SUPPLIES) IMPLANT
SPLINT PLASTER XTRA FASTSET 3X (CAST SUPPLIES)
SPONGE SURGIFOAM ABS GEL 12-7 (HEMOSTASIS) IMPLANT
STOCKINETTE 4X48 STRL (DRAPES) ×2 IMPLANT
STOCKINETTE SYNTHETIC 3 UNSTER (CAST SUPPLIES) ×2 IMPLANT
SUCTION FRAZIER HANDLE 10FR (MISCELLANEOUS) ×1
SUCTION TUBE FRAZIER 10FR DISP (MISCELLANEOUS) ×1 IMPLANT
SUT BONE WAX W31G (SUTURE) IMPLANT
SUT FIBERWIRE 3-0 18 TAPR NDL (SUTURE) ×2
SUT FIBERWIRE 4-0 18 TAPR NDL (SUTURE)
SUT PROLENE 4 0 PS 2 18 (SUTURE) ×2 IMPLANT
SUT VIC AB 4-0 P-3 18XBRD (SUTURE) ×1 IMPLANT
SUT VIC AB 4-0 P3 18 (SUTURE) ×1
SUTURE FIBERWR 3-0 18 TAPR NDL (SUTURE) ×1 IMPLANT
SUTURE FIBERWR 4-0 18 TAPR NDL (SUTURE) IMPLANT
SYR BULB 3OZ (MISCELLANEOUS) ×2 IMPLANT
SYR CONTROL 10ML LL (SYRINGE) IMPLANT
TAPE SURG TRANSPORE 1 IN (GAUZE/BANDAGES/DRESSINGS) ×1 IMPLANT
TAPE SURGICAL TRANSPORE 1 IN (GAUZE/BANDAGES/DRESSINGS) ×1
TOWEL OR 17X24 6PK STRL BLUE (TOWEL DISPOSABLE) ×4 IMPLANT
TOWEL OR NON WOVEN STRL DISP B (DISPOSABLE) ×2 IMPLANT
TUBE CONNECTING 20X1/4 (TUBING) ×2 IMPLANT
UNDERPAD 30X30 (UNDERPADS AND DIAPERS) ×2 IMPLANT

## 2016-05-08 NOTE — Op Note (Signed)
See [id number] Amedeo Plenty MD

## 2016-05-08 NOTE — Discharge Instructions (Addendum)
Keep bandage clean and dry.  Call for any problems.  No smoking.  Criteria for driving a car: you should be off your pain medicine for 7-8 hours, able to drive one handed(confident), thinking clearly and feeling able in your judgement to drive. °Continue elevation as it will decrease swelling.  If instructed by MD move your fingers within the confines of the bandage/splint.  Use ice if instructed by your MD. Call immediately for any sudden loss of feeling in your hand/arm or change in functional abilities of the extremity.We recommend that you to take vitamin C 1000 mg a day to promote healing. °We also recommend that if you require  pain medicine that you take a stool softener to prevent constipation as most pain medicines will have constipation side effects. We recommend either Peri-Colace or Senokot and recommend that you also consider adding MiraLAX as well to prevent the constipation affects from pain medicine if you are required to use them. These medicines are over the counter and may be purchased at a local pharmacy. A cup of yogurt and a probiotic can also be helpful during the recovery process as the medicines can disrupt your intestinal environment. ° ° ° °Post Anesthesia Home Care Instructions ° °Activity: °Get plenty of rest for the remainder of the day. A responsible adult should stay with you for 24 hours following the procedure.  °For the next 24 hours, DO NOT: °-Drive a car °-Operate machinery °-Drink alcoholic beverages °-Take any medication unless instructed by your physician °-Make any legal decisions or sign important papers. ° °Meals: °Start with liquid foods such as gelatin or soup. Progress to regular foods as tolerated. Avoid greasy, spicy, heavy foods. If nausea and/or vomiting occur, drink only clear liquids until the nausea and/or vomiting subsides. Call your physician if vomiting continues. ° °Special Instructions/Symptoms: °Your throat may feel dry or sore from the anesthesia or the  breathing tube placed in your throat during surgery. If this causes discomfort, gargle with warm salt water. The discomfort should disappear within 24 hours. ° °If you had a scopolamine patch placed behind your ear for the management of post- operative nausea and/or vomiting: ° °1. The medication in the patch is effective for 72 hours, after which it should be removed.  Wrap patch in a tissue and discard in the trash. Wash hands thoroughly with soap and water. °2. You may remove the patch earlier than 72 hours if you experience unpleasant side effects which may include dry mouth, dizziness or visual disturbances. °3. Avoid touching the patch. Wash your hands with soap and water after contact with the patch. °  ° °

## 2016-05-08 NOTE — Anesthesia Procedure Notes (Signed)
Anesthesia Regional Block:  Supraclavicular block  Pre-Anesthetic Checklist: ,, timeout performed, Correct Patient, Correct Site, Correct Laterality, Correct Procedure, Correct Position, site marked, Risks and benefits discussed,  Surgical consent,  Pre-op evaluation,  At surgeon's request and post-op pain management  Laterality: Left  Prep: chloraprep       Needles:  Injection technique: Single-shot  Needle Type: Stimiplex     Needle Length: 9cm 9 cm Needle Gauge: 21 G    Additional Needles:  Procedures: ultrasound guided (picture in chart) Supraclavicular block Narrative:  Injection made incrementally with aspirations every 5 mL.  Performed by: Personally  Anesthesiologist: Reeanna Acri  Additional Notes: Risks, benefits and alternative to block explained extensively.  Patient tolerated procedure well, without complications.

## 2016-05-08 NOTE — Anesthesia Preprocedure Evaluation (Addendum)
Anesthesia Evaluation  Patient identified by MRN, date of birth, ID band Patient awake    Reviewed: Allergy & Precautions, NPO status , Patient's Chart, lab work & pertinent test results  Airway Mallampati: III  TM Distance: <3 FB   Mouth opening: Limited Mouth Opening  Dental  (+) Teeth Intact   Pulmonary former smoker,    breath sounds clear to auscultation       Cardiovascular negative cardio ROS   Rhythm:Regular Rate:Tachycardia     Neuro/Psych PSYCHIATRIC DISORDERS Anxiety negative neurological ROS     GI/Hepatic Neg liver ROS, GERD  Medicated,  Endo/Other  negative endocrine ROS  Renal/GU negative Renal ROS  negative genitourinary   Musculoskeletal  (+) Arthritis ,   Abdominal   Peds negative pediatric ROS (+)  Hematology negative hematology ROS (+)   Anesthesia Other Findings   Reproductive/Obstetrics negative OB ROS                            Lab Results  Component Value Date   WBC 5.6 08/14/2015   HGB 13.3 08/14/2015   HCT 40.4 08/14/2015   MCV 96.4 08/14/2015   PLT 282 08/14/2015   Lab Results  Component Value Date   INR 1.0 10/16/2011     Anesthesia Physical Anesthesia Plan  ASA: II  Anesthesia Plan: General and Regional   Post-op Pain Management: GA combined w/ Regional for post-op pain   Induction: Intravenous  Airway Management Planned: LMA  Additional Equipment:   Intra-op Plan:   Post-operative Plan: Extubation in OR  Informed Consent: I have reviewed the patients History and Physical, chart, labs and discussed the procedure including the risks, benefits and alternatives for the proposed anesthesia with the patient or authorized representative who has indicated his/her understanding and acceptance.   Dental advisory given  Plan Discussed with: CRNA  Anesthesia Plan Comments:        Anesthesia Quick Evaluation

## 2016-05-08 NOTE — Anesthesia Procedure Notes (Signed)
Date/Time: 05/08/2016 9:26 AM Performed by: Rayvon Char Pre-anesthesia Checklist: Patient identified, Suction available and Patient being monitored Patient Re-evaluated:Patient Re-evaluated prior to inductionOxygen Delivery Method: Circle system utilized Preoxygenation: Pre-oxygenation with 100% oxygen Intubation Type: IV induction LMA: LMA inserted Laser Tube: Cuffed inflated with minimal occlusive pressure - saline Number of attempts: 1 Dental Injury: Teeth and Oropharynx as per pre-operative assessment

## 2016-05-08 NOTE — Transfer of Care (Signed)
Immediate Anesthesia Transfer of Care Note  Patient: Jamie Coleman  Procedure(s) Performed: Procedure(s): LEFT WRIST SCAPHOLUNATE LIGAMENT RECONSTRUCTION WITH TENDON GRAFT PIN NEURECTOMY AND REPAIR (Left)  Patient Location: PACU  Anesthesia Type:General and Regional  Level of Consciousness: awake and patient cooperative  Airway & Oxygen Therapy: Patient connected to nasal cannula oxygen  Post-op Assessment: Report given to RN  Post vital signs: Reviewed  Last Vitals:  Vitals:   05/08/16 1121 05/08/16 1130  BP: 112/77 103/73  Pulse: 90 (!) 59  Resp:  13  Temp: 36.9 C     Last Pain:  Vitals:   05/08/16 0746  TempSrc: Oral         Complications: No apparent anesthesia complications

## 2016-05-08 NOTE — Anesthesia Postprocedure Evaluation (Signed)
Anesthesia Post Note  Patient: Maye Hyppolite  Procedure(s) Performed: Procedure(s) (LRB): LEFT WRIST SCAPHOLUNATE LIGAMENT RECONSTRUCTION WITH TENDON GRAFT PIN NEURECTOMY AND REPAIR (Left)  Patient location during evaluation: PACU Anesthesia Type: General and Regional Level of consciousness: awake and alert Pain management: pain level controlled Vital Signs Assessment: post-procedure vital signs reviewed and stable Respiratory status: spontaneous breathing, nonlabored ventilation, respiratory function stable and patient connected to nasal cannula oxygen Cardiovascular status: blood pressure returned to baseline and stable Postop Assessment: no signs of nausea or vomiting Anesthetic complications: no    Last Vitals:  Vitals:   05/08/16 1121 05/08/16 1130  BP: 112/77 103/73  Pulse: 90 (!) 59  Resp:  13  Temp: 36.9 C     Last Pain:  Vitals:   05/08/16 0746  TempSrc: Oral                 Zenaida Deed

## 2016-05-08 NOTE — H&P (Signed)
[name] is an 45 y.o. female.   Chief Complaint: Left wrist scapholunate instability HPI: Patient presents for left wrist reconstruction. We're planning a scapholunate ligament reconstruction with free tendon graft Patient presents for evaluation and treatment of the of their upper extremity predicament. The patient denies neck, back, chest or  abdominal pain. The patient notes that they have no lower extremity problems. The patients primary complaint is noted. We are planning surgical care pathway for the upper extremity.  Past Medical History:  Diagnosis Date  . Allergy    allergic rhinitis  . Anxiety    situational  . Difficult intubation    states 2012 ablation surgery, had diff intubation,glide scope used, not noted in EPIC anesthesia record  . Elevated hemoglobin A1c 08/08/15  . Elevated liver function tests 08/08/15  . GERD (gastroesophageal reflux disease)   . Hyperlipidemia   . Low serum vitamin D 08/08/15  . Status post endometrial ablation   . Tennis elbow    currently in PT-10/16    Past Surgical History:  Procedure Laterality Date  . CESAREAN SECTION    . ENDOMETRIAL ABLATION     8/12  . FOOT SURGERY    . LAPAROSCOPIC TUBAL LIGATION  06/09/2011   Procedure: LAPAROSCOPIC TUBAL LIGATION;  Surgeon: Arloa Koh;  Location: [facility];  Service: Gynecology;  Laterality: N/A;  . TONSILLECTOMY     as child  . TUBAL LIGATION  8/12    Family History  Problem Relation Age of Onset  . Cancer Mother     CA insitu of appendix  . Heart disease Father     CAD  . Diabetes Father     type II  . Diabetes Brother     type II  . Diabetes Maternal Grandfather   . Diabetes Paternal Grandmother   . Diabetes Paternal Grandfather    Social History:  reports that she quit smoking about 32 years ago. She has never used smokeless tobacco. She reports that she drinks about 6.0 oz of alcohol per week . She reports that she does not use drugs.  Allergies:  Allergies   Allergen Reactions  . Lipitor [Atorvastatin]     myalgias    Medications Prior to Admission  Medication Sig Dispense Refill  . ALPRAZolam (XANAX) 0.5 MG tablet Take 1 tablet (0.5 mg total) by mouth 2 (two) times daily as needed for anxiety. 30 tablet 3  . Calcium Carbonate-Vit D-Min 600-400 MG-UNIT TABS Take 1 tablet by mouth 2 (two) times daily.     . cetirizine (ZYRTEC) 10 MG tablet Take 10 mg by mouth daily as needed for allergies.     . hydrocortisone (ANUSOL-HC) 25 MG suppository Place 1 suppository (25 mg total) rectally 2 (two) times daily. 12 suppository 1  . ibuprofen (ADVIL,MOTRIN) 800 MG tablet Take 1 tablet (800 mg total) by mouth every 8 (eight) hours as needed (with food). 90 tablet 2  . Multiple Vitamin (MULTIVITAMIN) capsule Take 1 capsule by mouth daily.      . Omega-3 Fatty Acids (FISH OIL PO) Take by mouth daily.      . pantoprazole (PROTONIX) 40 MG tablet Take 1 tablet (40 mg total) by mouth daily as needed. 30 tablet 11    No results found for this or any previous visit (from the past 48 hour(s)). No results found.  ROS  Blood pressure 115/83, pulse 83, temperature 98.6 F (37 C), temperature source Oral, resp. rate 13, height 5\' 2"  (1.575 m), weight 83.6  kg (184 lb 3.2 oz), SpO2 100 %. Physical Exam left wrist scapholunate instability with positive scaphoid shift test. She is painful and has chronic deformity. Her preoperative studies including MRI support the diagnosis The patient is alert and oriented in no acute distress. The patient complains of pain in the affected upper extremity.  The patient is noted to have a normal HEENT exam. Lung fields show equal chest expansion and no shortness of breath. Abdomen exam is nontender without distention. Lower extremity examination does not show any fracture dislocation or blood clot symptoms. Pelvis is stable and the neck and back are stable and nontender. Assessment/Plan Plan for left wrist scapholunate  interosseous ligament reconstruction with associated PIN neurectomy and external fixation is necessary.  Patient stands all risk and benefits. Patient understands time for progression of recovery. We are planning surgery for your upper extremity. The risk and benefits of surgery to include risk of bleeding, infection, anesthesia,  damage to normal structures and failure of the surgery to accomplish its intended goals of relieving symptoms and restoring function have been discussed in detail. With this in mind we plan to proceed. I have specifically discussed with the patient the pre-and postoperative regime and the dos and don'ts and risk and benefits in great detail. Risk and benefits of surgery also include risk of dystrophy(CRPS), chronic nerve pain, failure of the healing process to go onto completion and other inherent risks of surgery The relavent the pathophysiology of the disease/injury process, as well as the alternatives for treatment and postoperative course of action has been discussed in great detail with the patient who desires to proceed.  We will do everything in our power to help you (the patient) restore function to the upper extremity. It is a pleasure to see this patient today.  Paulene Floor, MD 05/08/2016, 8:55 AM

## 2016-05-08 NOTE — Progress Notes (Signed)
Assisted Dr. [place] Pink with left, ultrasound guided, supraclavicular block. Side rails up, monitors on throughout procedure. See vital signs in flow sheet. Tolerated Procedure well.

## 2016-05-09 DIAGNOSIS — F419 Anxiety disorder, unspecified: Secondary | ICD-10-CM | POA: Diagnosis not present

## 2016-05-09 DIAGNOSIS — M25332 Other instability, left wrist: Secondary | ICD-10-CM | POA: Diagnosis not present

## 2016-05-09 DIAGNOSIS — S63592A Other specified sprain of left wrist, initial encounter: Secondary | ICD-10-CM | POA: Diagnosis not present

## 2016-05-09 DIAGNOSIS — E785 Hyperlipidemia, unspecified: Secondary | ICD-10-CM | POA: Diagnosis not present

## 2016-05-09 DIAGNOSIS — K219 Gastro-esophageal reflux disease without esophagitis: Secondary | ICD-10-CM | POA: Diagnosis not present

## 2016-05-09 DIAGNOSIS — Z87891 Personal history of nicotine dependence: Secondary | ICD-10-CM | POA: Diagnosis not present

## 2016-05-09 NOTE — Discharge Summary (Signed)
Physician Discharge Summary  Patient ID: Jamie Coleman MRN: [id number] DOB/AGE: 46/22/1972 46 y.o.  Admit date: 05/08/2016 Discharge date:   Admission Diagnoses: left wrist scapholunate rupture Past Medical History:  Diagnosis Date  . Allergy    allergic rhinitis  . Anxiety    situational  . Difficult intubation    states 2012 ablation surgery, had diff intubation,glide scope used, not noted in EPIC anesthesia record  . Elevated hemoglobin A1c 08/08/15  . Elevated liver function tests 08/08/15  . GERD (gastroesophageal reflux disease)   . Hyperlipidemia   . Low serum vitamin D 08/08/15  . Status post endometrial ablation   . Tennis elbow    currently in PT-10/16    Discharge Diagnoses:  Active Problems:   Scapholunate dissociation of left wrist   Surgeries: Procedure(s): LEFT WRIST SCAPHOLUNATE LIGAMENT RECONSTRUCTION WITH TENDON GRAFT PIN NEURECTOMY AND REPAIR on 05/08/2016    Consultants:   Discharged Condition: Improved  Hospital Course: Jamie Coleman is an 45 y.o. female who was admitted 05/08/2016 with a chief complaint of No chief complaint on file. , and found to have a diagnosis of left wrist scapholunate rupture.  They were brought to the operating room on 05/08/2016 and underwent Procedure(s): LEFT WRIST SCAPHOLUNATE LIGAMENT RECONSTRUCTION WITH TENDON GRAFT PIN NEURECTOMY AND REPAIR.    They were given perioperative antibiotics: Anti-infectives    Start     Dose/Rate Route Frequency Ordered Stop   05/08/16 1400  ceFAZolin (ANCEF) IVPB 1 g/50 mL premix     1 g 100 mL/hr over 30 Minutes Intravenous Every 8 hours 05/08/16 1326     05/08/16 0731  ceFAZolin (ANCEF) IVPB 2g/100 mL premix     2 g 200 mL/hr over 30 Minutes Intravenous On call to O.R. 05/08/16 [id number] 05/08/16 0932    .  They were given sequential compression devices, early ambulation, and Other (comment)   for DVT prophylaxis.  Recent vital signs: Patient Vitals for the past 24 hrs:  BP Temp  Pulse Resp SpO2  05/09/16 0730 128/85 98 F (36.7 C) 62 16 99 %  05/09/16 0715 - - 73 - 99 %  05/09/16 0707 - - 67 - 96 %  05/09/16 0415 - - - - 96 %  05/09/16 0330 - - 68 - 100 %  05/09/16 0300 - - - - 99 %  05/09/16 0200 - - 61 - 96 %  05/09/16 0100 106/70 98.6 F (37 C) 67 16 98 %  05/09/16 0000 - - 88 - 96 %  05/08/16 2300 - - 84 - 98 %  05/08/16 2137 129/87 97.5 F (36.4 C) (!) 101 18 98 %  05/08/16 2115 (!) 142/92 - (!) 114 - 100 %  05/08/16 2030 (!) 148/90 - (!) 110 - 99 %  05/08/16 1945 - - 99 - 99 %  05/08/16 1900 132/90 - (!) 108 - 100 %  05/08/16 1830 128/81 - (!) 114 - 100 %  05/08/16 1815 - - (!) 104 - -  05/08/16 1800 (!) 168/101 - (!) 141 18 100 %  05/08/16 1700 - - 60 - 100 %  05/08/16 1600 121/84 98.1 F (36.7 C) 64 16 100 %  05/08/16 1530 - - 74 - 100 %  05/08/16 1430 - - 61 - 99 %  05/08/16 1330 - - 62 - 99 %  05/08/16 1230 121/78 98.2 F (36.8 C) 67 16 95 %  05/08/16 1215 111/73 - 68 12 94 %  05/08/16 1200 113/69 - 66  11 96 %  05/08/16 1145 99/66 - 62 15 93 %  05/08/16 1130 103/73 - (!) 59 13 99 %  05/08/16 1121 112/77 98.4 F (36.9 C) 90 - 99 %  05/08/16 1120 - - 91 - 99 %  05/08/16 0830 115/83 - 83 13 100 %  05/08/16 0825 - - 92 16 100 %  05/08/16 0820 - - (!) 102 18 100 %  .  Recent laboratory studies: No results found.  Discharge Medications:     Medication List    TAKE these medications   ALPRAZolam 0.5 MG tablet Commonly known as:  XANAX Take 1 tablet (0.5 mg total) by mouth 2 (two) times daily as needed for anxiety.   Calcium Carbonate-Vit D-Min 600-400 MG-UNIT Tabs Take 1 tablet by mouth 2 (two) times daily.   cetirizine 10 MG tablet Commonly known as:  ZYRTEC Take 10 mg by mouth daily as needed for allergies.   FISH OIL PO Take by mouth daily.   hydrocortisone 25 MG suppository Commonly known as:  ANUSOL-HC Place 1 suppository (25 mg total) rectally 2 (two) times daily.   ibuprofen 800 MG tablet Commonly known as:   ADVIL,MOTRIN Take 1 tablet (800 mg total) by mouth every 8 (eight) hours as needed (with food).   methocarbamol 500 MG tablet Commonly known as:  ROBAXIN Take 1 tablet (500 mg total) by mouth 4 (four) times daily.   multivitamin capsule Take 1 capsule by mouth daily.   oxyCODONE-acetaminophen 5-325 MG tablet Commonly known as:  ROXICET Take 2 tablets by mouth every 4 (four) hours as needed for severe pain.   pantoprazole 40 MG tablet Commonly known as:  PROTONIX Take 1 tablet (40 mg total) by mouth daily as needed.       Diagnostic Studies: No results found.  They benefited maximally from their hospital stay and there were no complications.     Disposition: 01-Home or Self Care   status post scapholunate interosseous ligament reconstruction left wrist.  Patient is doing well at present time. She has some significant anxiety last night.  I wrote for OxyIR and Ativan. The computer systems were down/hand thus I hand wrote the prescription.  She'll eat a regular diet.  Elevate move massage the fingers will be very important and she understands this. She has my cell phone should any problems arise.   SignedPaulene Floor 05/09/2016, 8:01 AM

## 2016-05-11 ENCOUNTER — Encounter (HOSPITAL_BASED_OUTPATIENT_CLINIC_OR_DEPARTMENT_OTHER): Payer: Self-pay | Admitting: Orthopedic Surgery

## 2016-05-11 NOTE — Addendum Note (Signed)
Addendum  created 05/11/16 [id number] by Ernesta Amble Amiliana Foutz, CRNA   Charge Capture section accepted

## 2016-05-12 NOTE — Op Note (Signed)
NAME:  Jamie Coleman, Jamie Coleman                  ACCOUNT NO.:  MEDICAL RECORD NO.:  [id number]  LOCATION:                                 FACILITY:  PHYSICIAN:  Satira Anis. Ryanne Morand, M.D.DATE OF BIRTH:  10/20/1970  DATE OF PROCEDURE: DATE OF DISCHARGE:                              OPERATIVE REPORT   PREOPERATIVE DIAGNOSIS:  Scapholunate interosseous ligament tear with instability and chronic wrist pain, left upper extremity.  POSTOPERATIVE DIAGNOSIS:  Scapholunate interosseous ligament tear with instability and chronic wrist pain, left upper extremity.  PROCEDURE: 1. Left wrist evaluation under anesthesia. 2. Left wrist scapholunate interosseous ligament reconstruction with     free tendon graft utilizing the ECRB and a scapholunate     reconstruction utilizing the Arthrex 3.5 swivel locks with 3 point     fixation technique as described below.  This was a scapholunate     interosseous ligament reconstruction with additional capsulodesis. 3. Posterior interosseous nerve neurectomy, left wrist. 4. External fixation with Kirschner wire, left wrist. 5. EPL decompression and anterior transposition to the soft tissues,     left wrist.  SURGEON:  Satira Anis. Amedeo Plenty, M.D.  ASSISTANT:  None.  COMPLICATIONS:  None.  ANESTHESIA:  General with preoperative block.  TOURNIQUET TIME:  Less than 90 minutes.  DRAINS:  None.  INDICATIONS:  This patient is a 45 year old female who presents with the above-mentioned diagnosis.  I have counseled her in regard to risks and benefits of surgery ,and she desires to proceed with the above-mentioned operative intervention.  All questions have been encouraged and answered preoperatively.  Do's and dont's discussed and all questions addressed. I have discussed that our goal was to give her a wrist which is stable and does not have the chronic instability which will lead to arthritis. She understands there will be some degree of loss of motion, but at  the same time understands that we are hoping to stabilize the wrist so that she does not go on to further arthritis.  I have discussed with her challenges of scapholunate interosseous ligament reconstruction and issues and scenarios of traumatic arthritis.  OPERATION IN DETAIL:  The patient was seen by myself and Anesthesia, taken to the operative theater and underwent smooth induction of general anesthetic.  She was laid supine, given a Hibiclens, prescrubbed by myself followed by 10 minute surgical Betadine scrub and paint.  Once this was complete, the patient then underwent a very careful and cautious approach to the extremity with elevation of the tourniquet. Time-out was called.  Preoperative antibiotics were given.  Incision was made.  Dissection was carried down.  The EPL was identified, it was released and transposing the anterior soft tissue.  The interval between the second and fourth dorsal compartment was created.  Posterior interosseous nerve neurectomy was accomplished with crushing and cauterization technique along a 4 cm length.  Following this, we entered the capsule, which was unstable as demonstrated on evaluation under anesthesia.  Capsule was incised.  The TFC was fairly stable.  The patient did not have any instability ulnarly.  At this time, we then performed evaluation of the scapholunate interval which was completely devoid of meaningful attachments  and completely unstable.  At this time, we then placed joysticks for the scaphoid and lunate.  Following this, I then harvested 2 mm x 11 cm long graft.  This was up to 3 mm in some locations as I preplanned this.  Following this, we then very carefully and cautiously placed joystick drill holes for the receiving 3.5 swivel locks.  These were checked under x-ray and looked excellent.  I then reamed for the holes and then in doing so, made sure that the holes looked excellent and were able to be receptive of a  swivel lock.  Following this, we then very carefully and cautiously performed placement of a scaphocapitate in a scapholunate pin to secure the reduced scapholunate.  The scapholunate was reduced nicely and there were no complicating features.  Following this, I then placed the graft in the scaphoid proximal pole and it was tensioned accordingly.  This was secured with swivel lock. Following this, I secured the graft in the lunate and then in the distal pole of the scaphoid.  Additional FiberWire which was attached to the area was used for imbrication and scaffolding.  I brought it back up through the capsular tissue for additional support.  Thus, scapholunate interosseous ligament reconstruction with free tendon graft and fiber loop was accomplished without difficulty according to standard technique.  The 3.5 swivel locks were used for fixation at 3 points (2 in the scaphoid, 1 in the lunate).  The patient had excellent tension and no complicating features.  I was quite pleased with this. External fixation with 2.045 Kirschner wires were pre-placed prior to the fixation and looked excellent.  Following this, the patient then underwent a very careful and cautious approach to the closure with a capsulodesis being imbricated followed by leaving the EPL in a transposed position.  The tourniquet was deflated, hemostasis secured, and wound was closed with the retinacular sleeve sewn over the fourth dorsal compartment.  Irrigation was applied.  There were no complicating features. Following this, we then closed the wound with Prolene.  All sponge, needle, and instrument counts were reported as correct.  The patient tolerated the procedure well.  There were no issues or problems.  I will see her in the morning and plan for discharge from our recovery care center.  She will be admitted for IV antibiotics, general postop observation, and pain management.  Elevate, move, and massage  fingers, pins out at 8 weeks roughly, and return to see me in the office in 12-14 days for suture removal.  I want to keep it completely still for 8 weeks as her graft is going to need time to heal in.  I have discussed with her relevant issues, do's and don'ts, etc.  Pleasure to see her today and participate in her care.  All questions have been addressed.     Satira Anis. Amedeo Plenty, M.D.     [facility]  D:  05/08/2016  T:  05/09/2016  Job:  [id number]

## 2016-05-18 ENCOUNTER — Other Ambulatory Visit: Payer: Self-pay | Admitting: Obstetrics and Gynecology

## 2016-05-18 ENCOUNTER — Ambulatory Visit
Admission: RE | Admit: 2016-05-18 | Discharge: 2016-05-18 | Disposition: A | Discharge status: Home / Self Care | Payer: 59 | Source: Ambulatory Visit | Attending: Obstetrics and Gynecology | Admitting: Obstetrics and Gynecology

## 2016-05-18 DIAGNOSIS — R921 Mammographic calcification found on diagnostic imaging of breast: Secondary | ICD-10-CM | POA: Diagnosis not present

## 2016-05-20 ENCOUNTER — Ambulatory Visit
Admission: RE | Admit: 2016-05-20 | Discharge: 2016-05-20 | Disposition: A | Discharge status: Home / Self Care | Payer: 59 | Source: Ambulatory Visit | Attending: Obstetrics and Gynecology | Admitting: Obstetrics and Gynecology

## 2016-05-20 DIAGNOSIS — R921 Mammographic calcification found on diagnostic imaging of breast: Secondary | ICD-10-CM

## 2016-05-20 DIAGNOSIS — D241 Benign neoplasm of right breast: Secondary | ICD-10-CM | POA: Diagnosis not present

## 2016-05-21 DIAGNOSIS — M25532 Pain in left wrist: Secondary | ICD-10-CM | POA: Diagnosis not present

## 2016-05-21 DIAGNOSIS — Z4789 Encounter for other orthopedic aftercare: Secondary | ICD-10-CM | POA: Diagnosis not present

## 2016-05-26 DIAGNOSIS — S63302D Traumatic rupture of unspecified ligament of left wrist, subsequent encounter: Secondary | ICD-10-CM | POA: Diagnosis not present

## 2016-05-27 DIAGNOSIS — Z4789 Encounter for other orthopedic aftercare: Secondary | ICD-10-CM | POA: Diagnosis not present

## 2016-05-29 MED FILL — oxyCODONE HCL 5 MG TABS: 5 | 6 days supply | Qty: 40 | Fill #0

## 2016-06-03 DIAGNOSIS — M25332 Other instability, left wrist: Secondary | ICD-10-CM | POA: Diagnosis not present

## 2016-06-03 DIAGNOSIS — Z4789 Encounter for other orthopedic aftercare: Secondary | ICD-10-CM | POA: Diagnosis not present

## 2016-06-17 DIAGNOSIS — M25332 Other instability, left wrist: Secondary | ICD-10-CM | POA: Diagnosis not present

## 2016-06-17 DIAGNOSIS — Z4789 Encounter for other orthopedic aftercare: Secondary | ICD-10-CM | POA: Diagnosis not present

## 2016-06-29 MED FILL — ALPRAZolam 0.5 MG TABS: 0.5 | 15 days supply | Qty: 30 | Fill #1

## 2016-06-29 MED FILL — PANTOPRAZOLE SOD DR 40 MG T: 40 | 90 days supply | Qty: 90 | Fill #2

## 2016-07-09 DIAGNOSIS — Z4789 Encounter for other orthopedic aftercare: Secondary | ICD-10-CM | POA: Diagnosis not present

## 2016-07-09 DIAGNOSIS — M25332 Other instability, left wrist: Secondary | ICD-10-CM | POA: Diagnosis not present

## 2016-07-10 ENCOUNTER — Encounter (HOSPITAL_BASED_OUTPATIENT_CLINIC_OR_DEPARTMENT_OTHER): Payer: Self-pay | Admitting: *Deleted

## 2016-07-15 ENCOUNTER — Other Ambulatory Visit: Payer: Self-pay | Admitting: Orthopedic Surgery

## 2016-07-15 ENCOUNTER — Ambulatory Visit: Payer: Self-pay | Admitting: Orthopedic Surgery

## 2016-07-16 ENCOUNTER — Encounter (HOSPITAL_BASED_OUTPATIENT_CLINIC_OR_DEPARTMENT_OTHER)
Admission: RE | Disposition: A | Discharge status: Home / Self Care | Payer: Self-pay | Source: Ambulatory Visit | Attending: Orthopedic Surgery

## 2016-07-16 ENCOUNTER — Ambulatory Visit (HOSPITAL_BASED_OUTPATIENT_CLINIC_OR_DEPARTMENT_OTHER): Payer: 59 | Admitting: Anesthesiology

## 2016-07-16 ENCOUNTER — Encounter (HOSPITAL_BASED_OUTPATIENT_CLINIC_OR_DEPARTMENT_OTHER): Payer: Self-pay

## 2016-07-16 ENCOUNTER — Ambulatory Visit (HOSPITAL_BASED_OUTPATIENT_CLINIC_OR_DEPARTMENT_OTHER)
Admission: RE | Admit: 2016-07-16 | Discharge: 2016-07-16 | Disposition: A | Discharge status: Home / Self Care | Payer: 59 | Source: Ambulatory Visit | Attending: Orthopedic Surgery | Admitting: Orthopedic Surgery

## 2016-07-16 DIAGNOSIS — Z8249 Family history of ischemic heart disease and other diseases of the circulatory system: Secondary | ICD-10-CM | POA: Insufficient documentation

## 2016-07-16 DIAGNOSIS — J309 Allergic rhinitis, unspecified: Secondary | ICD-10-CM | POA: Diagnosis not present

## 2016-07-16 DIAGNOSIS — Z87891 Personal history of nicotine dependence: Secondary | ICD-10-CM | POA: Diagnosis not present

## 2016-07-16 DIAGNOSIS — T84498D Other mechanical complication of other internal orthopedic devices, implants and grafts, subsequent encounter: Secondary | ICD-10-CM | POA: Diagnosis not present

## 2016-07-16 DIAGNOSIS — Z833 Family history of diabetes mellitus: Secondary | ICD-10-CM | POA: Insufficient documentation

## 2016-07-16 DIAGNOSIS — K219 Gastro-esophageal reflux disease without esophagitis: Secondary | ICD-10-CM | POA: Diagnosis not present

## 2016-07-16 DIAGNOSIS — F4322 Adjustment disorder with anxiety: Secondary | ICD-10-CM | POA: Diagnosis not present

## 2016-07-16 DIAGNOSIS — E785 Hyperlipidemia, unspecified: Secondary | ICD-10-CM | POA: Diagnosis not present

## 2016-07-16 DIAGNOSIS — Z472 Encounter for removal of internal fixation device: Secondary | ICD-10-CM | POA: Diagnosis not present

## 2016-07-16 DIAGNOSIS — E559 Vitamin D deficiency, unspecified: Secondary | ICD-10-CM | POA: Insufficient documentation

## 2016-07-16 DIAGNOSIS — R7989 Other specified abnormal findings of blood chemistry: Secondary | ICD-10-CM | POA: Insufficient documentation

## 2016-07-16 DIAGNOSIS — Z800 Family history of malignant neoplasm of digestive organs: Secondary | ICD-10-CM | POA: Diagnosis not present

## 2016-07-16 DIAGNOSIS — T8484XA Pain due to internal orthopedic prosthetic devices, implants and grafts, initial encounter: Secondary | ICD-10-CM | POA: Diagnosis not present

## 2016-07-16 HISTORY — PX: EXTREMITY WIRE/PIN REMOVAL: SHX5051

## 2016-07-16 SURGERY — REMOVAL, PIN, EXTREMITY
Anesthesia: General | Site: Wrist | Laterality: Left

## 2016-07-16 MED ORDER — KETOROLAC TROMETHAMINE 30 MG/ML IJ SOLN
INTRAMUSCULAR | Status: DC | PRN
  Administered 2016-07-16: 30 mg via INTRAVENOUS

## 2016-07-16 MED ORDER — DEXAMETHASONE SODIUM PHOSPHATE 4 MG/ML IJ SOLN
INTRAMUSCULAR | Status: DC | PRN
  Administered 2016-07-16: 10 mg via INTRAVENOUS

## 2016-07-16 MED ORDER — FENTANYL CITRATE (PF) 100 MCG/2ML IJ SOLN
INTRAMUSCULAR | Status: AC
  Filled 2016-07-16: qty 2

## 2016-07-16 MED ORDER — SCOPOLAMINE 1 MG/3DAYS TD PT72: 1.0000 | MEDICATED_PATCH | Freq: Once | TRANSDERMAL | Status: DC | PRN

## 2016-07-16 MED ORDER — PROPOFOL 10 MG/ML IV BOLUS
INTRAVENOUS | Status: DC | PRN
  Administered 2016-07-16: 30 mg via INTRAVENOUS
  Administered 2016-07-16: 250 mg via INTRAVENOUS

## 2016-07-16 MED ORDER — KETOROLAC TROMETHAMINE 30 MG/ML IJ SOLN
INTRAMUSCULAR | Status: AC
  Filled 2016-07-16: qty 1

## 2016-07-16 MED ORDER — PROPOFOL 10 MG/ML IV BOLUS
INTRAVENOUS | Status: AC
  Filled 2016-07-16: qty 20

## 2016-07-16 MED ORDER — GLYCOPYRROLATE 0.2 MG/ML IJ SOLN: 0.2000 mg | Freq: Once | INTRAMUSCULAR | Status: DC | PRN

## 2016-07-16 MED ORDER — CEFAZOLIN SODIUM-DEXTROSE 2-4 GM/100ML-% IV SOLN
INTRAVENOUS | Status: AC
  Filled 2016-07-16: qty 100

## 2016-07-16 MED ORDER — ONDANSETRON HCL 4 MG/2ML IJ SOLN
INTRAMUSCULAR | Status: DC | PRN
  Administered 2016-07-16: 4 mg via INTRAVENOUS

## 2016-07-16 MED ORDER — MIDAZOLAM HCL 2 MG/2ML IJ SOLN
1.0000 mg | INTRAMUSCULAR | Status: DC | PRN
  Administered 2016-07-16 (×2): 2 mg via INTRAVENOUS

## 2016-07-16 MED ORDER — OXYCODONE HCL 5 MG PO TABS: 10.0000 mg | ORAL_TABLET | ORAL | 0 refills | Status: DC | PRN

## 2016-07-16 MED ORDER — PROMETHAZINE HCL 25 MG/ML IJ SOLN: 6.2500 mg | INTRAMUSCULAR | Status: DC | PRN

## 2016-07-16 MED ORDER — ONDANSETRON HCL 4 MG/2ML IJ SOLN
INTRAMUSCULAR | Status: AC
  Filled 2016-07-16: qty 2

## 2016-07-16 MED ORDER — LIDOCAINE HCL (CARDIAC) 20 MG/ML IV SOLN
INTRAVENOUS | Status: DC | PRN
  Administered 2016-07-16: 50 mg via INTRAVENOUS

## 2016-07-16 MED ORDER — MIDAZOLAM HCL 2 MG/2ML IJ SOLN
INTRAMUSCULAR | Status: AC
  Filled 2016-07-16: qty 2

## 2016-07-16 MED ORDER — HYDROCODONE-ACETAMINOPHEN 7.5-325 MG PO TABS: 1.0000 | ORAL_TABLET | Freq: Once | ORAL | Status: DC | PRN

## 2016-07-16 MED ORDER — FENTANYL CITRATE (PF) 100 MCG/2ML IJ SOLN
50.0000 ug | INTRAMUSCULAR | Status: DC | PRN
  Administered 2016-07-16 (×2): 50 ug via INTRAVENOUS

## 2016-07-16 MED ORDER — LIDOCAINE 2% (20 MG/ML) 5 ML SYRINGE
INTRAMUSCULAR | Status: AC
  Filled 2016-07-16: qty 5

## 2016-07-16 MED ORDER — HYDROMORPHONE HCL 1 MG/ML IJ SOLN
0.2500 mg | INTRAMUSCULAR | Status: DC | PRN
  Administered 2016-07-16 (×2): 0.25 mg via INTRAVENOUS

## 2016-07-16 MED ORDER — BUPIVACAINE HCL (PF) 0.25 % IJ SOLN
INTRAMUSCULAR | Status: DC | PRN
  Administered 2016-07-16: 9 mL

## 2016-07-16 MED ORDER — CEFAZOLIN SODIUM-DEXTROSE 2-4 GM/100ML-% IV SOLN
2.0000 g | INTRAVENOUS | Status: AC
  Administered 2016-07-16: 2 g via INTRAVENOUS

## 2016-07-16 MED ORDER — CHLORHEXIDINE GLUCONATE 4 % EX LIQD: 60.0000 mL | Freq: Once | CUTANEOUS | Status: DC

## 2016-07-16 MED ORDER — DEXAMETHASONE SODIUM PHOSPHATE 10 MG/ML IJ SOLN
INTRAMUSCULAR | Status: AC
  Filled 2016-07-16: qty 1

## 2016-07-16 MED ORDER — HYDROMORPHONE HCL 1 MG/ML IJ SOLN
INTRAMUSCULAR | Status: AC
  Filled 2016-07-16: qty 1

## 2016-07-16 MED ORDER — LACTATED RINGERS IV SOLN
INTRAVENOUS | Status: DC
  Administered 2016-07-16 (×2): via INTRAVENOUS

## 2016-07-16 SURGICAL SUPPLY — 50 items
BANDAGE ACE 3X5.8 VEL STRL LF (GAUZE/BANDAGES/DRESSINGS) ×2 IMPLANT
BLADE SURG 15 STRL LF DISP TIS (BLADE) ×2 IMPLANT
BLADE SURG 15 STRL SS (BLADE) ×2
BNDG COHESIVE 3X5 TAN STRL LF (GAUZE/BANDAGES/DRESSINGS) IMPLANT
BNDG CONFORM 3 STRL LF (GAUZE/BANDAGES/DRESSINGS) ×2 IMPLANT
BNDG GAUZE ELAST 4 BULKY (GAUZE/BANDAGES/DRESSINGS) IMPLANT
BRUSH SCRUB EZ PLAIN DRY (MISCELLANEOUS) ×2 IMPLANT
CORDS BIPOLAR (ELECTRODE) ×2 IMPLANT
COVER BACK TABLE 60X90IN (DRAPES) ×2 IMPLANT
DECANTER SPIKE VIAL GLASS SM (MISCELLANEOUS) IMPLANT
DRAPE EXTREMITY T 121X128X90 (DRAPE) ×2 IMPLANT
DRAPE SURG 17X23 STRL (DRAPES) ×2 IMPLANT
DRSG EMULSION OIL 3X3 NADH (GAUZE/BANDAGES/DRESSINGS) ×2 IMPLANT
GAUZE SPONGE 4X4 16PLY XRAY LF (GAUZE/BANDAGES/DRESSINGS) IMPLANT
GLOVE BIO SURGEON STRL SZ8 (GLOVE) ×2 IMPLANT
GLOVE BIOGEL M STRL SZ7.5 (GLOVE) IMPLANT
GLOVE BIOGEL PI IND STRL 7.0 (GLOVE) ×2 IMPLANT
GLOVE BIOGEL PI INDICATOR 7.0 (GLOVE) ×2
GLOVE ECLIPSE 6.5 STRL STRAW (GLOVE) ×2 IMPLANT
GLOVE SS BIOGEL STRL SZ 8 (GLOVE) ×1 IMPLANT
GLOVE SUPERSENSE BIOGEL SZ 8 (GLOVE) ×1
GOWN STRL REUS W/ TWL LRG LVL3 (GOWN DISPOSABLE) ×1 IMPLANT
GOWN STRL REUS W/ TWL XL LVL3 (GOWN DISPOSABLE) ×1 IMPLANT
GOWN STRL REUS W/TWL LRG LVL3 (GOWN DISPOSABLE) ×1
GOWN STRL REUS W/TWL XL LVL3 (GOWN DISPOSABLE) ×1
LOOP VESSEL MAXI BLUE (MISCELLANEOUS) IMPLANT
NEEDLE HYPO 22GX1.5 SAFETY (NEEDLE) IMPLANT
NEEDLE HYPO 25X1 1.5 SAFETY (NEEDLE) ×2 IMPLANT
NS IRRIG 1000ML POUR BTL (IV SOLUTION) ×2 IMPLANT
PACK BASIN DAY SURGERY FS (CUSTOM PROCEDURE TRAY) ×2 IMPLANT
PAD ALCOHOL SWAB (MISCELLANEOUS) ×8 IMPLANT
PAD CAST 3X4 CTTN HI CHSV (CAST SUPPLIES) ×2 IMPLANT
PADDING CAST ABS 3INX4YD NS (CAST SUPPLIES) ×1
PADDING CAST ABS 4INX4YD NS (CAST SUPPLIES) ×1
PADDING CAST ABS COTTON 3X4 (CAST SUPPLIES) ×1 IMPLANT
PADDING CAST ABS COTTON 4X4 ST (CAST SUPPLIES) ×1 IMPLANT
PADDING CAST COTTON 3X4 STRL (CAST SUPPLIES) ×2
SHEET MEDIUM DRAPE 40X70 STRL (DRAPES) ×2 IMPLANT
SPLINT FIBERGLASS 3X35 (CAST SUPPLIES) ×2 IMPLANT
SPLINT PLASTER CAST XFAST 3X15 (CAST SUPPLIES) IMPLANT
SPLINT PLASTER CAST XFAST 4X15 (CAST SUPPLIES) IMPLANT
SPLINT PLASTER XTRA FAST SET 4 (CAST SUPPLIES)
SPLINT PLASTER XTRA FASTSET 3X (CAST SUPPLIES)
STOCKINETTE 4X48 STRL (DRAPES) ×2 IMPLANT
STRIP CLOSURE SKIN 1/2X4 (GAUZE/BANDAGES/DRESSINGS) IMPLANT
SUT PROLENE 4 0 PS 2 18 (SUTURE) ×2 IMPLANT
SYR BULB 3OZ (MISCELLANEOUS) ×2 IMPLANT
SYR CONTROL 10ML LL (SYRINGE) ×2 IMPLANT
TOWEL OR 17X24 6PK STRL BLUE (TOWEL DISPOSABLE) ×2 IMPLANT
UNDERPAD 30X30 (UNDERPADS AND DIAPERS) ×2 IMPLANT

## 2016-07-16 NOTE — Transfer of Care (Signed)
Immediate Anesthesia Transfer of Care Note  Patient: Jamie Coleman  Procedure(s) Performed: Procedure(s): PIN REMOVAL OF LEFT WRIST X 2 (Left)  Patient Location: PACU  Anesthesia Type:General  Level of Consciousness: awake, alert , oriented and patient cooperative  Airway & Oxygen Therapy: Patient Spontanous Breathing and Patient connected to face mask oxygen  Post-op Assessment: Report given to RN, Post -op Vital signs reviewed and stable and Patient moving all extremities  Post vital signs: Reviewed and stable  Last Vitals:  Vitals:   07/16/16 1337  BP: (!) 151/89  Pulse: 73  Resp: 20  Temp: 37.1 C    Last Pain:  Vitals:   07/16/16 1337  TempSrc: Oral         Complications: No apparent anesthesia complications

## 2016-07-16 NOTE — Anesthesia Preprocedure Evaluation (Addendum)
Anesthesia Evaluation  Patient identified by MRN, date of birth, ID band Patient awake    Reviewed: Allergy & Precautions, NPO status , Patient's Chart, lab work & pertinent test results  Airway Mallampati: III  TM Distance: <3 FB   Mouth opening: Limited Mouth Opening  Dental  (+) Teeth Intact   Pulmonary former smoker,    breath sounds clear to auscultation       Cardiovascular negative cardio ROS   Rhythm:Regular Rate:Normal     Neuro/Psych PSYCHIATRIC DISORDERS Anxiety negative neurological ROS     GI/Hepatic Neg liver ROS, GERD  Medicated,  Endo/Other  negative endocrine ROS  Renal/GU negative Renal ROS  negative genitourinary   Musculoskeletal  (+) Arthritis ,   Abdominal   Peds negative pediatric ROS (+)  Hematology negative hematology ROS (+)   Anesthesia Other Findings   Reproductive/Obstetrics negative OB ROS                             Lab Results  Component Value Date   WBC 5.6 08/14/2015   HGB 13.3 08/14/2015   HCT 40.4 08/14/2015   MCV 96.4 08/14/2015   PLT 282 08/14/2015   Lab Results  Component Value Date   INR 1.0 10/16/2011     Anesthesia Physical  Anesthesia Plan  ASA: II  Anesthesia Plan: General   Post-op Pain Management:    Induction: Intravenous  Airway Management Planned: LMA  Additional Equipment:   Intra-op Plan:   Post-operative Plan: Extubation in OR  Informed Consent: I have reviewed the patients History and Physical, chart, labs and discussed the procedure including the risks, benefits and alternatives for the proposed anesthesia with the patient or authorized representative who has indicated his/her understanding and acceptance.   Dental advisory given  Plan Discussed with: CRNA  Anesthesia Plan Comments:       Anesthesia Quick Evaluation

## 2016-07-16 NOTE — Op Note (Signed)
See [id number] Amedeo Plenty MD

## 2016-07-16 NOTE — H&P (Signed)
Jamie Coleman is an 45 y.o. female.   Chief Complaint: retained hardware left wrist HPI: Patient presents for left wrist hardware removal  Past Medical History:  Diagnosis Date  . Allergy    allergic rhinitis  . Anxiety    situational  . Difficult intubation    states 2012 ablation surgery, had diff intubation,glide scope used, not noted in EPIC anesthesia record  . Elevated hemoglobin A1c 08/08/15  . Elevated liver function tests 08/08/15  . GERD (gastroesophageal reflux disease)   . Hyperlipidemia   . Low serum vitamin D 08/08/15  . Status post endometrial ablation   . Tennis elbow    currently in PT-10/16    Past Surgical History:  Procedure Laterality Date  . CESAREAN SECTION    . ENDOMETRIAL ABLATION     8/12  . FOOT SURGERY    . LAPAROSCOPIC TUBAL LIGATION  06/09/2011   Procedure: LAPAROSCOPIC TUBAL LIGATION;  Surgeon: Arloa Koh;  Location: [facility];  Service: Gynecology;  Laterality: N/A;  . LIGAMENT REPAIR Left 05/08/2016   Procedure: LEFT WRIST SCAPHOLUNATE LIGAMENT RECONSTRUCTION WITH TENDON GRAFT PIN NEURECTOMY AND REPAIR;  Surgeon: Roseanne Kaufman, MD;  Location: [facility];  Service: Orthopedics;  Laterality: Left;  . TONSILLECTOMY     as child  . TUBAL LIGATION  8/12    Family History  Problem Relation Age of Onset  . Cancer Mother     CA insitu of appendix  . Heart disease Father     CAD  . Diabetes Father     type II  . Diabetes Brother     type II  . Diabetes Maternal Grandfather   . Diabetes Paternal Grandmother   . Diabetes Paternal Grandfather    Social History:  reports that she quit smoking about 33 years ago. She has never used smokeless tobacco. She reports that she drinks about 6.0 oz of alcohol per week . She reports that she does not use drugs.  Allergies:  Allergies  Allergen Reactions  . Lipitor [Atorvastatin]     myalgias    No prescriptions prior to admission.    No results found for this or any previous  visit (from the past 48 hour(s)). No results found.  Review of Systems  Constitutional: Negative.   HENT: Negative.   Respiratory: Negative.   Cardiovascular: Negative.   Gastrointestinal: Negative.     Height 5\' 2"  (1.575 m), weight 83.5 kg (184 lb). Physical Exam left wrist hardware present with general stiffness and minimal soft tissue swelling  The patient is alert and oriented in no acute distress. The patient complains of pain in the affected upper extremity.  The patient is noted to have a normal HEENT exam. Lung fields show equal chest expansion and no shortness of breath. Abdomen exam is nontender without distention. Lower extremity examination does not show any fracture dislocation or blood clot symptoms. Pelvis is stable and the neck and back are stable and nontender.  Assessment/Plan We are planning surgery for your upper extremity. The risk and benefits of surgery to include risk of bleeding, infection, anesthesia,  damage to normal structures and failure of the surgery to accomplish its intended goals of relieving symptoms and restoring function have been discussed in detail. With this in mind we plan to proceed. I have specifically discussed with the patient the pre-and postoperative regime and the dos and don'ts and risk and benefits in great detail. Risk and benefits of surgery also include risk of dystrophy(CRPS), chronic  nerve pain, failure of the healing process to go onto completion and other inherent risks of surgery The relavent the pathophysiology of the disease/injury process, as well as the alternatives for treatment and postoperative course of action has been discussed in great detail with the patient who desires to proceed.  We will do everything in our power to help you (the patient) restore function to the upper extremity. It is a pleasure to see this patient today.   Paulene Floor, MD 07/16/2016, 6:45 AM

## 2016-07-16 NOTE — Anesthesia Postprocedure Evaluation (Signed)
Anesthesia Post Note  Patient: Tomeeka Bortz  Procedure(s) Performed: Procedure(s) (LRB): PIN REMOVAL OF LEFT WRIST X 2 (Left)  Patient location during evaluation: PACU Anesthesia Type: General Level of consciousness: awake and alert Pain management: pain level controlled Vital Signs Assessment: post-procedure vital signs reviewed and stable Respiratory status: spontaneous breathing, nonlabored ventilation, respiratory function stable and patient connected to nasal cannula oxygen Cardiovascular status: blood pressure returned to baseline and stable Postop Assessment: no signs of nausea or vomiting Anesthetic complications: no    Last Vitals:  Vitals:   07/16/16 1615 07/16/16 1656  BP: 122/74   Pulse: 64 72  Resp: 12 18  Temp:  36.7 C    Last Pain:  Vitals:   07/16/16 1656  TempSrc:   PainSc: 0-No pain                 Tiajuana Amass

## 2016-07-16 NOTE — Anesthesia Procedure Notes (Signed)
Procedure Name: LMA Insertion Performed by: Satya Bohall, Jessamine W Pre-anesthesia Checklist: Patient identified, Emergency Drugs available, Suction available and Patient being monitored Patient Re-evaluated:Patient Re-evaluated prior to inductionOxygen Delivery Method: Circle system utilized Preoxygenation: Pre-oxygenation with 100% oxygen Intubation Type: IV induction Ventilation: Mask ventilation without difficulty LMA: LMA inserted LMA Size: 4.0 Number of attempts: 1 Placement Confirmation: positive ETCO2 Tube secured with: Tape Dental Injury: Teeth and Oropharynx as per pre-operative assessment        

## 2016-07-16 NOTE — Discharge Instructions (Signed)
Keep bandage clean and dry.  Call for any problems.  No smoking.  Criteria for driving a car: you should be off your pain medicine for 7-8 hours, able to drive one handed(confident), thinking clearly and feeling able in your judgement to drive. °Continue elevation as it will decrease swelling.  If instructed by MD move your fingers within the confines of the bandage/splint.  Use ice if instructed by your MD. Call immediately for any sudden loss of feeling in your hand/arm or change in functional abilities of the extremity.We recommend that you to take vitamin C 1000 mg a day to promote healing. °We also recommend that if you require  pain medicine that you take a stool softener to prevent constipation as most pain medicines will have constipation side effects. We recommend either Peri-Colace or Senokot and recommend that you also consider adding MiraLAX as well to prevent the constipation affects from pain medicine if you are required to use them. These medicines are over the counter and may be purchased at a local pharmacy. A cup of yogurt and a probiotic can also be helpful during the recovery process as the medicines can disrupt your intestinal environment. ° ° ° °Post Anesthesia Home Care Instructions ° °Activity: °Get plenty of rest for the remainder of the day. A responsible adult should stay with you for 24 hours following the procedure.  °For the next 24 hours, DO NOT: °-Drive a car °-Operate machinery °-Drink alcoholic beverages °-Take any medication unless instructed by your physician °-Make any legal decisions or sign important papers. ° °Meals: °Start with liquid foods such as gelatin or soup. Progress to regular foods as tolerated. Avoid greasy, spicy, heavy foods. If nausea and/or vomiting occur, drink only clear liquids until the nausea and/or vomiting subsides. Call your physician if vomiting continues. ° °Special Instructions/Symptoms: °Your throat may feel dry or sore from the anesthesia or the  breathing tube placed in your throat during surgery. If this causes discomfort, gargle with warm salt water. The discomfort should disappear within 24 hours. ° °If you had a scopolamine patch placed behind your ear for the management of post- operative nausea and/or vomiting: ° °1. The medication in the patch is effective for 72 hours, after which it should be removed.  Wrap patch in a tissue and discard in the trash. Wash hands thoroughly with soap and water. °2. You may remove the patch earlier than 72 hours if you experience unpleasant side effects which may include dry mouth, dizziness or visual disturbances. °3. Avoid touching the patch. Wash your hands with soap and water after contact with the patch. °  ° °

## 2016-07-17 ENCOUNTER — Encounter (HOSPITAL_BASED_OUTPATIENT_CLINIC_OR_DEPARTMENT_OTHER): Payer: Self-pay | Admitting: Orthopedic Surgery

## 2016-07-17 NOTE — Op Note (Signed)
NAMETAYANA, BADOUR             ACCOUNT NO.:  [id number]  MEDICAL RECORD NO.:  [id number]  LOCATION:  [facility]                        FACILITY:  [facility]  PHYSICIAN:  Satira Anis. Essa Malachi, M.D.DATE OF BIRTH:  1971/04/17  DATE OF PROCEDURE: DATE OF DISCHARGE:                              OPERATIVE REPORT   PREOPERATIVE DIAGNOSIS:  Retained hardware, left wrist, plan for removal and fluoro exam.  POSTOPERATIVE DIAGNOSIS:  Retained hardware, left wrist, plan for removal and fluoro exam.  PROCEDURES: 1. Deep hardware removal, left wrist. 2. Fluoro exam, left wrist.  SURGEON:  Satira Anis. Amedeo Plenty, M.D.  ASSISTANT:  None.  COMPLICATIONS:  None.  ANESTHESIA:  General.  TOURNIQUET TIME:  Less than 10 minutes.  INDICATIONS:  A 45 year old female, status post scapholunate interosseous ligament reconstruction, who presents for deep hardware removal.  I have counseled in regard to risks and benefits of surgery and she desires to proceed with the above-mentioned operative intervention.  OPERATION IN DETAIL:  The patient was seen by myself and Anesthesia, taken to the operative theater, underwent smooth induction of anesthesia in the form of a general anesthetic.  Following this, she was prepped and draped in usual sterile fashion with Betadine scrub and paint.  Once this was complete, we made a small incision over the dorsal radial wrist.  Dissection was carried down too deep, Kirschner wires were removed off the periosteal surface without difficulty.  Following this, fluoroscopy exam confirmed excellent position of the bony alignment. The scapholunate interosseous ligament reconstruction looked excellent. There were no complicating features.  Following this, we then very carefully and cautiously irrigated and closed the wound after a brief tenolysis of the first dorsal compartment.  The patient tolerated the procedure well.  There were no complicating features.  All sponge, needle and  instrument counts were reported as correct.  The patient will be monitored in the recovery room.  We will see her back in the office in 8-10 days for suture removal.  The wound was closed with 4- 0 Prolene without difficulty.  She was given oxycodone p.r.n. pain.     Satira Anis. Amedeo Plenty, M.D.     [facility]  D:  07/16/2016  T:  07/17/2016  Job:  [id number]

## 2016-07-20 ENCOUNTER — Inpatient Hospital Stay (HOSPITAL_COMMUNITY): Admission: RE | Admit: 2016-07-20 | Payer: 59 | Source: Ambulatory Visit

## 2016-07-27 DIAGNOSIS — Z4789 Encounter for other orthopedic aftercare: Secondary | ICD-10-CM | POA: Diagnosis not present

## 2016-07-27 DIAGNOSIS — M25532 Pain in left wrist: Secondary | ICD-10-CM | POA: Diagnosis not present

## 2016-08-04 ENCOUNTER — Encounter: Payer: Self-pay | Admitting: Family Medicine

## 2016-08-04 ENCOUNTER — Ambulatory Visit (INDEPENDENT_AMBULATORY_CARE_PROVIDER_SITE_OTHER): Payer: 59 | Admitting: Family Medicine

## 2016-08-04 VITALS — BP 130/80 | HR 113 | Temp 98.8°F | Ht 64.5 in | Wt 186.5 lb

## 2016-08-04 DIAGNOSIS — H5702 Anisocoria: Secondary | ICD-10-CM

## 2016-08-04 NOTE — Patient Instructions (Signed)
Hold the latisse  Stick to the lubricant drops  If you develop eye pain /vision change or headache please let me know  Stop at check out for opth referral

## 2016-08-04 NOTE — Progress Notes (Signed)
Subjective:    Patient ID: Jamie Coleman, female    DOB: 1971/09/28, 45 y.o.   MRN: [id number]  HPI Here for eye problem Her pupils are different sizes (L pupil is larger)  This happened before in the spring and it got better at that time    No change in vision No eye pain or redness or swelling  Hx of dry eyes - years - uses refresh art tears  Also soothe xp since June (emolient eye drops)-white in color   She was using latisse also - started in April (every other day) - but the first time her pupil change happened was before that she thinks   No head injury or headaches   Had 2 surgeries recently  gen anesthesia   No narcotic medicines now  Last dose post op - perhaps oct 5th   No facial droop / no speech change  No weakness   Patient Active Problem List   Diagnosis Date Noted  . Pupil asymmetry 08/04/2016  . Scapholunate dissociation of left wrist 05/08/2016  . Epicondylitis, lateral 04/09/2015  . Hemorrhoids 12/28/2014  . Hyperglycemia 08/28/2014  . Rash and nonspecific skin eruption 08/26/2014  . Acute sinusitis 04/17/2014  . Exercise intolerance 08/07/2013  . Tremor 06/30/2013  . Mouth pain 03/01/2013  . Sebaceous cyst 02/15/2013  . Actinic keratosis of right cheek 02/15/2013  . Tingling 06/24/2012  . Tachycardia 01/30/2011  . Dizziness 01/30/2011  . OTHER&UNSPECIFIED DISEASES THE ORAL SOFT TISSUES 07/25/2008  . DERMATOFIBROMA, ARM 11/11/2007  . NEOPLASM, SKIN, UNCERTAIN BEHAVIOR 99991111  . Hyperlipidemia 02/10/2007  . ANXIETY, SITUATIONAL 02/10/2007  . ALLERGIC RHINITIS 02/10/2007  . GERD 02/10/2007  . IBS 02/10/2007  . POSTPARTUM DEPRESSION 02/10/2007   Past Medical History:  Diagnosis Date  . Allergy    allergic rhinitis  . Anxiety    situational  . Difficult intubation    states 2012 ablation surgery, had diff intubation,glide scope used, not noted in EPIC anesthesia record  . Elevated hemoglobin A1c 08/08/15  . Elevated liver function  tests 08/08/15  . GERD (gastroesophageal reflux disease)   . Hyperlipidemia   . Low serum vitamin D 08/08/15  . Status post endometrial ablation   . Tennis elbow    currently in PT-10/16   Past Surgical History:  Procedure Laterality Date  . CESAREAN SECTION    . ENDOMETRIAL ABLATION     8/12  . EXTREMITY WIRE/PIN REMOVAL Left 07/16/2016   Procedure: PIN REMOVAL OF LEFT WRIST X 2;  Surgeon: Roseanne Kaufman, MD;  Location: [facility];  Service: Orthopedics;  Laterality: Left;  . FOOT SURGERY    . LAPAROSCOPIC TUBAL LIGATION  06/09/2011   Procedure: LAPAROSCOPIC TUBAL LIGATION;  Surgeon: Arloa Koh;  Location: [facility];  Service: Gynecology;  Laterality: N/A;  . LIGAMENT REPAIR Left 05/08/2016   Procedure: LEFT WRIST SCAPHOLUNATE LIGAMENT RECONSTRUCTION WITH TENDON GRAFT PIN NEURECTOMY AND REPAIR;  Surgeon: Roseanne Kaufman, MD;  Location: [facility];  Service: Orthopedics;  Laterality: Left;  . TONSILLECTOMY     as child  . TUBAL LIGATION  8/12   Social History  Substance Use Topics  . Smoking status: Former Smoker    Quit date: 07/25/1983  . Smokeless tobacco: Never Used     Comment: in high school  . Alcohol use 6.0 oz/week    10 Standard drinks or equivalent per week     Comment: 7-10 drinks a week (alcohol, wine, or beer)/ daily  Family History  Problem Relation Age of Onset  . Cancer Mother     CA insitu of appendix  . Heart disease Father     CAD  . Diabetes Father     type II  . Diabetes Brother     type II  . Diabetes Maternal Grandfather   . Diabetes Paternal Grandmother   . Diabetes Paternal Grandfather    Allergies  Allergen Reactions  . Lipitor [Atorvastatin]     myalgias   Current Outpatient Prescriptions on File Prior to Visit  Medication Sig Dispense Refill  . ALPRAZolam (XANAX) 0.5 MG tablet Take 1 tablet (0.5 mg total) by mouth 2 (two) times daily as needed for anxiety. 30 tablet 3  . Calcium Carbonate-Vit D-Min 600-400  MG-UNIT TABS Take 1 tablet by mouth 2 (two) times daily.     . cetirizine (ZYRTEC) 10 MG tablet Take 10 mg by mouth daily as needed for allergies.     . hydrocortisone (ANUSOL-HC) 25 MG suppository Place 1 suppository (25 mg total) rectally 2 (two) times daily. 12 suppository 1  . ibuprofen (ADVIL,MOTRIN) 800 MG tablet Take 1 tablet (800 mg total) by mouth every 8 (eight) hours as needed (with food). 90 tablet 2  . Multiple Vitamin (MULTIVITAMIN) capsule Take 1 capsule by mouth daily.      . Omega-3 Fatty Acids (FISH OIL PO) Take by mouth daily.      . pantoprazole (PROTONIX) 40 MG tablet Take 1 tablet (40 mg total) by mouth daily as needed. 30 tablet 11   No current facility-administered medications on file prior to visit.     Review of Systems Review of Systems  Constitutional: Negative for fever, appetite change, fatigue and unexpected weight change.  Eyes: Negative for pain and visual disturbance.  Respiratory: Negative for cough and shortness of breath.   Cardiovascular: Negative for cp or palpitations    Gastrointestinal: Negative for nausea, diarrhea and constipation.  Genitourinary: Negative for urgency and frequency.  Skin: Negative for pallor or rash   Neurological: Negative for weakness, light-headedness, numbness and headaches.  Hematological: Negative for adenopathy. Does not bruise/bleed easily.  Psychiatric/Behavioral: Negative for dysphoric mood. The patient is not nervous/anxious.         Objective:   Physical Exam  Constitutional: She appears well-developed and well-nourished. No distress.  obese and well appearing   HENT:  Head: Normocephalic and atraumatic.  Right Ear: External ear normal.  Left Ear: External ear normal.  Nose: Nose normal.  Mouth/Throat: Oropharynx is clear and moist. No oropharyngeal exudate.  No facial asymmetry   Eyes: Conjunctivae and EOM are normal. Pupils are equal, round, and reactive to light. Right eye exhibits no discharge. Left eye  exhibits no discharge. No scleral icterus.  R pupil is approx 2 mm smaller than the left Nl appearing iris and sclera  Both pupils are reactive Grossly nl fundoscopy  Nl EOMs bilaterally     Neck: Normal range of motion. Neck supple. No JVD present. Carotid bruit is not present. No thyromegaly present.  Cardiovascular: Normal rate, regular rhythm, normal heart sounds and intact distal pulses.  Exam reveals no gallop.   Pulmonary/Chest: Effort normal and breath sounds normal. No respiratory distress. She has no wheezes. She has no rales.  No crackles  Abdominal: Soft. Bowel sounds are normal. She exhibits no distension, no abdominal bruit and no mass. There is no tenderness.  Musculoskeletal: She exhibits no edema.  Lymphadenopathy:    She has no cervical adenopathy.  Neurological: She is alert. She has normal reflexes. She displays no atrophy. No cranial nerve deficit or sensory deficit. She exhibits normal muscle tone. Coordination and gait normal.  Skin: Skin is warm and dry. No rash noted.  Psychiatric: She has a normal mood and affect.          Assessment & Plan:   Problem List Items Addressed This Visit      Other   Pupil asymmetry    New and mild/possibly recurrent with L pupil larger in the L than the R No pharmacologic explanation  Suspect anisocoria Ref to opthalmology for further eval  Will watch for eye pain and vision change or ha or other neuro symptoms  Will hold her latisse - doubtful contributing She does have dry eyes and uses emolient drops       Relevant Orders   Ambulatory referral to Ophthalmology    Other Visit Diagnoses   None.

## 2016-08-04 NOTE — Assessment & Plan Note (Signed)
New and mild/possibly recurrent with L pupil larger in the L than the R No pharmacologic explanation  Suspect anisocoria Ref to opthalmology for further eval  Will watch for eye pain and vision change or ha or other neuro symptoms  Will hold her latisse - doubtful contributing She does have dry eyes and uses emolient drops

## 2016-08-04 NOTE — Progress Notes (Signed)
Pre visit review using our clinic review tool, if applicable. No additional management support is needed unless otherwise documented below in the visit note. 

## 2016-08-05 DIAGNOSIS — M25532 Pain in left wrist: Secondary | ICD-10-CM | POA: Diagnosis not present

## 2016-08-10 DIAGNOSIS — M25532 Pain in left wrist: Secondary | ICD-10-CM | POA: Diagnosis not present

## 2016-08-11 ENCOUNTER — Other Ambulatory Visit (HOSPITAL_COMMUNITY): Payer: Self-pay | Admitting: Surgery

## 2016-08-11 DIAGNOSIS — G902 Horner's syndrome: Secondary | ICD-10-CM | POA: Diagnosis not present

## 2016-08-11 DIAGNOSIS — H04123 Dry eye syndrome of bilateral lacrimal glands: Secondary | ICD-10-CM | POA: Diagnosis not present

## 2016-08-11 DIAGNOSIS — H5702 Anisocoria: Secondary | ICD-10-CM | POA: Diagnosis not present

## 2016-08-11 DIAGNOSIS — E119 Type 2 diabetes mellitus without complications: Secondary | ICD-10-CM | POA: Diagnosis not present

## 2016-08-12 ENCOUNTER — Other Ambulatory Visit (HOSPITAL_COMMUNITY): Payer: Self-pay | Admitting: Surgery

## 2016-08-12 DIAGNOSIS — G902 Horner's syndrome: Secondary | ICD-10-CM

## 2016-08-13 LAB — HM PAP SMEAR

## 2016-08-17 ENCOUNTER — Ambulatory Visit (HOSPITAL_COMMUNITY): Payer: 59

## 2016-08-17 ENCOUNTER — Ambulatory Visit (HOSPITAL_COMMUNITY)
Admission: RE | Admit: 2016-08-17 | Discharge: 2016-08-17 | Disposition: A | Discharge status: Home / Self Care | Payer: 59 | Source: Ambulatory Visit | Attending: Surgery | Admitting: Surgery

## 2016-08-17 ENCOUNTER — Other Ambulatory Visit (HOSPITAL_COMMUNITY): Payer: Self-pay | Admitting: Surgery

## 2016-08-17 ENCOUNTER — Ambulatory Visit (HOSPITAL_COMMUNITY): Admission: RE | Admit: 2016-08-17 | Payer: 59 | Source: Ambulatory Visit

## 2016-08-17 DIAGNOSIS — M25532 Pain in left wrist: Secondary | ICD-10-CM | POA: Diagnosis not present

## 2016-08-17 DIAGNOSIS — G902 Horner's syndrome: Secondary | ICD-10-CM

## 2016-08-17 DIAGNOSIS — H5702 Anisocoria: Secondary | ICD-10-CM | POA: Diagnosis not present

## 2016-08-17 DIAGNOSIS — R918 Other nonspecific abnormal finding of lung field: Secondary | ICD-10-CM | POA: Diagnosis not present

## 2016-08-17 MED ORDER — GADOBENATE DIMEGLUMINE 529 MG/ML IV SOLN
20.0000 mL | Freq: Once | INTRAVENOUS | Status: AC | PRN
  Administered 2016-08-17: 18 mL via INTRAVENOUS

## 2016-08-19 ENCOUNTER — Ambulatory Visit (INDEPENDENT_AMBULATORY_CARE_PROVIDER_SITE_OTHER): Payer: 59 | Admitting: Obstetrics and Gynecology

## 2016-08-19 ENCOUNTER — Encounter: Payer: Self-pay | Admitting: Obstetrics and Gynecology

## 2016-08-19 VITALS — BP 134/82 | HR 88 | Resp 14 | Ht 62.0 in | Wt 190.4 lb

## 2016-08-19 DIAGNOSIS — Z0000 Encounter for general adult medical examination without abnormal findings: Secondary | ICD-10-CM

## 2016-08-19 DIAGNOSIS — Z01419 Encounter for gynecological examination (general) (routine) without abnormal findings: Secondary | ICD-10-CM

## 2016-08-19 LAB — POCT URINALYSIS DIPSTICK
Bilirubin, UA: NEGATIVE
Blood, UA: NEGATIVE
Glucose, UA: NEGATIVE
Ketones, UA: NEGATIVE
Leukocytes, UA: NEGATIVE
Nitrite, UA: NEGATIVE
Protein, UA: NEGATIVE
Urobilinogen, UA: NEGATIVE
pH, UA: 6

## 2016-08-19 NOTE — Progress Notes (Signed)
45 y.o. G41P1002 Married Caucasian female here for annual exam.     No spotting since wrist surgery in July 2017.  Maybe has hot flashes occasionally.   Left wrist surgery in July.  Has been out of work since then.   Having left pupil dilation, Horner's syndrome.  Normal MR of head and neck.  Seeing Neurology next.  PCP:  Loura Pardon, MD   No LMP recorded. Patient has had an ablation.           Sexually active: Yes.   female The current method of family planning is tubal ligation/Ablation.    Exercising: Yes.    walking Smoker:  Former--years ago in high school  Health Maintenance: Pap:  07-27-14 Neg:Neg HR HPV History of abnormal Pap:  Yes, colposcopy 2011 MMG:  05-18-16--See Epic. Right breast biopsy 05/20/16 - fibroadenoma with calcifications.  Colonoscopy:  n/a BMD:   n/a  Result  n/a TDaP:  UTD per patient Gardasil:   N/A   Screening Labs:  Hb today: Not drawn today, patient will return fasting, Urine today:  Neg.   reports that she quit smoking about 33 years ago. She has never used smokeless tobacco. She reports that she drinks about 6.0 oz of alcohol per week . She reports that she does not use drugs.  Past Medical History:  Diagnosis Date  . Allergy    allergic rhinitis  . Anxiety    situational  . Difficult intubation    states 2012 ablation surgery, had diff intubation,glide scope used, not noted in EPIC anesthesia record  . Elevated hemoglobin A1c 08/08/15  . Elevated liver function tests 08/08/15  . GERD (gastroesophageal reflux disease)   . Hyperlipidemia   . Low serum vitamin D 08/08/15  . Status post endometrial ablation   . Tennis elbow    currently in PT-10/16    Past Surgical History:  Procedure Laterality Date  . CESAREAN SECTION    . ENDOMETRIAL ABLATION     8/12  . EXTREMITY WIRE/PIN REMOVAL Left 07/16/2016   Procedure: PIN REMOVAL OF LEFT WRIST X 2;  Surgeon: Roseanne Kaufman, MD;  Location: [facility];  Service: Orthopedics;   Laterality: Left;  . FOOT SURGERY    . LAPAROSCOPIC TUBAL LIGATION  06/09/2011   Procedure: LAPAROSCOPIC TUBAL LIGATION;  Surgeon: Arloa Koh;  Location: [facility];  Service: Gynecology;  Laterality: N/A;  . LIGAMENT REPAIR Left 05/08/2016   Procedure: LEFT WRIST SCAPHOLUNATE LIGAMENT RECONSTRUCTION WITH TENDON GRAFT PIN NEURECTOMY AND REPAIR;  Surgeon: Roseanne Kaufman, MD;  Location: [facility];  Service: Orthopedics;  Laterality: Left;  . TONSILLECTOMY     as child  . TUBAL LIGATION  8/12    Current Outpatient Prescriptions  Medication Sig Dispense Refill  . ALPRAZolam (XANAX) 0.5 MG tablet Take 1 tablet (0.5 mg total) by mouth 2 (two) times daily as needed for anxiety. 30 tablet 3  . Calcium Carbonate-Vit D-Min 600-400 MG-UNIT TABS Take 1 tablet by mouth 2 (two) times daily.     . cetirizine (ZYRTEC) 10 MG tablet Take 10 mg by mouth daily as needed for allergies.     . Cholecalciferol (VITAMIN D3) 5000 units TABS Take 1 tablet by mouth every other day.    . hydrocortisone (ANUSOL-HC) 25 MG suppository Place 1 suppository (25 mg total) rectally 2 (two) times daily. 12 suppository 1  . ibuprofen (ADVIL,MOTRIN) 800 MG tablet Take 1 tablet (800 mg total) by mouth every 8 (eight) hours as  needed (with food). 90 tablet 2  . Multiple Vitamin (MULTIVITAMIN) capsule Take 1 capsule by mouth daily.      . Omega-3 Fatty Acids (FISH OIL PO) Take by mouth daily.      . pantoprazole (PROTONIX) 40 MG tablet Take 1 tablet (40 mg total) by mouth daily as needed. 30 tablet 11   No current facility-administered medications for this visit.     Family History  Problem Relation Age of Onset  . Cancer Mother     CA insitu of appendix  . Heart disease Father     CAD  . Diabetes Father     type II  . Diabetes Brother     type II  . Diabetes Maternal Grandfather   . Diabetes Paternal Grandmother   . Diabetes Paternal Grandfather     ROS:  Pertinent items are noted in HPI.  Otherwise, a  comprehensive ROS was negative.  Exam:   BP 134/82 (BP Location: Right Arm, Patient Position: Sitting, Cuff Size: Normal)   Pulse 88   Resp 14   Ht 5\' 2"  (1.575 m)   Wt 190 lb 6.4 oz (86.4 kg)   BMI 34.82 kg/m     General appearance: alert, cooperative and appears stated age Head: Normocephalic, without obvious abnormality, atraumatic Neck: no adenopathy, supple, symmetrical, trachea midline and thyroid normal to inspection and palpation Lungs: clear to auscultation bilaterally Breasts: normal appearance, no masses or tenderness, No nipple retraction or dimpling, No nipple discharge or bleeding, No axillary or supraclavicular adenopathy Heart: regular rate and rhythm Abdomen: soft, non-tender; no masses, no organomegaly Extremities: extremities normal, atraumatic, no cyanosis or edema.  Wrist guard on left hand. Skin: Skin color, texture, turgor normal. No rashes or lesions Lymph nodes: Cervical, supraclavicular, and axillary nodes normal. No abnormal inguinal nodes palpated Neurologic: Grossly normal  Pelvic: External genitalia:  no lesions              Urethra:  normal appearing urethra with no masses, tenderness or lesions              Bartholins and Skenes: normal                 Vagina: normal appearing vagina with normal color and discharge, no lesions              Cervix: no lesions.  Small amount of blood tinges mucous from the os.              Pap taken: No. Bimanual Exam:  Uterus:  normal size, contour, position, consistency, mobility, non-tender              Adnexa: no mass, fullness, tenderness              Rectal exam: Yes.  .  Confirms.              Anus:  normal sphincter tone, no lesions  Chaperone was present for exam.  Assessment:   Well woman visit with normal exam. Status post BTl and endometrial ablation.  Menopausal symptoms.  Looks like menses starting today.  Elevated cholesterol. Elevated LFTs.  Plan: Yearly mammogram recommended after age 13.    Due in Feb. 2018.  Recommended self breast exam.  Pap and HR HPV as above. Guidelines for Calcium, Vitamin D, regular exercise program including cardiovascular and weight bearing exercise. She will do her labs through her PCP.  I want her to receive appropriate evaluation, counseling, and tx. Follow up  annually and prn.       After visit summary provided.

## 2016-08-19 NOTE — Patient Instructions (Signed)

## 2016-08-24 DIAGNOSIS — M25532 Pain in left wrist: Secondary | ICD-10-CM | POA: Diagnosis not present

## 2016-08-24 DIAGNOSIS — Z1231 Encounter for screening mammogram for malignant neoplasm of breast: Secondary | ICD-10-CM | POA: Diagnosis not present

## 2016-08-31 DIAGNOSIS — M25532 Pain in left wrist: Secondary | ICD-10-CM | POA: Diagnosis not present

## 2016-09-07 DIAGNOSIS — R918 Other nonspecific abnormal finding of lung field: Secondary | ICD-10-CM | POA: Diagnosis not present

## 2016-09-07 DIAGNOSIS — M25532 Pain in left wrist: Secondary | ICD-10-CM | POA: Diagnosis not present

## 2016-09-14 DIAGNOSIS — M25532 Pain in left wrist: Secondary | ICD-10-CM | POA: Diagnosis not present

## 2016-09-21 DIAGNOSIS — R918 Other nonspecific abnormal finding of lung field: Secondary | ICD-10-CM | POA: Diagnosis not present

## 2016-09-21 DIAGNOSIS — M25532 Pain in left wrist: Secondary | ICD-10-CM | POA: Diagnosis not present

## 2016-09-23 MED FILL — ALPRAZolam 0.5 MG TABS: 0.5 | 15 days supply | Qty: 30 | Fill #2

## 2016-09-23 MED FILL — PANTOPRAZOLE SOD DR 40 MG T: 40 | 90 days supply | Qty: 90 | Fill #3

## 2016-09-28 DIAGNOSIS — M25532 Pain in left wrist: Secondary | ICD-10-CM | POA: Diagnosis not present

## 2016-09-28 DIAGNOSIS — R918 Other nonspecific abnormal finding of lung field: Secondary | ICD-10-CM | POA: Diagnosis not present

## 2016-09-29 ENCOUNTER — Encounter: Payer: Self-pay | Admitting: Neurology

## 2016-09-29 ENCOUNTER — Ambulatory Visit (INDEPENDENT_AMBULATORY_CARE_PROVIDER_SITE_OTHER): Payer: 59 | Admitting: Neurology

## 2016-09-29 VITALS — BP 136/78 | HR 102 | Ht 62.0 in | Wt 192.0 lb

## 2016-09-29 DIAGNOSIS — G902 Horner's syndrome: Secondary | ICD-10-CM

## 2016-09-29 NOTE — Progress Notes (Signed)
NEUROLOGY CONSULTATION NOTE  Jamie Coleman MRN: [id number] DOB: 19-Mar-1971  Referring provider: Dr. Kathlen Mody Primary care provider: Dr. Glori Bickers  Reason for consult:  Horner syndrome  HISTORY OF PRESENT ILLNESS: Jamie Coleman is a 45 year old right-handed female who presents for Horner's syndrome.  She is accompanied by her husband who supplements history.  History also is supplemented by PCP and ophthalmology notes.  During this past spring, she noticed that her left pupil appeared larger than the right one.  She didn't think much of it and it seemed to have resolved.  In October, she again developed this pupil asymmetry  She was evaluated by ophthalmology and was found to have anisocoria and right ptosis.  The phenylephrine test reversed the anisocoria and she was diagnosed with right Horner's syndrome.  She underwent workup, including MRA of head and neck and CT chest, which were personally reviewed and were unremarkable (no evidence of intracranial mass or aneurysm, carotid dissection, neck mass or chest/lung mass).   Unlike earlier in the year, the pupil asymmetry has persisted.  It has not changed and is not worse any particular time of day.  She denies associated headache, eye pain, neck pain, arm pain, diplopia, facial numbness, facial flushing, or unilateral numbness or weakness.    She has had maybe two migraines in her life, but otherwise has no history of headache.  PAST MEDICAL HISTORY: Past Medical History:  Diagnosis Date  . Allergy    allergic rhinitis  . Anxiety    situational  . Difficult intubation    states 2012 ablation surgery, had diff intubation,glide scope used, not noted in EPIC anesthesia record  . Elevated hemoglobin A1c 08/08/15  . Elevated liver function tests 08/08/15  . GERD (gastroesophageal reflux disease)   . Hyperlipidemia   . Low serum vitamin D 08/08/15  . Status post endometrial ablation   . Tennis elbow    currently in PT-10/16    PAST  SURGICAL HISTORY: Past Surgical History:  Procedure Laterality Date  . CESAREAN SECTION    . ENDOMETRIAL ABLATION     8/12  . EXTREMITY WIRE/PIN REMOVAL Left 07/16/2016   Procedure: PIN REMOVAL OF LEFT WRIST X 2;  Surgeon: Roseanne Kaufman, MD;  Location: [facility];  Service: Orthopedics;  Laterality: Left;  . FOOT SURGERY    . LAPAROSCOPIC TUBAL LIGATION  06/09/2011   Procedure: LAPAROSCOPIC TUBAL LIGATION;  Surgeon: Arloa Koh;  Location: [facility];  Service: Gynecology;  Laterality: N/A;  . LIGAMENT REPAIR Left 05/08/2016   Procedure: LEFT WRIST SCAPHOLUNATE LIGAMENT RECONSTRUCTION WITH TENDON GRAFT PIN NEURECTOMY AND REPAIR;  Surgeon: Roseanne Kaufman, MD;  Location: [facility];  Service: Orthopedics;  Laterality: Left;  . TONSILLECTOMY     as child  . TUBAL LIGATION  8/12    MEDICATIONS: Current Outpatient Prescriptions on File Prior to Visit  Medication Sig Dispense Refill  . ALPRAZolam (XANAX) 0.5 MG tablet Take 1 tablet (0.5 mg total) by mouth 2 (two) times daily as needed for anxiety. 30 tablet 3  . Calcium Carbonate-Vit D-Min 600-400 MG-UNIT TABS Take 1 tablet by mouth 2 (two) times daily.     . cetirizine (ZYRTEC) 10 MG tablet Take 10 mg by mouth daily as needed for allergies.     . Cholecalciferol (VITAMIN D3) 5000 units TABS Take 1 tablet by mouth every other day.    . hydrocortisone (ANUSOL-HC) 25 MG suppository Place 1 suppository (25 mg total) rectally 2 (two) times daily.  12 suppository 1  . ibuprofen (ADVIL,MOTRIN) 800 MG tablet Take 1 tablet (800 mg total) by mouth every 8 (eight) hours as needed (with food). 90 tablet 2  . Multiple Vitamin (MULTIVITAMIN) capsule Take 1 capsule by mouth daily.      . Omega-3 Fatty Acids (FISH OIL PO) Take by mouth daily.      . pantoprazole (PROTONIX) 40 MG tablet Take 1 tablet (40 mg total) by mouth daily as needed. 30 tablet 11   No current facility-administered medications on file prior to visit.      ALLERGIES: Allergies  Allergen Reactions  . Lipitor [Atorvastatin]     myalgias    FAMILY HISTORY: Family History  Problem Relation Age of Onset  . Cancer Mother     CA insitu of appendix  . Heart disease Father     CAD  . Diabetes Father     type II  . Diabetes Brother     type II  . Diabetes Maternal Grandfather   . Diabetes Paternal Grandmother   . Diabetes Paternal Grandfather     SOCIAL HISTORY: Social History   Social History  . Marital status: Married    Spouse name: N/A  . Number of children: N/A  . Years of education: N/A   Occupational History  . Not on file.   Social History Main Topics  . Smoking status: Former Smoker    Quit date: 07/25/1983  . Smokeless tobacco: Never Used     Comment: in high school  . Alcohol use 6.0 oz/week    10 Standard drinks or equivalent per week     Comment: 7-10 drinks a week (alcohol, wine, or beer)/ daily  . Drug use: No  . Sexual activity: Yes    Partners: Male    Birth control/ protection: Surgical     Comment: Tubal ligation   Other Topics Concern  . Not on file   Social History Narrative  . No narrative on file    REVIEW OF SYSTEMS: Constitutional: No fevers, chills, or sweats, no generalized fatigue, change in appetite Eyes: No visual changes, double vision, eye pain Ear, nose and throat: No hearing loss, ear pain, nasal congestion, sore throat Cardiovascular: No chest pain, palpitations Respiratory:  No shortness of breath at rest or with exertion, wheezes GastrointestinaI: No nausea, vomiting, diarrhea, abdominal pain, fecal incontinence Genitourinary:  No dysuria, urinary retention or frequency Musculoskeletal:  No neck pain, back pain Integumentary: No rash, pruritus, skin lesions Neurological: as above Psychiatric: No depression, insomnia, anxiety Endocrine: No palpitations, fatigue, diaphoresis, mood swings, change in appetite, change in weight, increased thirst Hematologic/Lymphatic:  No  purpura, petechiae. Allergic/Immunologic: no itchy/runny eyes, nasal congestion, recent allergic reactions, rashes  PHYSICAL EXAM: Vitals:   09/29/16 1257  BP: 136/78  Pulse: (!) 102   General: No acute distress.  Patient appears well-groomed.  Head:  Normocephalic/atraumatic Eyes:  fundi examined but not visualized Neck: supple, no paraspinal tenderness, full range of motion Back: No paraspinal tenderness Heart: regular rate and rhythm Lungs: Clear to auscultation bilaterally. Vascular: No carotid bruits. Neurological Exam: Mental status: alert and oriented to person, place, and time, recent and remote memory intact, fund of knowledge intact, attention and concentration intact, speech fluent and not dysarthric, language intact. Cranial nerves: CN I: not tested CN II: OD 3.68mm, OS 4.5 mm, round and reactive to light (although minimally reactive on the right), visual fields intact CN III, IV, VI:  full range of motion, no nystagmus, very slight  right ptosis CN V: facial sensation intact CN VII: upper and lower face symmetric CN VIII: hearing intact CN IX, X: gag intact, uvula midline CN XI: sternocleidomastoid and trapezius muscles intact CN XII: tongue midline Bulk & Tone: normal, no fasciculations. Motor:  5/5 throughout  Sensation: temperature and vibration sensation intact. Deep Tendon Reflexes:  2+ throughout, toes downgoing.  Finger to nose testing:  Without dysmetria.  Heel to shin:  Without dysmetria.  Gait:  Normal station and stride.  Able to turn and tandem walk. Romberg negative.  IMPRESSION: Partial right Horner syndrome.  In absence of a structural etiology, then would call it idiopathic.  She does not suffer from headaches, so unlikely to be migraine or other headache syndrome.  PLAN: 1.  In absence of other lateralizing abnormalities on the neurologic exam, my suspicion is low for a brainstem lesion, but I think we should check an MRI of brain with and without  contrast for definitive confirmation. 2.  We will contact her with results and whether further workup is warranted.  Otherwise, follow up as needed.  Thank you for allowing me to take part in the care of this patient.  Metta Clines, DO  CC:  Loura Pardon, MD  Marshall Cork, MD

## 2016-09-29 NOTE — Patient Instructions (Signed)
Most likely the Horner's syndrome is idiopathic.  All we can do is rule out anything structural that would cause it.  Imaging reveals no tumor, aneurysm or carotid dissection, which may be causes of this.  For sake of completeness, I want to get an MRI of the brain with and without contrast to evaluate the brainstem.  We will contact you with results and whether further workup is needed.

## 2016-10-07 ENCOUNTER — Ambulatory Visit (HOSPITAL_COMMUNITY)
Admission: RE | Admit: 2016-10-07 | Discharge: 2016-10-07 | Disposition: A | Discharge status: Home / Self Care | Payer: 59 | Source: Ambulatory Visit | Attending: Neurology | Admitting: Neurology

## 2016-10-07 ENCOUNTER — Ambulatory Visit (HOSPITAL_COMMUNITY): Payer: 59

## 2016-10-07 DIAGNOSIS — G902 Horner's syndrome: Secondary | ICD-10-CM | POA: Diagnosis not present

## 2016-10-07 DIAGNOSIS — M25532 Pain in left wrist: Secondary | ICD-10-CM | POA: Diagnosis not present

## 2016-10-07 DIAGNOSIS — Z4789 Encounter for other orthopedic aftercare: Secondary | ICD-10-CM | POA: Diagnosis not present

## 2016-10-07 MED ORDER — GADOBENATE DIMEGLUMINE 529 MG/ML IV SOLN: 18.0000 mL | Freq: Once | INTRAVENOUS | Status: DC | PRN

## 2016-10-14 DIAGNOSIS — M25532 Pain in left wrist: Secondary | ICD-10-CM | POA: Diagnosis not present

## 2016-10-21 DIAGNOSIS — M25532 Pain in left wrist: Secondary | ICD-10-CM | POA: Diagnosis not present

## 2016-10-23 ENCOUNTER — Other Ambulatory Visit: Payer: Self-pay | Admitting: Obstetrics and Gynecology

## 2016-10-23 DIAGNOSIS — Z1231 Encounter for screening mammogram for malignant neoplasm of breast: Secondary | ICD-10-CM

## 2016-10-27 MED FILL — ALPRAZolam 0.5 MG TABS: 0.5 | 15 days supply | Qty: 30 | Fill #3

## 2016-11-05 DIAGNOSIS — M25532 Pain in left wrist: Secondary | ICD-10-CM | POA: Diagnosis not present

## 2016-11-06 MED ORDER — GADOBENATE DIMEGLUMINE 529 MG/ML IV SOLN
20.0000 mL | Freq: Once | INTRAVENOUS | Status: AC | PRN
  Administered 2016-10-07: 18 mL via INTRAVENOUS

## 2016-11-06 NOTE — Addendum Note (Signed)
Encounter addended by: Nancie Neas Shelena Castelluccio on: 11/06/2016  9:27 AM<BR>    Actions taken: Imaging Exam ended, Order list changed, MAR administration accepted

## 2016-11-09 NOTE — Addendum Note (Signed)
Encounter addended by: Stevenson Clinch on: 11/09/2016  2:41 PM<BR>    Actions taken: Imaging Exam ended, MAR administration edited, MAR administration accepted

## 2016-11-12 DIAGNOSIS — M25532 Pain in left wrist: Secondary | ICD-10-CM | POA: Diagnosis not present

## 2016-11-18 DIAGNOSIS — M25532 Pain in left wrist: Secondary | ICD-10-CM | POA: Diagnosis not present

## 2016-11-25 ENCOUNTER — Ambulatory Visit
Admission: RE | Admit: 2016-11-25 | Discharge: 2016-11-25 | Disposition: A | Discharge status: Home / Self Care | Payer: 59 | Source: Ambulatory Visit | Attending: Obstetrics and Gynecology | Admitting: Obstetrics and Gynecology

## 2016-11-25 DIAGNOSIS — M25532 Pain in left wrist: Secondary | ICD-10-CM | POA: Diagnosis not present

## 2016-11-25 DIAGNOSIS — Z1231 Encounter for screening mammogram for malignant neoplasm of breast: Secondary | ICD-10-CM

## 2016-12-16 DIAGNOSIS — M25532 Pain in left wrist: Secondary | ICD-10-CM | POA: Diagnosis not present

## 2017-01-06 DIAGNOSIS — M25532 Pain in left wrist: Secondary | ICD-10-CM | POA: Diagnosis not present

## 2017-02-03 DIAGNOSIS — Z4789 Encounter for other orthopedic aftercare: Secondary | ICD-10-CM | POA: Diagnosis not present

## 2017-02-03 DIAGNOSIS — M25532 Pain in left wrist: Secondary | ICD-10-CM | POA: Diagnosis not present

## 2017-04-15 ENCOUNTER — Other Ambulatory Visit: Payer: Self-pay | Admitting: Family Medicine

## 2017-04-15 DIAGNOSIS — G902 Horner's syndrome: Secondary | ICD-10-CM | POA: Diagnosis not present

## 2017-04-15 DIAGNOSIS — H524 Presbyopia: Secondary | ICD-10-CM | POA: Diagnosis not present

## 2017-04-15 DIAGNOSIS — H5702 Anisocoria: Secondary | ICD-10-CM | POA: Diagnosis not present

## 2017-04-15 DIAGNOSIS — E119 Type 2 diabetes mellitus without complications: Secondary | ICD-10-CM | POA: Diagnosis not present

## 2017-04-15 DIAGNOSIS — H04123 Dry eye syndrome of bilateral lacrimal glands: Secondary | ICD-10-CM | POA: Diagnosis not present

## 2017-04-15 LAB — HM DIABETES EYE EXAM

## 2017-04-15 NOTE — Telephone Encounter (Signed)
Pt had an acute appt on 08/04/16, no future appts., last filled on 05/07/16 #30 tabs with 3 additional refills, please advise

## 2017-04-15 NOTE — Telephone Encounter (Signed)
Px written for call in   

## 2017-04-16 MED FILL — ALPRAZolam 0.5 MG TABS: 0.5 | 15 days supply | Qty: 30 | Fill #0

## 2017-04-16 NOTE — Telephone Encounter (Signed)
Rx called in as prescribed 

## 2017-04-30 ENCOUNTER — Telehealth: Payer: Self-pay | Admitting: Family Medicine

## 2017-04-30 NOTE — Telephone Encounter (Signed)
[facility] Patient Name: YARITZI CRAUN DOB: 1971/07/28 Initial Comment Caller states she's lower jaw pain and gum pain. Nurse Assessment Nurse: Yolanda Bonine, RN, Erin Date/Time (Eastern Time): 04/30/2017 9:51:19 AM Confirm and document reason for call. If symptomatic, describe symptoms. ---caller states under jaw pain and gums are tender. No bleeding from the gums. No fever. Does the patient have any new or worsening symptoms? ---Yes Will a triage be completed? ---Yes Related visit to physician within the last 2 weeks? ---No Does the PT have any chronic conditions? (i.e. diabetes, asthma, etc.) ---No Is the patient pregnant or possibly pregnant? (Ask all females between the ages of 81-55) ---No Is this a behavioral health or substance abuse call? ---No Guidelines Guideline Title Affirmed Question Affirmed Notes Face Pain Face pain present > 24 hours Final Disposition User See Physician within 24 Hours [place], [place], Junie Panning Comments my teeth are not sore but it's tender when I bite down on all teeth NO appts available at [facility] today. Pt states she is going to use her teleahealth option that is availalbe Referrals GO TO FACILITY REFUSED Disagree/Comply: Comply

## 2017-04-30 NOTE — Telephone Encounter (Signed)
Please check in with her Monday and see how it went/how symptoms are

## 2017-05-03 NOTE — Telephone Encounter (Signed)
Pt said she didn't end up doing the E-visit. She has been gargling with warm salt water mixed with peroxide. Pt said her pain is starting to resolve and she doesn't need an appt, pt advise if sxs don't fully resolve to let us know, pt verbalized understanding.

## 2017-06-22 MED FILL — ALPRAZolam 0.5 MG TABS: 0.5 | 15 days supply | Qty: 30 | Fill #1

## 2017-08-19 NOTE — Progress Notes (Deleted)
46 y.o. G101P1002 Married Caucasian female here for annual exam.    PCP:     No LMP recorded. Patient has had an ablation.           Sexually active: {yes no:314532}  The current method of family planning is tubal ligation/ablation.    Exercising: {yes WP:[id number]}  {types:19826} Smoker:  Former--years ago in high school  Health Maintenance: Pap: 07-27-14 Neg:Neg HR HPV, 07-25-13 Neg:Neg HR HPV History of abnormal Pap:  {YES NO:22349} MMG: 11-25-16 Density C/Neg/BiRads1:TBC Colonoscopy:  n/a BMD:   n/a  Result  n/a TDaP:  *** Gardasil:   no HIV: *** Hep C: *** Screening Labs:  Hb today: ***, Urine today: ***   reports that she quit smoking about 34 years ago. she has never used smokeless tobacco. She reports that she drinks about 6.0 oz of alcohol per week. She reports that she does not use drugs.  Past Medical History:  Diagnosis Date  . Allergy    allergic rhinitis  . Anxiety    situational  . Difficult intubation    states 2012 ablation surgery, had diff intubation,glide scope used, not noted in EPIC anesthesia record  . Elevated hemoglobin A1c 08/08/15  . Elevated liver function tests 08/08/15  . GERD (gastroesophageal reflux disease)   . Hyperlipidemia   . Low serum vitamin D 08/08/15  . Status post endometrial ablation   . Tennis elbow    currently in PT-10/16    Past Surgical History:  Procedure Laterality Date  . CESAREAN SECTION    . ENDOMETRIAL ABLATION     8/12  . FOOT SURGERY    . TONSILLECTOMY     as child  . TUBAL LIGATION  8/12    Current Outpatient Medications  Medication Sig Dispense Refill  . ALPRAZolam (XANAX) 0.5 MG tablet TAKE 1 TABLET BY MOUTH TWICE DAILY AS NEEDED FOR ANXIETY 30 tablet 3  . Calcium Carbonate-Vit D-Min 600-400 MG-UNIT TABS Take 1 tablet by mouth 2 (two) times daily.     . cetirizine (ZYRTEC) 10 MG tablet Take 10 mg by mouth daily as needed for allergies.     . Cholecalciferol (VITAMIN D3) 5000 units TABS Take 1 tablet by  mouth every other day.    . hydrocortisone (ANUSOL-HC) 25 MG suppository Place 1 suppository (25 mg total) rectally 2 (two) times daily. 12 suppository 1  . ibuprofen (ADVIL,MOTRIN) 800 MG tablet Take 1 tablet (800 mg total) by mouth every 8 (eight) hours as needed (with food). 90 tablet 2  . Multiple Vitamin (MULTIVITAMIN) capsule Take 1 capsule by mouth daily.      . Omega-3 Fatty Acids (FISH OIL PO) Take by mouth daily.      . pantoprazole (PROTONIX) 40 MG tablet Take 1 tablet (40 mg total) by mouth daily as needed. 30 tablet 11   No current facility-administered medications for this visit.     Family History  Problem Relation Age of Onset  . Cancer Mother        CA insitu of appendix  . Heart disease Father        CAD  . Diabetes Father        type II  . Diabetes Brother        type II  . Diabetes Maternal Grandfather   . Diabetes Paternal Grandmother   . Diabetes Paternal Grandfather     ROS:  Pertinent items are noted in HPI.  Otherwise, a comprehensive ROS was negative.  Exam:  There were no vitals taken for this visit.    General appearance: alert, cooperative and appears stated age Head: Normocephalic, without obvious abnormality, atraumatic Neck: no adenopathy, supple, symmetrical, trachea midline and thyroid normal to inspection and palpation Lungs: clear to auscultation bilaterally Breasts: normal appearance, no masses or tenderness, No nipple retraction or dimpling, No nipple discharge or bleeding, No axillary or supraclavicular adenopathy Heart: regular rate and rhythm Abdomen: soft, non-tender; no masses, no organomegaly Extremities: extremities normal, atraumatic, no cyanosis or edema Skin: Skin color, texture, turgor normal. No rashes or lesions Lymph nodes: Cervical, supraclavicular, and axillary nodes normal. No abnormal inguinal nodes palpated Neurologic: Grossly normal  Pelvic: External genitalia:  no lesions              Urethra:  normal appearing  urethra with no masses, tenderness or lesions              Bartholins and Skenes: normal                 Vagina: normal appearing vagina with normal color and discharge, no lesions              Cervix: no lesions              Pap taken: {yes no:314532} Bimanual Exam:  Uterus:  normal size, contour, position, consistency, mobility, non-tender              Adnexa: no mass, fullness, tenderness              Rectal exam: {yes no:314532}.  Confirms.              Anus:  normal sphincter tone, no lesions  Chaperone was present for exam.  Assessment:   Well woman visit with normal exam.   Plan: Mammogram screening discussed. Recommended self breast awareness. Pap and HR HPV as above. Guidelines for Calcium, Vitamin D, regular exercise program including cardiovascular and weight bearing exercise.   Follow up annually and prn.   Additional counseling given.  {yes [id number]. _______ minutes face to face time of which over 50% was spent in counseling.    After visit summary provided.

## 2017-08-20 ENCOUNTER — Encounter: Payer: Self-pay | Admitting: Obstetrics and Gynecology

## 2017-08-20 ENCOUNTER — Ambulatory Visit: Payer: 59 | Admitting: Obstetrics and Gynecology

## 2017-08-24 MED FILL — ALPRAZolam 0.5 MG TABS: 0.5 | 15 days supply | Qty: 30 | Fill #2

## 2017-10-13 MED FILL — ALPRAZolam 0.5 MG TABS: 0.5 | 15 days supply | Qty: 30 | Fill #3

## 2017-10-25 ENCOUNTER — Other Ambulatory Visit: Payer: Self-pay | Admitting: Obstetrics and Gynecology

## 2017-10-25 DIAGNOSIS — Z139 Encounter for screening, unspecified: Secondary | ICD-10-CM

## 2017-11-26 ENCOUNTER — Ambulatory Visit
Admission: RE | Admit: 2017-11-26 | Discharge: 2017-11-26 | Disposition: A | Discharge status: Home / Self Care | Payer: 59 | Source: Ambulatory Visit | Attending: Obstetrics and Gynecology | Admitting: Obstetrics and Gynecology

## 2017-11-26 ENCOUNTER — Ambulatory Visit: Payer: 59

## 2017-11-26 DIAGNOSIS — Z1231 Encounter for screening mammogram for malignant neoplasm of breast: Secondary | ICD-10-CM | POA: Diagnosis not present

## 2017-11-26 DIAGNOSIS — Z139 Encounter for screening, unspecified: Secondary | ICD-10-CM

## 2017-12-10 DIAGNOSIS — H16223 Keratoconjunctivitis sicca, not specified as Sjogren's, bilateral: Secondary | ICD-10-CM | POA: Diagnosis not present

## 2017-12-10 DIAGNOSIS — H04123 Dry eye syndrome of bilateral lacrimal glands: Secondary | ICD-10-CM | POA: Diagnosis not present

## 2017-12-10 MED FILL — FLUOROMETHOLONE 0.1% DROPS: 0.1 | 30 days supply | Qty: 10 | Fill #0

## 2017-12-10 MED FILL — NEO/POLY/DEXAMET EYE OINT: 3.5-10000-0 | 30 days supply | Qty: 4 | Fill #0

## 2017-12-15 ENCOUNTER — Ambulatory Visit: Payer: 59

## 2018-01-06 DIAGNOSIS — H04123 Dry eye syndrome of bilateral lacrimal glands: Secondary | ICD-10-CM | POA: Diagnosis not present

## 2018-01-06 DIAGNOSIS — H16223 Keratoconjunctivitis sicca, not specified as Sjogren's, bilateral: Secondary | ICD-10-CM | POA: Diagnosis not present

## 2018-01-06 DIAGNOSIS — H524 Presbyopia: Secondary | ICD-10-CM | POA: Diagnosis not present

## 2018-01-17 ENCOUNTER — Encounter: Payer: Self-pay | Admitting: Family Medicine

## 2018-01-19 ENCOUNTER — Other Ambulatory Visit: Payer: Self-pay | Admitting: Family Medicine

## 2018-01-20 MED FILL — ALPRAZolam 0.5 MG TABS: 0.5 | 15 days supply | Qty: 30 | Fill #0

## 2018-01-20 NOTE — Telephone Encounter (Signed)
CPE scheduled on 03/02/18, last filled on 04/15/17 #30 tabs with 3 additional refills

## 2018-01-21 MED FILL — XIIDRA 5% EYE DROPS: 5 | 30 days supply | Qty: 60 | Fill #0

## 2018-03-02 ENCOUNTER — Encounter: Payer: Self-pay | Admitting: Family Medicine

## 2018-03-02 ENCOUNTER — Ambulatory Visit (INDEPENDENT_AMBULATORY_CARE_PROVIDER_SITE_OTHER): Payer: 59 | Admitting: Family Medicine

## 2018-03-02 VITALS — BP 124/82 | HR 81 | Temp 98.2°F | Ht 61.75 in | Wt 192.0 lb

## 2018-03-02 DIAGNOSIS — E7800 Pure hypercholesterolemia, unspecified: Secondary | ICD-10-CM

## 2018-03-02 DIAGNOSIS — E559 Vitamin D deficiency, unspecified: Secondary | ICD-10-CM | POA: Insufficient documentation

## 2018-03-02 DIAGNOSIS — D2361 Other benign neoplasm of skin of right upper limb, including shoulder: Secondary | ICD-10-CM

## 2018-03-02 DIAGNOSIS — R5382 Chronic fatigue, unspecified: Secondary | ICD-10-CM

## 2018-03-02 DIAGNOSIS — R739 Hyperglycemia, unspecified: Secondary | ICD-10-CM | POA: Diagnosis not present

## 2018-03-02 DIAGNOSIS — L989 Disorder of the skin and subcutaneous tissue, unspecified: Secondary | ICD-10-CM | POA: Diagnosis not present

## 2018-03-02 DIAGNOSIS — R5383 Other fatigue: Secondary | ICD-10-CM | POA: Insufficient documentation

## 2018-03-02 NOTE — Progress Notes (Signed)
Subjective:    Patient ID: Jamie Coleman, female    DOB: 1970/12/20, 47 y.o.   MRN: [id number]  HPI  Here for f/u of chronic health problems   Kids are grad HS -exciting     Wt Readings from Last 3 Encounters:  03/02/18 192 lb (87.1 kg)  09/29/16 192 lb (87.1 kg)  08/19/16 190 lb 6.4 oz (86.4 kg)   35.40 kg/m   Wants to check a skin lesion on her back and on forehead   Has had a fair amt of sun burns growing up  Now uses sunscreen    Mood - worse anxiety this month -(refilled xaxax)- a lot going on and also some travel A lot of family in for graduation    Tired- working 6 days per week  Has not had thyroid   Hyperglycemia Lab Results  Component Value Date   HGBA1C 5.8 (H) 08/14/2015  due for labs  Diet is about the same -eats healthy foods an some red meat and cheese  Not a lot of simple carbs   Hyperlipidemia Lab Results  Component Value Date   CHOL 241 (H) 08/14/2015   HDL 58 08/14/2015   LDLCALC 128 08/14/2015   LDLDIRECT 87.8 12/01/2012   TRIG 273 (H) 08/14/2015   CHOLHDL 4.2 08/14/2015   Vit D def- takes 5000 iu three days per week  Patient Active Problem List   Diagnosis Date Noted  . Vitamin D deficiency 03/02/2018  . Skin lesion 03/02/2018  . Fatigue 03/02/2018  . Pupil asymmetry 08/04/2016  . Scapholunate dissociation of left wrist 05/08/2016  . Epicondylitis, lateral 04/09/2015  . Hemorrhoids 12/28/2014  . Hyperglycemia 08/28/2014  . Rash and nonspecific skin eruption 08/26/2014  . Exercise intolerance 08/07/2013  . Tremor 06/30/2013  . Mouth pain 03/01/2013  . Sebaceous cyst 02/15/2013  . Actinic keratosis of right cheek 02/15/2013  . Tingling 06/24/2012  . Tachycardia 01/30/2011  . Dizziness 01/30/2011  . OTHER&UNSPECIFIED DISEASES THE ORAL SOFT TISSUES 07/25/2008  . DERMATOFIBROMA, ARM 11/11/2007  . NEOPLASM, SKIN, UNCERTAIN BEHAVIOR 74/25/9563  . Hyperlipidemia 02/10/2007  . ANXIETY, SITUATIONAL 02/10/2007  . ALLERGIC  RHINITIS 02/10/2007  . GERD 02/10/2007  . IBS 02/10/2007  . POSTPARTUM DEPRESSION 02/10/2007   Past Medical History:  Diagnosis Date  . Allergy    allergic rhinitis  . Anxiety    situational  . Difficult intubation    states 2012 ablation surgery, had diff intubation,glide scope used, not noted in EPIC anesthesia record  . Elevated hemoglobin A1c 08/08/15  . Elevated liver function tests 08/08/15  . GERD (gastroesophageal reflux disease)   . Hyperlipidemia   . Low serum vitamin D 08/08/15  . Status post endometrial ablation   . Tennis elbow    currently in PT-10/16   Past Surgical History:  Procedure Laterality Date  . CESAREAN SECTION    . ENDOMETRIAL ABLATION     8/12  . EXTREMITY WIRE/PIN REMOVAL Left 07/16/2016   Procedure: PIN REMOVAL OF LEFT WRIST X 2;  Surgeon: Roseanne Kaufman, MD;  Location: [facility];  Service: Orthopedics;  Laterality: Left;  . FOOT SURGERY    . LAPAROSCOPIC TUBAL LIGATION  06/09/2011   Procedure: LAPAROSCOPIC TUBAL LIGATION;  Surgeon: Arloa Koh;  Location: [facility];  Service: Gynecology;  Laterality: N/A;  . LIGAMENT REPAIR Left 05/08/2016   Procedure: LEFT WRIST SCAPHOLUNATE LIGAMENT RECONSTRUCTION WITH TENDON GRAFT PIN NEURECTOMY AND REPAIR;  Surgeon: Roseanne Kaufman, MD;  Location: [facility];  Service: Orthopedics;  Laterality: Left;  . TONSILLECTOMY     as child  . TUBAL LIGATION  8/12   Social History   Tobacco Use  . Smoking status: Former Smoker    Last attempt to quit: 07/25/1983    Years since quitting: 34.6  . Smokeless tobacco: Never Used  . Tobacco comment: in high school  Substance Use Topics  . Alcohol use: Yes    Alcohol/week: 6.0 oz    Types: 10 Standard drinks or equivalent per week    Comment: 7-10 drinks a week (alcohol, wine, or beer)/ daily  . Drug use: No   Family History  Problem Relation Age of Onset  . Cancer Mother        CA insitu of appendix  . Heart disease Father        CAD   . Diabetes Father        type II  . Diabetes Brother        type II  . Diabetes Maternal Grandfather   . Diabetes Paternal Grandmother   . Diabetes Paternal Grandfather    Allergies  Allergen Reactions  . Lipitor [Atorvastatin]     myalgias   Current Outpatient Medications on File Prior to Visit  Medication Sig Dispense Refill  . ALPRAZolam (XANAX) 0.5 MG tablet TAKE 1 TABLET BY MOUTH TWICE DAILY AS NEEDED FOR ANXIETY 30 tablet 0  . Calcium Carbonate-Vit D-Min 600-400 MG-UNIT TABS Take 1 tablet by mouth 2 (two) times daily.     . Cholecalciferol (VITAMIN D3) 5000 units TABS Take 1 tablet by mouth every other day.    . famotidine (PEPCID) 20 MG tablet Take 20 mg by mouth 2 (two) times daily.    . fexofenadine (ALLEGRA) 180 MG tablet Take 180 mg by mouth daily.    Marland Kitchen ibuprofen (ADVIL,MOTRIN) 800 MG tablet Take 1 tablet (800 mg total) by mouth every 8 (eight) hours as needed (with food). 90 tablet 2  . Multiple Vitamin (MULTIVITAMIN) capsule Take 1 capsule by mouth daily.      . Omega-3 Fatty Acids (FISH OIL PO) Take by mouth daily.      Marland Kitchen XIIDRA 5 % SOLN      No current facility-administered medications on file prior to visit.     Review of Systems  Constitutional: Positive for fatigue. Negative for activity change, appetite change, fever and unexpected weight change.  HENT: Negative for congestion, ear pain, rhinorrhea, sinus pressure and sore throat.   Eyes: Negative for pain, redness and visual disturbance.  Respiratory: Negative for cough, shortness of breath and wheezing.   Cardiovascular: Negative for chest pain and palpitations.  Gastrointestinal: Negative for abdominal pain, blood in stool, constipation and diarrhea.  Endocrine: Negative for polydipsia and polyuria.  Genitourinary: Negative for dysuria, frequency and urgency.  Musculoskeletal: Negative for arthralgias, back pain and myalgias.  Skin: Negative for pallor and rash.       Moles/skin lesions     Allergic/Immunologic: Negative for environmental allergies.  Neurological: Negative for dizziness, syncope and headaches.  Hematological: Negative for adenopathy. Does not bruise/bleed easily.  Psychiatric/Behavioral: Negative for decreased concentration and dysphoric mood. The patient is nervous/anxious.        Stressors rev        Objective:   Physical Exam  Constitutional: She appears well-developed and well-nourished. No distress.  obese and well appearing   HENT:  Head: Normocephalic and atraumatic.  Right Ear: External ear normal.  Left Ear: External ear normal.  Nose: Nose normal.  Mouth/Throat: Oropharynx is clear and moist.  Eyes: Pupils are equal, round, and reactive to light. Conjunctivae and EOM are normal. Right eye exhibits no discharge. Left eye exhibits no discharge. No scleral icterus.  Neck: Normal range of motion. Neck supple. No JVD present. Carotid bruit is not present. No thyromegaly present.  Cardiovascular: Normal rate, regular rhythm, normal heart sounds and intact distal pulses. Exam reveals no gallop.  Pulmonary/Chest: Effort normal and breath sounds normal. No respiratory distress. She has no wheezes. She has no rales.  Abdominal: Soft. Bowel sounds are normal.  Musculoskeletal: She exhibits no edema or tenderness.  No kyphosis   Lymphadenopathy:    She has no cervical adenopathy.  Neurological: She is alert. She has normal reflexes. She displays normal reflexes. No cranial nerve deficit. She exhibits normal muscle tone. Coordination normal.  Skin: Skin is warm and dry. No rash noted. No erythema. No pallor.  1 cm brown SK mid back   3-4 mm oval area of hyperpigmentation /slt raised on forehead (resembles early sk)   Stable dermatofibroma R arm  Solar lentigines diffusely  Solar aging   Psychiatric: She has a normal mood and affect.  Pleasant  Talkative           Assessment & Plan:   Problem List Items Addressed This Visit       Musculoskeletal and Integument   DERMATOFIBROMA, ARM - Primary    Appears stable      Skin lesion    Oval tan area 3-4 mm on forehead - slt raised  Suspect early SK Ref to dermatology for further eval/tx   Also 1 cm SK on back-tan and raised        Relevant Orders   Ambulatory referral to Dermatology     Other   Fatigue    Likely due to long work hours and schedule  Needs more time for self care  Will get labs incl tsh/cbc/cmet      Relevant Orders   Comprehensive metabolic panel   TSH   CBC with Differential/Platelet   Hyperglycemia    Due for A1C Diet fairly healthy  disc imp of low glycemic diet and wt loss to prevent DM2       Relevant Orders   Hemoglobin A1c   Hyperlipidemia    Due for labs Disc goals for lipids and reasons to control them Rev last labs with pt Rev low sat fat diet in detail        Relevant Orders   Comprehensive metabolic panel   Lipid panel   Vitamin D deficiency    Due for re check  Takes 5000 iu of D3 three days per week  Disc importance to bone and overall health       Relevant Orders   VITAMIN D 25 Hydroxy (Vit-D Deficiency, Fractures)

## 2018-03-02 NOTE — Patient Instructions (Addendum)
Schedule a fasting lab appt on the way out   We will call you about the dermatology appointment   Take care of yourself ! Eat healthy and stay active

## 2018-03-03 DIAGNOSIS — L570 Actinic keratosis: Secondary | ICD-10-CM | POA: Diagnosis not present

## 2018-03-03 DIAGNOSIS — D225 Melanocytic nevi of trunk: Secondary | ICD-10-CM | POA: Diagnosis not present

## 2018-03-03 DIAGNOSIS — Z1283 Encounter for screening for malignant neoplasm of skin: Secondary | ICD-10-CM | POA: Diagnosis not present

## 2018-03-03 DIAGNOSIS — X32XXXA Exposure to sunlight, initial encounter: Secondary | ICD-10-CM | POA: Diagnosis not present

## 2018-03-03 NOTE — Assessment & Plan Note (Signed)
Due for A1C Diet fairly healthy  disc imp of low glycemic diet and wt loss to prevent DM2

## 2018-03-03 NOTE — Assessment & Plan Note (Signed)
Recently refilled xanax -does not use often  Exp family change and stressors Reviewed stressors/ coping techniques/symptoms/ support sources/ tx options and side effects in detail today  Enc to use only when needed  Disc sedation/habit potential of xanax

## 2018-03-03 NOTE — Assessment & Plan Note (Signed)
Due for re check  Takes 5000 iu of D3 three days per week  Disc importance to bone and overall health

## 2018-03-03 NOTE — Assessment & Plan Note (Signed)
Due for labs  Disc goals for lipids and reasons to control them Rev last labs with pt Rev low sat fat diet in detail   

## 2018-03-03 NOTE — Assessment & Plan Note (Signed)
Oval tan area 3-4 mm on forehead - slt raised  Suspect early SK Ref to dermatology for further eval/tx   Also 1 cm SK on back-tan and raised

## 2018-03-03 NOTE — Assessment & Plan Note (Signed)
Likely due to long work hours and schedule  Needs more time for self care  Will get labs incl tsh/cbc/cmet

## 2018-03-03 NOTE — Assessment & Plan Note (Signed)
Appears stable 

## 2018-03-10 ENCOUNTER — Other Ambulatory Visit: Payer: 59

## 2018-03-14 ENCOUNTER — Other Ambulatory Visit: Payer: Self-pay | Admitting: Family Medicine

## 2018-03-14 MED FILL — ALPRAZolam 0.5 MG TABS: 0.5 | 15 days supply | Qty: 30 | Fill #0

## 2018-03-14 NOTE — Telephone Encounter (Signed)
CPE was on 03/02/18, last filled on 01/20/18 #30 tabs with 0 refills

## 2018-03-17 DIAGNOSIS — H16223 Keratoconjunctivitis sicca, not specified as Sjogren's, bilateral: Secondary | ICD-10-CM | POA: Diagnosis not present

## 2018-03-17 DIAGNOSIS — H04123 Dry eye syndrome of bilateral lacrimal glands: Secondary | ICD-10-CM | POA: Diagnosis not present

## 2018-03-21 ENCOUNTER — Encounter: Payer: Self-pay | Admitting: Obstetrics and Gynecology

## 2018-03-21 ENCOUNTER — Ambulatory Visit (INDEPENDENT_AMBULATORY_CARE_PROVIDER_SITE_OTHER): Payer: 59 | Admitting: Obstetrics and Gynecology

## 2018-03-21 ENCOUNTER — Other Ambulatory Visit (HOSPITAL_COMMUNITY)
Admission: RE | Admit: 2018-03-21 | Discharge: 2018-03-21 | Disposition: A | Discharge status: Home / Self Care | Payer: 59 | Source: Ambulatory Visit | Attending: Obstetrics and Gynecology | Admitting: Obstetrics and Gynecology

## 2018-03-21 ENCOUNTER — Other Ambulatory Visit: Payer: Self-pay

## 2018-03-21 VITALS — BP 116/68 | HR 88 | Resp 16 | Ht 62.0 in | Wt 193.0 lb

## 2018-03-21 DIAGNOSIS — Z1211 Encounter for screening for malignant neoplasm of colon: Secondary | ICD-10-CM | POA: Diagnosis not present

## 2018-03-21 DIAGNOSIS — N951 Menopausal and female climacteric states: Secondary | ICD-10-CM

## 2018-03-21 DIAGNOSIS — Z1151 Encounter for screening for human papillomavirus (HPV): Secondary | ICD-10-CM | POA: Diagnosis not present

## 2018-03-21 DIAGNOSIS — N6452 Nipple discharge: Secondary | ICD-10-CM | POA: Diagnosis not present

## 2018-03-21 DIAGNOSIS — N6310 Unspecified lump in the right breast, unspecified quadrant: Secondary | ICD-10-CM

## 2018-03-21 DIAGNOSIS — Z01419 Encounter for gynecological examination (general) (routine) without abnormal findings: Secondary | ICD-10-CM | POA: Diagnosis not present

## 2018-03-21 MED FILL — XIIDRA 5% EYE DROPS: 5 | 30 days supply | Qty: 60 | Fill #1

## 2018-03-21 NOTE — Progress Notes (Signed)
47 y.o. G9P1002 Married Caucasian female here for annual exam.    Saw PCP recently and seeing dermatology for forehead lesion.   No hot flashes but feels hot all the time.  Thinks she does have spotting once a year, very small amount.  Last time was 6 months ago.   Bilateral breast discharge from multiple nipples since her 41s.  States hx of elevated prolactin many years ago.  Daughters have graduated this year.  Both going to college.   PCP: Dr. Loura Pardon    Patient's last menstrual period was 09/20/2017 (approximate).           Sexually active: Yes.    The current method of family planning is tubal ligation and ablation.    Exercising: Yes.    walking Smoker:  Former  Health Maintenance: Pap:  07-27-14 Neg:Neg HR HPV History of abnormal Pap:  Yes, colposcopy 2011 MMG:  11/26/17 BIRADS 1 negative/density c TDaP:  Up to date per patient Gardasil:   no HIV: negative in pregnancy Hep C: never  Screening Labs:  PCP   reports that she quit smoking about 34 years ago. She has never used smokeless tobacco. She reports that she drinks about 6.0 oz of alcohol per week. She reports that she does not use drugs.  Past Medical History:  Diagnosis Date  . Allergy    allergic rhinitis  . Anxiety    situational  . Difficult intubation    states 2012 ablation surgery, had diff intubation,glide scope used, not noted in EPIC anesthesia record  . Elevated hemoglobin A1c 08/08/15  . Elevated liver function tests 08/08/15  . GERD (gastroesophageal reflux disease)   . Hyperlipidemia   . Low serum vitamin D 08/08/15  . Status post endometrial ablation   . Tennis elbow    currently in PT-10/16    Past Surgical History:  Procedure Laterality Date  . CESAREAN SECTION    . ENDOMETRIAL ABLATION     8/12  . EXTREMITY WIRE/PIN REMOVAL Left 07/16/2016   Procedure: PIN REMOVAL OF LEFT WRIST X 2;  Surgeon: Roseanne Kaufman, MD;  Location: [facility];  Service: Orthopedics;   Laterality: Left;  . FOOT SURGERY    . LAPAROSCOPIC TUBAL LIGATION  06/09/2011   Procedure: LAPAROSCOPIC TUBAL LIGATION;  Surgeon: Arloa Koh;  Location: [facility];  Service: Gynecology;  Laterality: N/A;  . LIGAMENT REPAIR Left 05/08/2016   Procedure: LEFT WRIST SCAPHOLUNATE LIGAMENT RECONSTRUCTION WITH TENDON GRAFT PIN NEURECTOMY AND REPAIR;  Surgeon: Roseanne Kaufman, MD;  Location: [facility];  Service: Orthopedics;  Laterality: Left;  . TONSILLECTOMY     as child  . TUBAL LIGATION  8/12    Current Outpatient Medications  Medication Sig Dispense Refill  . ALPRAZolam (XANAX) 0.5 MG tablet TAKE 1 TABLET BY MOUTH TWICE DAILY AS NEEDED FOR ANXIETY 30 tablet 1  . Calcium Carbonate-Vit D-Min 600-400 MG-UNIT TABS Take 1 tablet by mouth 2 (two) times daily.     . Cholecalciferol (VITAMIN D3) 5000 units TABS Take 1 tablet by mouth every other day.    . famotidine (PEPCID) 20 MG tablet Take 20 mg by mouth 2 (two) times daily.    . fexofenadine (ALLEGRA) 180 MG tablet Take 180 mg by mouth daily.    Marland Kitchen ibuprofen (ADVIL,MOTRIN) 800 MG tablet Take 1 tablet (800 mg total) by mouth every 8 (eight) hours as needed (with food). 90 tablet 2  . Multiple Vitamin (MULTIVITAMIN) capsule Take 1 capsule by  mouth daily.      . Omega-3 Fatty Acids (FISH OIL PO) Take by mouth daily.      Marland Kitchen XIIDRA 5 % SOLN      No current facility-administered medications for this visit.     Family History  Problem Relation Age of Onset  . Cancer Mother        CA insitu of appendix  . Heart disease Father        CAD  . Diabetes Father        type II  . Diabetes Brother        type II  . Diabetes Maternal Grandfather   . Diabetes Paternal Grandmother   . Diabetes Paternal Grandfather     Review of Systems  Exam:   BP 116/68 (BP Location: Right Arm, Patient Position: Sitting, Cuff Size: Large)   Pulse 88   Resp 16   Ht 5\' 2"  (1.575 m)   Wt 193 lb (87.5 kg)   LMP 09/20/2017 (Approximate)   BMI 35.30  kg/m     General appearance: alert, cooperative and appears stated age Head: Normocephalic, without obvious abnormality, atraumatic Neck: no adenopathy, supple, symmetrical, trachea midline and thyroid normal to inspection and palpation Lungs: clear to auscultation bilaterally Breasts: normal appearance, no masses or tenderness on left and 3 - 4 mm firm lump of right breast at 7:00 on periphery of the areola, No nipple retraction or dimpling, clear yellowish discharge from bilateral breasts from multiple ducts, no nipple bleeding, No axillary or supraclavicular adenopathy Heart: regular rate and rhythm Abdomen: soft, non-tender; no masses, no organomegaly Extremities: extremities normal, atraumatic, no cyanosis or edema Skin: Skin color, texture, turgor normal. No rashes or lesions Lymph nodes: Cervical, supraclavicular, and axillary nodes normal. No abnormal inguinal nodes palpated Neurologic: Grossly normal  Pelvic: External genitalia:  no lesions              Urethra:  normal appearing urethra with no masses, tenderness or lesions              Bartholins and Skenes: normal                 Vagina: normal appearing vagina with normal color and discharge, no lesions              Cervix: no lesions              Pap taken: Yes.   Bimanual Exam:  Uterus:  normal size, contour, position, consistency, mobility, non-tender              Adnexa: no mass, fullness, tenderness              Rectal exam: Yes.  .  Confirms.              Anus:  normal sphincter tone, no lesions  Chaperone was present for exam.  Assessment:   Well woman visit with normal exam. Status post BTL and endometrial ablation.  Menopausal symptoms.  Right breast lump.  Bilateral nipple discharge.  Hx elevated prolactin.   Plan: Mammogram bilateral diagnostic and bilateral breast ultrasound.  Recommended self breast awareness. Pap and HR HPV as above. Guidelines for Calcium, Vitamin D, regular exercise program  including cardiovascular and weight bearing exercise. Check FSH, estradiol, TSH, and prolactin.  IFOB. Follow up annually and prn.   After visit summary provided.

## 2018-03-21 NOTE — Patient Instructions (Signed)

## 2018-03-22 ENCOUNTER — Telehealth: Payer: Self-pay

## 2018-03-22 DIAGNOSIS — N6452 Nipple discharge: Secondary | ICD-10-CM

## 2018-03-22 DIAGNOSIS — N6310 Unspecified lump in the right breast, unspecified quadrant: Secondary | ICD-10-CM

## 2018-03-22 LAB — ESTRADIOL: Estradiol: 20.5 pg/mL

## 2018-03-22 LAB — FOLLICLE STIMULATING HORMONE: FSH: 8.7 m[IU]/mL

## 2018-03-22 LAB — PROLACTIN: Prolactin: 11.2 ng/mL (ref 4.8–23.3)

## 2018-03-22 LAB — TSH: TSH: 2.61 u[IU]/mL (ref 0.450–4.500)

## 2018-03-22 NOTE — Telephone Encounter (Signed)
Patient left office yesterday without scheduling mammogram imaging. Spoke with patient who would like an early morning appointment on 6/24 or after due to travel.   [facility] Appointment for bilateral diagnostic mammogram with ultrasounds scheduled for 04/04/2018 at 7:30 am. Placed in mammogram hold. Spoke with patient who is agreeable to appointment date and time.  Routing to provider for final review. Patient agreeable to disposition. Will close encounter.

## 2018-03-23 LAB — CYTOLOGY - PAP
Diagnosis: NEGATIVE
HPV: NOT DETECTED

## 2018-04-04 ENCOUNTER — Ambulatory Visit
Admission: RE | Admit: 2018-04-04 | Discharge: 2018-04-04 | Disposition: A | Discharge status: Home / Self Care | Payer: 59 | Source: Ambulatory Visit | Attending: Obstetrics and Gynecology | Admitting: Obstetrics and Gynecology

## 2018-04-04 ENCOUNTER — Ambulatory Visit: Payer: 59

## 2018-04-04 DIAGNOSIS — N6452 Nipple discharge: Secondary | ICD-10-CM

## 2018-04-04 DIAGNOSIS — N6310 Unspecified lump in the right breast, unspecified quadrant: Secondary | ICD-10-CM

## 2018-04-04 DIAGNOSIS — L570 Actinic keratosis: Secondary | ICD-10-CM | POA: Diagnosis not present

## 2018-04-04 DIAGNOSIS — X32XXXD Exposure to sunlight, subsequent encounter: Secondary | ICD-10-CM | POA: Diagnosis not present

## 2018-04-04 DIAGNOSIS — R922 Inconclusive mammogram: Secondary | ICD-10-CM | POA: Diagnosis not present

## 2018-05-04 MED FILL — ALPRAZolam 0.5 MG TABS: 0.5 | 15 days supply | Qty: 30 | Fill #1

## 2018-06-26 ENCOUNTER — Telehealth: Payer: Self-pay | Admitting: Obstetrics and Gynecology

## 2018-06-26 NOTE — Telephone Encounter (Signed)
Please contact patient to remind her to send her IFOB in to the lab.  She came up in my reminder box in Epic.  

## 2018-06-27 DIAGNOSIS — H04123 Dry eye syndrome of bilateral lacrimal glands: Secondary | ICD-10-CM | POA: Diagnosis not present

## 2018-06-27 DIAGNOSIS — H5711 Ocular pain, right eye: Secondary | ICD-10-CM | POA: Diagnosis not present

## 2018-06-27 DIAGNOSIS — G08 Intracranial and intraspinal phlebitis and thrombophlebitis: Secondary | ICD-10-CM | POA: Diagnosis not present

## 2018-06-27 DIAGNOSIS — H16223 Keratoconjunctivitis sicca, not specified as Sjogren's, bilateral: Secondary | ICD-10-CM | POA: Diagnosis not present

## 2018-06-27 NOTE — Telephone Encounter (Signed)
My chart message sent reminding patient to do IFOB.

## 2018-06-28 ENCOUNTER — Other Ambulatory Visit (HOSPITAL_COMMUNITY): Payer: Self-pay | Admitting: Surgery

## 2018-06-28 DIAGNOSIS — G902 Horner's syndrome: Secondary | ICD-10-CM

## 2018-06-28 DIAGNOSIS — G08 Intracranial and intraspinal phlebitis and thrombophlebitis: Secondary | ICD-10-CM

## 2018-07-04 ENCOUNTER — Ambulatory Visit (HOSPITAL_COMMUNITY): Admission: RE | Admit: 2018-07-04 | Payer: 59 | Source: Ambulatory Visit

## 2018-07-04 ENCOUNTER — Ambulatory Visit (HOSPITAL_COMMUNITY): Payer: 59

## 2018-08-02 IMAGING — MR MR MRA HEAD W/O CM
1 series · 19 of 48 positions shown · IV contrast (multihance)
Comparison: None.

CLINICAL DATA: Unable people size for 2 weeks, left larger than
right

EXAM:
MRA NECK WITHOUT AND WITH CONTRAST
MRA HEAD WITHOUT CONTRAST
TECHNIQUE: Multiplanar and multiecho pulse sequences of the neck were obtained
without and with intravenous contrast. Angiographic images of the
neck were obtained using MRA technique without and with intravenous
contast.; Angiographic images of the Circle of Willis were obtained
using MRA technique without intravenous contrast.
CONTRAST:  18mL MULTIHANCE GADOBENATE DIMEGLUMINE 529 MG/ML IV SOLN

[Series 3: (id) mt fs · axial · 1.4mm · 0.39mm/px · z∈[-57,+53]mm · 19 of 162 slices shown]
[im 1/162]
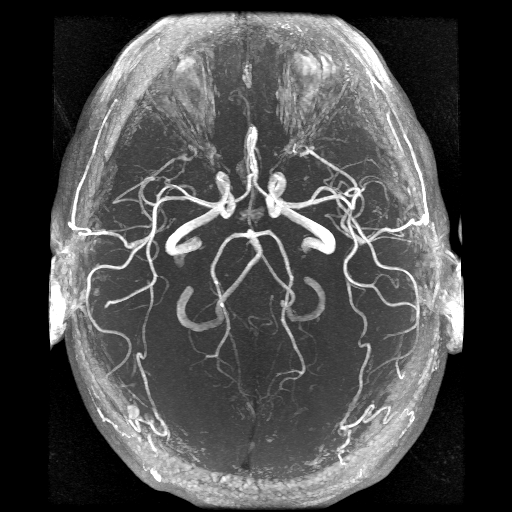
[im 4/162]
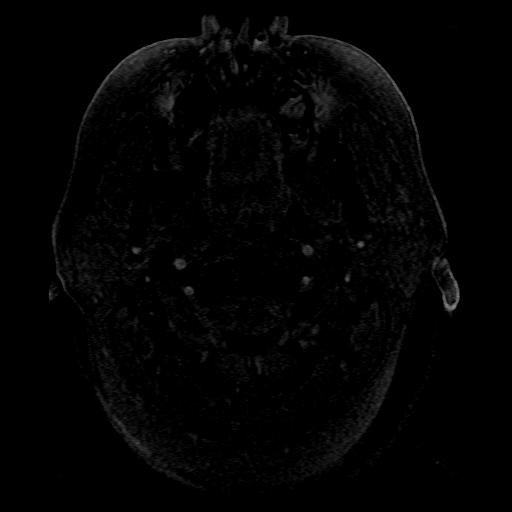
[im 7/162]
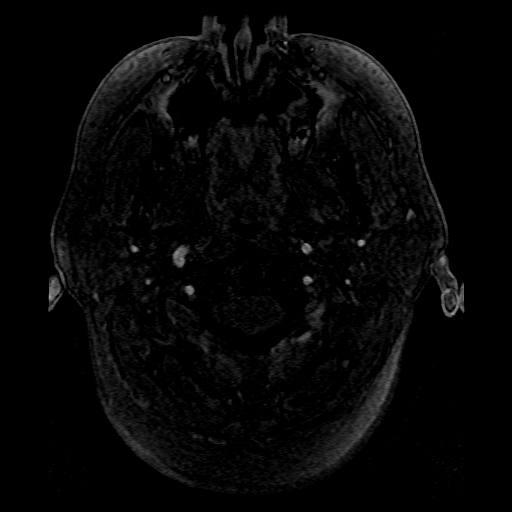
[im 11/162]
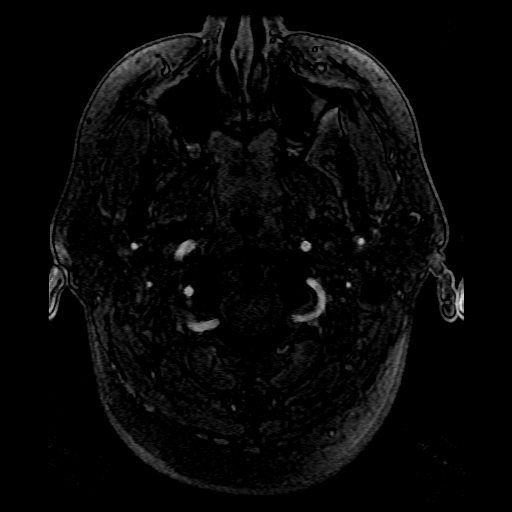
[im 14/162]
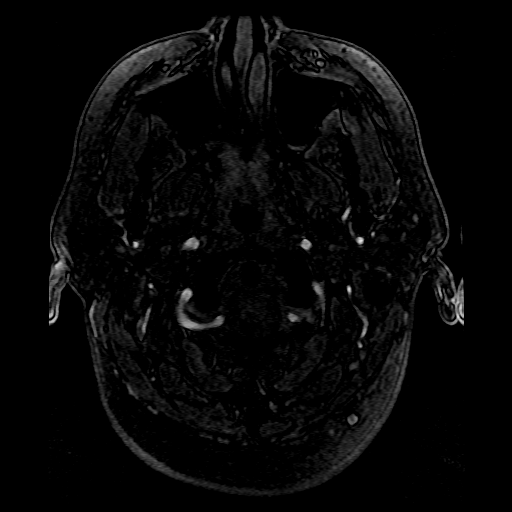
[im 18/162]
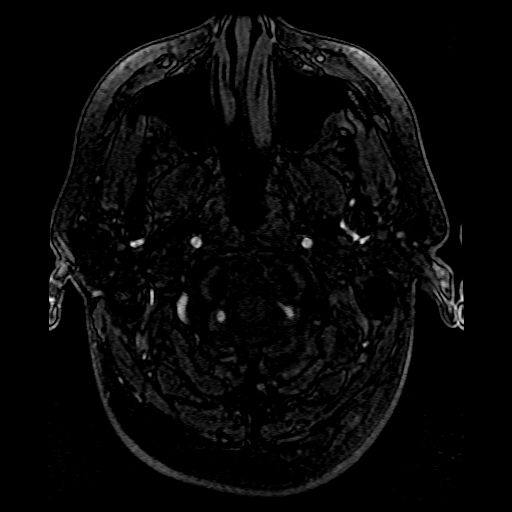
[im 21/162]
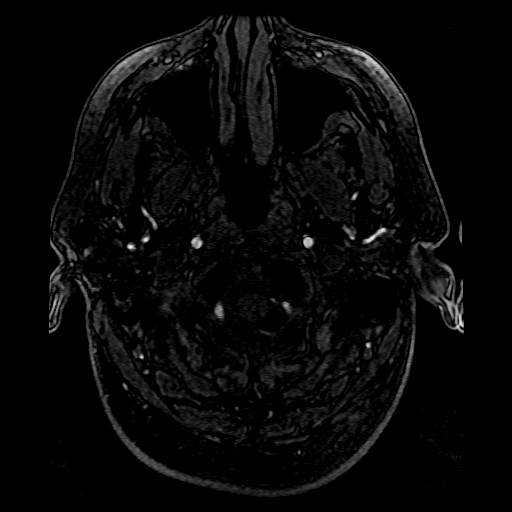
[im 24/162]
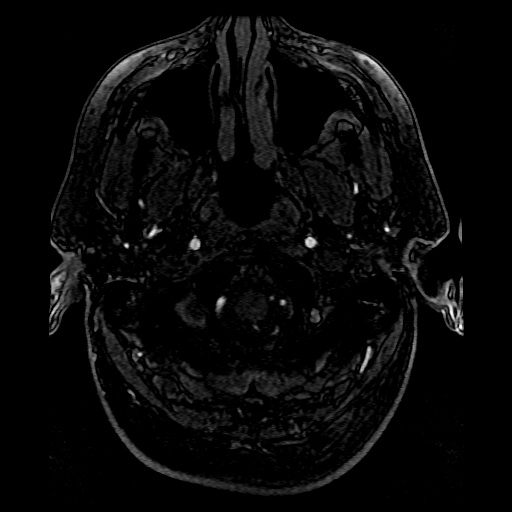
[im 28/162]
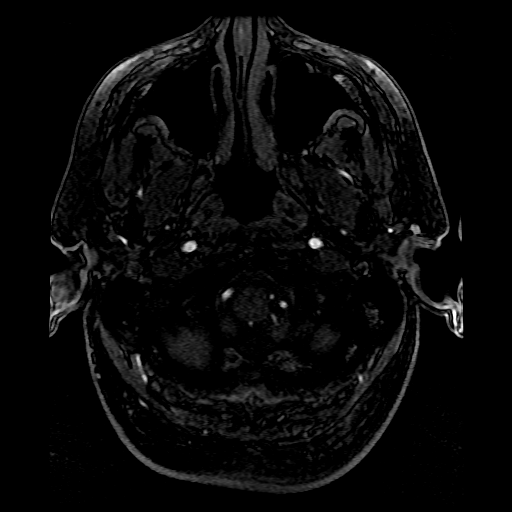
[im 31/162]
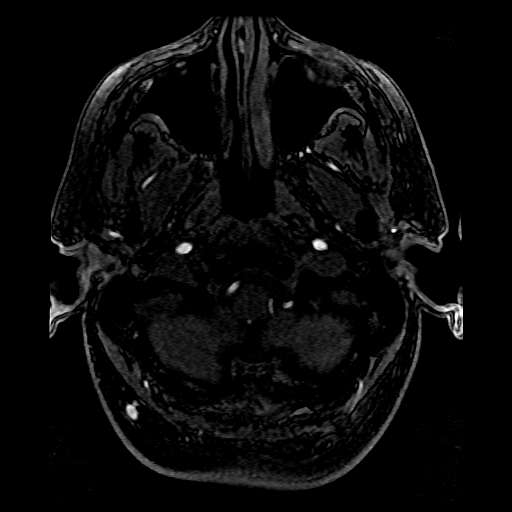
[im 35/162]
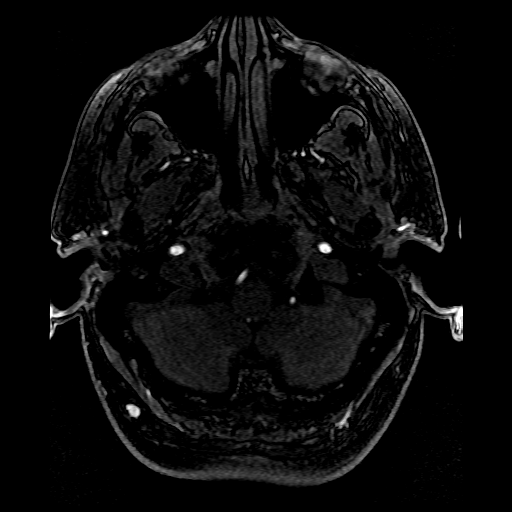
[im 52/162]
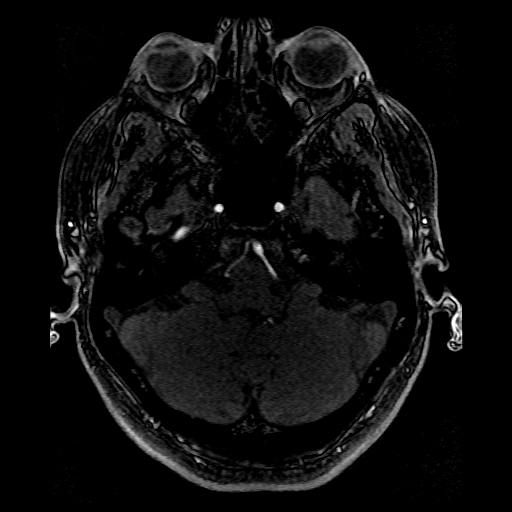
[im 72/162]
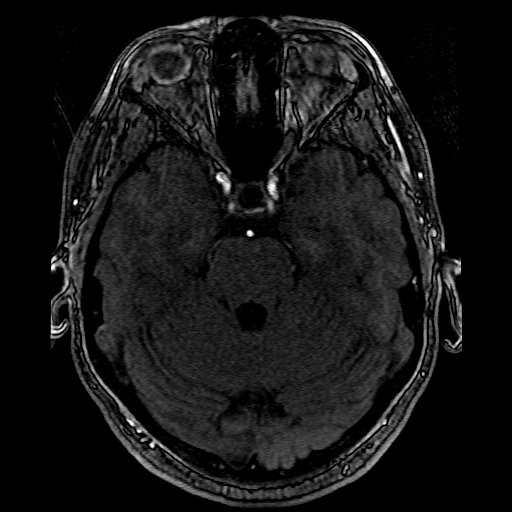
[im 83/162]
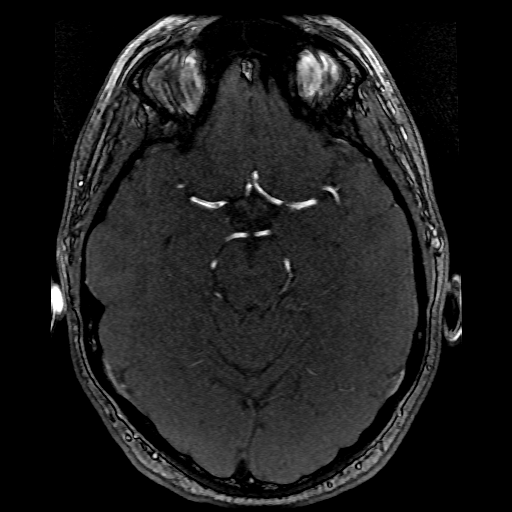
[im 93/162]
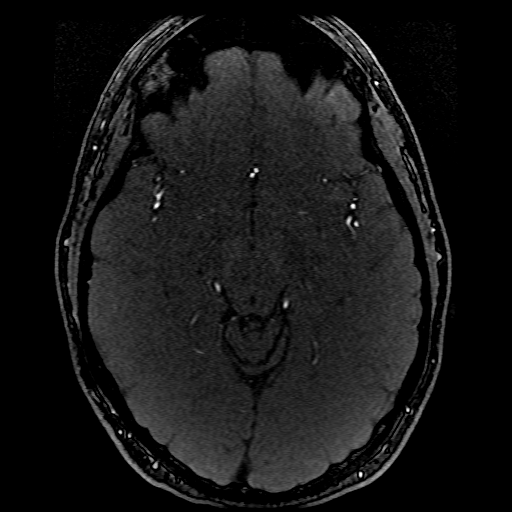
[im 114/162]
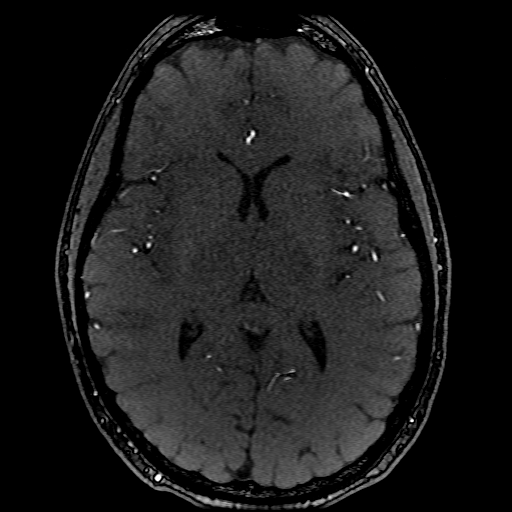
[im 134/162]
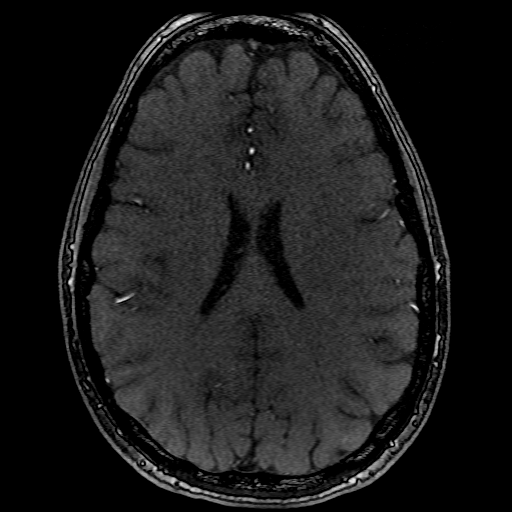
[im 138/162]
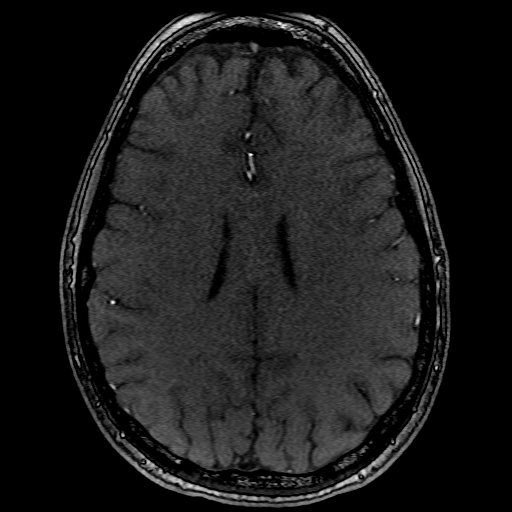
[im 155/162]
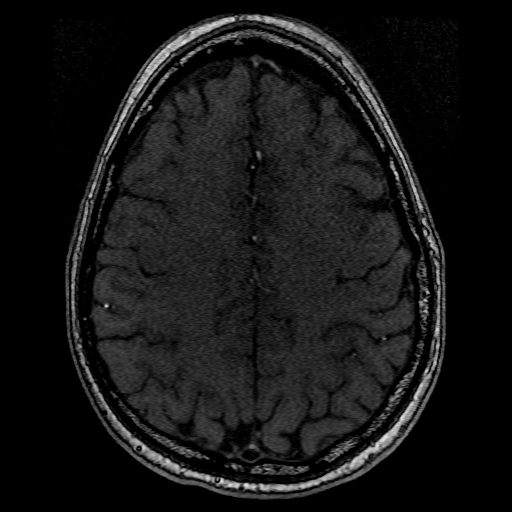

[19 of 48 positions shown; findings below may reference images not displayed]

FINDINGS: MRA NECK FINDINGS

Aortic arch:  Normal three-vessel branch pattern.

Right carotid system: No stenosis, aneurysm or evidence of
dissection.

Left carotid system: No stenosis, aneurysm or evidence of
dissection.

Vertebral arteries: Vertebral system is mildly right-dominant. Both
vertebral artery origins are clearly patent. No focal stenosis or
evidence of dissection.

MRA HEAD FINDINGS

Intracranial internal carotid arteries: Normal.

Anterior cerebral arteries: Normal.

Middle cerebral arteries: Normal.

Posterior communicating arteries: Absent bilaterally.

Posterior cerebral arteries: Normal.

Basilar artery: Normal.

Vertebral arteries: Left dominant. Normal.

Superior cerebellar arteries: Normal.

Anterior inferior cerebellar arteries: Normal on the right. Not
clearly seen on the left, which is not uncommon.

Posterior inferior cerebellar arteries: Normal.
IMPRESSION: Normal MRA of the head and neck.

## 2018-09-20 ENCOUNTER — Other Ambulatory Visit: Payer: Self-pay | Admitting: Family Medicine

## 2018-09-20 MED FILL — ALPRAZolam 0.5 MG TABS: 0.5 | 15 days supply | Qty: 30 | Fill #0

## 2018-09-20 NOTE — Telephone Encounter (Signed)
Last filled 05-03-18 #30 Last OV 03-02-18 No Future Scraper

## 2018-10-26 ENCOUNTER — Ambulatory Visit (HOSPITAL_COMMUNITY)
Admission: RE | Admit: 2018-10-26 | Discharge: 2018-10-26 | Disposition: A | Discharge status: Home / Self Care | Payer: 59 | Source: Ambulatory Visit | Attending: Surgery | Admitting: Surgery

## 2018-10-26 ENCOUNTER — Ambulatory Visit (HOSPITAL_COMMUNITY): Payer: 59

## 2018-10-26 ENCOUNTER — Other Ambulatory Visit (HOSPITAL_COMMUNITY): Payer: Self-pay | Admitting: Surgery

## 2018-10-26 DIAGNOSIS — G08 Intracranial and intraspinal phlebitis and thrombophlebitis: Secondary | ICD-10-CM | POA: Insufficient documentation

## 2018-10-26 DIAGNOSIS — G902 Horner's syndrome: Secondary | ICD-10-CM | POA: Insufficient documentation

## 2018-10-26 MED ORDER — GADOBUTROL 1 MMOL/ML IV SOLN
10.0000 mL | Freq: Once | INTRAVENOUS | Status: AC | PRN
  Administered 2018-10-26: 9 mL via INTRAVENOUS

## 2018-11-09 DIAGNOSIS — H5711 Ocular pain, right eye: Secondary | ICD-10-CM | POA: Diagnosis not present

## 2018-11-09 DIAGNOSIS — H04123 Dry eye syndrome of bilateral lacrimal glands: Secondary | ICD-10-CM | POA: Diagnosis not present

## 2018-11-09 DIAGNOSIS — G08 Intracranial and intraspinal phlebitis and thrombophlebitis: Secondary | ICD-10-CM | POA: Diagnosis not present

## 2018-11-09 DIAGNOSIS — H16223 Keratoconjunctivitis sicca, not specified as Sjogren's, bilateral: Secondary | ICD-10-CM | POA: Diagnosis not present

## 2018-11-09 MED FILL — NEO/POLY/DEXAMET EYE OINT: 3.5-10000-0 | 10 days supply | Qty: 4 | Fill #0

## 2018-11-09 MED FILL — FLUOROMETHOLONE 0.1% DROPS: 0.1 | 50 days supply | Qty: 10 | Fill #0

## 2018-11-09 MED FILL — DOXYCYCLINE HYCLATE 100 MG: 100 | 30 days supply | Qty: 30 | Fill #0

## 2018-12-07 MED FILL — ALPRAZolam 0.5 MG TABS: 0.5 | 15 days supply | Qty: 30 | Fill #1

## 2019-01-02 ENCOUNTER — Other Ambulatory Visit: Payer: Self-pay | Admitting: Family Medicine

## 2019-01-02 NOTE — Telephone Encounter (Signed)
Last filled 09/21/19 #30 with 1 refills Last OV 03-02-18 CPE No Future Pender

## 2019-01-03 MED FILL — ALPRAZolam 0.5 MG TABS: 0.5 | 15 days supply | Qty: 30 | Fill #0

## 2019-03-01 MED FILL — ALPRAZolam 0.5 MG TABS: 0.5 | 15 days supply | Qty: 30 | Fill #1

## 2019-03-29 ENCOUNTER — Ambulatory Visit: Payer: 59 | Admitting: Obstetrics and Gynecology

## 2019-04-05 ENCOUNTER — Other Ambulatory Visit: Payer: Self-pay | Admitting: *Deleted

## 2019-04-05 ENCOUNTER — Other Ambulatory Visit: Payer: Self-pay | Admitting: Family Medicine

## 2019-04-05 MED ORDER — ALPRAZOLAM 0.5 MG PO TABS: 0.5000 mg | ORAL_TABLET | Freq: Two times a day (BID) | ORAL | 1 refills | Status: DC | PRN

## 2019-04-05 NOTE — Telephone Encounter (Signed)
Name of Medication: xanax 0.5mg  Name of Pharmacy: [facility] or Written Date and Quantity: 01/02/19 #30 tabs with 1 refill Last Office Visit and Type: CPE on 03/02/18 Next Office Visit and Type: none scheduled

## 2019-04-06 MED FILL — ALPRAZolam 0.5 MG TABS: 0.5 | 15 days supply | Qty: 30 | Fill #0

## 2019-05-04 MED FILL — ALPRAZolam 0.5 MG TABS: 0.5 | 15 days supply | Qty: 30 | Fill #1

## 2019-05-15 ENCOUNTER — Other Ambulatory Visit: Payer: Self-pay | Admitting: Family Medicine

## 2019-05-15 DIAGNOSIS — Z1231 Encounter for screening mammogram for malignant neoplasm of breast: Secondary | ICD-10-CM

## 2019-05-25 ENCOUNTER — Ambulatory Visit: Payer: 59 | Admitting: Obstetrics and Gynecology

## 2019-06-08 ENCOUNTER — Encounter: Payer: Self-pay | Admitting: Family Medicine

## 2019-06-12 ENCOUNTER — Ambulatory Visit: Payer: 59 | Admitting: Family Medicine

## 2019-06-14 ENCOUNTER — Other Ambulatory Visit: Payer: Self-pay

## 2019-06-16 ENCOUNTER — Telehealth: Payer: Self-pay | Admitting: Obstetrics and Gynecology

## 2019-06-16 ENCOUNTER — Ambulatory Visit (INDEPENDENT_AMBULATORY_CARE_PROVIDER_SITE_OTHER): Payer: 59 | Admitting: Obstetrics and Gynecology

## 2019-06-16 ENCOUNTER — Other Ambulatory Visit: Payer: Self-pay

## 2019-06-16 ENCOUNTER — Encounter: Payer: Self-pay | Admitting: Obstetrics and Gynecology

## 2019-06-16 VITALS — BP 122/78 | HR 92 | Temp 98.2°F | Resp 14 | Ht 62.0 in | Wt 187.6 lb

## 2019-06-16 DIAGNOSIS — N6310 Unspecified lump in the right breast, unspecified quadrant: Secondary | ICD-10-CM

## 2019-06-16 DIAGNOSIS — N951 Menopausal and female climacteric states: Secondary | ICD-10-CM

## 2019-06-16 DIAGNOSIS — Z01419 Encounter for gynecological examination (general) (routine) without abnormal findings: Secondary | ICD-10-CM

## 2019-06-16 NOTE — Telephone Encounter (Signed)
Spoke with Jamie Coleman at [facility]. Screening cancelled for 06/27/19. Bilateral Dx MMG and right breast US, if needed, scheduled for 06/21/19 at 8:40am, arrive at 8:20am.   Patient notified while in office. Patient is agreeable to date and time.   Routing to provider for final review. Patient is agreeable to disposition. Will close encounter.

## 2019-06-16 NOTE — Progress Notes (Signed)
48 y.o. G68P1002 Married Caucasian female here for annual exam.    Had one spot of blood last month for the first time in one year.  Prior to this she has spotting once or twice a year. Having real hot flashes.  Declines treatment.  No vaginal dryness.  Still has bilateral nipple discharge which is long standing.  Normal TSH and prolactin last year.   Doing meditation for stress relief.   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  Patient tested negative.   PCP: Loura Pardon, MD    Patient's last menstrual period was 05/31/2019 (within days).           Sexually active: Yes.    The current method of family planning is tubal ligation and ablation.    Exercising: Yes.    walking Smoker:  former  Health Maintenance: Pap:  03/21/18 Neg:Neg HR HPV, 07/27/14 - normal, neg HR HPV.  History of abnormal Pap:  Yes, colposcopy 2011 MMG:  04/04/18 Bilateral Diagnostic MM/Right Breast US -- BIRADS 1/density d -- scheduled 06/27/19 Colonoscopy:  none BMD:   n/a  Result  n/a TDaP:  Unsure.   Gardasil:   no HIV: negative in pregnancy Hep C: never Screening Labs: PCP   reports that she quit smoking about 35 years ago. She has never used smokeless tobacco. She reports current alcohol use of about 10.0 standard drinks of alcohol per week. She reports that she does not use drugs.  Past Medical History:  Diagnosis Date  . Allergy    allergic rhinitis  . Anxiety    situational  . Difficult intubation    states 2012 ablation surgery, had diff intubation,glide scope used, not noted in EPIC anesthesia record  . Elevated hemoglobin A1c 08/08/15  . Elevated liver function tests 08/08/15  . GERD (gastroesophageal reflux disease)   . Hyperlipidemia   . Low serum vitamin D 08/08/15  . Status post endometrial ablation   . Tennis elbow    currently in PT-10/16    Past Surgical History:  Procedure Laterality Date  . CESAREAN SECTION    . ENDOMETRIAL ABLATION     8/12  . EXTREMITY WIRE/PIN  REMOVAL Left 07/16/2016   Procedure: PIN REMOVAL OF LEFT WRIST X 2;  Surgeon: Roseanne Kaufman, MD;  Location: [facility];  Service: Orthopedics;  Laterality: Left;  . FOOT SURGERY    . LAPAROSCOPIC TUBAL LIGATION  06/09/2011   Procedure: LAPAROSCOPIC TUBAL LIGATION;  Surgeon: Arloa Koh;  Location: [facility];  Service: Gynecology;  Laterality: N/A;  . LIGAMENT REPAIR Left 05/08/2016   Procedure: LEFT WRIST SCAPHOLUNATE LIGAMENT RECONSTRUCTION WITH TENDON GRAFT PIN NEURECTOMY AND REPAIR;  Surgeon: Roseanne Kaufman, MD;  Location: [place];  Service: Orthopedics;  Laterality: Left;  . TONSILLECTOMY     as child  . TUBAL LIGATION  8/12    Current Outpatient Medications  Medication Sig Dispense Refill  . ALPRAZolam (XANAX) 0.5 MG tablet Take 1 tablet (0.5 mg total) by mouth 2 (two) times daily as needed. for anxiety 30 tablet 1  . Calcium Carbonate-Vit D-Min 600-400 MG-UNIT TABS Take 1 tablet by mouth 2 (two) times daily.     . Cholecalciferol (VITAMIN D3) 5000 units TABS Take 1 tablet by mouth every other day.    . famotidine (PEPCID) 20 MG tablet Take 20 mg by mouth 2 (two) times daily.    . fexofenadine (ALLEGRA) 180 MG tablet Take 180 mg by mouth daily.    Marland Kitchen  ibuprofen (ADVIL,MOTRIN) 800 MG tablet Take 1 tablet (800 mg total) by mouth every 8 (eight) hours as needed (with food). 90 tablet 2  . Multiple Vitamin (MULTIVITAMIN) capsule Take 1 capsule by mouth daily.      . Omega-3 Fatty Acids (FISH OIL PO) Take by mouth daily.       No current facility-administered medications for this visit.     Family History  Problem Relation Age of Onset  . Cancer Mother        CA insitu of appendix  . Heart disease Father        CAD  . Diabetes Father        type II  . Diabetes Brother        type II  . Diabetes Maternal Grandfather   . Diabetes Paternal Grandmother   . Diabetes Paternal Grandfather     Review of Systems  Constitutional: Negative.   HENT:  Negative.   Eyes: Negative.   Respiratory: Negative.   Cardiovascular: Negative.   Gastrointestinal: Negative.   Endocrine: Negative.   Genitourinary: Negative.   Musculoskeletal: Negative.   Skin: Negative.   Allergic/Immunologic: Negative.   Neurological: Negative.   Hematological: Negative.   Psychiatric/Behavioral: Negative.     Exam:   BP 122/78 (BP Location: Left Arm, Patient Position: Sitting, Cuff Size: Normal)   Pulse 92   Temp 98.2 F (36.8 C) (Temporal)   Resp 14   Ht 5\' 2"  (1.575 m)   Wt 187 lb 9.6 oz (85.1 kg)   LMP 05/31/2019 (Within Days) Comment: spotting  BMI 34.31 kg/m     General appearance: alert, cooperative and appears stated age Head: normocephalic, without obvious abnormality, atraumatic Neck: no adenopathy, supple, symmetrical, trachea midline and thyroid normal to inspection and palpation Lungs: clear to auscultation bilaterally Breasts: left - normal appearance, no masses or tenderness, No nipple retraction or dimpling, No nipple discharge or bleeding, No axillary adenopathy Right - 3 mm firm mass at 7:00 at edge of areola, nontender.  No nipple retraction or dimpling, No nipple discharge or bleeding, No axillary adenopathy Heart: regular rate and rhythm Abdomen: soft, non-tender; no masses, no organomegaly Extremities: extremities normal, atraumatic, no cyanosis or edema Skin: skin color, texture, turgor normal. No rashes or lesions Lymph nodes: cervical, supraclavicular, and axillary nodes normal. Neurologic: grossly normal  Pelvic: External genitalia:  no lesions              No abnormal inguinal nodes palpated.              Urethra:  normal appearing urethra with no masses, tenderness or lesions              Bartholins and Skenes: normal                 Vagina: normal appearing vagina with normal color and discharge, no lesions              Cervix: no lesions              Pap taken: No. Bimanual Exam:  Uterus:  normal size, contour,  position, consistency, mobility, non-tender              Adnexa: no mass, fullness, tenderness              Rectal exam: Yes.  .  Confirms.              Anus:  normal sphincter tone, no lesions  Chaperone was  present for exam.  Assessment:   Well woman visit with normal exam. Status post BTL and endometrial ablation.  Menopausal symptoms.  Right breast mass. Bilateral nipple discharge. Normal prolactin and TSH.  Hx elevated prolactin many years ago.   Plan: Mammogram bilateral dx and right breast US.  Self breast awareness reviewed. Pap and HR HPV as above. Guidelines for Calcium, Vitamin D, regular exercise program including cardiovascular and weight bearing exercise. [place] and E2.  Labs with PCP.    Follow up annually and prn.   After visit summary provided.

## 2019-06-16 NOTE — Telephone Encounter (Signed)
Patient has a right breast mass, 3 mm at edge of areola at 7:00.   [facility] to a dx mammogram and right breast US.

## 2019-06-17 LAB — FOLLICLE STIMULATING HORMONE: FSH: 4.4 m[IU]/mL

## 2019-06-17 LAB — ESTRADIOL: Estradiol: 147 pg/mL

## 2019-06-21 ENCOUNTER — Other Ambulatory Visit: Payer: 59

## 2019-06-27 ENCOUNTER — Ambulatory Visit
Admission: RE | Admit: 2019-06-27 | Discharge: 2019-06-27 | Disposition: A | Discharge status: Home / Self Care | Payer: 59 | Source: Ambulatory Visit | Attending: Obstetrics and Gynecology | Admitting: Obstetrics and Gynecology

## 2019-06-27 ENCOUNTER — Other Ambulatory Visit: Payer: Self-pay

## 2019-06-27 ENCOUNTER — Ambulatory Visit: Payer: 59

## 2019-06-27 DIAGNOSIS — N6041 Mammary duct ectasia of right breast: Secondary | ICD-10-CM | POA: Diagnosis not present

## 2019-06-27 DIAGNOSIS — N6310 Unspecified lump in the right breast, unspecified quadrant: Secondary | ICD-10-CM

## 2019-06-27 DIAGNOSIS — R928 Other abnormal and inconclusive findings on diagnostic imaging of breast: Secondary | ICD-10-CM | POA: Diagnosis not present

## 2019-08-25 ENCOUNTER — Other Ambulatory Visit: Payer: Self-pay | Admitting: Family Medicine

## 2019-08-25 MED FILL — ALPRAZolam 0.5 MG TABS: 0.5 | 15 days supply | Qty: 30 | Fill #0

## 2019-08-25 NOTE — Telephone Encounter (Signed)
Please schedule a f/u in the next several mo or so Thanks

## 2019-08-25 NOTE — Telephone Encounter (Signed)
Name of Medication: xanax 0.5mg  Name of Pharmacy: [facility] or Written Date and Quantity: 04/05/19 #30 tabs with 1 refill Last Office Visit and Type: CPE on 03/02/18 (over a year) Next Office Visit and Type: none scheduled

## 2019-08-30 ENCOUNTER — Ambulatory Visit (INDEPENDENT_AMBULATORY_CARE_PROVIDER_SITE_OTHER): Payer: 59 | Admitting: Family Medicine

## 2019-08-30 ENCOUNTER — Other Ambulatory Visit: Payer: Self-pay

## 2019-08-30 ENCOUNTER — Encounter: Payer: Self-pay | Admitting: Family Medicine

## 2019-08-30 VITALS — BP 130/80 | HR 88 | Temp 96.9°F | Ht 62.0 in | Wt 184.5 lb

## 2019-08-30 DIAGNOSIS — L089 Local infection of the skin and subcutaneous tissue, unspecified: Secondary | ICD-10-CM | POA: Insufficient documentation

## 2019-08-30 DIAGNOSIS — F413 Other mixed anxiety disorders: Secondary | ICD-10-CM | POA: Diagnosis not present

## 2019-08-30 DIAGNOSIS — E559 Vitamin D deficiency, unspecified: Secondary | ICD-10-CM

## 2019-08-30 DIAGNOSIS — R5382 Chronic fatigue, unspecified: Secondary | ICD-10-CM | POA: Diagnosis not present

## 2019-08-30 DIAGNOSIS — E7800 Pure hypercholesterolemia, unspecified: Secondary | ICD-10-CM

## 2019-08-30 DIAGNOSIS — R7303 Prediabetes: Secondary | ICD-10-CM

## 2019-08-30 MED ORDER — MUPIROCIN 2 % EX OINT: 1.0000 "application " | TOPICAL_OINTMENT | Freq: Two times a day (BID) | CUTANEOUS | 0 refills | Status: DC

## 2019-08-30 MED ORDER — SULFAMETHOXAZOLE-TRIMETHOPRIM 800-160 MG PO TABS: 1.0000 | ORAL_TABLET | Freq: Two times a day (BID) | ORAL | 0 refills | Status: DC

## 2019-08-30 MED FILL — MUPIROCIN 2% OINTMENT: 2 | 10 days supply | Qty: 22 | Fill #0

## 2019-08-30 MED FILL — SULFAMETHOXAZOLE-TMP DS TAB: 800-160 | 7 days supply | Qty: 14 | Fill #0

## 2019-08-30 NOTE — Assessment & Plan Note (Addendum)
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

## 2019-08-30 NOTE — Assessment & Plan Note (Signed)
Lipid panel today  Diet fair Eating out less Disc goals for lipids and reasons to control them Rev last labs with pt Rev low sat fat diet in detail

## 2019-08-30 NOTE — Assessment & Plan Note (Signed)
TSH today with labs  Suspect due to rigorous work schedule during pandemic

## 2019-08-30 NOTE — Patient Instructions (Addendum)
I sent bactrim and bactroban to your pharmacy Labs today  If skin redness/ infection worsens or does not improve   Keep areas clean with soap and water at least twice daily   Take care of yourself !

## 2019-08-30 NOTE — Assessment & Plan Note (Signed)
D level today  Some fatigue  No falls or fractures

## 2019-08-30 NOTE — Assessment & Plan Note (Signed)
Lab Results  Component Value Date   HGBA1C 5.8 (H) 08/14/2015   Re check today  Eating out less

## 2019-08-30 NOTE — Assessment & Plan Note (Signed)
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

## 2019-08-30 NOTE — Progress Notes (Signed)
Subjective:    Patient ID: Jamie Coleman, female    DOB: 12-16-1970, 48 y.o.   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  Wearing mask 50 hours per week- this makes it worse   She started using rubbing alcohol  Also neosporin     Wt Readings from Last 3 Encounters:  08/30/19 184 lb 8 oz (83.7 kg)  06/16/19 187 lb 9.6 oz (85.1 kg)  03/21/18 193 lb (87.5 kg)  she lost down to 180 and then gained some back  She is eating less in general and does not eat sweets any more  Also walking more  33.75 kg/m     BP Readings from Last 3 Encounters:  08/30/19 (!) 146/92  06/16/19 122/78  03/21/18 116/68  bp improved on 2nd check 130/80  Pulse Readings from Last 3 Encounters:  08/30/19 88  06/16/19 92  03/21/18 88   H/o anxiety  Takes xanax prn  Refilled 11/13 with a refill  Doing ok   Prediabetes  Lab Results  Component Value Date   HGBA1C 5.8 (H) 08/14/2015   Vit D def in the past   Had her well woman exam with gyn in sept  Wants to check cholesterol today  Lab Results  Component Value Date   CHOL 241 (H) 08/14/2015   HDL 58 08/14/2015   LDLCALC 128 08/14/2015   LDLDIRECT 87.8 12/01/2012   TRIG 273 (H) 08/14/2015   CHOLHDL 4.2 08/14/2015   Eating out less  Patient Active Problem List   Diagnosis Date Noted  . Skin infection 08/30/2019  . Vitamin D deficiency 03/02/2018  . Fatigue 03/02/2018  . Pupil asymmetry 08/04/2016  . Hemorrhoids 12/28/2014  . Prediabetes 08/28/2014  . Rash and nonspecific skin eruption 08/26/2014  . Tremor 06/30/2013  . Sebaceous cyst 02/15/2013  .  OTHER&UNSPECIFIED DISEASES THE ORAL SOFT TISSUES 07/25/2008  . DERMATOFIBROMA, ARM 11/11/2007  . NEOPLASM, SKIN, UNCERTAIN BEHAVIOR [id number]  . Hyperlipidemia 02/10/2007  . Anxiety disorder 02/10/2007  . ALLERGIC RHINITIS 02/10/2007  . GERD 02/10/2007  . IBS 02/10/2007   Past Medical History:  Diagnosis Date  . Allergy    allergic rhinitis  . Anxiety    situational  . Difficult intubation    states 2012 ablation surgery, had diff intubation,glide scope used, not noted in EPIC anesthesia record  . Elevated hemoglobin A1c 08/08/15  . Elevated liver function tests 08/08/15  . GERD (gastroesophageal reflux disease)   . Hyperlipidemia   . Low serum vitamin D 08/08/15  . Status post endometrial ablation   . Tennis elbow    currently in PT-10/16   Past Surgical History:  Procedure Laterality Date  . CESAREAN SECTION    . ENDOMETRIAL ABLATION     8/12  . EXTREMITY WIRE/PIN REMOVAL Left 07/16/2016   Procedure: PIN REMOVAL OF LEFT WRIST X 2;  Surgeon: Roseanne Kaufman, MD;  Location: [place];  Service: Orthopedics;  Laterality: Left;  . FOOT SURGERY    . LAPAROSCOPIC  TUBAL LIGATION  06/09/2011   Procedure: LAPAROSCOPIC TUBAL LIGATION;  Surgeon: Arloa Koh;  Location: [facility];  Service: Gynecology;  Laterality: N/A;  . LIGAMENT REPAIR Left 05/08/2016   Procedure: LEFT WRIST SCAPHOLUNATE LIGAMENT RECONSTRUCTION WITH TENDON GRAFT PIN NEURECTOMY AND REPAIR;  Surgeon: Roseanne Kaufman, MD;  Location: [place];  Service: Orthopedics;  Laterality: Left;  . TONSILLECTOMY     as child  . TUBAL LIGATION  8/12   Social History   Tobacco Use  . Smoking status: Former Smoker    Quit date: 07/25/1983    Years since quitting: 36.1  . Smokeless tobacco: Never Used  . Tobacco comment: in high school  Substance Use Topics  . Alcohol use: Yes    Alcohol/week: 10.0 standard drinks    Types: 10 Standard drinks or equivalent per week    Comment: 7-10 drinks a  week (alcohol, wine, or beer)/ daily  . Drug use: No   Family History  Problem Relation Age of Onset  . Cancer Mother        CA insitu of appendix  . Heart disease Father        CAD  . Diabetes Father        type II  . Diabetes Brother        type II  . Diabetes Maternal Grandfather   . Diabetes Paternal Grandmother   . Diabetes Paternal Grandfather    Allergies  Allergen Reactions  . Lipitor [Atorvastatin]     myalgias   Current Outpatient Medications on File Prior to Visit  Medication Sig Dispense Refill  . ALPRAZolam (XANAX) 0.5 MG tablet TAKE 1 TABLET BY MOUTH TWO TIMES DAILY AS NEEDED FOR ANXIETY 30 tablet 1  . Ascorbic Acid (VITAMIN C) 1000 MG tablet Take 1,000 mg by mouth daily.    . Calcium Carbonate-Vit D-Min 600-400 MG-UNIT TABS Take 1 tablet by mouth 2 (two) times daily.     . Cholecalciferol (VITAMIN D3) 5000 units TABS Take 1 tablet by mouth every other day.    . famotidine (PEPCID) 20 MG tablet Take 20 mg by mouth 2 (two) times daily.    . fexofenadine (ALLEGRA) 180 MG tablet Take 180 mg by mouth daily.    Marland Kitchen ibuprofen (ADVIL,MOTRIN) 800 MG tablet Take 1 tablet (800 mg total) by mouth every 8 (eight) hours as needed (with food). 90 tablet 2  . Multiple Vitamin (MULTIVITAMIN) capsule Take 1 capsule by mouth daily.      . Omega-3 Fatty Acids (FISH OIL PO) Take by mouth daily.       No current facility-administered medications on file prior to visit.     Review of Systems  Constitutional: Positive for fatigue. Negative for activity change, appetite change, fever and unexpected weight change.  HENT: Negative for congestion, ear pain, rhinorrhea, sinus pressure and sore throat.   Eyes: Negative for pain, redness and visual disturbance.  Respiratory: Negative for cough, shortness of breath and wheezing.   Cardiovascular: Negative for chest pain and palpitations.  Gastrointestinal: Negative for abdominal pain, blood in stool, constipation and diarrhea.  Endocrine:  Negative for polydipsia and polyuria.  Genitourinary: Negative for dysuria, frequency and urgency.  Musculoskeletal: Negative for arthralgias, back pain and myalgias.  Skin: Positive for color change. Negative for pallor and rash.  Allergic/Immunologic: Negative for environmental allergies.  Neurological: Negative for dizziness, syncope and headaches.  Hematological: Negative for adenopathy. Does not bruise/bleed easily.  Psychiatric/Behavioral: Negative for decreased concentration and dysphoric mood.  The patient is not nervous/anxious.        Objective:   Physical Exam Constitutional:      General: She is not in acute distress.    Appearance: Normal appearance. She is well-developed. She is obese. She is not ill-appearing or diaphoretic.  HENT:     Head: Normocephalic and atraumatic.     Right Ear: Tympanic membrane and ear canal normal.     Left Ear: Tympanic membrane and ear canal normal.     Nose: No congestion or rhinorrhea.     Mouth/Throat:     Mouth: Mucous membranes are moist.  Eyes:     General: No scleral icterus.    Conjunctiva/sclera: Conjunctivae normal.     Pupils: Pupils are equal, round, and reactive to light.  Neck:     Musculoskeletal: Normal range of motion and neck supple.     Thyroid: No thyromegaly.     Vascular: No carotid bruit or JVD.  Cardiovascular:     Rate and Rhythm: Normal rate and regular rhythm.     Heart sounds: Normal heart sounds. No gallop.   Pulmonary:     Effort: Pulmonary effort is normal. No respiratory distress.     Breath sounds: Normal breath sounds. No wheezing or rales.  Abdominal:     General: Bowel sounds are normal. There is no distension or abdominal bruit.     Palpations: Abdomen is soft. There is no mass.     Tenderness: There is no abdominal tenderness.  Musculoskeletal:     Comments: No kyphosis   Lymphadenopathy:     Cervical: No cervical adenopathy.  Skin:    General: Skin is warm and dry.     Findings: No rash.      Comments: Erythema and mild swelling of skin around and under L nare (slight scale/no drainage or crust) Oval area of erythema similar in appearance over L clavicle    Neurological:     Mental Status: She is alert. Mental status is at baseline.     Cranial Nerves: No cranial nerve deficit.     Coordination: Coordination normal.     Deep Tendon Reflexes: Reflexes are normal and symmetric. Reflexes normal.  Psychiatric:        Mood and Affect: Mood normal.           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 Clean with soap and water  Update if not starting to improve in a week or if worsening        Relevant Medications   sulfamethoxazole-trimethoprim (BACTRIM DS) 800-160 MG tablet   mupirocin ointment (BACTROBAN) 2 %     Other   Hyperlipidemia    Lipid panel today  Diet fair Eating out less Disc goals for lipids and reasons to control them Rev last labs with pt Rev low sat fat diet in detail       Relevant Orders   Comprehensive metabolic panel   Lipid panel   Anxiety disorder    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

## 2019-08-31 LAB — COMPREHENSIVE METABOLIC PANEL
ALT: 84 U/L — ABNORMAL HIGH (ref 0–35)
AST: 126 U/L — ABNORMAL HIGH (ref 0–37)
Albumin: 4.2 g/dL (ref 3.5–5.2)
Alkaline Phosphatase: 71 U/L (ref 39–117)
BUN: 12 mg/dL (ref 6–23)
CO2: 28 mEq/L (ref 19–32)
Calcium: 9.2 mg/dL (ref 8.4–10.5)
Chloride: 100 mEq/L (ref 96–112)
Creatinine, Ser: 0.74 mg/dL (ref 0.40–1.20)
GFR: 83.15 mL/min (ref >60.00)
Glucose, Bld: 103 mg/dL — ABNORMAL HIGH (ref 70–99)
Potassium: 3.7 mEq/L (ref 3.5–5.1)
Sodium: 138 mEq/L (ref 135–145)
Total Bilirubin: 1 mg/dL (ref 0.2–1.2)
Total Protein: 7 g/dL (ref 6.0–8.3)

## 2019-08-31 LAB — LIPID PANEL
Cholesterol: 251 mg/dL — ABNORMAL HIGH (ref 0–200)
HDL: 73.4 mg/dL (ref >39.00)
LDL Cholesterol: 158 mg/dL — ABNORMAL HIGH (ref 0–99)
NonHDL: 177.59
Total CHOL/HDL Ratio: 3
Triglycerides: 96 mg/dL (ref 0.0–149.0)
VLDL: 19.2 mg/dL (ref 0.0–40.0)

## 2019-08-31 LAB — TSH: TSH: 5.66 u[IU]/mL — ABNORMAL HIGH (ref 0.35–4.50)

## 2019-08-31 LAB — VITAMIN D 25 HYDROXY (VIT D DEFICIENCY, FRACTURES): VITD: 62.42 ng/mL (ref 30.00–100.00)

## 2019-08-31 LAB — HEMOGLOBIN A1C: Hgb A1c MFr Bld: 5.6 % (ref 4.6–6.5)

## 2019-09-03 ENCOUNTER — Telehealth: Payer: Self-pay | Admitting: Family Medicine

## 2019-09-03 DIAGNOSIS — E038 Other specified hypothyroidism: Secondary | ICD-10-CM | POA: Insufficient documentation

## 2019-09-03 DIAGNOSIS — R7989 Other specified abnormal findings of blood chemistry: Secondary | ICD-10-CM | POA: Insufficient documentation

## 2019-09-03 DIAGNOSIS — R7401 Elevation of levels of liver transaminase levels: Secondary | ICD-10-CM | POA: Insufficient documentation

## 2019-09-03 NOTE — Telephone Encounter (Signed)
-----   Message from Tammi Sou, [place] sent at 09/01/2019 12:51 PM EST ----- Pt did view lab results on mychart. Pt said when the pandemic/ quarantine 1st started pt was drinking daily. Due to work and having to do more during the week she has slowed down and only drinks on weekends but she is drinking about 3-4 drinks a day during the weekend. Pt also said she isn't taking tylenol on a regular maybe just once a week at the most if she gets a HA. Pt said she will cut back on her etoh use and not take any tylenol.  Pt schedule a 2 week f/u lab appt on 09/18/19 to recheck everything. Pt also asked if we could do the f/u thyroid labs that Dr. Glori Bickers talked about on mychart on that day as well since they were not added to theses labs

## 2019-09-03 NOTE — Telephone Encounter (Signed)
That is fine I ordered it all

## 2019-09-18 ENCOUNTER — Other Ambulatory Visit: Payer: Self-pay

## 2019-09-18 ENCOUNTER — Other Ambulatory Visit: Payer: 59

## 2019-09-26 ENCOUNTER — Other Ambulatory Visit: Payer: 59

## 2019-10-24 MED FILL — ALPRAZolam 0.5 MG TABS: 0.5 | 15 days supply | Qty: 30 | Fill #1

## 2020-02-26 ENCOUNTER — Other Ambulatory Visit: Payer: Self-pay | Admitting: *Deleted

## 2020-02-26 ENCOUNTER — Other Ambulatory Visit: Payer: Self-pay | Admitting: Family Medicine

## 2020-02-26 MED FILL — ALPRAZolam 0.5 MG TABS: 0.5 | 15 days supply | Qty: 30 | Fill #0

## 2020-03-13 DIAGNOSIS — G902 Horner's syndrome: Secondary | ICD-10-CM | POA: Diagnosis not present

## 2020-03-13 DIAGNOSIS — H04123 Dry eye syndrome of bilateral lacrimal glands: Secondary | ICD-10-CM | POA: Diagnosis not present

## 2020-03-13 DIAGNOSIS — H524 Presbyopia: Secondary | ICD-10-CM | POA: Diagnosis not present

## 2020-03-13 DIAGNOSIS — E119 Type 2 diabetes mellitus without complications: Secondary | ICD-10-CM | POA: Diagnosis not present

## 2020-03-13 LAB — HM DIABETES EYE EXAM

## 2020-03-13 MED FILL — FLUOROMETHOLONE 0.1% DROPS: 0.1 | 33 days supply | Qty: 10 | Fill #0

## 2020-03-15 DIAGNOSIS — Z1283 Encounter for screening for malignant neoplasm of skin: Secondary | ICD-10-CM | POA: Diagnosis not present

## 2020-03-15 DIAGNOSIS — D225 Melanocytic nevi of trunk: Secondary | ICD-10-CM | POA: Diagnosis not present

## 2020-03-15 DIAGNOSIS — D2271 Melanocytic nevi of right lower limb, including hip: Secondary | ICD-10-CM | POA: Diagnosis not present

## 2020-03-21 ENCOUNTER — Encounter: Payer: Self-pay | Admitting: Family Medicine

## 2020-06-13 MED FILL — ALPRAZolam 0.5 MG TABS: 0.5 | 15 days supply | Qty: 30 | Fill #1

## 2020-06-13 NOTE — Progress Notes (Deleted)
49 y.o. G71P1002 Married Caucasian female here for annual exam.    PCP:     No LMP recorded. Patient has had an ablation.           Sexually active: {yes no:314532}  The current method of family planning is tubal ligation.    Exercising: {yes LK:[id number]}  {types:19826} Smoker:  Former  Health Maintenance: Pap: 03/21/18 Neg:Neg HR HPV, 07/27/14 Neg: neg HR HPV, 07-25-13 Neg:Neg HR HPV History of abnormal Pap:  Yes, Hx of colposcopy 2011 MMG:  06-27-19 Bil.Diag./Neg/density B/BiRads2/screening 1 yr. Colonoscopy:  *** BMD:   ***  Result  *** TDaP:  ***01-14-05 Td Gardasil:   no HIV:Neg in pregnancy Hep C:*** Screening Labs:  Hb today: ***, Urine today: ***   reports that she quit smoking about 36 years ago. She has never used smokeless tobacco. She reports current alcohol use of about 10.0 standard drinks of alcohol per week. She reports that she does not use drugs.  Past Medical History:  Diagnosis Date  . Allergy    allergic rhinitis  . Anxiety    situational  . Difficult intubation    states 2012 ablation surgery, had diff intubation,glide scope used, not noted in EPIC anesthesia record  . Elevated hemoglobin A1c 08/08/15  . Elevated liver function tests 08/08/15  . GERD (gastroesophageal reflux disease)   . Hyperlipidemia   . Low serum vitamin D 08/08/15  . Status post endometrial ablation   . Tennis elbow    currently in PT-10/16    Past Surgical History:  Procedure Laterality Date  . CESAREAN SECTION    . ENDOMETRIAL ABLATION     8/12  . EXTREMITY WIRE/PIN REMOVAL Left 07/16/2016   Procedure: PIN REMOVAL OF LEFT WRIST X 2;  Surgeon: Roseanne Kaufman, MD;  Location: [place] [facility];  Service: Orthopedics;  Laterality: Left;  . FOOT SURGERY    . LAPAROSCOPIC TUBAL LIGATION  06/09/2011   Procedure: LAPAROSCOPIC TUBAL LIGATION;  Surgeon: Arloa Koh;  Location: [facility];  Service: Gynecology;  Laterality: N/A;  . LIGAMENT REPAIR Left 05/08/2016   Procedure: LEFT  WRIST SCAPHOLUNATE LIGAMENT RECONSTRUCTION WITH TENDON GRAFT PIN NEURECTOMY AND REPAIR;  Surgeon: Roseanne Kaufman, MD;  Location: [facility];  Service: Orthopedics;  Laterality: Left;  . TONSILLECTOMY     as child  . TUBAL LIGATION  8/12    Current Outpatient Medications  Medication Sig Dispense Refill  . ALPRAZolam (XANAX) 0.5 MG tablet TAKE 1 TABLET BY MOUTH TWICE DAILY AS NEEDED FOR ANXIETY 30 tablet 1  . Ascorbic Acid (VITAMIN C) 1000 MG tablet Take 1,000 mg by mouth daily.    . Calcium Carbonate-Vit D-Min 600-400 MG-UNIT TABS Take 1 tablet by mouth 2 (two) times daily.     . Cholecalciferol (VITAMIN D3) 5000 units TABS Take 1 tablet by mouth every other day.    . famotidine (PEPCID) 20 MG tablet Take 20 mg by mouth 2 (two) times daily.    . fexofenadine (ALLEGRA) 180 MG tablet Take 180 mg by mouth daily.    Marland Kitchen ibuprofen (ADVIL,MOTRIN) 800 MG tablet Take 1 tablet (800 mg total) by mouth every 8 (eight) hours as needed (with food). 90 tablet 2  . Multiple Vitamin (MULTIVITAMIN) capsule Take 1 capsule by mouth daily.      . mupirocin ointment (BACTROBAN) 2 % Apply 1 application topically 2 (two) times daily. To affected area 15 g 0  . Omega-3 Fatty Acids (FISH OIL PO) Take by mouth  daily.      . sulfamethoxazole-trimethoprim (BACTRIM DS) 800-160 MG tablet Take 1 tablet by mouth 2 (two) times daily. 14 tablet 0   No current facility-administered medications for this visit.    Family History  Problem Relation Age of Onset  . Cancer Mother        CA insitu of appendix  . Heart disease Father        CAD  . Diabetes Father        type II  . Diabetes Brother        type II  . Diabetes Maternal Grandfather   . Diabetes Paternal Grandmother   . Diabetes Paternal Grandfather     Review of Systems  Exam:   There were no vitals taken for this visit.    General appearance: alert, cooperative and appears stated age Head: normocephalic, without obvious abnormality,  atraumatic Neck: no adenopathy, supple, symmetrical, trachea midline and thyroid normal to inspection and palpation Lungs: clear to auscultation bilaterally Breasts: normal appearance, no masses or tenderness, No nipple retraction or dimpling, No nipple discharge or bleeding, No axillary adenopathy Heart: regular rate and rhythm Abdomen: soft, non-tender; no masses, no organomegaly Extremities: extremities normal, atraumatic, no cyanosis or edema Skin: skin color, texture, turgor normal. No rashes or lesions Lymph nodes: cervical, supraclavicular, and axillary nodes normal. Neurologic: grossly normal  Pelvic: External genitalia:  no lesions              No abnormal inguinal nodes palpated.              Urethra:  normal appearing urethra with no masses, tenderness or lesions              Bartholins and Skenes: normal                 Vagina: normal appearing vagina with normal color and discharge, no lesions              Cervix: no lesions              Pap taken: {yes no:314532} Bimanual Exam:  Uterus:  normal size, contour, position, consistency, mobility, non-tender              Adnexa: no mass, fullness, tenderness              Rectal exam: {yes no:314532}.  Confirms.              Anus:  normal sphincter tone, no lesions  Chaperone was present for exam.  Assessment:   Well woman visit with normal exam.   Plan: Mammogram screening discussed. Self breast awareness reviewed. Pap and HR HPV as above. Guidelines for Calcium, Vitamin D, regular exercise program including cardiovascular and weight bearing exercise.   Follow up annually and prn.   Additional counseling given.  {yes [id number]. _______ minutes face to face time of which over 50% was spent in counseling.    After visit summary provided.

## 2020-06-14 MED FILL — FLUOROMETHOLONE 0.1% DROPS: 0.1 | 33 days supply | Qty: 10 | Fill #1

## 2020-06-18 ENCOUNTER — Telehealth: Payer: Self-pay | Admitting: Obstetrics and Gynecology

## 2020-06-18 ENCOUNTER — Ambulatory Visit: Payer: 59 | Admitting: Obstetrics and Gynecology

## 2020-06-18 NOTE — Telephone Encounter (Signed)
I spoke with patient to reschedule her aex today due to md being delayed in surgery. To triage to assist with rescheduling. Patient declined appointments offered for tomorrow.

## 2020-06-18 NOTE — Telephone Encounter (Signed)
Spoke with pt. Pt needing to reschedule AEX. Pt scheduled with Dr Quincy Simmonds on 9/28/ at 430 pm. Pt agreeable to date and time of appt.  Encounter closed.

## 2020-07-09 ENCOUNTER — Encounter: Payer: Self-pay | Admitting: Obstetrics and Gynecology

## 2020-07-09 ENCOUNTER — Telehealth: Payer: Self-pay | Admitting: Obstetrics and Gynecology

## 2020-07-09 ENCOUNTER — Ambulatory Visit: Payer: 59 | Admitting: Obstetrics and Gynecology

## 2020-07-09 ENCOUNTER — Other Ambulatory Visit: Payer: Self-pay

## 2020-07-09 VITALS — BP 130/72 | HR 88 | Resp 16 | Ht 61.25 in | Wt 178.0 lb

## 2020-07-09 DIAGNOSIS — Z01419 Encounter for gynecological examination (general) (routine) without abnormal findings: Secondary | ICD-10-CM

## 2020-07-09 DIAGNOSIS — Z0000 Encounter for general adult medical examination without abnormal findings: Secondary | ICD-10-CM

## 2020-07-09 DIAGNOSIS — Z1211 Encounter for screening for malignant neoplasm of colon: Secondary | ICD-10-CM | POA: Diagnosis not present

## 2020-07-09 DIAGNOSIS — N6310 Unspecified lump in the right breast, unspecified quadrant: Secondary | ICD-10-CM

## 2020-07-09 NOTE — Telephone Encounter (Signed)
Please schedule bilateral dx mammogram and right breast ultrasound at [facility].   She has a right breast mass 3 mm at areolar border at 9:00.

## 2020-07-09 NOTE — Progress Notes (Signed)
49 y.o. G57P1002 Married Caucasian female here for annual exam.    Hx of ablation--she had spotting last week. Her last spotting was August 2020.  Hot all the time and thinks she is having hot flashes.   E2 147.0 and FSH 4.4 on 06/16/19.  Received her Covid vaccine.   PCP:  Loura Pardon, MD   No LMP recorded. Patient has had an ablation.           Sexually active: Yes.    The current method of family planning is tubal ligation/ablation.    Exercising: Yes.    walking Smoker:  former  Health Maintenance: Pap: 03/21/18 Neg:Neg HR HPV, 07-27-14 Neg:Neg HR HPV, 07-25-13 Neg:Neg HR HPV History of abnormal Pap:  Yes, Hx of LEEP, done prior to her ablation period.  MMG: 06-27-19 Diag.Bil w.Rt.US/Neg/pt.feeling mildly dilated milk duct/BiRads2 Colonoscopy:  Never BMD:   n/a  Result  n/a TDaP: 2006--will do with PCP Gardasil:   no HIV:Neg in pregnancy Hep C:never Screening Labs:  PCP.    reports that she quit smoking about 36 years ago. She has never used smokeless tobacco. She reports current alcohol use of about 14.0 standard drinks of alcohol per week. She reports that she does not use drugs.  Past Medical History:  Diagnosis Date  . Allergy    allergic rhinitis  . Anxiety    situational  . Difficult intubation    states 2012 ablation surgery, had diff intubation,glide scope used, not noted in EPIC anesthesia record  . Elevated hemoglobin A1c 08/08/15  . Elevated liver function tests 08/08/15  . GERD (gastroesophageal reflux disease)   . Hyperlipidemia   . Low serum vitamin D 08/08/15  . Status post endometrial ablation   . Tennis elbow    currently in PT-10/16    Past Surgical History:  Procedure Laterality Date  . CESAREAN SECTION    . ENDOMETRIAL ABLATION     8/12  . EXTREMITY WIRE/PIN REMOVAL Left 07/16/2016   Procedure: PIN REMOVAL OF LEFT WRIST X 2;  Surgeon: Roseanne Kaufman, MD;  Location: [facility];  Service: Orthopedics;  Laterality: Left;  . FOOT  SURGERY    . LAPAROSCOPIC TUBAL LIGATION  06/09/2011   Procedure: LAPAROSCOPIC TUBAL LIGATION;  Surgeon: Arloa Koh;  Location: [facility];  Service: Gynecology;  Laterality: N/A;  . LIGAMENT REPAIR Left 05/08/2016   Procedure: LEFT WRIST SCAPHOLUNATE LIGAMENT RECONSTRUCTION WITH TENDON GRAFT PIN NEURECTOMY AND REPAIR;  Surgeon: Roseanne Kaufman, MD;  Location: [place];  Service: Orthopedics;  Laterality: Left;  . TONSILLECTOMY     as child  . TUBAL LIGATION  8/12    Current Outpatient Medications  Medication Sig Dispense Refill  . ALPRAZolam (XANAX) 0.5 MG tablet TAKE 1 TABLET BY MOUTH TWICE DAILY AS NEEDED FOR ANXIETY 30 tablet 1  . Ascorbic Acid (VITAMIN C) 1000 MG tablet Take 1,000 mg by mouth daily.    . Calcium Carbonate-Vit D-Min 600-400 MG-UNIT TABS Take 1 tablet by mouth 2 (two) times daily.     . Cholecalciferol (VITAMIN D3) 5000 units TABS Take 1 tablet by mouth every other day.    . fexofenadine (ALLEGRA) 180 MG tablet Take 180 mg by mouth daily.    . fluorometholone (FML) 0.1 % ophthalmic suspension SMARTSIG:1 Drop(s) In Eye(s) 3 Times Daily PRN    . ibuprofen (ADVIL,MOTRIN) 800 MG tablet Take 1 tablet (800 mg total) by mouth every 8 (eight) hours as needed (with food). 90 tablet 2  .  Multiple Vitamin (MULTIVITAMIN) capsule Take 1 capsule by mouth daily.      . Omega-3 Fatty Acids (FISH OIL PO) Take by mouth daily.      Marland Kitchen omeprazole (PRILOSEC) 20 MG capsule Take 20 mg by mouth daily.     No current facility-administered medications for this visit.    Family History  Problem Relation Age of Onset  . Cancer Mother        CA insitu of appendix  . Heart disease Father        CAD  . Diabetes Father        type II  . Diabetes Brother        type II  . Diabetes Maternal Grandfather   . Diabetes Paternal Grandmother   . Diabetes Paternal Grandfather     Review of Systems  All other systems reviewed and are negative.   Exam:   BP 130/72   Pulse 88    Resp 16   Ht 5' 1.25" (1.556 m)   Wt 178 lb (80.7 kg)   BMI 33.36 kg/m     General appearance: alert, cooperative and appears stated age Head: normocephalic, without obvious abnormality, atraumatic Neck: no adenopathy, supple, symmetrical, trachea midline and thyroid normal to inspection and palpation Lungs: clear to auscultation bilaterally Breasts: left - normal appearance, no masses or tenderness, No nipple retraction or dimpling, No nipple discharge or bleeding, No axillary or supraclavicular adenopathy. Right - normal appearance, 3 mm at 8:00 right areolar margin.   No nipple retraction or dimpling, No nipple discharge or bleeding, No axillary or supraclavicular adenopathy. Heart: regular rate and rhythm Abdomen: soft, non-tender; no masses, no organomegaly Extremities: extremities normal, atraumatic, no cyanosis or edema Skin: skin color, texture, turgor normal. No rashes or lesions Lymph nodes: cervical, supraclavicular, and axillary nodes normal. Neurologic: grossly normal  Pelvic: External genitalia:  no lesions              No abnormal inguinal nodes palpated.              Urethra:  normal appearing urethra with no masses, tenderness or lesions              Bartholins and Skenes: normal                 Vagina: normal appearing vagina with normal color and discharge, no lesions              Cervix: no lesions              Pap taken: No. Bimanual Exam:  Uterus:  normal size, contour, position, consistency, mobility, non-tender              Adnexa: no mass, fullness, tenderness              Rectal exam: Yes.  .  Confirms.              Anus:  normal sphincter tone, no lesions  Chaperone was present for exam.  Assessment:   Well woman visit with normal exam. Status post BTL and endometrial ablation.  Menopausal symptoms.  Right breast mass. History of bilateral nipple discharge.Normal prolactin and TSH.  Hx elevated prolactin many years ago.  Right breast mass 3 mm at  8:00 right areolar margin.   Plan: Mammogram dx bilateral and right breast US.  Self breast awareness reviewed. Pap and HR HPV as above. Guidelines for Calcium, Vitamin D, regular exercise program including cardiovascular and weight  bearing exercise. She will call if her menopausal symptoms are not manageable.  Referral for screening colonoscopy.  Follow up annually and prn.   After visit summary provided.

## 2020-07-09 NOTE — Telephone Encounter (Signed)
Please assist in getting copy of patient's LEEP and pathology report from [facility].

## 2020-07-09 NOTE — Telephone Encounter (Signed)
Routing to [place] for records.

## 2020-07-10 NOTE — Telephone Encounter (Signed)
Call placed to [facility], Spoke with [place]. Pt scheduled for dx MMG and right breast US on 07/22/20 at 740 am.  Will call pt for appt.   Call placed to pt. Left pt detailed message per DPR. Name identified on VM. Pt given date and time of appt at [facility]. Pt to return call with any questions or concerns.

## 2020-07-10 NOTE — Telephone Encounter (Signed)
Patient placed in MMG hold.   Encounter closed.  

## 2020-07-11 NOTE — Patient Instructions (Signed)

## 2020-07-11 NOTE — Telephone Encounter (Signed)
Records reviewed, per [facility] at [facility], no record of LEEP or hysteroscopy. Will place records on Dr Elza Rafter desk for review.  Encounter closed.

## 2020-07-11 NOTE — Telephone Encounter (Signed)
Records received and in basket outside your office.

## 2020-07-15 ENCOUNTER — Ambulatory Visit (HOSPITAL_COMMUNITY)
Admission: EM | Admit: 2020-07-15 | Discharge: 2020-07-15 | Disposition: A | Discharge status: Home / Self Care | Payer: 59 | Attending: Family Medicine | Admitting: Family Medicine

## 2020-07-15 ENCOUNTER — Other Ambulatory Visit: Payer: Self-pay

## 2020-07-15 ENCOUNTER — Encounter (HOSPITAL_COMMUNITY): Payer: Self-pay

## 2020-07-15 DIAGNOSIS — R509 Fever, unspecified: Secondary | ICD-10-CM | POA: Diagnosis not present

## 2020-07-15 DIAGNOSIS — R059 Cough, unspecified: Secondary | ICD-10-CM | POA: Diagnosis not present

## 2020-07-15 DIAGNOSIS — R000 Tachycardia, unspecified: Secondary | ICD-10-CM | POA: Diagnosis not present

## 2020-07-15 DIAGNOSIS — R5383 Other fatigue: Secondary | ICD-10-CM

## 2020-07-15 DIAGNOSIS — R6883 Chills (without fever): Secondary | ICD-10-CM | POA: Diagnosis not present

## 2020-07-15 LAB — CBC WITH DIFFERENTIAL/PLATELET
Abs Immature Granulocytes: 0.03 10*3/uL (ref 0.00–0.07)
Basophils Absolute: 0 10*3/uL (ref 0.0–0.1)
Basophils Relative: 0 %
Eosinophils Absolute: 0 10*3/uL (ref 0.0–0.5)
Eosinophils Relative: 0 %
HCT: 39.6 % (ref 36.0–46.0)
Hemoglobin: 13.6 g/dL (ref 12.0–15.0)
Immature Granulocytes: 0 %
Lymphocytes Relative: 14 %
Lymphs Abs: 1 10*3/uL (ref 0.7–4.0)
MCH: 33.8 pg (ref 26.0–34.0)
MCHC: 34.3 g/dL (ref 30.0–36.0)
MCV: 98.5 fL (ref 80.0–100.0)
Monocytes Absolute: 0.7 10*3/uL (ref 0.1–1.0)
Monocytes Relative: 9 %
Neutro Abs: 5.3 10*3/uL (ref 1.7–7.7)
Neutrophils Relative %: 77 %
Platelets: 198 10*3/uL (ref 150–400)
RBC: 4.02 MIL/uL (ref 3.87–5.11)
RDW: 12.2 % (ref 11.5–15.5)
WBC: 7 10*3/uL (ref 4.0–10.5)
nRBC: 0 % (ref 0.0–0.2)

## 2020-07-15 LAB — COMPREHENSIVE METABOLIC PANEL
ALT: 101 U/L — ABNORMAL HIGH (ref 0–44)
AST: 138 U/L — ABNORMAL HIGH (ref 15–41)
Albumin: 4 g/dL (ref 3.5–5.0)
Alkaline Phosphatase: 86 U/L (ref 38–126)
Anion gap: 16 — ABNORMAL HIGH (ref 5–15)
BUN: 7 mg/dL (ref 6–20)
CO2: 21 mmol/L — ABNORMAL LOW (ref 22–32)
Calcium: 9.3 mg/dL (ref 8.9–10.3)
Chloride: 100 mmol/L (ref 98–111)
Creatinine, Ser: 0.71 mg/dL (ref 0.44–1.00)
GFR calc Af Amer: >60 mL/min (ref >60)
GFR calc non Af Amer: >60 mL/min (ref >60)
Glucose, Bld: 126 mg/dL — ABNORMAL HIGH (ref 70–99)
Potassium: 3.4 mmol/L — ABNORMAL LOW (ref 3.5–5.1)
Sodium: 137 mmol/L (ref 135–145)
Total Bilirubin: 0.8 mg/dL (ref 0.3–1.2)
Total Protein: 7.2 g/dL (ref 6.5–8.1)

## 2020-07-15 LAB — POCT URINALYSIS DIPSTICK, ED / UC
Bilirubin Urine: NEGATIVE
Glucose, UA: NEGATIVE mg/dL
Ketones, ur: 15 mg/dL — AB
Leukocytes,Ua: NEGATIVE
Nitrite: NEGATIVE
Protein, ur: NEGATIVE mg/dL
Specific Gravity, Urine: 1.01 (ref 1.005–1.030)
Urobilinogen, UA: 0.2 mg/dL (ref 0.0–1.0)
pH: 6 (ref 5.0–8.0)

## 2020-07-15 LAB — TSH: TSH: 6.651 u[IU]/mL — ABNORMAL HIGH (ref 0.350–4.500)

## 2020-07-15 NOTE — Discharge Instructions (Addendum)
Your EKG was not concerning I am drawing some lab work Go home, rest, drink fluids No alcohol.  Ibuprofen for fever.  I will call you when I get the results I believe that it would be okay to take a xanax to relax.

## 2020-07-15 NOTE — ED Notes (Signed)
Pt had covid testing this morning & is pending results.

## 2020-07-15 NOTE — ED Triage Notes (Signed)
Pt presents with chronic non productive cough, chills, diaphoresis, and fatigue since yesterday.  Pt got covid booster vaccine on Friday.

## 2020-07-16 ENCOUNTER — Encounter: Payer: Self-pay | Admitting: Family Medicine

## 2020-07-16 ENCOUNTER — Other Ambulatory Visit: Payer: Self-pay | Admitting: Family Medicine

## 2020-07-16 MED FILL — ALPRAZolam 0.5 MG TABS: 0.5 | 15 days supply | Qty: 30 | Fill #0

## 2020-07-16 NOTE — Telephone Encounter (Signed)
Name of Medication: Xanax Name of Pharmacy: [facility] or Written Date and Quantity: 02/26/20 #30 tabs 1 refill Last Office Visit and Type: F/U on 08/29/20 Next Office Visit and Type:  No future :

## 2020-07-16 NOTE — ED Provider Notes (Signed)
[facility]    CSN: [id number] Arrival date & time: 07/15/20  1251      History   Chief Complaint Chief Complaint  Patient presents with  . Chills  . Fatigue  . Cough    HPI Jamie Coleman is a 49 y.o. female.   Patient is a 49 year old female past medical history of allergy, anxiety, GERD, hyperlipidemia.  He presents today with nonproductive cough, chills, diaphoresis, fatigue, tachycardia since yesterday.  Reporting that she received her Covid booster on Friday.  Denies any chest pain or shortness of breath.  Denies any fevers.  Has low-grade fever here today 9.7.  She is tachycardic with mild elevated blood pressure reading.  Patient very anxious about feeling this way.  Denies any history of DVT, PE.  No calf pain or swelling.  No long distance traveling or hormone use.  No cough, chest congestion, dysuria, materia or urinary frequency.  She has had some elevated liver enzymes in the past.  Does admit to drinking daily.  Has not had her labs rechecked recently.     Past Medical History:  Diagnosis Date  . Allergy    allergic rhinitis  . Anxiety    situational  . Difficult intubation    states 2012 ablation surgery, had diff intubation,glide scope used, not noted in EPIC anesthesia record  . Elevated hemoglobin A1c 08/08/15  . Elevated liver function tests 08/08/15  . GERD (gastroesophageal reflux disease)   . Hyperlipidemia   . Low serum vitamin D 08/08/15  . Status post endometrial ablation   . Tennis elbow    currently in PT-10/16    Patient Active Problem List   Diagnosis Date Noted  . Elevated liver transaminase level 09/03/2019  . Elevated TSH 09/03/2019  . Skin infection 08/30/2019  . Vitamin D deficiency 03/02/2018  . Fatigue 03/02/2018  . Pupil asymmetry 08/04/2016  . Hemorrhoids 12/28/2014  . Prediabetes 08/28/2014  . Rash and nonspecific skin eruption 08/26/2014  . Tremor 06/30/2013  . Sebaceous cyst 02/15/2013  . OTHER&UNSPECIFIED  DISEASES THE ORAL SOFT TISSUES 07/25/2008  . DERMATOFIBROMA, ARM 11/11/2007  . NEOPLASM, SKIN, UNCERTAIN BEHAVIOR 67/20/9470  . Hyperlipidemia 02/10/2007  . Anxiety disorder 02/10/2007  . ALLERGIC RHINITIS 02/10/2007  . GERD 02/10/2007  . IBS 02/10/2007    Past Surgical History:  Procedure Laterality Date  . CESAREAN SECTION    . ENDOMETRIAL ABLATION     8/12  . EXTREMITY WIRE/PIN REMOVAL Left 07/16/2016   Procedure: PIN REMOVAL OF LEFT WRIST X 2;  Surgeon: Roseanne Kaufman, MD;  Location: [place];  Service: Orthopedics;  Laterality: Left;  . FOOT SURGERY    . LAPAROSCOPIC TUBAL LIGATION  06/09/2011   Procedure: LAPAROSCOPIC TUBAL LIGATION;  Surgeon: Arloa Koh;  Location: [facility];  Service: Gynecology;  Laterality: N/A;  . LIGAMENT REPAIR Left 05/08/2016   Procedure: LEFT WRIST SCAPHOLUNATE LIGAMENT RECONSTRUCTION WITH TENDON GRAFT PIN NEURECTOMY AND REPAIR;  Surgeon: Roseanne Kaufman, MD;  Location: [place];  Service: Orthopedics;  Laterality: Left;  . TONSILLECTOMY     as child  . TUBAL LIGATION  8/12    OB History    Gravida  1   Para  1   Term  1   Preterm      AB      Living  2     SAB      TAB      Ectopic      Multiple  1  Live Births  2            Home Medications    Prior to Admission medications   Medication Sig Start Date End Date Taking? Authorizing Provider  ALPRAZolam Duanne Moron) 0.5 MG tablet TAKE 1 TABLET BY MOUTH TWICE DAILY AS NEEDED FOR ANXIETY 02/26/20   Tower, Wynelle Fanny, MD  Ascorbic Acid (VITAMIN C) 1000 MG tablet Take 1,000 mg by mouth daily.    [provider]  Calcium Carbonate-Vit D-Min 600-400 MG-UNIT TABS Take 1 tablet by mouth 2 (two) times daily.     [provider]  Cholecalciferol (VITAMIN D3) 5000 units TABS Take 1 tablet by mouth every other day.    [provider]  fexofenadine (ALLEGRA) 180 MG tablet Take 180 mg by mouth daily.    [provider]    fluorometholone (FML) 0.1 % ophthalmic suspension SMARTSIG:1 Drop(s) In Eye(s) 3 Times Daily PRN 06/14/20   [provider]  ibuprofen (ADVIL,MOTRIN) 800 MG tablet Take 1 tablet (800 mg total) by mouth every 8 (eight) hours as needed (with food). 04/09/15   Tower, Wynelle Fanny, MD  Multiple Vitamin (MULTIVITAMIN) capsule Take 1 capsule by mouth daily.      [provider]  Omega-3 Fatty Acids (FISH OIL PO) Take by mouth daily.      [provider]  omeprazole (PRILOSEC) 20 MG capsule Take 20 mg by mouth daily.    [provider]    Family History Family History  Problem Relation Age of Onset  . Cancer Mother        CA insitu of appendix  . Heart disease Father        CAD  . Diabetes Father        type II  . Diabetes Brother        type II  . Diabetes Maternal Grandfather   . Diabetes Paternal Grandmother   . Diabetes Paternal Grandfather     Social History Social History   Tobacco Use  . Smoking status: Former Smoker    Quit date: 07/25/1983    Years since quitting: 37.0  . Smokeless tobacco: Never Used  . Tobacco comment: in high school  Vaping Use  . Vaping Use: Never used  Substance Use Topics  . Alcohol use: Yes    Alcohol/week: 14.0 standard drinks    Types: 14 Glasses of wine per week  . Drug use: No     Allergies   Lipitor [atorvastatin]   Review of Systems Review of Systems   Physical Exam Triage Vital Signs ED Triage Vitals  Enc Vitals Group     BP 07/15/20 1515 (!) 151/102     Pulse Rate 07/15/20 1515 (!) 116     Resp 07/15/20 1515 20     Temp 07/15/20 1515 99.7 F (37.6 C)     Temp Source 07/15/20 1515 Oral     SpO2 07/15/20 1515 100 %     Weight --      Height --      Head Circumference --      Peak Flow --      Pain Score 07/15/20 1511 1     Pain Loc --      Pain Edu? --      Excl. in [place]? --    No data found.  Updated Vital Signs BP (!) 151/102 (BP Location: Left Arm)   Pulse (!) 116   Temp 99.7 F  (37.6 C) (Oral)  Resp 20   SpO2 100%   Visual Acuity Right Eye Distance:   Left Eye Distance:   Bilateral Distance:    Right Eye Near:   Left Eye Near:    Bilateral Near:     Physical Exam Vitals and nursing note reviewed.  Constitutional:      Appearance: Normal appearance.  HENT:     Head: Normocephalic and atraumatic.     Mouth/Throat:     Pharynx: Oropharynx is clear.  Eyes:     Comments: Bilateral scleral injection.    Cardiovascular:     Rate and Rhythm: Tachycardia present.     Heart sounds: Normal heart sounds.  Pulmonary:     Effort: Pulmonary effort is normal.     Breath sounds: Normal breath sounds.  Skin:    General: Skin is warm and dry.  Neurological:     General: No focal deficit present.     Mental Status: She is alert.  Psychiatric:        Mood and Affect: Mood is anxious.        Behavior: Behavior normal.        Thought Content: Thought content normal.        Judgment: Judgment normal.      UC Treatments / Results  Labs (all labs ordered are listed, but only abnormal results are displayed) Labs Reviewed  COMPREHENSIVE METABOLIC PANEL - Abnormal; Notable for the following components:      Result Value   Potassium 3.4 (*)    CO2 21 (*)    Glucose, Bld 126 (*)    AST 138 (*)    ALT 101 (*)    Anion gap 16 (*)    All other components within normal limits  TSH - Abnormal; Notable for the following components:   TSH 6.651 (*)    All other components within normal limits  POCT URINALYSIS DIPSTICK, ED / UC - Abnormal; Notable for the following components:   Ketones, ur 15 (*)    Hgb urine dipstick TRACE (*)    All other components within normal limits  CBC WITH DIFFERENTIAL/PLATELET    EKG   Radiology No results found.  Procedures Procedures (including critical care time)  Medications Ordered in UC Medications - No data to display  Initial Impression / Assessment and Plan / UC Course  I have reviewed the triage vital signs  and the nursing notes.  Pertinent labs & imaging results that were available during my care of the patient were reviewed by me and considered in my medical decision making (see chart for details).     Fever, tachycardia-most likely side effects from the vaccine.  Symptoms started shortly after this.  Patient anxious about her elevated heart rate.  She denies any chest pain or shortness of breath associated with this.  No concern for PE at this time. EKG with sinus tachycardia VSS  Believe she may be slightly dehydrated Recommended go home, rest, drink fluids.  We are drawing some lab work to rule out a few things. Recommend not drinking any alcohol at this time.  Ibuprofen for fever or pain as needed. Recommend also taking a Xanax to relax due to her anxiety. Recommended go to the ER for any continued or worsening problems  Spoke with patient with phone about lab results last evening.  Concern due to worsening AST and ALT.  Recommended patient stop drinking. If she goes to withdrawal she can take her Xanax to help with this. Recommended  drink lots and lots of fluids, rest Her Covid test is still pending. Patient reported on the phone that her heart rate had dropped into the 90s and she was feeling slightly better. This is reassuring.  She is mildly dehydrated appearsFinal Clinical Impressions(s) / UC Diagnoses   Final diagnoses:  Fever, unspecified  Tachycardia     Discharge Instructions     Your EKG was not concerning I am drawing some lab work Go home, rest, drink fluids No alcohol.  Ibuprofen for fever.  I will call you when I get the results I believe that it would be okay to take a xanax to relax.      ED Prescriptions    None     PDMP not reviewed this encounter.   Orvan July, NP 07/16/20 1059

## 2020-07-19 ENCOUNTER — Encounter: Payer: Self-pay | Admitting: *Deleted

## 2020-07-22 ENCOUNTER — Other Ambulatory Visit: Payer: 59

## 2020-08-19 ENCOUNTER — Telehealth: Payer: Self-pay | Admitting: Family Medicine

## 2020-08-19 DIAGNOSIS — E7800 Pure hypercholesterolemia, unspecified: Secondary | ICD-10-CM

## 2020-08-19 DIAGNOSIS — Z0000 Encounter for general adult medical examination without abnormal findings: Secondary | ICD-10-CM | POA: Insufficient documentation

## 2020-08-19 DIAGNOSIS — R7303 Prediabetes: Secondary | ICD-10-CM

## 2020-08-19 DIAGNOSIS — R7401 Elevation of levels of liver transaminase levels: Secondary | ICD-10-CM

## 2020-08-19 DIAGNOSIS — R7989 Other specified abnormal findings of blood chemistry: Secondary | ICD-10-CM

## 2020-08-19 DIAGNOSIS — E559 Vitamin D deficiency, unspecified: Secondary | ICD-10-CM

## 2020-08-19 NOTE — Telephone Encounter (Signed)
-----   Message from Cloyd Stagers, RT sent at 08/06/2020  1:44 PM EDT ----- Regarding: Lab Orders for Tuesday 11.9.2021 Please place lab orders for Tuesday 11.9.2021, office visit for physical on Friday 11.12.2021 Thank you, Dyke Maes RT(R)

## 2020-08-20 ENCOUNTER — Other Ambulatory Visit: Payer: 59

## 2020-08-23 ENCOUNTER — Encounter: Payer: 59 | Admitting: Family Medicine

## 2020-09-09 ENCOUNTER — Other Ambulatory Visit: Payer: 59

## 2020-09-10 ENCOUNTER — Encounter: Payer: 59 | Admitting: Family Medicine

## 2020-09-13 ENCOUNTER — Ambulatory Visit
Admission: RE | Admit: 2020-09-13 | Discharge: 2020-09-13 | Disposition: A | Discharge status: Home / Self Care | Payer: 59 | Source: Ambulatory Visit | Attending: Obstetrics and Gynecology | Admitting: Obstetrics and Gynecology

## 2020-09-13 ENCOUNTER — Other Ambulatory Visit: Payer: Self-pay

## 2020-09-13 DIAGNOSIS — N6310 Unspecified lump in the right breast, unspecified quadrant: Secondary | ICD-10-CM

## 2020-09-13 DIAGNOSIS — N6489 Other specified disorders of breast: Secondary | ICD-10-CM | POA: Diagnosis not present

## 2020-09-13 DIAGNOSIS — R922 Inconclusive mammogram: Secondary | ICD-10-CM | POA: Diagnosis not present

## 2020-10-02 ENCOUNTER — Other Ambulatory Visit: Payer: 59

## 2020-10-09 ENCOUNTER — Encounter: Payer: 59 | Admitting: Family Medicine

## 2020-10-14 MED FILL — ALPRAZolam 0.5 MG TABS: 0.5 | 15 days supply | Qty: 30 | Fill #1

## 2020-12-02 ENCOUNTER — Encounter: Payer: Self-pay | Admitting: Family Medicine

## 2020-12-02 ENCOUNTER — Other Ambulatory Visit: Payer: Self-pay | Admitting: Family Medicine

## 2020-12-02 MED ORDER — ALPRAZOLAM 0.5 MG PO TABS
0.5000 mg | ORAL_TABLET | Freq: Two times a day (BID) | ORAL | 0 refills | Status: DC | PRN
Start: 2020-12-02 — End: 2020-12-02

## 2020-12-02 MED FILL — ALPRAZolam 0.5 MG TABS: 0.5 | 15 days supply | Qty: 30 | Fill #0

## 2020-12-02 NOTE — Telephone Encounter (Signed)
See mychart message. Pt still hasn't made an appt. Pt has cancelled and no-showed over 5 appts (all CPE/lab appts) here at the office and last time we saw her was 2020 so xanax refill was denied due to no appt and multiple cancellations but again please see pt's mychart message in response of "needing an appt"

## 2020-12-02 NOTE — Telephone Encounter (Signed)
Pt has cancelled/ no-showed multiple CPE appts., will decline until she gets an appt scheduled

## 2021-02-11 ENCOUNTER — Other Ambulatory Visit (HOSPITAL_COMMUNITY): Payer: Self-pay

## 2021-02-11 MED ORDER — FLUOROMETHOLONE 0.1 % OP SUSP
1.0000 [drp] | Freq: Three times a day (TID) | OPHTHALMIC | 0 refills | Status: DC | PRN
  Filled 2021-02-11: qty 5, 16d supply, fill #0
  Filled 2021-02-12: qty 5, 16d supply, fill #1

## 2021-02-12 ENCOUNTER — Other Ambulatory Visit (HOSPITAL_COMMUNITY): Payer: Self-pay

## 2021-02-13 ENCOUNTER — Other Ambulatory Visit (HOSPITAL_COMMUNITY): Payer: Self-pay

## 2021-02-24 ENCOUNTER — Other Ambulatory Visit (HOSPITAL_COMMUNITY): Payer: Self-pay

## 2021-02-26 ENCOUNTER — Other Ambulatory Visit (HOSPITAL_COMMUNITY): Payer: Self-pay

## 2021-05-07 ENCOUNTER — Other Ambulatory Visit (HOSPITAL_BASED_OUTPATIENT_CLINIC_OR_DEPARTMENT_OTHER): Payer: Self-pay

## 2021-05-07 ENCOUNTER — Ambulatory Visit: Payer: 59 | Attending: Internal Medicine

## 2021-05-07 ENCOUNTER — Telehealth: Payer: Self-pay

## 2021-05-07 ENCOUNTER — Other Ambulatory Visit: Payer: Self-pay

## 2021-05-07 DIAGNOSIS — Z23 Encounter for immunization: Secondary | ICD-10-CM

## 2021-05-07 MED ORDER — CARESTART COVID-19 HOME TEST VI KIT
PACK | 0 refills | Status: DC
  Filled 2021-05-07: qty 4, 8d supply, fill #0

## 2021-05-07 NOTE — Telephone Encounter (Signed)
Hold off for now Let's see what is recommended in the fall re: new vaccine also

## 2021-05-07 NOTE — Telephone Encounter (Signed)
Pt called to find out if she should get her 4th Covid vaccine. She had a bad local reaction to the 3rd vaccine. Injection site was red, swollen, hot to touch. Went to UC but r/o cellulitis. Pt is asking if she should get the 4th vaccine. She is aware Dr Glori Bickers is out of the office the rest of the week.

## 2021-05-07 NOTE — Telephone Encounter (Signed)
Per DPR left VM letting pt know Dr. Tower's comments  

## 2021-05-08 ENCOUNTER — Other Ambulatory Visit (HOSPITAL_BASED_OUTPATIENT_CLINIC_OR_DEPARTMENT_OTHER): Payer: Self-pay

## 2021-05-08 MED ORDER — PFIZER-BIONT COVID-19 VAC-TRIS 30 MCG/0.3ML IM SUSP
INTRAMUSCULAR | 0 refills | Status: DC
  Filled 2021-05-08: qty 0.3, 1d supply, fill #0

## 2021-05-08 NOTE — Progress Notes (Signed)
   Covid-19 Vaccination Clinic  Name:  Jamie Coleman    MRN: [id number] DOB: 02-Nov-1970  05/08/2021  [name] was observed post Covid-19 immunization for 15 minutes without incident. She was provided with Vaccine Information Sheet and instruction to access the V-Safe system.   [name] was instructed to call 911 with any severe reactions post vaccine: Difficulty breathing  Swelling of face and throat  A fast heartbeat  A bad rash all over body  Dizziness and weakness   Immunizations Administered     Name Date Dose VIS Date Route   PFIZER Comrnaty(Gray TOP) Covid-19 Vaccine 05/07/2021  1:26 PM 0.3 mL 09/19/2020 Intramuscular   Manufacturer: [place]   Lot: [id number]   [place]: [phone]

## 2021-05-12 DIAGNOSIS — U071 COVID-19: Secondary | ICD-10-CM

## 2021-05-12 HISTORY — DX: COVID-19: U07.1

## 2021-07-15 NOTE — Progress Notes (Signed)
50 y.o. G66P1002 Married Caucasian female here for annual exam.    Feeling hot all the time.  Having very minimal spotting. Can occur and then again 2 weeks later, and then not for about 6 months.  Last time was this week.   Her father has dementia.  Family gathering this weekend in [place] [facility] [place].  PCP:  Loura Pardon, MD   No LMP recorded. Patient has had an ablation.           Sexually active: Yes.    The current method of family planning is tubal ligation/ablation.    Exercising: No.  The patient does not participate in regular exercise at present.  Does get 10,000 steps/day Smoker:  Former  Health Maintenance: Pap:  03/21/18 Neg:Neg HR HPV, 07-27-14 Neg:Neg HR HPV, 07-25-13 Neg:Neg HR HPV  History of abnormal Pap:  Yes, Hx of LEEP, done prior to her ablation period.  MMG: 09-13-20 Diag.Bil.w/Rt.Bt.[place] -- SEE Epic.  Unaware that she was due for a right breast US in June, 2022. Colonoscopy:  NEVER   BMD:   n/a  Result  n/a TDaP:  up to date through work Gardasil:   no HIV: Neg in preg Hep C: never Screening Labs:  PCP.   reports that she quit smoking about 38 years ago. Her smoking use included cigarettes. She has never used smokeless tobacco. She reports current alcohol use of about 14.0 standard drinks per week. She reports that she does not use drugs.  Past Medical History:  Diagnosis Date   Allergy    allergic rhinitis   Anxiety    situational   COVID 05/2021   Difficult intubation    states 2012 ablation surgery, had diff intubation,glide scope used, not noted in EPIC anesthesia record   Elevated hemoglobin A1c 08/08/2015   Elevated liver function tests 08/08/2015   GERD (gastroesophageal reflux disease)    Hyperlipidemia    Low serum vitamin D 08/08/2015   Status post endometrial ablation    Tennis elbow    currently in PT-10/16    Past Surgical History:  Procedure Laterality Date   CESAREAN SECTION     ENDOMETRIAL ABLATION     8/12   EXTREMITY WIRE/PIN REMOVAL Left  07/16/2016   Procedure: PIN REMOVAL OF LEFT WRIST X 2;  Surgeon: Roseanne Kaufman, MD;  Location: [place];  Service: Orthopedics;  Laterality: Left;   FOOT SURGERY     LAPAROSCOPIC TUBAL LIGATION  06/09/2011   Procedure: LAPAROSCOPIC TUBAL LIGATION;  Surgeon: Arloa Koh;  Location: [facility];  Service: Gynecology;  Laterality: N/A;   LIGAMENT REPAIR Left 05/08/2016   Procedure: LEFT WRIST SCAPHOLUNATE LIGAMENT RECONSTRUCTION WITH TENDON GRAFT PIN NEURECTOMY AND REPAIR;  Surgeon: Roseanne Kaufman, MD;  Location: [place];  Service: Orthopedics;  Laterality: Left;   TONSILLECTOMY     as child   TUBAL LIGATION  8/12    Current Outpatient Medications  Medication Sig Dispense Refill   ALPRAZolam (XANAX) 0.5 MG tablet Take 0.5 mg by mouth at bedtime as needed for anxiety.     Ascorbic Acid (VITAMIN C) 1000 MG tablet Take 1,000 mg by mouth daily.     Calcium Carbonate-Vit D-Min 600-400 MG-UNIT TABS Take 1 tablet by mouth 2 (two) times daily.      Cholecalciferol (VITAMIN D3) 5000 units TABS Take 1 tablet by mouth every other day.     fexofenadine (ALLEGRA) 180 MG tablet Take 180 mg by mouth daily.     fluorometholone (  FML) 0.1 % ophthalmic suspension Place 1 drop into both eyes 3 (three) times daily as needed. 10 mL 0   ibuprofen (ADVIL,MOTRIN) 800 MG tablet Take 1 tablet (800 mg total) by mouth every 8 (eight) hours as needed (with food). 90 tablet 2   Multiple Vitamin (MULTIVITAMIN) capsule Take 1 capsule by mouth daily.       Omega-3 Fatty Acids (FISH OIL PO) Take by mouth daily.       omeprazole (PRILOSEC) 20 MG capsule Take 20 mg by mouth daily.     No current facility-administered medications for this visit.    Family History  Problem Relation Age of Onset   Cancer Mother        [place] insitu of appendix   Heart disease Father        CAD   Diabetes Father        type II   Alzheimer's disease Father    Diabetes Brother        type II   Diabetes Maternal  Grandfather    Diabetes Paternal Grandmother    Diabetes Paternal Grandfather    Breast cancer Neg Hx     Review of Systems  All other systems reviewed and are negative.  Exam:   BP 122/64   Pulse (!) 101   Ht 5\' 1"  (1.549 m)   Wt 176 lb (79.8 kg)   SpO2 97%   BMI 33.25 kg/m     General appearance: alert, cooperative and appears stated age Head: normocephalic, without obvious abnormality, atraumatic Neck: no adenopathy, supple, symmetrical, trachea midline and thyroid normal to inspection and palpation Lungs: clear to auscultation bilaterally Breasts: right - normal appearance,  4 mm mass at 8:00 at edge of areola, No nipple retraction or dimpling, No nipple discharge or bleeding, No axillary adenopathy Left - normal appearance, no masses, No nipple retraction or dimpling, No nipple discharge or bleeding, No axillary adenopathy Heart: regular rate and rhythm Abdomen: soft, non-tender; no masses, no organomegaly Extremities: extremities normal, atraumatic, no cyanosis or edema Skin: skin color, texture, turgor normal. No rashes or lesions Lymph nodes: cervical, supraclavicular, and axillary nodes normal. Neurologic: grossly normal  Pelvic: External genitalia:  no lesions              No abnormal inguinal nodes palpated.              Urethra:  normal appearing urethra with no masses, tenderness or lesions              Bartholins and Skenes: normal                 Vagina: normal appearing vagina with normal color and discharge, no lesions              Cervix: no lesions.  Tiny clot at external os.               Pap taken: yes. Bimanual Exam:  Uterus:  normal size, contour, position, consistency, mobility, non-tender              Adnexa: no mass, fullness, tenderness              Rectal exam: yes.  Confirms.              Anus:  normal sphincter tone, no lesions  Chaperone was present for exam:  Raquel Sarna, CMA  Assessment:   Well woman visit with gynecologic exam. Status post BTL  and endometrial ablation.  Remote  history of abnormal pap. Uncertain history of LEEP procedure.  Menopausal symptoms.  Right breast mass. History of bilateral nipple discharge. Normal prolactin and TSH.  Hx elevated prolactin many years ago.   Plan: Mammogram screening discussed.   Will assist in schedule a right breast US.  Self breast awareness reviewed. Pap and HR HPV as above. Guidelines for Calcium, Vitamin D, regular exercise program including cardiovascular and weight bearing exercise. Observational management of cycles.   She will call if cycles are close together or prolonged.  Colonoscopy recommended.  Follow up annually and prn.   After visit summary provided.

## 2021-07-16 ENCOUNTER — Ambulatory Visit (INDEPENDENT_AMBULATORY_CARE_PROVIDER_SITE_OTHER): Payer: 59 | Admitting: Obstetrics and Gynecology

## 2021-07-16 ENCOUNTER — Ambulatory Visit: Payer: 59 | Admitting: Obstetrics and Gynecology

## 2021-07-16 ENCOUNTER — Encounter: Payer: Self-pay | Admitting: Obstetrics and Gynecology

## 2021-07-16 ENCOUNTER — Other Ambulatory Visit (HOSPITAL_COMMUNITY)
Admission: RE | Admit: 2021-07-16 | Discharge: 2021-07-16 | Disposition: A | Discharge status: Home / Self Care | Payer: 59 | Source: Ambulatory Visit | Attending: Obstetrics and Gynecology | Admitting: Obstetrics and Gynecology

## 2021-07-16 ENCOUNTER — Other Ambulatory Visit: Payer: Self-pay

## 2021-07-16 ENCOUNTER — Telehealth: Payer: Self-pay | Admitting: Obstetrics and Gynecology

## 2021-07-16 VITALS — BP 122/64 | HR 101 | Ht 61.0 in | Wt 176.0 lb

## 2021-07-16 DIAGNOSIS — Z124 Encounter for screening for malignant neoplasm of cervix: Secondary | ICD-10-CM | POA: Diagnosis not present

## 2021-07-16 DIAGNOSIS — Z01419 Encounter for gynecological examination (general) (routine) without abnormal findings: Secondary | ICD-10-CM | POA: Diagnosis not present

## 2021-07-16 DIAGNOSIS — Z09 Encounter for follow-up examination after completed treatment for conditions other than malignant neoplasm: Secondary | ICD-10-CM

## 2021-07-16 NOTE — Telephone Encounter (Signed)
Patient scheduled on 08/04/21 @ 1:40pm. Patient asked if I could sent my chart message.

## 2021-07-16 NOTE — Telephone Encounter (Signed)
Please assist in scheduling a right breast ultrasound for my patient at the [facility].  This is a follow up study that was due in June, 2022, that the patient was unaware of.   She is due for her screening mammogram in December.

## 2021-07-16 NOTE — Patient Instructions (Signed)

## 2021-07-17 LAB — CYTOLOGY - PAP
Comment: NEGATIVE
Diagnosis: NEGATIVE
High risk HPV: NEGATIVE

## 2021-07-23 ENCOUNTER — Other Ambulatory Visit (HOSPITAL_COMMUNITY): Payer: Self-pay

## 2021-07-23 DIAGNOSIS — E119 Type 2 diabetes mellitus without complications: Secondary | ICD-10-CM | POA: Diagnosis not present

## 2021-07-23 DIAGNOSIS — H16223 Keratoconjunctivitis sicca, not specified as Sjogren's, bilateral: Secondary | ICD-10-CM | POA: Diagnosis not present

## 2021-07-23 DIAGNOSIS — H04123 Dry eye syndrome of bilateral lacrimal glands: Secondary | ICD-10-CM | POA: Diagnosis not present

## 2021-07-23 DIAGNOSIS — H524 Presbyopia: Secondary | ICD-10-CM | POA: Diagnosis not present

## 2021-07-23 DIAGNOSIS — G902 Horner's syndrome: Secondary | ICD-10-CM | POA: Diagnosis not present

## 2021-07-23 LAB — HM DIABETES EYE EXAM

## 2021-07-23 MED ORDER — FLUOROMETHOLONE 0.1 % OP SUSP
OPHTHALMIC | 1 refills | Status: DC
Start: 2021-07-23 — End: 2022-08-31
  Filled 2021-07-23: qty 5, 40d supply, fill #0
  Filled 2022-02-24: qty 5, 40d supply, fill #1

## 2021-08-01 ENCOUNTER — Encounter: Payer: Self-pay | Admitting: Family Medicine

## 2021-08-04 ENCOUNTER — Other Ambulatory Visit: Payer: Self-pay

## 2021-08-04 ENCOUNTER — Other Ambulatory Visit: Payer: Self-pay | Admitting: Obstetrics and Gynecology

## 2021-08-04 ENCOUNTER — Ambulatory Visit
Admission: RE | Admit: 2021-08-04 | Discharge: 2021-08-04 | Disposition: A | Discharge status: Home / Self Care | Payer: 59 | Source: Ambulatory Visit | Attending: Obstetrics and Gynecology | Admitting: Obstetrics and Gynecology

## 2021-08-04 DIAGNOSIS — N6313 Unspecified lump in the right breast, lower outer quadrant: Secondary | ICD-10-CM | POA: Diagnosis not present

## 2021-08-04 DIAGNOSIS — N6310 Unspecified lump in the right breast, unspecified quadrant: Secondary | ICD-10-CM

## 2021-08-04 DIAGNOSIS — Z09 Encounter for follow-up examination after completed treatment for conditions other than malignant neoplasm: Secondary | ICD-10-CM

## 2021-08-23 NOTE — Telephone Encounter (Signed)
Encounter reviewed and closed.  

## 2021-09-15 ENCOUNTER — Ambulatory Visit
Admission: RE | Admit: 2021-09-15 | Discharge: 2021-09-15 | Disposition: A | Discharge status: Home / Self Care | Payer: 59 | Source: Ambulatory Visit | Attending: Obstetrics and Gynecology | Admitting: Obstetrics and Gynecology

## 2021-09-15 DIAGNOSIS — N6313 Unspecified lump in the right breast, lower outer quadrant: Secondary | ICD-10-CM | POA: Diagnosis not present

## 2021-09-15 DIAGNOSIS — N6310 Unspecified lump in the right breast, unspecified quadrant: Secondary | ICD-10-CM

## 2021-09-15 DIAGNOSIS — R922 Inconclusive mammogram: Secondary | ICD-10-CM | POA: Diagnosis not present

## 2021-09-17 ENCOUNTER — Emergency Department: Payer: 59

## 2021-09-17 ENCOUNTER — Other Ambulatory Visit: Payer: Self-pay

## 2021-09-17 ENCOUNTER — Encounter: Payer: Self-pay | Admitting: Family Medicine

## 2021-09-17 ENCOUNTER — Observation Stay
Admission: EM | Admit: 2021-09-17 | Discharge: 2021-09-18 | Disposition: A | Discharge status: Home / Self Care | Payer: 59 | Attending: Internal Medicine | Admitting: Internal Medicine

## 2021-09-17 DIAGNOSIS — K760 Fatty (change of) liver, not elsewhere classified: Secondary | ICD-10-CM | POA: Diagnosis not present

## 2021-09-17 DIAGNOSIS — Z87891 Personal history of nicotine dependence: Secondary | ICD-10-CM | POA: Diagnosis not present

## 2021-09-17 DIAGNOSIS — F10239 Alcohol dependence with withdrawal, unspecified: Secondary | ICD-10-CM | POA: Diagnosis not present

## 2021-09-17 DIAGNOSIS — R17 Unspecified jaundice: Secondary | ICD-10-CM

## 2021-09-17 DIAGNOSIS — F10930 Alcohol use, unspecified with withdrawal, uncomplicated: Secondary | ICD-10-CM | POA: Diagnosis not present

## 2021-09-17 DIAGNOSIS — K7010 Alcoholic hepatitis without ascites: Secondary | ICD-10-CM | POA: Diagnosis not present

## 2021-09-17 DIAGNOSIS — R7401 Elevation of levels of liver transaminase levels: Secondary | ICD-10-CM

## 2021-09-17 DIAGNOSIS — Z79899 Other long term (current) drug therapy: Secondary | ICD-10-CM | POA: Insufficient documentation

## 2021-09-17 DIAGNOSIS — Z20822 Contact with and (suspected) exposure to covid-19: Secondary | ICD-10-CM | POA: Diagnosis not present

## 2021-09-17 DIAGNOSIS — R160 Hepatomegaly, not elsewhere classified: Secondary | ICD-10-CM | POA: Diagnosis not present

## 2021-09-17 DIAGNOSIS — F10939 Alcohol use, unspecified with withdrawal, unspecified: Secondary | ICD-10-CM | POA: Diagnosis present

## 2021-09-17 LAB — BASIC METABOLIC PANEL
Anion gap: 12 (ref 5–15)
BUN: <5 mg/dL — ABNORMAL LOW (ref 6–20)
CO2: 24 mmol/L (ref 22–32)
Calcium: 8.9 mg/dL (ref 8.9–10.3)
Chloride: 97 mmol/L — ABNORMAL LOW (ref 98–111)
Creatinine, Ser: 0.42 mg/dL — ABNORMAL LOW (ref 0.44–1.00)
GFR, Estimated: >60 mL/min (ref >60)
Glucose, Bld: 127 mg/dL — ABNORMAL HIGH (ref 70–99)
Potassium: 3.1 mmol/L — ABNORMAL LOW (ref 3.5–5.1)
Sodium: 133 mmol/L — ABNORMAL LOW (ref 135–145)

## 2021-09-17 LAB — PHOSPHORUS: Phosphorus: 3.1 mg/dL (ref 2.5–4.6)

## 2021-09-17 LAB — RESP PANEL BY RT-PCR (FLU A&B, COVID) ARPGX2
Influenza A by PCR: NEGATIVE
Influenza B by PCR: NEGATIVE
SARS Coronavirus 2 by RT PCR: NEGATIVE

## 2021-09-17 LAB — CBC WITH DIFFERENTIAL/PLATELET
Abs Immature Granulocytes: 0.03 10*3/uL (ref 0.00–0.07)
Basophils Absolute: 0.1 10*3/uL (ref 0.0–0.1)
Basophils Relative: 1 %
Eosinophils Absolute: 0 10*3/uL (ref 0.0–0.5)
Eosinophils Relative: 0 %
HCT: 39.1 % (ref 36.0–46.0)
Hemoglobin: 13.6 g/dL (ref 12.0–15.0)
Immature Granulocytes: 0 %
Lymphocytes Relative: 10 %
Lymphs Abs: 0.7 10*3/uL (ref 0.7–4.0)
MCH: 36.2 pg — ABNORMAL HIGH (ref 26.0–34.0)
MCHC: 34.8 g/dL (ref 30.0–36.0)
MCV: 104 fL — ABNORMAL HIGH (ref 80.0–100.0)
Monocytes Absolute: 1.1 10*3/uL — ABNORMAL HIGH (ref 0.1–1.0)
Monocytes Relative: 15 %
Neutro Abs: 5.3 10*3/uL (ref 1.7–7.7)
Neutrophils Relative %: 74 %
Platelets: 167 10*3/uL (ref 150–400)
RBC: 3.76 MIL/uL — ABNORMAL LOW (ref 3.87–5.11)
RDW: 14.4 % (ref 11.5–15.5)
WBC: 7.2 10*3/uL (ref 4.0–10.5)
nRBC: 0 % (ref 0.0–0.2)

## 2021-09-17 LAB — HEPATIC FUNCTION PANEL
ALT: 64 U/L — ABNORMAL HIGH (ref 0–44)
AST: 258 U/L — ABNORMAL HIGH (ref 15–41)
Albumin: 3.4 g/dL — ABNORMAL LOW (ref 3.5–5.0)
Alkaline Phosphatase: 245 U/L — ABNORMAL HIGH (ref 38–126)
Bilirubin, Direct: 3.8 mg/dL — ABNORMAL HIGH (ref 0.0–0.2)
Indirect Bilirubin: 2.6 mg/dL — ABNORMAL HIGH (ref 0.3–0.9)
Total Bilirubin: 6.4 mg/dL — ABNORMAL HIGH (ref 0.3–1.2)
Total Protein: 7.5 g/dL (ref 6.5–8.1)

## 2021-09-17 LAB — URINE DRUG SCREEN, QUALITATIVE (ARMC ONLY)
Amphetamines, Ur Screen: NOT DETECTED
Barbiturates, Ur Screen: NOT DETECTED
Benzodiazepine, Ur Scrn: NOT DETECTED
Cannabinoid 50 Ng, Ur ~~LOC~~: NOT DETECTED
Cocaine Metabolite,Ur ~~LOC~~: NOT DETECTED
MDMA (Ecstasy)Ur Screen: NOT DETECTED
Methadone Scn, Ur: NOT DETECTED
Opiate, Ur Screen: NOT DETECTED
Phencyclidine (PCP) Ur S: NOT DETECTED
Tricyclic, Ur Screen: NOT DETECTED

## 2021-09-17 LAB — URINALYSIS, COMPLETE (UACMP) WITH MICROSCOPIC
Bacteria, UA: NONE SEEN
Bilirubin Urine: NEGATIVE
Glucose, UA: NEGATIVE mg/dL
Hgb urine dipstick: NEGATIVE
Ketones, ur: NEGATIVE mg/dL
Leukocytes,Ua: NEGATIVE
Nitrite: NEGATIVE
Protein, ur: NEGATIVE mg/dL
RBC / HPF: NONE SEEN RBC/hpf (ref 0–5)
Specific Gravity, Urine: 1.01 (ref 1.005–1.030)
WBC, UA: NONE SEEN WBC/hpf (ref 0–5)
pH: 7 (ref 5.0–8.0)

## 2021-09-17 LAB — PROTIME-INR
INR: 1.3 — ABNORMAL HIGH (ref 0.8–1.2)
Prothrombin Time: 15.8 seconds — ABNORMAL HIGH (ref 11.4–15.2)

## 2021-09-17 LAB — HEPATITIS PANEL, ACUTE
HCV Ab: NONREACTIVE
Hep A IgM: NONREACTIVE
Hep B C IgM: NONREACTIVE
Hepatitis B Surface Ag: NONREACTIVE

## 2021-09-17 LAB — LIPASE, BLOOD: Lipase: 39 U/L (ref 11–51)

## 2021-09-17 LAB — AMMONIA: Ammonia: 59 umol/L — ABNORMAL HIGH (ref 9–35)

## 2021-09-17 LAB — MAGNESIUM: Magnesium: 1.7 mg/dL (ref 1.7–2.4)

## 2021-09-17 LAB — ACETAMINOPHEN LEVEL: Acetaminophen (Tylenol), Serum: <10 ug/mL — ABNORMAL LOW (ref 10–30)

## 2021-09-17 MED ORDER — IBUPROFEN 800 MG PO TABS: 800.0000 mg | ORAL_TABLET | Freq: Three times a day (TID) | ORAL | Status: DC | PRN

## 2021-09-17 MED ORDER — LORAZEPAM 2 MG/ML IJ SOLN
0.0000 mg | Freq: Four times a day (QID) | INTRAMUSCULAR | Status: DC
Start: 2021-09-17 — End: 2021-09-18
  Administered 2021-09-18: 1 mg via INTRAVENOUS
  Filled 2021-09-17: qty 1

## 2021-09-17 MED ORDER — PANTOPRAZOLE SODIUM 40 MG PO TBEC
40.0000 mg | DELAYED_RELEASE_TABLET | Freq: Every day | ORAL | Status: DC
  Administered 2021-09-17 – 2021-09-18 (×2): 40 mg via ORAL
  Filled 2021-09-17 (×2): qty 1

## 2021-09-17 MED ORDER — VITAMIN D 25 MCG (1000 UNIT) PO TABS
5000.0000 [IU] | ORAL_TABLET | ORAL | Status: DC
  Administered 2021-09-18: 5000 [IU] via ORAL
  Filled 2021-09-17: qty 5

## 2021-09-17 MED ORDER — LORAZEPAM 2 MG/ML IJ SOLN: 0.0000 mg | Freq: Two times a day (BID) | INTRAMUSCULAR | Status: DC

## 2021-09-17 MED ORDER — LACTULOSE 10 GM/15ML PO SOLN
30.0000 g | Freq: Two times a day (BID) | ORAL | Status: DC
  Administered 2021-09-17 – 2021-09-18 (×2): 30 g via ORAL
  Filled 2021-09-17 (×3): qty 60

## 2021-09-17 MED ORDER — ADULT MULTIVITAMIN W/MINERALS CH
1.0000 | ORAL_TABLET | Freq: Every day | ORAL | Status: DC
  Administered 2021-09-17 – 2021-09-18 (×2): 1 via ORAL
  Filled 2021-09-17 (×2): qty 1

## 2021-09-17 MED ORDER — LORAZEPAM 2 MG PO TABS: 0.0000 mg | ORAL_TABLET | Freq: Two times a day (BID) | ORAL | Status: DC

## 2021-09-17 MED ORDER — OYSTER SHELL CALCIUM/D3 500-5 MG-MCG PO TABS
1.0000 | ORAL_TABLET | Freq: Two times a day (BID) | ORAL | Status: DC
  Administered 2021-09-17 – 2021-09-18 (×3): 1 via ORAL
  Filled 2021-09-17 (×3): qty 1

## 2021-09-17 MED ORDER — POTASSIUM CHLORIDE CRYS ER 20 MEQ PO TBCR
40.0000 meq | EXTENDED_RELEASE_TABLET | ORAL | Status: AC
  Administered 2021-09-17 (×2): 40 meq via ORAL
  Filled 2021-09-17 (×2): qty 2

## 2021-09-17 MED ORDER — THIAMINE HCL 100 MG/ML IJ SOLN: 100.0000 mg | Freq: Every day | INTRAMUSCULAR | Status: DC

## 2021-09-17 MED ORDER — FLUOROMETHOLONE 0.1 % OP SUSP
1.0000 [drp] | Freq: Three times a day (TID) | OPHTHALMIC | Status: DC | PRN
  Filled 2021-09-17: qty 5

## 2021-09-17 MED ORDER — FLUOROMETHOLONE 0.1 % OP SUSP
1.0000 [drp] | Freq: Two times a day (BID) | OPHTHALMIC | Status: DC
  Filled 2021-09-17: qty 5

## 2021-09-17 MED ORDER — LORATADINE 10 MG PO TABS
10.0000 mg | ORAL_TABLET | Freq: Every day | ORAL | Status: DC
  Administered 2021-09-17: 10 mg via ORAL
  Filled 2021-09-17 (×2): qty 1

## 2021-09-17 MED ORDER — IBUPROFEN 400 MG PO TABS
200.0000 mg | ORAL_TABLET | Freq: Once | ORAL | Status: AC
Start: 2021-09-17 — End: 2021-09-17
  Administered 2021-09-17: 200 mg via ORAL
  Filled 2021-09-17: qty 1

## 2021-09-17 MED ORDER — LORAZEPAM 2 MG PO TABS
0.0000 mg | ORAL_TABLET | Freq: Four times a day (QID) | ORAL | Status: DC
  Administered 2021-09-17: 1 mg via ORAL
  Administered 2021-09-17: 2 mg via ORAL
  Filled 2021-09-17 (×2): qty 1

## 2021-09-17 MED ORDER — ENOXAPARIN SODIUM 40 MG/0.4ML IJ SOSY: 40.0000 mg | PREFILLED_SYRINGE | INTRAMUSCULAR | Status: DC

## 2021-09-17 MED ORDER — MELATONIN 5 MG PO TABS
5.0000 mg | ORAL_TABLET | Freq: Every day | ORAL | Status: DC
  Administered 2021-09-17: 5 mg via ORAL
  Filled 2021-09-17: qty 1

## 2021-09-17 MED ORDER — THIAMINE HCL 100 MG PO TABS
100.0000 mg | ORAL_TABLET | Freq: Every day | ORAL | Status: DC
  Administered 2021-09-17 – 2021-09-18 (×2): 100 mg via ORAL
  Filled 2021-09-17: qty 1

## 2021-09-17 NOTE — ED Notes (Signed)
Ambulatory to bathroom. Remains mildly anxious at this time. VSS.

## 2021-09-17 NOTE — H&P (Signed)
History and Physical    Jamie Coleman:[id number] DOB: [birth year] DOA: 09/17/2021  PCP: Jamie Greenspan, MD (Confirm with patient/family/NH records and if not entered, this has to be entered at [facility] point of entry) Patient coming from: [facility]  I have personally briefly reviewed patient's old medical records in [place]  Chief Complaint: Turning yellow  HPI: Jamie Coleman is a 50 y.o. female with medical history significant of alcohol abuse, chronic transaminitis, obesity, pre-diabetes, came with noticeable jaundice.  She started to notice yellow discoloration on the conjunctiva about 2 days ago, but denied any abdominal pain, no N/V or diarrhea, and no fever. She did not notice any stool color changes or urine discoloration recently. No itchiness. She is a heavy drinker, drinks liquor 6 shots daily for last three years. No new medication recently.  ED Course: Vital signs borderline tachy, no fever, no hypotension.  Blood work showed elevated liver enzymes AST 258, ALT 64, Bili 3.8, ALB 3.4, INR 1.3.  RUQ U/S showed hepatomegaly and fatty infiltrates.  Review of Systems: As per HPI otherwise 14 point review of systems negative.    Past Medical History:  Diagnosis Date   Allergy    allergic rhinitis   Anxiety    situational   COVID 05/2021   Difficult intubation    states 2012 ablation surgery, had diff intubation,glide scope used, not noted in EPIC anesthesia record   Elevated hemoglobin A1c 08/08/2015   Elevated liver function tests 08/08/2015   GERD (gastroesophageal reflux disease)    Hyperlipidemia    Low serum vitamin D 08/08/2015   Status post endometrial ablation    Tennis elbow    currently in PT-10/16    Past Surgical History:  Procedure Laterality Date   CESAREAN SECTION     ENDOMETRIAL ABLATION     8/12   EXTREMITY WIRE/PIN REMOVAL Left 07/16/2016   Procedure: PIN REMOVAL OF LEFT WRIST X 2;  Surgeon: Roseanne Kaufman, MD;  Location: [place];  Service: Orthopedics;  Laterality: Left;   FOOT SURGERY     LAPAROSCOPIC TUBAL LIGATION  06/09/2011   Procedure: LAPAROSCOPIC TUBAL LIGATION;  Surgeon: Arloa Koh;  Location: [facility];  Service: Gynecology;  Laterality: N/A;   LIGAMENT REPAIR Left 05/08/2016   Procedure: LEFT WRIST SCAPHOLUNATE LIGAMENT RECONSTRUCTION WITH TENDON GRAFT PIN NEURECTOMY AND REPAIR;  Surgeon: Roseanne Kaufman, MD;  Location: [place];  Service: Orthopedics;  Laterality: Left;   TONSILLECTOMY     as child   TUBAL LIGATION  8/12     reports that she quit smoking about 38 years ago. Her smoking use included cigarettes. She has never used smokeless tobacco. She reports current alcohol use of about 14.0 standard drinks per week. She reports that she does not use drugs.  Allergies  Allergen Reactions   Lipitor [Atorvastatin]     myalgias    Family History  Problem Relation Age of Onset   Cancer Mother        CA insitu of appendix   Heart disease Father        CAD   Diabetes Father        type II   Alzheimer's disease Father    Diabetes Brother        type II   Diabetes Maternal Grandfather    Diabetes Paternal Grandmother    Diabetes Paternal Grandfather    Breast cancer Neg Hx      Prior to Admission  medications   Medication Sig Start Date End Date Taking? Authorizing Provider  ALPRAZolam Duanne Moron) 0.5 MG tablet Take 0.5 mg by mouth at bedtime as needed for anxiety.   Yes [provider]  Ascorbic Acid (VITAMIN C) 1000 MG tablet Take 1,000 mg by mouth daily.   Yes [provider]  Calcium Carbonate-Vit D-Min 600-400 MG-UNIT TABS Take 1 tablet by mouth 2 (two) times daily.    Yes [provider]  Cholecalciferol (VITAMIN D3) 5000 units TABS Take 1 tablet by mouth every other day.   Yes [provider]  fexofenadine (ALLEGRA) 180 MG tablet Take 180 mg by mouth daily.   Yes [provider]  fluorometholone (FML LIQUIFILM) 0.1 %  ophthalmic suspension Place 1 drop into both eyes once a day as needed 07/23/21  Yes   fluorometholone (FML) 0.1 % ophthalmic suspension Place 1 drop into both eyes 3 (three) times daily as needed. 03/13/20  Yes Hortencia Pilar, MD  ibuprofen (ADVIL,MOTRIN) 800 MG tablet Take 1 tablet (800 mg total) by mouth every 8 (eight) hours as needed (with food). 04/09/15  Yes Tower, Wynelle Fanny, MD  Multiple Vitamin (MULTIVITAMIN) capsule Take 1 capsule by mouth daily.     Yes [provider]  Omega-3 Fatty Acids (FISH OIL PO) Take by mouth daily.     Yes [provider]  omeprazole (PRILOSEC) 20 MG capsule Take 20 mg by mouth daily.   Yes [provider]    Physical Exam: Vitals:   09/17/21 1200 09/17/21 1215 09/17/21 1230 09/17/21 1245  BP: 118/76  (!) 114/91   Pulse: (!) 109  (!) 109   Resp: 17 (!) 23  (!) 21  Temp:      TempSrc:      SpO2: 95%  96%     Constitutional: NAD, calm, comfortable Vitals:   09/17/21 1200 09/17/21 1215 09/17/21 1230 09/17/21 1245  BP: 118/76  (!) 114/91   Pulse: (!) 109  (!) 109   Resp: 17 (!) 23  (!) 21  Temp:      TempSrc:      SpO2: 95%  96%    Eyes: PERRL, lconjunctivae yellowish ENMT: Mucous membranes are moist. Posterior pharynx clear of any exudate or lesions.Normal dentition.  Neck: normal, supple, no masses, no thyromegaly Respiratory: clear to auscultation bilaterally, no wheezing, no crackles. Normal respiratory effort. No accessory muscle use.  Cardiovascular: Regular rate and rhythm, no murmurs / rubs / gallops. No extremity edema. 2+ pedal pulses. No carotid bruits.  Abdomen: no tenderness, no masses palpated. No hepatosplenomegaly. Bowel sounds positive.  Musculoskeletal: no clubbing / cyanosis. No joint deformity upper and lower extremities. Good ROM, no contractures. Normal muscle tone.  Skin: no rashes, lesions, ulcers. No induration Neurologic: CN 2-12 grossly intact. Sensation intact, DTR normal. Strength 5/5 in  all 4.  Psychiatric: Normal judgment and insight. Alert and oriented x 3. Normal mood.     Labs on Admission: I have personally reviewed following labs and imaging studies  CBC: Recent Labs  Lab 09/17/21 0924  WBC 7.2  NEUTROABS 5.3  HGB 13.6  HCT 39.1  MCV 104.0*  PLT 443   Basic Metabolic Panel: Recent Labs  Lab 09/17/21 0924  NA 133*  K 3.1*  CL 97*  CO2 24  GLUCOSE 127*  BUN <5*  CREATININE 0.42*  CALCIUM 8.9   GFR: CrCl cannot be calculated (Unknown ideal weight.). Liver Function Tests: Recent Labs  Lab 09/17/21 0924  AST 258*  ALT 64*  ALKPHOS 245*  BILITOT 6.4*  PROT 7.5  ALBUMIN 3.4*   Recent Labs  Lab 09/17/21 0924  LIPASE 39   Recent Labs  Lab 09/17/21 0924  AMMONIA 59*   Coagulation Profile: Recent Labs  Lab 09/17/21 0924  INR 1.3*   Cardiac Enzymes: No results for input(s): CKTOTAL, CKMB, CKMBINDEX, TROPONINI in the last 168 hours. BNP (last 3 results) No results for input(s): PROBNP in the last 8760 hours. HbA1C: No results for input(s): HGBA1C in the last 72 hours. CBG: No results for input(s): GLUCAP in the last 168 hours. Lipid Profile: No results for input(s): CHOL, HDL, LDLCALC, TRIG, CHOLHDL, LDLDIRECT in the last 72 hours. Thyroid Function Tests: No results for input(s): TSH, T4TOTAL, FREET4, T3FREE, THYROIDAB in the last 72 hours. Anemia Panel: No results for input(s): VITAMINB12, FOLATE, FERRITIN, TIBC, IRON, RETICCTPCT in the last 72 hours. Urine analysis:    Component Value Date/Time   COLORURINE YELLOW 09/17/2021 1017   APPEARANCEUR CLEAR 09/17/2021 1017   LABSPEC 1.010 09/17/2021 1017   PHURINE 7.0 09/17/2021 1017   GLUCOSEU NEGATIVE 09/17/2021 1017   HGBUR NEGATIVE 09/17/2021 1017   BILIRUBINUR NEGATIVE 09/17/2021 1017   BILIRUBINUR n 08/19/2016 1658   KETONESUR NEGATIVE 09/17/2021 1017   PROTEINUR NEGATIVE 09/17/2021 1017   UROBILINOGEN 0.2 07/15/2020 1618   NITRITE NEGATIVE 09/17/2021 1017    LEUKOCYTESUR NEGATIVE 09/17/2021 1017    Radiological Exams on Admission: US BREAST LTD UNI RIGHT INC AXILLA  Result Date: 09/15/2021 CLINICAL DATA:  Follow-up for probably benign mass of the RIGHT periareolar breast. This probably benign finding was originally identified on diagnostic mammogram and ultrasound dated 09/13/2020. EXAM: DIGITAL DIAGNOSTIC BILATERAL MAMMOGRAM WITH TOMOSYNTHESIS AND CAD; ULTRASOUND RIGHT BREAST LIMITED TECHNIQUE: Bilateral digital diagnostic mammography and breast tomosynthesis was performed. The images were evaluated with computer-aided detection.; Targeted ultrasound examination of the right breast was performed COMPARISON:  Previous exams including most recent RIGHT breast ultrasound dated 08/04/2021. ACR Breast Density Category c: The breast tissue is heterogeneously dense, which may obscure small masses. FINDINGS: There are no new dominant masses, suspicious calcifications or secondary signs of malignancy within either breast. Targeted ultrasound is performed, again showing an oval hypoechoic mass localized to the skin of the RIGHT breast at the 8 o'clock axis, 1 cm from the nipple, measuring 4 mm, stable and most likely a Montgomery gland cyst or sebaceous cyst. IMPRESSION: 1. Stable probably benign intradermal mass of the RIGHT breast at the 8 o'clock axis, 1 cm from the nipple, measuring 4 mm, most likely a complicated Montgomery gland or sebaceous cyst. Recommend additional follow-up diagnostic mammogram and ultrasound in 12 months to ensure 2 year stability. 2. No evidence of malignancy within the LEFT breast. RECOMMENDATION: Bilateral diagnostic mammogram, and RIGHT breast ultrasound, in 12 months. I have discussed the findings and recommendations with the patient. If applicable, a reminder letter will be sent to the patient regarding the next appointment. BI-RADS CATEGORY  3: Probably benign. Electronically Signed   By: Franki Cabot M.D.   On: 09/15/2021 15:30  MM  DIAG BREAST TOMO BILATERAL  Result Date: 09/15/2021 CLINICAL DATA:  Follow-up for probably benign mass of the RIGHT periareolar breast. This probably benign finding was originally identified on diagnostic mammogram and ultrasound dated 09/13/2020. EXAM: DIGITAL DIAGNOSTIC BILATERAL MAMMOGRAM WITH TOMOSYNTHESIS AND CAD; ULTRASOUND RIGHT BREAST LIMITED TECHNIQUE: Bilateral digital diagnostic mammography and breast tomosynthesis was performed. The images were evaluated with computer-aided detection.; Targeted ultrasound examination of the right breast was  performed COMPARISON:  Previous exams including most recent RIGHT breast ultrasound dated 08/04/2021. ACR Breast Density Category c: The breast tissue is heterogeneously dense, which may obscure small masses. FINDINGS: There are no new dominant masses, suspicious calcifications or secondary signs of malignancy within either breast. Targeted ultrasound is performed, again showing an oval hypoechoic mass localized to the skin of the RIGHT breast at the 8 o'clock axis, 1 cm from the nipple, measuring 4 mm, stable and most likely a Montgomery gland cyst or sebaceous cyst. IMPRESSION: 1. Stable probably benign intradermal mass of the RIGHT breast at the 8 o'clock axis, 1 cm from the nipple, measuring 4 mm, most likely a complicated Montgomery gland or sebaceous cyst. Recommend additional follow-up diagnostic mammogram and ultrasound in 12 months to ensure 2 year stability. 2. No evidence of malignancy within the LEFT breast. RECOMMENDATION: Bilateral diagnostic mammogram, and RIGHT breast ultrasound, in 12 months. I have discussed the findings and recommendations with the patient. If applicable, a reminder letter will be sent to the patient regarding the next appointment. BI-RADS CATEGORY  3: Probably benign. Electronically Signed   By: Franki Cabot M.D.   On: 09/15/2021 15:30  US Abdomen Limited RUQ (LIVER/GB)  Result Date: 09/17/2021 CLINICAL DATA:  Painless  jaundice EXAM: ULTRASOUND ABDOMEN LIMITED RIGHT UPPER QUADRANT COMPARISON:  None. FINDINGS: Gallbladder: No gallstones. Mild gallbladder wall thickening measuring up to 4 mm. No sonographic Murphy sign noted by sonographer. Common bile duct: Diameter: 2 mm Liver: No focal lesion identified. Hepatomegaly. Increased parenchymal echogenicity. Portal vein is patent on color Doppler imaging with normal direction of blood flow towards the liver. Other: None. IMPRESSION: 1. Mild gallbladder wall thickening measuring up to 4 mm. No cholelithiasis. Negative sonographic Murphy sign. 2. No biliary ductal dilatation. 3. Hepatomegaly and hepatic steatosis. 4. Consider CT or MRI to further evaluate otherwise unexplained jaundice. Electronically Signed   By: Delanna Ahmadi M.D.   On: 09/17/2021 12:45    EKG: Independently reviewed. Sinus  Assessment/Plan Principal Problem:   Alcoholic hepatitis  (please populate well all problems here in Problem List. (For example, if patient is on BP meds at home and you resume or decide to hold them, it is a problem that needs to be her. Same for CAD, COPD, HLD and so on)  Acute on chronic alcoholic hepatitis -Discriminant factor=7.5, will monitor off steroid -Acute hepatitis panel pending -RUQ U/S no acute finding other than fatty liver likely from prolonged drinking. -Educated about obstinate from drinking and F/U  with hepatology -GI consulted.  Alcohol withdraw -Reportedly had tremors and tachy in ED and Ativan x1 given. Now symptoms improved. Continue CIWA protocol.  Acute liver failure -Mild synthetic dysfunction -Has mild elevation of ammonia but no S/S of acute encaphalopathy, will start Lactulose BID, check Ammonia level in AM. -Mild Coagulopathy with elevated INR, no bleeding signs.  Hypokalemia -PO replacement and check Mg and Phos level.  Pre-diabetes -Outpatient PCP f/u  Hyponatremia -Likely related Alcohol abuse, clinically euvolemic, trend Na level,   1800 ml/daily fluid restrictions.  Obesity -Calorie control DVT prophylaxis: Lovenox Code Status: Full code Family Communication: Husband at bedside Disposition Plan: Expect 2 days hospital stay to make sure LFTs plateaus  Consults called: GI Admission status: Medsurg admit with tele   Lequita Halt MD [facility] Pager [phone]  09/17/2021, 1:33 PM

## 2021-09-17 NOTE — ED Notes (Signed)
Pt in bed, pt reports decreased anxiety, pt reports a slight headache, states that she doesn't need anything for pain at this time, sig other at bedside, pt awaits [place] result.

## 2021-09-17 NOTE — ED Notes (Signed)
Pt in bed, pt denies abd pain, pt states that she is here because she was looking in her eyes and noticed some jaundice, state that her abd feels distended, abd soft.  IV placed, blood drawn and sent.

## 2021-09-17 NOTE — ED Provider Notes (Signed)
[facility]  ____________________________________________   Event Date/Time   First MD Initiated Contact with Patient 09/17/21 [phone]     (approximate)  I have reviewed the triage vital signs and the nursing notes.   HISTORY  Chief Complaint Jaundice    HPI Jamie Coleman is a 50 y.o. female with past medical history of alcohol use disorder, GERD who presents with jaundice.  Patient started to notice that her eyes were somewhat yellow yesterday and then this worsened today.  Also notes some jaundice of her skin.  She notes that her abdomen has been somewhat bloated over several weeks with no acute change.  She denies abdominal pain nausea vomiting diarrhea melena or blood in her stool or hematemesis.  Denies pale stools or dark urine.  She denies fevers or chills.  Did have a viral illness last week with a temp of 100.4 but this is resolved.  Patient asked 6 shots of liquor per day on the weekdays and 12 shots per day on the weekends.  She notes that the last time she stopped drinking was in August but she tapered down and did not have withdrawal.  Otherwise she has not stopped drinking for long periods of time so does not know if she has withdrawal.         Past Medical History:  Diagnosis Date   Allergy    allergic rhinitis   Anxiety    situational   COVID 05/2021   Difficult intubation    states 2012 ablation surgery, had diff intubation,glide scope used, not noted in EPIC anesthesia record   Elevated hemoglobin A1c 08/08/2015   Elevated liver function tests 08/08/2015   GERD (gastroesophageal reflux disease)    Hyperlipidemia    Low serum vitamin D 08/08/2015   Status post endometrial ablation    Tennis elbow    currently in PT-10/16    Patient Active Problem List   Diagnosis Date Noted   Routine general medical examination at a health care facility 08/19/2020   Elevated liver transaminase level 09/03/2019   Elevated TSH 09/03/2019   Skin  infection 08/30/2019   Vitamin D deficiency 03/02/2018   Fatigue 03/02/2018   Pupil asymmetry 08/04/2016   Hemorrhoids 12/28/2014   Prediabetes 08/28/2014   Rash and nonspecific skin eruption 08/26/2014   Tremor 06/30/2013   Sebaceous cyst 02/15/2013   OTHER&UNSPECIFIED DISEASES THE ORAL SOFT TISSUES 07/25/2008   DERMATOFIBROMA, ARM 11/11/2007   NEOPLASM, SKIN, UNCERTAIN BEHAVIOR 20/80/2233   Hyperlipidemia 02/10/2007   Anxiety disorder 02/10/2007   ALLERGIC RHINITIS 02/10/2007   GERD 02/10/2007   IBS 02/10/2007    Past Surgical History:  Procedure Laterality Date   CESAREAN SECTION     ENDOMETRIAL ABLATION     8/12   EXTREMITY WIRE/PIN REMOVAL Left 07/16/2016   Procedure: PIN REMOVAL OF LEFT WRIST X 2;  Surgeon: Roseanne Kaufman, MD;  Location: [place];  Service: Orthopedics;  Laterality: Left;   FOOT SURGERY     LAPAROSCOPIC TUBAL LIGATION  06/09/2011   Procedure: LAPAROSCOPIC TUBAL LIGATION;  Surgeon: Arloa Koh;  Location: [facility];  Service: Gynecology;  Laterality: N/A;   LIGAMENT REPAIR Left 05/08/2016   Procedure: LEFT WRIST SCAPHOLUNATE LIGAMENT RECONSTRUCTION WITH TENDON GRAFT PIN NEURECTOMY AND REPAIR;  Surgeon: Roseanne Kaufman, MD;  Location: [place];  Service: Orthopedics;  Laterality: Left;   TONSILLECTOMY     as child   TUBAL LIGATION  8/12    Prior to Admission  medications   Medication Sig Start Date End Date Taking? Authorizing Provider  ALPRAZolam Duanne Moron) 0.5 MG tablet Take 0.5 mg by mouth at bedtime as needed for anxiety.   Yes [provider]  Ascorbic Acid (VITAMIN C) 1000 MG tablet Take 1,000 mg by mouth daily.   Yes [provider]  Calcium Carbonate-Vit D-Min 600-400 MG-UNIT TABS Take 1 tablet by mouth 2 (two) times daily.    Yes [provider]  Cholecalciferol (VITAMIN D3) 5000 units TABS Take 1 tablet by mouth every other day.   Yes [provider]  fexofenadine (ALLEGRA) 180 MG  tablet Take 180 mg by mouth daily.   Yes [provider]  fluorometholone (FML LIQUIFILM) 0.1 % ophthalmic suspension Place 1 drop into both eyes once a day as needed 07/23/21  Yes   fluorometholone (FML) 0.1 % ophthalmic suspension Place 1 drop into both eyes 3 (three) times daily as needed. 03/13/20  Yes Hortencia Pilar, MD  ibuprofen (ADVIL,MOTRIN) 800 MG tablet Take 1 tablet (800 mg total) by mouth every 8 (eight) hours as needed (with food). 04/09/15  Yes Tower, Wynelle Fanny, MD  Multiple Vitamin (MULTIVITAMIN) capsule Take 1 capsule by mouth daily.     Yes [provider]  Omega-3 Fatty Acids (FISH OIL PO) Take by mouth daily.     Yes [provider]  omeprazole (PRILOSEC) 20 MG capsule Take 20 mg by mouth daily.   Yes [provider]    Allergies Lipitor [atorvastatin]  Family History  Problem Relation Age of Onset   Cancer Mother        CA insitu of appendix   Heart disease Father        CAD   Diabetes Father        type II   Alzheimer's disease Father    Diabetes Brother        type II   Diabetes Maternal Grandfather    Diabetes Paternal Grandmother    Diabetes Paternal Grandfather    Breast cancer Neg Hx     Social History Social History   Tobacco Use   Smoking status: Former    Types: Cigarettes    Quit date: 07/25/1983    Years since quitting: 38.1   Smokeless tobacco: Never   Tobacco comments:    in high school  Vaping Use   Vaping Use: Never used  Substance Use Topics   Alcohol use: Yes    Alcohol/week: 14.0 standard drinks    Types: 14 Glasses of wine per week    Comment: 6 shots daily 12 shots daily on weekends   Drug use: No    Review of Systems   Review of Systems  Constitutional:  Negative for appetite change, chills and fever.  Respiratory:  Negative for shortness of breath.   Gastrointestinal:  Positive for abdominal distention. Negative for abdominal pain, blood in stool, constipation, diarrhea, nausea and  vomiting.  All other systems reviewed and are negative.  Physical Exam Updated Vital Signs BP (!) 114/91   Pulse (!) 109   Temp 98.8 F (37.1 C) (Oral)   Resp (!) 21   SpO2 96%   Physical Exam Vitals and nursing note reviewed.  Constitutional:      General: She is not in acute distress.    Appearance: Normal appearance.  HENT:     Head: Normocephalic and atraumatic.  Eyes:     General: Scleral icterus present.     Conjunctiva/sclera: Conjunctivae normal.  Cardiovascular:  Rate and Rhythm: Regular rhythm. Tachycardia present.  Pulmonary:     Effort: Pulmonary effort is normal. No respiratory distress.     Breath sounds: No stridor.  Abdominal:     General: There is distension.     Palpations: Abdomen is soft.     Tenderness: There is no abdominal tenderness. There is no guarding.     Comments: No palpable liver or spleen  Musculoskeletal:        General: No deformity or signs of injury.     Cervical back: Normal range of motion.  Skin:    General: Skin is dry.     Coloration: Skin is not jaundiced or pale.     Comments: Faintly jaundice in the upper extremities and face  Neurological:     General: No focal deficit present.     Mental Status: She is alert and oriented to person, place, and time. Mental status is at baseline.  Psychiatric:        Mood and Affect: Mood normal.        Behavior: Behavior normal.     LABS (all labs ordered are listed, but only abnormal results are displayed)  Labs Reviewed  CBC WITH DIFFERENTIAL/PLATELET - Abnormal; Notable for the following components:      Result Value   RBC 3.76 (*)    MCV 104.0 (*)    MCH 36.2 (*)    Monocytes Absolute 1.1 (*)    All other components within normal limits  HEPATIC FUNCTION PANEL - Abnormal; Notable for the following components:   Albumin 3.4 (*)    AST 258 (*)    ALT 64 (*)    Alkaline Phosphatase 245 (*)    Total Bilirubin 6.4 (*)    Bilirubin, Direct 3.8 (*)    Indirect Bilirubin 2.6  (*)    All other components within normal limits  PROTIME-INR - Abnormal; Notable for the following components:   Prothrombin Time 15.8 (*)    INR 1.3 (*)    All other components within normal limits  AMMONIA - Abnormal; Notable for the following components:   Ammonia 59 (*)    All other components within normal limits  ACETAMINOPHEN LEVEL - Abnormal; Notable for the following components:   Acetaminophen (Tylenol), Serum <10 (*)    All other components within normal limits  BASIC METABOLIC PANEL - Abnormal; Notable for the following components:   Sodium 133 (*)    Potassium 3.1 (*)    Chloride 97 (*)    Glucose, Bld 127 (*)    BUN <5 (*)    Creatinine, Ser 0.42 (*)    All other components within normal limits  RESP PANEL BY RT-PCR (FLU A&B, COVID) ARPGX2  LIPASE, BLOOD  URINALYSIS, COMPLETE (UACMP) WITH MICROSCOPIC  HEPATITIS PANEL, ACUTE   ____________________________________________  EKG  N/a ____________________________________________  RADIOLOGY Almeta Monas, personally viewed and evaluated these images (plain radiographs) as part of my medical decision making, as well as reviewing the written report by the radiologist.  ED MD interpretation: Reviewed the right upper quadrant ultrasound which does not show any biliary ductal dilatation, mild gallbladder wall thickening without stones    ____________________________________________   PROCEDURES  Procedure(s) performed (including Critical Care):  Procedures   ____________________________________________   INITIAL IMPRESSION / ASSESSMENT AND PLAN / ED COURSE     This patient is a 50 year old female with alcohol use disorder who presents with jaundice.  She has had some abdominal distention that has been  slowly progressive but denies abdominal pain nausea vomiting diarrhea or other acute symptoms.  Here she is tachycardic and somewhat hypertensive.  On exam she does have jaundice of the face and upper  extremities as well as mild scleral icterus.  She is tachycardic but not tremulous.  Her abdomen is soft and benign, no palpable liver and no right upper quadrant tenderness, may be somewhat distended but do not appreciate obvious ascites from exam alone.  I suspect that the patient's jaundice is related to her very heavy alcohol use, notes that she drinks 6-12 shots of liquor per day and is fairly consistent with this and has not stopped in quite some time.  We will send LFTs, hepatitis panel, Tylenol level and obtain a right upper quadrant ultrasound.  Will ultimately need admission.  Patient was placed on CIWA.  Patient did require dose of p.o. Ativan for anxiety has been somewhat tachycardic.  Patient's labs show a elevated total bilirubin to 6 with a direct bili of around 4, her AST and ALT are elevated to 60 and 64 with an alk phos of 245.  Pancreatic enzymes are normal.  INR is mildly elevated to 1.3.  She has macrocytosis as well normal hemoglobin and platelets.  Right upper quadrant ultrasound does not show any biliary dilatation, gallbladder wall is mild thickening no ascites.  Spoke with Dr. Alice Reichert with GI about any recommendations for further imaging and he felt that this was likely alcoholic hepatitis and that the best thing for her is supportive care and alcohol cessation.  Will admit to the hospital for monitoring of her LFTs and alcohol withdrawal.  Clinical Course as of 09/17/21 1256  Wed Sep 17, 2021  1010 INR(!): 1.3 [KM]    Clinical Course User Index [KM] Rada Hay, MD     ____________________________________________   FINAL CLINICAL IMPRESSION(S) / ED DIAGNOSES  Final diagnoses:  Jaundice  Alcohol withdrawal syndrome without complication ([place])  Alcoholic hepatitis, unspecified whether ascites present     ED Discharge Orders     None        Note:  This document was prepared using Dragon voice recognition software and may include unintentional dictation  errors.    Rada Hay, MD 09/17/21 1256

## 2021-09-17 NOTE — ED Notes (Signed)
Pt seen ambulatory from bathroom to room.  In NAD

## 2021-09-17 NOTE — ED Notes (Signed)
Pt changed into a gown, cardiac monitor placed, pt states that she feels anxious, ativan given, md notified, ultra sound at bedside, sig other at bedside.

## 2021-09-17 NOTE — ED Triage Notes (Signed)
Pt comes with c/o jaundice. Pt states her PCP sent her here. Pt denies any known liver issues.  Pt states some belly pain maybe ascites. Pt states she drinks 6 shots everyday during week and 12 shots on weekends. Pt states this has increased since covid.

## 2021-09-17 NOTE — Telephone Encounter (Signed)
Called pt and also advised her of Dr. Hanley Hays message and ER is recommended. Pt agrees and said she will head there now.

## 2021-09-18 ENCOUNTER — Other Ambulatory Visit (HOSPITAL_COMMUNITY): Payer: Self-pay

## 2021-09-18 DIAGNOSIS — Z20822 Contact with and (suspected) exposure to covid-19: Secondary | ICD-10-CM | POA: Diagnosis not present

## 2021-09-18 DIAGNOSIS — Z87891 Personal history of nicotine dependence: Secondary | ICD-10-CM | POA: Diagnosis not present

## 2021-09-18 DIAGNOSIS — Z79899 Other long term (current) drug therapy: Secondary | ICD-10-CM | POA: Diagnosis not present

## 2021-09-18 DIAGNOSIS — F10239 Alcohol dependence with withdrawal, unspecified: Secondary | ICD-10-CM | POA: Diagnosis not present

## 2021-09-18 DIAGNOSIS — K7010 Alcoholic hepatitis without ascites: Secondary | ICD-10-CM | POA: Diagnosis not present

## 2021-09-18 LAB — PROTIME-INR
INR: 1.3 — ABNORMAL HIGH (ref 0.8–1.2)
Prothrombin Time: 15.9 seconds — ABNORMAL HIGH (ref 11.4–15.2)

## 2021-09-18 LAB — COMPREHENSIVE METABOLIC PANEL
ALT: 49 U/L — ABNORMAL HIGH (ref 0–44)
AST: 172 U/L — ABNORMAL HIGH (ref 15–41)
Albumin: 2.7 g/dL — ABNORMAL LOW (ref 3.5–5.0)
Alkaline Phosphatase: 188 U/L — ABNORMAL HIGH (ref 38–126)
Anion gap: 10 (ref 5–15)
BUN: 5 mg/dL — ABNORMAL LOW (ref 6–20)
CO2: 26 mmol/L (ref 22–32)
Calcium: 8.7 mg/dL — ABNORMAL LOW (ref 8.9–10.3)
Chloride: 99 mmol/L (ref 98–111)
Creatinine, Ser: 0.37 mg/dL — ABNORMAL LOW (ref 0.44–1.00)
GFR, Estimated: >60 mL/min (ref >60)
Glucose, Bld: 96 mg/dL (ref 70–99)
Potassium: 3.7 mmol/L (ref 3.5–5.1)
Sodium: 135 mmol/L (ref 135–145)
Total Bilirubin: 6.4 mg/dL — ABNORMAL HIGH (ref 0.3–1.2)
Total Protein: 6.2 g/dL — ABNORMAL LOW (ref 6.5–8.1)

## 2021-09-18 LAB — HEPATITIS PANEL, ACUTE
HCV Ab: NONREACTIVE
Hep A IgM: NONREACTIVE
Hep B C IgM: NONREACTIVE
Hepatitis B Surface Ag: NONREACTIVE

## 2021-09-18 LAB — HIV ANTIBODY (ROUTINE TESTING W REFLEX): HIV Screen 4th Generation wRfx: NONREACTIVE

## 2021-09-18 LAB — AMMONIA: Ammonia: 58 umol/L — ABNORMAL HIGH (ref 9–35)

## 2021-09-18 MED ORDER — LORAZEPAM 0.5 MG PO TABS
0.5000 mg | ORAL_TABLET | Freq: Three times a day (TID) | ORAL | 0 refills | Status: DC | PRN
  Filled 2021-09-18: qty 30, 10d supply, fill #0

## 2021-09-18 MED ORDER — LACTULOSE 10 GM/15ML PO SOLN
30.0000 g | Freq: Two times a day (BID) | ORAL | 0 refills | Status: DC
Start: 2021-09-18 — End: 2021-09-24
  Filled 2021-09-18: qty 236, 2d supply, fill #0

## 2021-09-18 MED ORDER — THIAMINE HCL 100 MG PO TABS
100.0000 mg | ORAL_TABLET | Freq: Every day | ORAL | 1 refills | Status: DC
  Filled 2021-09-18: qty 90, 90d supply, fill #0

## 2021-09-18 NOTE — ED Notes (Signed)
RN informed bed assigned 

## 2021-09-18 NOTE — Discharge Summary (Signed)
Physician Discharge Summary  Jamie Coleman WNU:[id number] DOB: Sep 14, 1971 DOA: 09/17/2021  PCP: Abner Greenspan, MD  Admit date: 09/17/2021 Discharge date: 09/18/2021  Admitted From: Home Disposition: Home  Recommendations for Outpatient Follow-up:  Follow up with PCP in 1-2 weeks Please have your CMP checked on Monday Please have your INR checked on Monday Please obtain BMP/CBC in one week Please follow up on the following pending results: Acute hepatitis panel  Home Health: No Equipment/Devices: None Discharge Condition: Stable CODE STATUS: Full Diet recommendation: Heart Healthy /  Brief/Interim Summary: Jamie Coleman is a 50 y.o. female with medical history significant of alcohol abuse, chronic transaminitis, obesity, pre-diabetes, came with noticeable jaundice.   She started to notice yellow discoloration on the conjunctiva about 2 days ago, but denied any abdominal pain, no N/V or diarrhea, and no fever. She did not notice any stool color changes or urine discoloration recently. No itchiness. She is a heavy drinker, drinks liquor 6 shots daily for last three years. No new medication recently.  She was found to have elevated T bili at 6.4, INR of 1.4 and mildly elevated liver enzymes which started trending down next day.  Her T bili and INR remained stable.  No other symptoms.  Acetaminophen levels were undetectable.  Her CIWA score remained 0.  No prior history of alcohol withdrawal but she is at high risk.  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.  They requested to go home with some Ativan to be used as needed and promised to stay abstinent from alcohol.  She was able to stop alcohol for more than a week before this incidence and denies any withdrawal symptoms. She was discharged home on some Ativan, she will follow-up closely with her PCP for further recommendations.  Advised to have blood drawn on Monday. Her acute hepatitis panel were  pending, less likely to have any hepatitis A. Her PCP can follow-up on the results.  Right upper quadrant ultrasound with some fatty liver.  She had discriminant factor of 7.5 and does not need any steroid.  Most likely having alcoholic hepatitis.  We had a long discussion regarding the use of Ativan, we discontinue home Xanax and advised not to combine with alcohol and absolutely no alcoholic beverages.  Patient and husband both seems understanding.  Ammonia levels mildly elevated, patient was alert and oriented and appears to be at her baseline.  She was given some lactulose and can use it as needed.  Patient will need outpatient close follow-up with PCP and a gastroenterologist.  Discharge Diagnoses:  Principal Problem:   Alcoholic hepatitis Active Problems:   Alcohol withdrawal [facility])  Discharge Instructions  Discharge Instructions     Diet - low sodium heart healthy   Complete by: As directed    Discharge instructions   Complete by: As directed    It was pleasure taking care of you. You are being discharged at your request. You are being given some Ativan to be used if you noticed some shakiness or worsening anxiety due to alcohol withdrawal.  Do not combine with Xanax or alcohol. Avoid Tylenol and absolutely no alcohol so your liver can heal. Please have your liver enzymes and INR checked on Monday by your PCP. You can continue using lactulose and titrate the dose to have 2 soft bowel movements daily. Keep your self well-hydrated.   Increase activity slowly   Complete by: As directed       Allergies  as of 09/18/2021       Reactions   Lipitor [atorvastatin]    myalgias        Medication List     STOP taking these medications    ALPRAZolam 0.5 MG tablet Commonly known as: XANAX       TAKE these medications    Calcium Carbonate-Vit D-Min 600-400 MG-UNIT Tabs Take 1 tablet by mouth 2 (two) times daily.   fexofenadine 180 MG tablet Commonly known as:  ALLEGRA Take 180 mg by mouth daily.   FISH OIL PO Take by mouth daily.   fluorometholone 0.1 % ophthalmic suspension Commonly known as: FML Liquifilm Place 1 drop into both eyes once a day as needed What changed: Another medication with the same name was removed. Continue taking this medication, and follow the directions you see here.   ibuprofen 800 MG tablet Commonly known as: ADVIL Take 1 tablet (800 mg total) by mouth every 8 (eight) hours as needed (with food).   lactulose 10 GM/15ML solution Commonly known as: CHRONULAC Take 45 mLs (30 g total) by mouth 2 (two) times daily.   LORazepam 0.5 MG tablet Commonly known as: Ativan Take 1 tablet (0.5 mg total) by mouth every 8 (eight) hours as needed for anxiety.   multivitamin capsule Take 1 capsule by mouth daily.   omeprazole 20 MG capsule Commonly known as: PRILOSEC Take 20 mg by mouth daily.   thiamine 100 MG tablet Take 1 tablet (100 mg total) by mouth daily. Start taking on: September 19, 2021   vitamin C 1000 MG tablet Take 1,000 mg by mouth daily.   Vitamin D3 125 MCG (5000 UT) Tabs Take 1 tablet by mouth every other day.        Follow-up Information     Tower, Wynelle Fanny, MD. Schedule an appointment as soon as possible for a visit in 2 day(s).   Specialties: Family Medicine, Radiology Contact information: [place] [address] [phone]                Allergies  Allergen Reactions   Lipitor [Atorvastatin]     myalgias    Consultations: None  Procedures/Studies: US BREAST LTD UNI RIGHT INC AXILLA  Result Date: 09/15/2021 CLINICAL DATA:  Follow-up for probably benign mass of the RIGHT periareolar breast. This probably benign finding was originally identified on diagnostic mammogram and ultrasound dated 09/13/2020. EXAM: DIGITAL DIAGNOSTIC BILATERAL MAMMOGRAM WITH TOMOSYNTHESIS AND CAD; ULTRASOUND RIGHT BREAST LIMITED TECHNIQUE: Bilateral digital diagnostic mammography  and breast tomosynthesis was performed. The images were evaluated with computer-aided detection.; Targeted ultrasound examination of the right breast was performed COMPARISON:  Previous exams including most recent RIGHT breast ultrasound dated 08/04/2021. ACR Breast Density Category c: The breast tissue is heterogeneously dense, which may obscure small masses. FINDINGS: There are no new dominant masses, suspicious calcifications or secondary signs of malignancy within either breast. Targeted ultrasound is performed, again showing an oval hypoechoic mass localized to the skin of the RIGHT breast at the 8 o'clock axis, 1 cm from the nipple, measuring 4 mm, stable and most likely a Montgomery gland cyst or sebaceous cyst. IMPRESSION: 1. Stable probably benign intradermal mass of the RIGHT breast at the 8 o'clock axis, 1 cm from the nipple, measuring 4 mm, most likely a complicated Montgomery gland or sebaceous cyst. Recommend additional follow-up diagnostic mammogram and ultrasound in 12 months to ensure 2 year stability. 2. No evidence of malignancy within the LEFT breast. RECOMMENDATION: Bilateral  diagnostic mammogram, and RIGHT breast ultrasound, in 12 months. I have discussed the findings and recommendations with the patient. If applicable, a reminder letter will be sent to the patient regarding the next appointment. BI-RADS CATEGORY  3: Probably benign. Electronically Signed   By: Franki Cabot M.D.   On: 09/15/2021 15:30  MM DIAG BREAST TOMO BILATERAL  Result Date: 09/15/2021 CLINICAL DATA:  Follow-up for probably benign mass of the RIGHT periareolar breast. This probably benign finding was originally identified on diagnostic mammogram and ultrasound dated 09/13/2020. EXAM: DIGITAL DIAGNOSTIC BILATERAL MAMMOGRAM WITH TOMOSYNTHESIS AND CAD; ULTRASOUND RIGHT BREAST LIMITED TECHNIQUE: Bilateral digital diagnostic mammography and breast tomosynthesis was performed. The images were evaluated with computer-aided  detection.; Targeted ultrasound examination of the right breast was performed COMPARISON:  Previous exams including most recent RIGHT breast ultrasound dated 08/04/2021. ACR Breast Density Category c: The breast tissue is heterogeneously dense, which may obscure small masses. FINDINGS: There are no new dominant masses, suspicious calcifications or secondary signs of malignancy within either breast. Targeted ultrasound is performed, again showing an oval hypoechoic mass localized to the skin of the RIGHT breast at the 8 o'clock axis, 1 cm from the nipple, measuring 4 mm, stable and most likely a Montgomery gland cyst or sebaceous cyst. IMPRESSION: 1. Stable probably benign intradermal mass of the RIGHT breast at the 8 o'clock axis, 1 cm from the nipple, measuring 4 mm, most likely a complicated Montgomery gland or sebaceous cyst. Recommend additional follow-up diagnostic mammogram and ultrasound in 12 months to ensure 2 year stability. 2. No evidence of malignancy within the LEFT breast. RECOMMENDATION: Bilateral diagnostic mammogram, and RIGHT breast ultrasound, in 12 months. I have discussed the findings and recommendations with the patient. If applicable, a reminder letter will be sent to the patient regarding the next appointment. BI-RADS CATEGORY  3: Probably benign. Electronically Signed   By: Franki Cabot M.D.   On: 09/15/2021 15:30  US Abdomen Limited RUQ (LIVER/GB)  Result Date: 09/17/2021 CLINICAL DATA:  Painless jaundice EXAM: ULTRASOUND ABDOMEN LIMITED RIGHT UPPER QUADRANT COMPARISON:  None. FINDINGS: Gallbladder: No gallstones. Mild gallbladder wall thickening measuring up to 4 mm. No sonographic Murphy sign noted by sonographer. Common bile duct: Diameter: 2 mm Liver: No focal lesion identified. Hepatomegaly. Increased parenchymal echogenicity. Portal vein is patent on color Doppler imaging with normal direction of blood flow towards the liver. Other: None. IMPRESSION: 1. Mild gallbladder wall  thickening measuring up to 4 mm. No cholelithiasis. Negative sonographic Murphy sign. 2. No biliary ductal dilatation. 3. Hepatomegaly and hepatic steatosis. 4. Consider CT or MRI to further evaluate otherwise unexplained jaundice. Electronically Signed   By: Delanna Ahmadi M.D.   On: 09/17/2021 12:45    Subjective: Patient was seen and examined today.  No new complaint.  Denies any shakiness.  No nausea or vomiting.  Husband at bedside. 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. Patient denies any prior alcohol withdrawal symptoms when she was stayed abstinent for more than a week but at that time she is slowly decreased her intake and then stopped using it. Patient and husband were requesting some Ativan to be used as needed so they can go home and have a close follow-up with her primary care provider. We had a long discussion regarding staying about from alcohol to avoid further liver damage, the both since understanding and promised to work on it.  Discharge Exam: Vitals:   09/18/21 0900 09/18/21 1000  BP:  126/70 132/75  Pulse: 88 100  Resp: 18   Temp: 98.7 F (37.1 C)   SpO2: 100%    Vitals:   09/18/21 0600 09/18/21 0841 09/18/21 0900 09/18/21 1000  BP: 122/84  126/70 132/75  Pulse: 84  88 100  Resp: 16  18   Temp: 98.1 F (36.7 C)  98.7 F (37.1 C)   TempSrc: Oral  Oral   SpO2: 99%  100%   Weight:  80.8 kg    Height:  5\' 6"  (1.676 m)      General: Pt is alert, awake, not in acute distress Cardiovascular: RRR, S1/S2 +, no rubs, no gallops Respiratory: CTA bilaterally, no wheezing, no rhonchi Abdominal: Soft, NT, ND, bowel sounds + Extremities: no edema, no cyanosis   The results of significant diagnostics from this hospitalization (including imaging, microbiology, ancillary and laboratory) are listed below for reference.    Microbiology: Recent Results (from the past 240 hour(s))  Resp Panel by RT-PCR (Flu A&B, Covid) Nasopharyngeal  Swab     Status: None   Collection Time: 09/17/21 11:38 AM   Specimen: Nasopharyngeal Swab; Nasopharyngeal(NP) swabs in vial transport medium  Result Value Ref Range Status   SARS Coronavirus 2 by RT PCR NEGATIVE NEGATIVE Final    Comment: (NOTE) SARS-CoV-2 target nucleic acids are NOT DETECTED.  The SARS-CoV-2 RNA is generally detectable in upper respiratory specimens during the acute phase of infection. The lowest concentration of SARS-CoV-2 viral copies this assay can detect is 138 copies/mL. A negative result does not preclude SARS-Cov-2 infection and should not be used as the sole basis for treatment or other patient management decisions. A negative result may occur with  improper specimen collection/handling, submission of specimen other than nasopharyngeal swab, presence of viral mutation(s) within the areas targeted by this assay, and inadequate number of viral copies(<138 copies/mL). A negative result must be combined with clinical observations, patient history, and epidemiological information. The expected result is Negative.  Fact Sheet for Patients:  EntrepreneurPulse.com.au  Fact Sheet for Healthcare Providers:  IncredibleEmployment.be  This test is no t yet approved or cleared by the [place] FDA and  has been authorized for detection and/or diagnosis of SARS-CoV-2 by FDA under an Emergency Use Authorization (EUA). This EUA will remain  in effect (meaning this test can be used) for the duration of the COVID-19 declaration under Section 564(b)(1) of the Act, 21 U.S.C.section 360bbb-3(b)(1), unless the authorization is terminated  or revoked sooner.       Influenza A by PCR NEGATIVE NEGATIVE Final   Influenza B by PCR NEGATIVE NEGATIVE Final    Comment: (NOTE) The Xpert Xpress SARS-CoV-2/FLU/RSV plus assay is intended as an aid in the diagnosis of influenza from Nasopharyngeal swab specimens and should not be used as a sole  basis for treatment. Nasal washings and aspirates are unacceptable for Xpert Xpress SARS-CoV-2/FLU/RSV testing.  Fact Sheet for Patients: EntrepreneurPulse.com.au  Fact Sheet for Healthcare Providers: IncredibleEmployment.be  This test is not yet approved or cleared by the [place] FDA and has been authorized for detection and/or diagnosis of SARS-CoV-2 by FDA under an Emergency Use Authorization (EUA). This EUA will remain in effect (meaning this test can be used) for the duration of the COVID-19 declaration under Section 564(b)(1) of the Act, 21 U.S.C. section 360bbb-3(b)(1), unless the authorization is terminated or revoked.  Performed at [facility], [address] [place],  [address]      Labs: BNP (last 3 results) No results for  input(s): BNP in the last 8760 hours. Basic Metabolic Panel: Recent Labs  Lab 09/17/21 0924 09/18/21 0525  NA 133* 135  K 3.1* 3.7  CL 97* 99  CO2 24 26  GLUCOSE 127* 96  BUN <5* 5*  CREATININE 0.42* 0.37*  CALCIUM 8.9 8.7*  MG 1.7  --   PHOS 3.1  --    Liver Function Tests: Recent Labs  Lab 09/17/21 0924 09/18/21 0525  AST 258* 172*  ALT 64* 49*  ALKPHOS 245* 188*  BILITOT 6.4* 6.4*  PROT 7.5 6.2*  ALBUMIN 3.4* 2.7*   Recent Labs  Lab 09/17/21 0924  LIPASE 39   Recent Labs  Lab 09/17/21 0924 09/18/21 0525  AMMONIA 59* 58*   CBC: Recent Labs  Lab 09/17/21 0924  WBC 7.2  NEUTROABS 5.3  HGB 13.6  HCT 39.1  MCV 104.0*  PLT 167   Cardiac Enzymes: No results for input(s): CKTOTAL, CKMB, CKMBINDEX, TROPONINI in the last 168 hours. BNP: Invalid input(s): POCBNP CBG: No results for input(s): GLUCAP in the last 168 hours. D-Dimer No results for input(s): DDIMER in the last 72 hours. Hgb A1c No results for input(s): HGBA1C in the last 72 hours. Lipid Profile No results for input(s): CHOL, HDL, LDLCALC, TRIG, CHOLHDL, LDLDIRECT in the last 72  hours. Thyroid function studies No results for input(s): TSH, T4TOTAL, T3FREE, THYROIDAB in the last 72 hours.  Invalid input(s): FREET3 Anemia work up No results for input(s): VITAMINB12, FOLATE, FERRITIN, TIBC, IRON, RETICCTPCT in the last 72 hours. Urinalysis    Component Value Date/Time   COLORURINE YELLOW 09/17/2021 1017   APPEARANCEUR CLEAR 09/17/2021 1017   LABSPEC 1.010 09/17/2021 1017   PHURINE 7.0 09/17/2021 1017   GLUCOSEU NEGATIVE 09/17/2021 1017   HGBUR NEGATIVE 09/17/2021 1017   BILIRUBINUR NEGATIVE 09/17/2021 1017   BILIRUBINUR n 08/19/2016 1658   KETONESUR NEGATIVE 09/17/2021 1017   PROTEINUR NEGATIVE 09/17/2021 1017   UROBILINOGEN 0.2 07/15/2020 1618   NITRITE NEGATIVE 09/17/2021 1017   LEUKOCYTESUR NEGATIVE 09/17/2021 1017   Sepsis Labs Invalid input(s): PROCALCITONIN,  WBC,  LACTICIDVEN Microbiology Recent Results (from the past 240 hour(s))  Resp Panel by RT-PCR (Flu A&B, Covid) Nasopharyngeal Swab     Status: None   Collection Time: 09/17/21 11:38 AM   Specimen: Nasopharyngeal Swab; Nasopharyngeal(NP) swabs in vial transport medium  Result Value Ref Range Status   SARS Coronavirus 2 by RT PCR NEGATIVE NEGATIVE Final    Comment: (NOTE) SARS-CoV-2 target nucleic acids are NOT DETECTED.  The SARS-CoV-2 RNA is generally detectable in upper respiratory specimens during the acute phase of infection. The lowest concentration of SARS-CoV-2 viral copies this assay can detect is 138 copies/mL. A negative result does not preclude SARS-Cov-2 infection and should not be used as the sole basis for treatment or other patient management decisions. A negative result may occur with  improper specimen collection/handling, submission of specimen other than nasopharyngeal swab, presence of viral mutation(s) within the areas targeted by this assay, and inadequate number of viral copies(<138 copies/mL). A negative result must be combined with clinical observations, patient  history, and epidemiological information. The expected result is Negative.  Fact Sheet for Patients:  EntrepreneurPulse.com.au  Fact Sheet for Healthcare Providers:  IncredibleEmployment.be  This test is no t yet approved or cleared by the [place] FDA and  has been authorized for detection and/or diagnosis of SARS-CoV-2 by FDA under an Emergency Use Authorization (EUA). This EUA will remain  in effect (meaning this test can be  used) for the duration of the COVID-19 declaration under Section 564(b)(1) of the Act, 21 U.S.C.section 360bbb-3(b)(1), unless the authorization is terminated  or revoked sooner.       Influenza A by PCR NEGATIVE NEGATIVE Final   Influenza B by PCR NEGATIVE NEGATIVE Final    Comment: (NOTE) The Xpert Xpress SARS-CoV-2/FLU/RSV plus assay is intended as an aid in the diagnosis of influenza from Nasopharyngeal swab specimens and should not be used as a sole basis for treatment. Nasal washings and aspirates are unacceptable for Xpert Xpress SARS-CoV-2/FLU/RSV testing.  Fact Sheet for Patients: EntrepreneurPulse.com.au  Fact Sheet for Healthcare Providers: IncredibleEmployment.be  This test is not yet approved or cleared by the [place] FDA and has been authorized for detection and/or diagnosis of SARS-CoV-2 by FDA under an Emergency Use Authorization (EUA). This EUA will remain in effect (meaning this test can be used) for the duration of the COVID-19 declaration under Section 564(b)(1) of the Act, 21 U.S.C. section 360bbb-3(b)(1), unless the authorization is terminated or revoked.  Performed at [facility], [address] [place], [place] [address]     Time coordinating discharge: Over 30 minutes  SIGNED:  Lorella Nimrod, MD  [facility] 09/18/2021, 11:41 AM  If 7PM-7AM, please contact night-coverage [email or web address]  This record has been created  using Systems analyst. Errors have been sought and corrected,but may not always be located. Such creation errors do not reflect on the standard of care.

## 2021-09-19 ENCOUNTER — Telehealth: Payer: Self-pay

## 2021-09-19 DIAGNOSIS — R7303 Prediabetes: Secondary | ICD-10-CM

## 2021-09-19 DIAGNOSIS — K7010 Alcoholic hepatitis without ascites: Secondary | ICD-10-CM

## 2021-09-19 NOTE — Telephone Encounter (Signed)
Patient just wanted to know if she can get her labs checked before seen Dr Glori Bickers on 09/24/21 so they can discuss her results. She knows that she is suppose to repeat some levels but was not sure which ones.

## 2021-09-19 NOTE — Telephone Encounter (Signed)
Pt scheduled a lab appt on 12.12.22

## 2021-09-19 NOTE — Telephone Encounter (Signed)
I put lab orders in  That sounds good  Monday/early next week if able please

## 2021-09-19 NOTE — Telephone Encounter (Signed)
Please call pt and set up f/u with Dr Glori Bickers early next week

## 2021-09-19 NOTE — Addendum Note (Signed)
Addended by: Loura Pardon A on: 09/19/2021 12:14 PM   Modules accepted: Orders

## 2021-09-19 NOTE — Telephone Encounter (Signed)
Pt scheduled F/U  apt on 12.14.22 and also would like a phone call because she has ? About labs

## 2021-09-22 ENCOUNTER — Other Ambulatory Visit: Payer: Self-pay

## 2021-09-22 ENCOUNTER — Other Ambulatory Visit (INDEPENDENT_AMBULATORY_CARE_PROVIDER_SITE_OTHER): Payer: 59

## 2021-09-22 DIAGNOSIS — R7303 Prediabetes: Secondary | ICD-10-CM | POA: Diagnosis not present

## 2021-09-22 DIAGNOSIS — K7010 Alcoholic hepatitis without ascites: Secondary | ICD-10-CM | POA: Diagnosis not present

## 2021-09-22 LAB — HEPATIC FUNCTION PANEL
ALT: 38 U/L — ABNORMAL HIGH (ref 0–35)
AST: 150 U/L — ABNORMAL HIGH (ref 0–37)
Albumin: 3.3 g/dL — ABNORMAL LOW (ref 3.5–5.2)
Alkaline Phosphatase: 199 U/L — ABNORMAL HIGH (ref 39–117)
Bilirubin, Direct: 4.8 mg/dL — ABNORMAL HIGH (ref 0.0–0.3)
Total Bilirubin: 8.2 mg/dL — ABNORMAL HIGH (ref 0.2–1.2)
Total Protein: 6.8 g/dL (ref 6.0–8.3)

## 2021-09-22 LAB — BASIC METABOLIC PANEL
BUN: 5 mg/dL — ABNORMAL LOW (ref 6–23)
CO2: 26 mEq/L (ref 19–32)
Calcium: 9.1 mg/dL (ref 8.4–10.5)
Chloride: 100 mEq/L (ref 96–112)
Creatinine, Ser: 0.55 mg/dL (ref 0.40–1.20)
GFR: 105.83 mL/min (ref >60.00)
Glucose, Bld: 101 mg/dL — ABNORMAL HIGH (ref 70–99)
Potassium: 4.2 mEq/L (ref 3.5–5.1)
Sodium: 134 mEq/L — ABNORMAL LOW (ref 135–145)

## 2021-09-22 LAB — HEMOGLOBIN A1C: Hgb A1c MFr Bld: 5.6 % (ref 4.6–6.5)

## 2021-09-22 NOTE — Addendum Note (Signed)
Addended by: Loura Pardon A on: 09/22/2021 04:04 PM   Modules accepted: Orders

## 2021-09-24 ENCOUNTER — Ambulatory Visit: Payer: 59 | Admitting: Family Medicine

## 2021-09-24 ENCOUNTER — Other Ambulatory Visit (HOSPITAL_COMMUNITY): Payer: Self-pay

## 2021-09-24 ENCOUNTER — Other Ambulatory Visit: Payer: Self-pay

## 2021-09-24 ENCOUNTER — Encounter: Payer: Self-pay | Admitting: Family Medicine

## 2021-09-24 VITALS — BP 122/70 | HR 98 | Temp 97.7°F | Ht 66.0 in | Wt 181.2 lb

## 2021-09-24 DIAGNOSIS — F10930 Alcohol use, unspecified with withdrawal, uncomplicated: Secondary | ICD-10-CM

## 2021-09-24 DIAGNOSIS — R7401 Elevation of levels of liver transaminase levels: Secondary | ICD-10-CM

## 2021-09-24 DIAGNOSIS — F1020 Alcohol dependence, uncomplicated: Secondary | ICD-10-CM | POA: Insufficient documentation

## 2021-09-24 DIAGNOSIS — K7010 Alcoholic hepatitis without ascites: Secondary | ICD-10-CM | POA: Diagnosis not present

## 2021-09-24 DIAGNOSIS — F10280 Alcohol dependence with alcohol-induced anxiety disorder: Secondary | ICD-10-CM | POA: Diagnosis not present

## 2021-09-24 DIAGNOSIS — F413 Other mixed anxiety disorders: Secondary | ICD-10-CM | POA: Diagnosis not present

## 2021-09-24 MED ORDER — ALPRAZOLAM 0.5 MG PO TABS
0.5000 mg | ORAL_TABLET | Freq: Every evening | ORAL | 1 refills | Status: DC | PRN
  Filled 2021-09-24: qty 30, 30d supply, fill #0
  Filled 2021-12-02: qty 30, 30d supply, fill #1

## 2021-09-24 MED ORDER — ESCITALOPRAM OXALATE 10 MG PO TABS
10.0000 mg | ORAL_TABLET | Freq: Every day | ORAL | 3 refills | Status: DC
  Filled 2021-09-24: qty 30, 30d supply, fill #0

## 2021-09-24 NOTE — Assessment & Plan Note (Signed)
Recent etoh withdrawal- doing better Reviewed hospital records, lab results and studies in detail   Has abstained since 12/7  No tremor and per pt not craving badly  Declines 12 step program or focused etoh program Starting counseling soon and also to begin lexapro with close f/u

## 2021-09-24 NOTE — Patient Instructions (Addendum)
I want to check in to GI options for you   Re schedule your counseling appointment soon   Drink fluids Continue to abstain from alcohol   Labs today and plan from there  If symptoms worsen or change let me know   Start lexapro 10 mg daily  If intolerable side effects -hold it and let me know   Continue multi vitamin and thiamine

## 2021-09-24 NOTE — Assessment & Plan Note (Addendum)
Recent hospitalization  Reviewed hospital records, lab results and studies in detail  Took lactulose for elevated ammonia  (re check today) Discriminant factor was 7.5 -will re check and if necessary px prednisone  GI appt is scheduled jan 31-pt is interested in other options/to see if anything is available earlier with GI or hepatology Is abstaining and doing ok  Taking thiamine and mvi Avoiding liver toxins  Jaundice is unchanged and labs ordered After labs will be able to calculate DF and px prednisone if needed

## 2021-09-24 NOTE — Assessment & Plan Note (Signed)
No further w/d symptoms  Done with ativan (which she did not tolerate)

## 2021-09-24 NOTE — Assessment & Plan Note (Signed)
With jaundice /alcohlic hepatitis Abstaining  Lab today for LFT, bmet, ammonia and cbc and INR

## 2021-09-24 NOTE — Progress Notes (Signed)
Subjective:    Patient ID: Jamie Coleman, female    DOB: 1971/02/27, 50 y.o.   MRN: [id number]  This visit occurred during the SARS-CoV-2 public health emergency.  Safety protocols were in place, including screening questions prior to the visit, additional usage of staff PPE, and extensive cleaning of exam room while observing appropriate contact time as indicated for disinfecting solutions.   HPI Pt presents for f/u of hospitalization for jaundice/alcoholic hepatitis and withdrawal  Wt Readings from Last 3 Encounters:  09/24/21 181 lb 4 oz (82.2 kg)  09/18/21 178 lb 3.2 oz (80.8 kg)  07/16/21 176 lb (79.8 kg)   29.25 kg/m   Hospitalized from 12/7 to 09/18/21 She presented with acute jaundice and abd bloating Had viral illness prior with temp 100.4 that was resolved Reported 6 servings/shots of liquor daily in week and 12 on weekend    Hospital course:  She was found to have elevated T bili at 6.4, INR of 1.4 and mildly elevated liver enzymes which started trending down next day.  Her T bili and INR remained stable.  No other symptoms.  Acetaminophen levels were undetectable.  Her CIWA score remained 0.  No prior history of alcohol withdrawal but she is at high risk.  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.  They requested to go home with some Ativan to be used as needed and promised to stay abstinent from alcohol.  She was able to stop alcohol for more than a week before this incidence and denies any withdrawal symptoms. She was discharged home on some Ativan, she will follow-up closely with her PCP for further recommendations.  Advised to have blood drawn on Monday. Her acute hepatitis panel were pending, less likely to have any hepatitis A. Her PCP can follow-up on the results.  Right upper quadrant ultrasound with some fatty liver.  She had discriminant factor of 7.5 and does not need any steroid.  Most likely having alcoholic hepatitis.  She  was given ativan for w/d symptoms  Ammonia levels mildly elevated - given lactulose to use if needed but felt ok  UA report  US Abdomen Limited RUQ (LIVER/GB)   Result Date: 09/17/2021 CLINICAL DATA:  Painless jaundice EXAM: ULTRASOUND ABDOMEN LIMITED RIGHT UPPER QUADRANT COMPARISON:  None. FINDINGS: Gallbladder: No gallstones. Mild gallbladder wall thickening measuring up to 4 mm. No sonographic Murphy sign noted by sonographer. Common bile duct: Diameter: 2 mm Liver: No focal lesion identified. Hepatomegaly. Increased parenchymal echogenicity. Portal vein is patent on color Doppler imaging with normal direction of blood flow towards the liver. Other: None. IMPRESSION: 1. Mild gallbladder wall thickening measuring up to 4 mm. No cholelithiasis. Negative sonographic Murphy sign. 2. No biliary ductal dilatation. 3. Hepatomegaly and hepatic steatosis. 4. Consider CT or MRI to further evaluate otherwise unexplained jaundice. Electronically Signed   By: Delanna Ahmadi M.D.   On: 09/17/2021 12:45    Labs were repeated after hosp-LFT Bili up and AST and ALT down   Needs ref check with INR today   Lab Results  Component Value Date   ALT 38 (H) 09/22/2021   AST 150 (H) 09/22/2021   ALKPHOS 199 (H) 09/22/2021   BILITOT 8.2 (H) 09/22/2021    Lab Results  Component Value Date   WBC 7.2 09/17/2021   HGB 13.6 09/17/2021   HCT 39.1 09/17/2021   MCV 104.0 (H) 09/17/2021   PLT 167 09/17/2021   Lab Results  Component Value  Date   INR 1.3 (H) 09/18/2021   INR 1.3 (H) 09/17/2021   INR 1.0 10/16/2011   Hepatitis tests were negative   Wt Readings from Last 3 Encounters:  09/24/21 181 lb 4 oz (82.2 kg)  09/18/21 178 lb 3.2 oz (80.8 kg)  07/16/21 176 lb (79.8 kg)   29.25 kg/m   BP Readings from Last 3 Encounters:  09/24/21 122/70  09/18/21 132/75  07/16/21 122/64   Pulse Readings from Last 3 Encounters:  09/24/21 98  09/18/21 100  07/16/21 (!) 101   Feeling tired  No appetite   Anxious  No nausea   Was given ativan - it made her stumble around  [facility] for a few days at night (impairs her too much)-got rid of them  Does much better with xanax  She took the lactulose -much diarrhea (for ammonia levels)   Her jaundice is not better   Has appt with GI -January 31st  ([facility])   Feels down and anxious both   She had a counseling appt /new therapist - then missed appt due to the hospitalization   Stopped drinking totally  Husband stopped also for her  Not craving   No withdrawal symptoms  Not interested in 12 step pro gram right now   PGF was alcoholic   Taking thiamine  Also mvi  Patient Active Problem List   Diagnosis Date Noted   Alcohol dependence ([place]) 03/47/4259   Alcoholic hepatitis 56/38/7564   Alcohol withdrawal syndrome without complication ([place])    Routine general medical examination at a health care facility 08/19/2020   Elevated liver transaminase level 09/03/2019   Elevated TSH 09/03/2019   Vitamin D deficiency 03/02/2018   Fatigue 03/02/2018   Pupil asymmetry 08/04/2016   Hemorrhoids 12/28/2014   Prediabetes 08/28/2014   Tremor 06/30/2013   Sebaceous cyst 02/15/2013   OTHER&UNSPECIFIED DISEASES THE ORAL SOFT TISSUES 07/25/2008   DERMATOFIBROMA, ARM 11/11/2007   NEOPLASM, SKIN, UNCERTAIN BEHAVIOR 33/29/5188   Hyperlipidemia 02/10/2007   Anxiety disorder 02/10/2007   ALLERGIC RHINITIS 02/10/2007   GERD 02/10/2007   IBS 02/10/2007   Past Medical History:  Diagnosis Date   Allergy    allergic rhinitis   Anxiety    situational   COVID 05/2021   Difficult intubation    states 2012 ablation surgery, had diff intubation,glide scope used, not noted in EPIC anesthesia record   Elevated hemoglobin A1c 08/08/2015   Elevated liver function tests 08/08/2015   GERD (gastroesophageal reflux disease)    Hyperlipidemia    Low serum vitamin D 08/08/2015   Status post endometrial ablation    Tennis elbow    currently in PT-10/16    Past Surgical History:  Procedure Laterality Date   CESAREAN SECTION     ENDOMETRIAL ABLATION     8/12   EXTREMITY WIRE/PIN REMOVAL Left 07/16/2016   Procedure: PIN REMOVAL OF LEFT WRIST X 2;  Surgeon: Roseanne Kaufman, MD;  Location: [place];  Service: Orthopedics;  Laterality: Left;   FOOT SURGERY     LAPAROSCOPIC TUBAL LIGATION  06/09/2011   Procedure: LAPAROSCOPIC TUBAL LIGATION;  Surgeon: Arloa Koh;  Location: [facility];  Service: Gynecology;  Laterality: N/A;   LIGAMENT REPAIR Left 05/08/2016   Procedure: LEFT WRIST SCAPHOLUNATE LIGAMENT RECONSTRUCTION WITH TENDON GRAFT PIN NEURECTOMY AND REPAIR;  Surgeon: Roseanne Kaufman, MD;  Location: [facility];  Service: Orthopedics;  Laterality: Left;   TONSILLECTOMY     as child   TUBAL LIGATION  8/12   Social History   Tobacco Use   Smoking status: Former    Types: Cigarettes    Quit date: 07/25/1983    Years since quitting: 38.1   Smokeless tobacco: Never   Tobacco comments:    in high school  Vaping Use   Vaping Use: Never used  Substance Use Topics   Alcohol use: Yes    Alcohol/week: 14.0 standard drinks    Types: 14 Glasses of wine per week    Comment: 6 shots daily 12 shots daily on weekends   Drug use: No   Family History  Problem Relation Age of Onset   Cancer Mother        CA insitu of appendix   Heart disease Father        CAD   Diabetes Father        type II   Alzheimer's disease Father    Diabetes Brother        type II   Diabetes Maternal Grandfather    Diabetes Paternal Grandmother    Diabetes Paternal Grandfather    Breast cancer Neg Hx    Allergies  Allergen Reactions   Lipitor [Atorvastatin]     myalgias   Current Outpatient Medications on File Prior to Visit  Medication Sig Dispense Refill   Ascorbic Acid (VITAMIN C) 1000 MG tablet Take 1,000 mg by mouth daily.     Calcium Carbonate-Vit D-Min 600-400 MG-UNIT TABS Take 1 tablet by mouth daily.     Cholecalciferol  (VITAMIN D3) 5000 units TABS Take 1 tablet by mouth every other day.     fexofenadine (ALLEGRA) 180 MG tablet Take 180 mg by mouth daily as needed.     fluorometholone (FML LIQUIFILM) 0.1 % ophthalmic suspension Place 1 drop into both eyes once a day as needed 5 mL 1   ibuprofen (ADVIL,MOTRIN) 800 MG tablet Take 1 tablet (800 mg total) by mouth every 8 (eight) hours as needed (with food). 90 tablet 2   Multiple Vitamin (MULTIVITAMIN) capsule Take 1 capsule by mouth daily.       Omega-3 Fatty Acids (FISH OIL PO) Take by mouth daily.       omeprazole (PRILOSEC) 20 MG capsule Take 20 mg by mouth daily.     thiamine 100 MG tablet Take 1 tablet (100 mg total) by mouth daily. 90 tablet 1   No current facility-administered medications on file prior to visit.    Review of Systems  Constitutional:  Positive for fatigue. Negative for activity change, appetite change, fever and unexpected weight change.  HENT:  Negative for congestion, ear pain, rhinorrhea, sinus pressure and sore throat.   Eyes:  Negative for pain, redness and visual disturbance.  Respiratory:  Negative for cough, shortness of breath and wheezing.   Cardiovascular:  Negative for chest pain and palpitations.  Gastrointestinal:  Negative for abdominal pain, blood in stool, constipation and diarrhea.       Abdominal bloating  Endocrine: Negative for polydipsia and polyuria.  Genitourinary:  Negative for dysuria, frequency and urgency.  Musculoskeletal:  Negative for arthralgias, back pain and myalgias.  Skin:  Positive for color change. Negative for pallor and rash.  Allergic/Immunologic: Negative for environmental allergies.  Neurological:  Negative for dizziness, syncope and headaches.  Hematological:  Negative for adenopathy. Does not bruise/bleed easily.  Psychiatric/Behavioral:  Positive for dysphoric mood and sleep disturbance. Negative for decreased concentration and suicidal ideas. The patient is nervous/anxious.        Objective:  Physical Exam Constitutional:      General: She is not in acute distress.    Appearance: Normal appearance. She is not ill-appearing, toxic-appearing or diaphoretic.  HENT:     Head: Normocephalic and atraumatic.     Mouth/Throat:     Mouth: Mucous membranes are moist.     Pharynx: Oropharynx is clear.  Eyes:     General: Scleral icterus present.     Conjunctiva/sclera: Conjunctivae normal.     Pupils: Pupils are equal, round, and reactive to light.  Neck:     Vascular: No carotid bruit.  Cardiovascular:     Rate and Rhythm: Regular rhythm. Tachycardia present.     Heart sounds: Normal heart sounds.  Pulmonary:     Effort: Pulmonary effort is normal. No respiratory distress.     Breath sounds: Normal breath sounds. No wheezing.  Abdominal:     General: Abdomen is protuberant. Bowel sounds are normal. There is no distension or abdominal bruit.     Palpations: Abdomen is soft. There is no fluid wave, hepatomegaly, splenomegaly, mass or pulsatile mass.     Tenderness: There is no abdominal tenderness. There is no rebound.     Hernia: No hernia is present.  Musculoskeletal:     Cervical back: Normal range of motion and neck supple. No rigidity.  Lymphadenopathy:     Cervical: No cervical adenopathy.  Skin:    General: Skin is warm and dry.     Coloration: Skin is jaundiced.  Neurological:     Mental Status: She is alert.     Motor: No tremor.     Coordination: Coordination normal.     Deep Tendon Reflexes: Reflexes normal.  Psychiatric:        Attention and Perception: Attention normal.        Mood and Affect: Mood is anxious. Affect is tearful.        Behavior: Behavior normal.        Cognition and Memory: Cognition normal.     Comments: Talks candidly about her history and symptoms  Tearful at times           Assessment & Plan:   Problem List Items Addressed This Visit       Digestive   Alcoholic hepatitis - Primary    Recent hospitalization   Reviewed hospital records, lab results and studies in detail  Took lactulose for elevated ammonia  (re check today) Discriminant factor was 7.5 -will re check and if necessary px prednisone  GI appt is scheduled jan 31-pt is interested in other options/to see if anything is available earlier with GI or hepatology Is abstaining and doing ok  Taking thiamine and mvi Avoiding liver toxins  Jaundice is unchanged and labs ordered After labs will be able to calculate DF and px prednisone if needed       Relevant Orders   Hepatic function panel   Basic metabolic panel   CBC with Differential/Platelet   Protime-INR   Ammonia     Nervous and Auditory   Alcohol withdrawal syndrome without complication (HCC)    No further w/d symptoms  Done with ativan (which she did not tolerate)        Other   Anxiety disorder    Worsened, needs refill of xanax Reviewed stressors/ coping techniques/symptoms/ support sources/ tx options and side effects in detail today In setting of alcohol dependence and withdrawal Is abstaining  Open to idea of ssri -will start lexapro 10 Discussed expectations  of SSRI medication including time to effectiveness and mechanism of action, also poss of side effects (early and late)- including mental fuzziness, weight or appetite change, nausea and poss of worse dep or anxiety (even suicidal thoughts)  Pt voiced understanding and will stop med and update if this occurs  She will re schedule her therapy session  Will plan close f/u      Relevant Medications   escitalopram (LEXAPRO) 10 MG tablet   ALPRAZolam (XANAX) 0.5 MG tablet   Elevated liver transaminase level    With jaundice /alcohlic hepatitis Abstaining  Lab today for LFT, bmet, ammonia and cbc and INR      Relevant Orders   Hepatic function panel   Basic metabolic panel   CBC with Differential/Platelet   Protime-INR   Ammonia   Alcohol dependence ([place])    Recent etoh withdrawal- doing  better Reviewed hospital records, lab results and studies in detail   Has abstained since 12/7  No tremor and per pt not craving badly  Declines 12 step program or focused etoh program Starting counseling soon and also to begin lexapro with close f/u

## 2021-09-24 NOTE — Assessment & Plan Note (Signed)
Worsened, needs refill of xanax Reviewed stressors/ coping techniques/symptoms/ support sources/ tx options and side effects in detail today In setting of alcohol dependence and withdrawal Is abstaining  Open to idea of ssri -will start lexapro 10 Discussed expectations of SSRI medication including time to effectiveness and mechanism of action, also poss of side effects (early and late)- including mental fuzziness, weight or appetite change, nausea and poss of worse dep or anxiety (even suicidal thoughts)  Pt voiced understanding and will stop med and update if this occurs  She will re schedule her therapy session  Will plan close f/u

## 2021-09-25 ENCOUNTER — Other Ambulatory Visit (HOSPITAL_COMMUNITY): Payer: Self-pay

## 2021-09-25 LAB — PROTIME-INR
INR: 1.7 ratio — ABNORMAL HIGH (ref 0.8–1.0)
Prothrombin Time: 18.1 s — ABNORMAL HIGH (ref 9.6–13.1)

## 2021-09-25 LAB — CBC WITH DIFFERENTIAL/PLATELET
Basophils Absolute: 0.2 10*3/uL — ABNORMAL HIGH (ref 0.0–0.1)
Basophils Relative: 1.9 % (ref 0.0–3.0)
Eosinophils Absolute: 0.1 10*3/uL (ref 0.0–0.7)
Eosinophils Relative: 0.8 % (ref 0.0–5.0)
HCT: 37.2 % (ref 36.0–46.0)
Hemoglobin: 12.7 g/dL (ref 12.0–15.0)
Lymphocytes Relative: 11.6 % — ABNORMAL LOW (ref 12.0–46.0)
Lymphs Abs: 1.2 10*3/uL (ref 0.7–4.0)
MCHC: 34.2 g/dL (ref 30.0–36.0)
MCV: 106.9 fl — ABNORMAL HIGH (ref 78.0–100.0)
Monocytes Absolute: 1 10*3/uL (ref 0.1–1.0)
Monocytes Relative: 10.1 % (ref 3.0–12.0)
Neutro Abs: 7.8 10*3/uL — ABNORMAL HIGH (ref 1.4–7.7)
Neutrophils Relative %: 75.6 % (ref 43.0–77.0)
Platelets: 287 10*3/uL (ref 150.0–400.0)
RBC: 3.48 Mil/uL — ABNORMAL LOW (ref 3.87–5.11)
RDW: 15 % (ref 11.5–15.5)
WBC: 10.3 10*3/uL (ref 4.0–10.5)

## 2021-09-25 LAB — BASIC METABOLIC PANEL
BUN: 5 mg/dL — ABNORMAL LOW (ref 6–23)
CO2: 24 mEq/L (ref 19–32)
Calcium: 8.8 mg/dL (ref 8.4–10.5)
Chloride: 99 mEq/L (ref 96–112)
Creatinine, Ser: 0.58 mg/dL (ref 0.40–1.20)
GFR: 104.48 mL/min (ref >60.00)
Glucose, Bld: 103 mg/dL — ABNORMAL HIGH (ref 70–99)
Potassium: 3.9 mEq/L (ref 3.5–5.1)
Sodium: 133 mEq/L — ABNORMAL LOW (ref 135–145)

## 2021-09-25 LAB — HEPATIC FUNCTION PANEL
ALT: 38 U/L — ABNORMAL HIGH (ref 0–35)
AST: 168 U/L — ABNORMAL HIGH (ref 0–37)
Albumin: 3.2 g/dL — ABNORMAL LOW (ref 3.5–5.2)
Alkaline Phosphatase: 183 U/L — ABNORMAL HIGH (ref 39–117)
Bilirubin, Direct: 4.9 mg/dL — ABNORMAL HIGH (ref 0.0–0.3)
Total Bilirubin: 8.2 mg/dL — ABNORMAL HIGH (ref 0.2–1.2)
Total Protein: 6.9 g/dL (ref 6.0–8.3)

## 2021-09-25 LAB — AMMONIA: Ammonia: 110 umol/L — ABNORMAL HIGH (ref <=72)

## 2021-09-25 MED ORDER — PREDNISONE 20 MG PO TABS
ORAL_TABLET | ORAL | 0 refills | Status: DC
  Filled 2021-09-25: qty 25, 19d supply, fill #0

## 2021-09-25 MED ORDER — LACTULOSE 10 GM/15ML PO SOLN
30.0000 g | Freq: Two times a day (BID) | ORAL | 0 refills | Status: DC
  Filled 2021-09-25: qty 236, 2d supply, fill #0

## 2021-09-25 NOTE — Addendum Note (Signed)
Addended by: Loura Pardon A on: 09/25/2021 12:42 PM   Modules accepted: Orders

## 2021-09-26 ENCOUNTER — Telehealth: Payer: Self-pay | Admitting: Family Medicine

## 2021-09-26 ENCOUNTER — Encounter: Payer: Self-pay | Admitting: Family Medicine

## 2021-09-26 DIAGNOSIS — K7010 Alcoholic hepatitis without ascites: Secondary | ICD-10-CM

## 2021-09-26 NOTE — Telephone Encounter (Signed)
Referral to GI Managed to get appt at [facility] office on 12/22

## 2021-09-29 ENCOUNTER — Other Ambulatory Visit: Payer: Self-pay

## 2021-09-29 ENCOUNTER — Other Ambulatory Visit (INDEPENDENT_AMBULATORY_CARE_PROVIDER_SITE_OTHER): Payer: 59

## 2021-09-29 DIAGNOSIS — R7401 Elevation of levels of liver transaminase levels: Secondary | ICD-10-CM

## 2021-09-29 DIAGNOSIS — K7010 Alcoholic hepatitis without ascites: Secondary | ICD-10-CM

## 2021-09-29 LAB — CBC WITH DIFFERENTIAL/PLATELET
Basophils Absolute: 0.1 10*3/uL (ref 0.0–0.1)
Basophils Relative: 0.9 % (ref 0.0–3.0)
Eosinophils Absolute: 0.1 10*3/uL (ref 0.0–0.7)
Eosinophils Relative: 0.7 % (ref 0.0–5.0)
HCT: 37.9 % (ref 36.0–46.0)
Hemoglobin: 12.8 g/dL (ref 12.0–15.0)
Lymphocytes Relative: 9.2 % — ABNORMAL LOW (ref 12.0–46.0)
Lymphs Abs: 1.3 10*3/uL (ref 0.7–4.0)
MCHC: 33.8 g/dL (ref 30.0–36.0)
MCV: 106.5 fl — ABNORMAL HIGH (ref 78.0–100.0)
Monocytes Absolute: 0.7 10*3/uL (ref 0.1–1.0)
Monocytes Relative: 4.9 % (ref 3.0–12.0)
Neutro Abs: 12.3 10*3/uL — ABNORMAL HIGH (ref 1.4–7.7)
Neutrophils Relative %: 84.3 % — ABNORMAL HIGH (ref 43.0–77.0)
Platelets: 349 10*3/uL (ref 150.0–400.0)
RBC: 3.56 Mil/uL — ABNORMAL LOW (ref 3.87–5.11)
RDW: 15.2 % (ref 11.5–15.5)
WBC: 14.6 10*3/uL — ABNORMAL HIGH (ref 4.0–10.5)

## 2021-09-29 LAB — BASIC METABOLIC PANEL
BUN: 8 mg/dL (ref 6–23)
CO2: 24 mEq/L (ref 19–32)
Calcium: 8.9 mg/dL (ref 8.4–10.5)
Chloride: 102 mEq/L (ref 96–112)
Creatinine, Ser: 0.54 mg/dL (ref 0.40–1.20)
GFR: 106.28 mL/min (ref >60.00)
Glucose, Bld: 99 mg/dL (ref 70–99)
Potassium: 3.9 mEq/L (ref 3.5–5.1)
Sodium: 135 mEq/L (ref 135–145)

## 2021-09-29 LAB — HEPATIC FUNCTION PANEL
ALT: 68 U/L — ABNORMAL HIGH (ref 0–35)
AST: 213 U/L — ABNORMAL HIGH (ref 0–37)
Albumin: 3.3 g/dL — ABNORMAL LOW (ref 3.5–5.2)
Alkaline Phosphatase: 161 U/L — ABNORMAL HIGH (ref 39–117)
Bilirubin, Direct: 3.5 mg/dL — ABNORMAL HIGH (ref 0.0–0.3)
Total Bilirubin: 6.8 mg/dL — ABNORMAL HIGH (ref 0.2–1.2)
Total Protein: 6.6 g/dL (ref 6.0–8.3)

## 2021-09-29 LAB — AMMONIA: Ammonia: 80 umol/L — ABNORMAL HIGH (ref 11–35)

## 2021-10-02 ENCOUNTER — Ambulatory Visit: Payer: 59 | Admitting: Gastroenterology

## 2021-10-03 ENCOUNTER — Ambulatory Visit (INDEPENDENT_AMBULATORY_CARE_PROVIDER_SITE_OTHER): Payer: 59 | Admitting: Physician Assistant

## 2021-10-03 ENCOUNTER — Encounter: Payer: Self-pay | Admitting: Physician Assistant

## 2021-10-03 VITALS — BP 110/60 | HR 92 | Ht 61.5 in | Wt 176.2 lb

## 2021-10-03 DIAGNOSIS — K219 Gastro-esophageal reflux disease without esophagitis: Secondary | ICD-10-CM | POA: Diagnosis not present

## 2021-10-03 DIAGNOSIS — Z1211 Encounter for screening for malignant neoplasm of colon: Secondary | ICD-10-CM | POA: Diagnosis not present

## 2021-10-03 DIAGNOSIS — K7010 Alcoholic hepatitis without ascites: Secondary | ICD-10-CM

## 2021-10-03 NOTE — Patient Instructions (Signed)
If you are age 51 or younger, your body mass index should be between 19-25. Your Body mass index is 32.76 kg/m. If this is out of the aformentioned range listed, please consider follow up with your Primary Care Provider.   The [facility] providers would like to encourage you to use [facility] to communicate with providers for non-urgent requests or questions.  Due to long hold times on the telephone, sending your provider a message by [facility] may be faster and more efficient way to get a response. Please allow 48 business hours for a response.  Please remember that this is for non-urgent requests/questions.  LABS:  Lab work has been ordered for you to do 1 week from now. Our lab is located in the basement. Press "B" on the elevator. You do not need an appointment for the lab. The lab is located at the first door on the left as you exit the elevator.  HEALTHCARE LAWS AND MY CHART RESULTS: Due to recent changes in healthcare laws, you may see the results of your imaging and laboratory studies on MyChart before your provider has had a chance to review them.   We understand that in some cases there may be results that are confusing or concerning to you. Not all laboratory results come back in the same time frame and the provider may be waiting for multiple results in order to interpret others.  Please give [place] 48 hours in order for your provider to thoroughly review all the results before contacting the office for clarification of your results.   We have scheduled you a follow up with Ellouise Newer, PA on 11/04/21 at 11:00am.  It was great seeing you today! Thank you for entrusting me with your care and choosing [facility].  Ellouise Newer, [place]

## 2021-10-03 NOTE — Progress Notes (Signed)
Chief Complaint: Alcoholic hepatitis  HPI:    Jamie Coleman is a 50 year old female with past medical history of anxiety, reflux, alcohol abuse, chronic transaminitis, obesity and others listed below, who was referred to me by Abner Greenspan, MD for a complaint of alcoholic hepatitis.    09/17/2021-09/18/2021 patient admitted with noticeable jaundice.  At that time he described noticing yellow discoloration of her eyes about 2 days prior.  She noted to be a heavy drinker with liquor 6 shots daily for the past 3 years.  At that time found to have an elevated T bili at 6.4, INR 1.4 and mildly elevated liver enzymes which is started trending down the next day.  At that time he requested to go home with Ativan to be used as needed and promised to stay abstinent from alcohol.  She was discharged home with close follow-up with PCP.  Right upper quadrant ultrasound did show fatty liver.  Discriminant factor 7.5 and did not require steroid.    09/24/2021 patient followed with PCP.  That time discussed that the Ativan made her stumble around.  She wanted some Xanax.  Also describes she took Lactulose and had a lot of diarrhea.  Her jaundice was not any better.  She and her husband both stop drinking.  Continued on Thiamine and multivitamin.    09/29/2021 CBC with a white count of 14.6 (patient on prednisone), hepatic function panel on 12/14 with a total bili of 8.2, direct bili 4.9, alk phos 183, AST 168, ALT 38, albumin 3.2, repeat on 12/19 with a total bili 6.8, direct bili 3.5, alk phos 161, AST 213, ALT 68 and albumin 3.3, ammonia remained elevated at 80 (110 on 12/14), PT/INR INR 1.7, PT 18.1.  Most recent discriminant function 29.8 (previously 31.2)    Today, the patient presents to clinic accompanied by her husband.  She tells me that both she and him had been drinking daily since February of this year and then she noticed that she was getting yellow.  She had work-up as above in the ER/hospital.  She has  since been started on Prednisone taking 40 mg for 7 days, then 20 mg for 7 days then 10 mg for 5 days by her PCP.  Overall she is feeling better even over the past week and she is starting to become less and less yellow.  Denies any abdominal pain at any point.  Does tell me she was taking Lactulose for an elevated ammonia level but stopped this when it caused profuse watery diarrhea.  She is now having 2-3 bowel movements a day without this medicine.    Does have chronic reflux symptoms for which she takes Omeprazole 20 mg daily.    No previous EGD or colonoscopy.    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    Denies fever, chills, weight loss, blood in her stool, nausea, vomiting or symptoms that awaken her from sleep.  Past Medical History:  Diagnosis Date   Allergy    allergic rhinitis   Anxiety    situational   COVID 05/2021   Difficult intubation    states 2012 ablation surgery, had diff intubation,glide scope used, not noted in EPIC anesthesia record   Elevated hemoglobin A1c 08/08/2015   Elevated liver function tests 08/08/2015   GERD (gastroesophageal reflux disease)    Hyperlipidemia    Low serum vitamin D 08/08/2015   Status post endometrial ablation    Tennis elbow    currently in  PT-10/16    Past Surgical History:  Procedure Laterality Date   CESAREAN SECTION     ENDOMETRIAL ABLATION     8/12   EXTREMITY WIRE/PIN REMOVAL Left 07/16/2016   Procedure: PIN REMOVAL OF LEFT WRIST X 2;  Surgeon: Roseanne Kaufman, MD;  Location: [facility];  Service: Orthopedics;  Laterality: Left;   FOOT SURGERY     LAPAROSCOPIC TUBAL LIGATION  06/09/2011   Procedure: LAPAROSCOPIC TUBAL LIGATION;  Surgeon: Arloa Koh;  Location: [facility];  Service: Gynecology;  Laterality: N/A;   LIGAMENT REPAIR Left 05/08/2016   Procedure: LEFT WRIST SCAPHOLUNATE LIGAMENT RECONSTRUCTION WITH TENDON GRAFT PIN NEURECTOMY AND REPAIR;  Surgeon: Roseanne Kaufman, MD;  Location: [facility];   Service: Orthopedics;  Laterality: Left;   TONSILLECTOMY     as child   TUBAL LIGATION  8/12    Current Outpatient Medications  Medication Sig Dispense Refill   ALPRAZolam (XANAX) 0.5 MG tablet Take 1 tablet (0.5 mg total) by mouth at bedtime as needed for anxiety. 30 tablet 1   Ascorbic Acid (VITAMIN C) 1000 MG tablet Take 1,000 mg by mouth daily.     Calcium Carbonate-Vit D-Min 600-400 MG-UNIT TABS Take 1 tablet by mouth daily.     Cholecalciferol (VITAMIN D3) 5000 units TABS Take 1 tablet by mouth every other day.     escitalopram (LEXAPRO) 10 MG tablet Take 1 tablet (10 mg total) by mouth daily. 30 tablet 3   fexofenadine (ALLEGRA) 180 MG tablet Take 180 mg by mouth daily as needed.     fluorometholone (FML LIQUIFILM) 0.1 % ophthalmic suspension Place 1 drop into both eyes once a day as needed 5 mL 1   ibuprofen (ADVIL,MOTRIN) 800 MG tablet Take 1 tablet (800 mg total) by mouth every 8 (eight) hours as needed (with food). 90 tablet 2   lactulose (CHRONULAC) 10 GM/15ML solution Take 45 mLs (30 g total) by mouth 2 (two) times daily. 236 mL 0   Multiple Vitamin (MULTIVITAMIN) capsule Take 1 capsule by mouth daily.       Omega-3 Fatty Acids (FISH OIL PO) Take by mouth daily.       omeprazole (PRILOSEC) 20 MG capsule Take 20 mg by mouth daily.     predniSONE (DELTASONE) 20 MG tablet Take 2 tablets by mouth daily for 7 days then 1 tablet daily for seven days then 1/2 tablet for 5 days 25 tablet 0   thiamine 100 MG tablet Take 1 tablet (100 mg total) by mouth daily. 90 tablet 1   No current facility-administered medications for this visit.    Allergies as of 10/03/2021 - Review Complete 09/24/2021  Allergen Reaction Noted   Lipitor [atorvastatin]  08/07/2013    Family History  Problem Relation Age of Onset   Cancer Mother        CA insitu of appendix   Heart disease Father        CAD   Diabetes Father        type II   Alzheimer's disease Father    Diabetes Brother        type II    Diabetes Maternal Grandfather    Diabetes Paternal Grandmother    Diabetes Paternal Grandfather    Breast cancer Neg Hx     Social History   Socioeconomic History   Marital status: Married    Spouse name: Not on file   Number of children: Not on file   Years of education:  Not on file   Highest education level: Not on file  Occupational History   Not on file  Tobacco Use   Smoking status: Former    Types: Cigarettes    Quit date: 07/25/1983    Years since quitting: 38.2   Smokeless tobacco: Never   Tobacco comments:    in high school  Vaping Use   Vaping Use: Never used  Substance and Sexual Activity   Alcohol use: Yes    Alcohol/week: 14.0 standard drinks    Types: 14 Glasses of wine per week    Comment: 6 shots daily 12 shots daily on weekends   Drug use: No   Sexual activity: Yes    Partners: Male    Birth control/protection: Surgical    Comment: Tubal ligation  Other Topics Concern   Not on file  Social History Narrative   Not on file   Social Determinants of Health   Financial Resource Strain: Not on file  Food Insecurity: Not on file  Transportation Needs: Not on file  Physical Activity: Not on file  Stress: Not on file  Social Connections: Not on file  Intimate Partner Violence: Not on file    Review of Systems:    Constitutional: No weight loss, fever or chills Skin: +jaundice Cardiovascular: No chest pain Respiratory: No SOB  Gastrointestinal: See HPI and otherwise negative Genitourinary: No dysuria  Neurological: No headache, dizziness or syncope Musculoskeletal: No new muscle or joint pain Hematologic: No bleeding Psychiatric: No history of depression or anxiety   Physical Exam:  Vital signs: BP 110/60 (BP Location: Left Arm, Patient Position: Sitting, Cuff Size: Normal)    Pulse 92    Ht 5' 1.5" (1.562 m) Comment: height measured without shoes   Wt 176 lb 4 oz (79.9 kg)    BMI 32.76 kg/m    Constitutional:   Pleasant jaundiced  Caucasian female appears to be in NAD, Well developed, Well nourished, alert and cooperative Head:  Normocephalic and atraumatic. Eyes:   PEERL, EOMI. No icterus. Conjunctiva pink. Ears:  Normal auditory acuity. Neck:  Supple Throat: Oral cavity and pharynx without inflammation, swelling or lesion.  Respiratory: Respirations even and unlabored. Lungs clear to auscultation bilaterally.   No wheezes, crackles, or rhonchi.  Cardiovascular: Normal S1, S2. No MRG. Regular rate and rhythm. No peripheral edema, cyanosis or pallor.  Gastrointestinal:  Soft, nondistended, mild epigastric ttp, No rebound or guarding. Normal bowel sounds. No appreciable masses or hepatomegaly. Rectal:  Not performed.  Msk:  Symmetrical without gross deformities. Without edema, no deformity or joint abnormality.  Neurologic:  Alert and  oriented x4;  grossly normal neurologically.  Skin:   Dry and intact without significant lesions or rashes. Psychiatric:  Demonstrates good judgement and reason without abnormal affect or behaviors.  RELEVANT LABS AND IMAGING: CBC    Component Value Date/Time   WBC 14.6 (H) 09/29/2021 0910   RBC 3.56 (L) 09/29/2021 0910   HGB 12.8 09/29/2021 0910   HCT 37.9 09/29/2021 0910   PLT 349.0 09/29/2021 0910   MCV 106.5 (H) 09/29/2021 0910   MCH 36.2 (H) 09/17/2021 0924   MCHC 33.8 09/29/2021 0910   RDW 15.2 09/29/2021 0910   LYMPHSABS 1.3 09/29/2021 0910   MONOABS 0.7 09/29/2021 0910   EOSABS 0.1 09/29/2021 0910   BASOSABS 0.1 09/29/2021 0910    CMP     Component Value Date/Time   NA 135 09/29/2021 0910   K 3.9 09/29/2021 0910   CL 102 09/29/2021  0910   CO2 24 09/29/2021 0910   GLUCOSE 99 09/29/2021 0910   BUN 8 09/29/2021 0910   CREATININE 0.54 09/29/2021 0910   CREATININE 0.76 08/14/2015 0840   CALCIUM 8.9 09/29/2021 0910   PROT 6.6 09/29/2021 0910   ALBUMIN 3.3 (L) 09/29/2021 0910   AST 213 (H) 09/29/2021 0910   ALT 68 (H) 09/29/2021 0910   ALKPHOS 161 (H) 09/29/2021  0910   BILITOT 6.8 (H) 09/29/2021 0910   GFRNONAA >60 09/18/2021 0525   GFRAA >60 07/15/2020 1550    Assessment: 1.  Alcoholic hepatitis: Patient was drinking up to 6 shots of liquor daily since February, over the past 20 days is completely stopped drinking alcohol, labs are slowly improving, PCP started her on Prednisone (though discriminant function was never above 32), she is currently on a taper of this medicine, she is feeling much better and her jaundice is decreasing 2.  Chronic GERD: Maintained on Omeprazole 20 mg daily, no previous EGD; likely gastritis question if some of this is related to alcohol 3.  Screening for colorectal cancer: No prior colonoscopy  Plan: 1.  Discussed with patient that at this point there is nothing else to be done, would like to continue to monitor her labs slightly more frequently here in the beginning to make sure things continue to look well.  We will repeat CBC, CMP, ammonia and PT/INR next Wednesday. 2.  Patient will eventually need EGD for chronic reflux symptoms and colonoscopy as she is 50 years old.  We will wait until her acute hepatitis has resolved to discuss this further. 3.  Will let Dr. Rush Landmark review patient's case to make sure he does not have any further recommendations.  This is who she would like to establish care with. 4.  Continue to abstain from alcohol. 5.  Patient should continue on Prednisone taper for now, though I am not sure there is much benefit from this given that her discriminant function was never above 32 per my calculations. 6.  Patient to follow in clinic with me in 4 to 6 weeks or sooner if necessary.  Ellouise Newer, PA-C [facility] 10/03/2021, 11:33 AM  Cc: Tower, Wynelle Fanny, MD

## 2021-10-03 NOTE — Addendum Note (Signed)
Addended by: Kandy Garrison on: 10/03/2021 12:13 PM   Modules accepted: Orders

## 2021-10-06 NOTE — Progress Notes (Signed)
Attending Physician's Attestation   I have reviewed the chart.   I agree with the Advanced Practitioner's note, impression, and recommendations with any updates as below. Please also the following labs that she will be coming in later this week to rule out other issues as well: Hepatitis A total antibody, hepatitis B surface antibody, hepatitis B core antibody total, ANA, immunoglobulins, anti-smooth muscle antibody, antimitochondrial antibody.   Justice Britain, MD [facility]oscopy Office # [phone]

## 2021-10-07 ENCOUNTER — Telehealth: Payer: Self-pay

## 2021-10-07 DIAGNOSIS — K7010 Alcoholic hepatitis without ascites: Secondary | ICD-10-CM

## 2021-10-07 NOTE — Telephone Encounter (Signed)
Labs have been entered- the pt will be in later this week.      Author: Mansouraty, Telford Nab., MD Service: Gastroenterology Author Type: Physician  Filed: 10/06/2021  5:44 AM Encounter Date: 10/03/2021 Status: Signed  Editor: Mansouraty, Telford Nab., MD (Physician)                     Attending 22 Attestation    I have reviewed the chart.    I agree with the Advanced Practitioner's note, impression, and recommendations with any updates as below. Please also the following labs that she will be coming in later this week to rule out other issues as well: Hepatitis A total antibody, hepatitis B surface antibody, hepatitis B core antibody total, ANA, immunoglobulins, anti-smooth muscle antibody, antimitochondrial antibody.     Justice Britain, MD [facility] Advanced [facility]oscopy

## 2021-10-07 NOTE — Telephone Encounter (Signed)
-----   Message from Irving Copas., MD sent at 10/06/2021  5:42 AM EST -----    ----- Message ----- From: Levin Erp, PA Sent: 10/03/2021  12:13 PM EST To: Irving Copas., MD

## 2021-10-10 ENCOUNTER — Other Ambulatory Visit (INDEPENDENT_AMBULATORY_CARE_PROVIDER_SITE_OTHER): Payer: 59

## 2021-10-10 DIAGNOSIS — K7010 Alcoholic hepatitis without ascites: Secondary | ICD-10-CM

## 2021-10-10 LAB — CBC WITH DIFFERENTIAL/PLATELET
Basophils Absolute: 0.1 10*3/uL (ref 0.0–0.1)
Basophils Relative: 1.1 % (ref 0.0–3.0)
Eosinophils Absolute: 0.2 10*3/uL (ref 0.0–0.7)
Eosinophils Relative: 1.5 % (ref 0.0–5.0)
HCT: 39.8 % (ref 36.0–46.0)
Hemoglobin: 13.3 g/dL (ref 12.0–15.0)
Lymphocytes Relative: 18.9 % (ref 12.0–46.0)
Lymphs Abs: 2.4 10*3/uL (ref 0.7–4.0)
MCHC: 33.4 g/dL (ref 30.0–36.0)
MCV: 105.6 fl — ABNORMAL HIGH (ref 78.0–100.0)
Monocytes Absolute: 0.8 10*3/uL (ref 0.1–1.0)
Monocytes Relative: 6.7 % (ref 3.0–12.0)
Neutro Abs: 9 10*3/uL — ABNORMAL HIGH (ref 1.4–7.7)
Neutrophils Relative %: 71.8 % (ref 43.0–77.0)
Platelets: 226 10*3/uL (ref 150.0–400.0)
RBC: 3.77 Mil/uL — ABNORMAL LOW (ref 3.87–5.11)
RDW: 14.3 % (ref 11.5–15.5)
WBC: 12.5 10*3/uL — ABNORMAL HIGH (ref 4.0–10.5)

## 2021-10-10 LAB — COMPREHENSIVE METABOLIC PANEL
ALT: 67 U/L — ABNORMAL HIGH (ref 0–35)
AST: 89 U/L — ABNORMAL HIGH (ref 0–37)
Albumin: 3.4 g/dL — ABNORMAL LOW (ref 3.5–5.2)
Alkaline Phosphatase: 129 U/L — ABNORMAL HIGH (ref 39–117)
BUN: 10 mg/dL (ref 6–23)
CO2: 24 mEq/L (ref 19–32)
Calcium: 9.2 mg/dL (ref 8.4–10.5)
Chloride: 103 mEq/L (ref 96–112)
Creatinine, Ser: 0.54 mg/dL (ref 0.40–1.20)
GFR: 106.26 mL/min (ref >60.00)
Glucose, Bld: 90 mg/dL (ref 70–99)
Potassium: 3.7 mEq/L (ref 3.5–5.1)
Sodium: 138 mEq/L (ref 135–145)
Total Bilirubin: 3.5 mg/dL — ABNORMAL HIGH (ref 0.2–1.2)
Total Protein: 6.7 g/dL (ref 6.0–8.3)

## 2021-10-10 LAB — PROTIME-INR
INR: 1.1
Prothrombin Time: 11.6 s — ABNORMAL HIGH (ref 9.0–11.5)

## 2021-10-10 LAB — AMMONIA: Ammonia: 73 umol/L — ABNORMAL HIGH (ref 11–35)

## 2021-10-14 ENCOUNTER — Other Ambulatory Visit: Payer: Self-pay

## 2021-10-14 ENCOUNTER — Other Ambulatory Visit (HOSPITAL_COMMUNITY): Payer: Self-pay

## 2021-10-14 DIAGNOSIS — K7010 Alcoholic hepatitis without ascites: Secondary | ICD-10-CM

## 2021-10-14 LAB — HEPATITIS B SURFACE ANTIBODY,QUALITATIVE: Hep B S Ab: REACTIVE — AB

## 2021-10-14 LAB — HEPATITIS B CORE ANTIBODY, TOTAL: Hep B Core Total Ab: NONREACTIVE

## 2021-10-14 LAB — MITOCHONDRIAL ANTIBODIES: Mitochondrial M2 Ab, IgG: <=20 U (ref <=20.0)

## 2021-10-14 LAB — IMMUNOGLOBULINS A/E/G/M, SERUM
IgA/Immunoglobulin A, Serum: 324 mg/dL (ref 87–352)
IgE (Immunoglobulin E), Serum: 70 IU/mL (ref 6–495)
IgG (Immunoglobin G), Serum: 1042 mg/dL (ref 586–1602)
IgM (Immunoglobulin M), Srm: 107 mg/dL (ref 26–217)

## 2021-10-14 LAB — HEPATITIS A ANTIBODY, TOTAL: Hepatitis A AB,Total: NONREACTIVE

## 2021-10-14 LAB — ANA: Anti Nuclear Antibody (ANA): NEGATIVE

## 2021-10-14 LAB — ANTI-SMOOTH MUSCLE ANTIBODY, IGG: Actin (Smooth Muscle) Antibody (IGG): <20 U (ref <20)

## 2021-10-14 MED ORDER — AMOXICILLIN 500 MG PO CAPS
ORAL_CAPSULE | ORAL | 0 refills | Status: DC
  Filled 2021-10-14: qty 30, 10d supply, fill #0

## 2021-10-16 ENCOUNTER — Other Ambulatory Visit (HOSPITAL_COMMUNITY): Payer: Self-pay

## 2021-10-16 MED ORDER — METHYLPREDNISOLONE 4 MG PO TBPK
ORAL_TABLET | ORAL | 0 refills | Status: DC
  Filled 2021-10-16: qty 21, 6d supply, fill #0

## 2021-11-04 ENCOUNTER — Encounter: Payer: Self-pay | Admitting: Physician Assistant

## 2021-11-04 ENCOUNTER — Other Ambulatory Visit: Payer: Self-pay

## 2021-11-04 ENCOUNTER — Other Ambulatory Visit (HOSPITAL_COMMUNITY): Payer: Self-pay

## 2021-11-04 ENCOUNTER — Other Ambulatory Visit (INDEPENDENT_AMBULATORY_CARE_PROVIDER_SITE_OTHER): Payer: 59

## 2021-11-04 ENCOUNTER — Ambulatory Visit (INDEPENDENT_AMBULATORY_CARE_PROVIDER_SITE_OTHER): Payer: 59 | Admitting: Physician Assistant

## 2021-11-04 VITALS — BP 104/60 | HR 92 | Ht 61.5 in | Wt 173.0 lb

## 2021-11-04 DIAGNOSIS — K7010 Alcoholic hepatitis without ascites: Secondary | ICD-10-CM

## 2021-11-04 DIAGNOSIS — K219 Gastro-esophageal reflux disease without esophagitis: Secondary | ICD-10-CM | POA: Diagnosis not present

## 2021-11-04 DIAGNOSIS — E559 Vitamin D deficiency, unspecified: Secondary | ICD-10-CM

## 2021-11-04 DIAGNOSIS — Z1211 Encounter for screening for malignant neoplasm of colon: Secondary | ICD-10-CM

## 2021-11-04 DIAGNOSIS — R5382 Chronic fatigue, unspecified: Secondary | ICD-10-CM

## 2021-11-04 LAB — COMPREHENSIVE METABOLIC PANEL
ALT: 17 U/L (ref 0–35)
AST: 32 U/L (ref 0–37)
Albumin: 3.9 g/dL (ref 3.5–5.2)
Alkaline Phosphatase: 96 U/L (ref 39–117)
BUN: 9 mg/dL (ref 6–23)
CO2: 26 mEq/L (ref 19–32)
Calcium: 9.1 mg/dL (ref 8.4–10.5)
Chloride: 104 mEq/L (ref 96–112)
Creatinine, Ser: 0.62 mg/dL (ref 0.40–1.20)
GFR: 102.73 mL/min (ref >60.00)
Glucose, Bld: 104 mg/dL — ABNORMAL HIGH (ref 70–99)
Potassium: 3.8 mEq/L (ref 3.5–5.1)
Sodium: 138 mEq/L (ref 135–145)
Total Bilirubin: 1.2 mg/dL (ref 0.2–1.2)
Total Protein: 6.9 g/dL (ref 6.0–8.3)

## 2021-11-04 LAB — VITAMIN B12: Vitamin B-12: 513 pg/mL (ref 211–911)

## 2021-11-04 LAB — CBC WITH DIFFERENTIAL/PLATELET
Basophils Absolute: 0.1 10*3/uL (ref 0.0–0.1)
Basophils Relative: 1.2 % (ref 0.0–3.0)
Eosinophils Absolute: 0.1 10*3/uL (ref 0.0–0.7)
Eosinophils Relative: 2.6 % (ref 0.0–5.0)
HCT: 37.3 % (ref 36.0–46.0)
Hemoglobin: 12.8 g/dL (ref 12.0–15.0)
Lymphocytes Relative: 25.5 % (ref 12.0–46.0)
Lymphs Abs: 1.3 10*3/uL (ref 0.7–4.0)
MCHC: 34.2 g/dL (ref 30.0–36.0)
MCV: 101.4 fl — ABNORMAL HIGH (ref 78.0–100.0)
Monocytes Absolute: 0.6 10*3/uL (ref 0.1–1.0)
Monocytes Relative: 11 % (ref 3.0–12.0)
Neutro Abs: 3 10*3/uL (ref 1.4–7.7)
Neutrophils Relative %: 59.7 % (ref 43.0–77.0)
Platelets: 183 10*3/uL (ref 150.0–400.0)
RBC: 3.68 Mil/uL — ABNORMAL LOW (ref 3.87–5.11)
RDW: 12.5 % (ref 11.5–15.5)
WBC: 5.1 10*3/uL (ref 4.0–10.5)

## 2021-11-04 LAB — PROTIME-INR
INR: 1.2 ratio — ABNORMAL HIGH (ref 0.8–1.0)
Prothrombin Time: 12.9 s (ref 9.6–13.1)

## 2021-11-04 LAB — FOLATE: Folate: >24.2 ng/mL (ref >5.9)

## 2021-11-04 MED ORDER — HYDROCORTISONE (PERIANAL) 2.5 % EX CREA
1.0000 "application " | TOPICAL_CREAM | Freq: Two times a day (BID) | CUTANEOUS | 1 refills | Status: DC
  Filled 2021-11-04: qty 30, 10d supply, fill #0

## 2021-11-04 MED ORDER — NA SULFATE-K SULFATE-MG SULF 17.5-3.13-1.6 GM/177ML PO SOLN
1.0000 | Freq: Once | ORAL | 0 refills | Status: AC
  Filled 2021-11-04: qty 354, 1d supply, fill #0

## 2021-11-04 NOTE — Patient Instructions (Addendum)
Your provider has requested that you go to the basement level for lab work before leaving today. Press "B" on the elevator. The lab is located at the first door on the left as you exit the elevator.  We have sent the following medications to your pharmacy for you to pick up at your convenience: Hydrocortisone 2.5% twice daily to hemorrhoids.  You have been scheduled for an endoscopy and colonoscopy. Please follow the written instructions given to you at your visit today. Please pick up your prep supplies at the pharmacy within the next 1-3 days. If you use inhalers (even only as needed), please bring them with you on the day of your procedure.  If you are age 65 or older, your body mass index should be between 23-30. Your Body mass index is 32.16 kg/m. If this is out of the aforementioned range listed, please consider follow up with your Primary Care Provider.  If you are age 76 or younger, your body mass index should be between 19-25. Your Body mass index is 32.16 kg/m. If this is out of the aformentioned range listed, please consider follow up with your Primary Care Provider.   ________________________________________________________  The [facility] providers would like to encourage you to use [facility] to communicate with providers for non-urgent requests or questions.  Due to long hold times on the telephone, sending your provider a message by [facility] may be a faster and more efficient way to get a response.  Please allow 48 business hours for a response.  Please remember that this is for non-urgent requests.  _______________________________________________________

## 2021-11-04 NOTE — Progress Notes (Signed)
Chief Complaint: Follow-up alcoholic hepatitis  HPI:    Jamie Coleman is a 51 year old female with a past medical history as listed below including reflux and alcoholic hepatitis, assigned to Dr. Rush Landmark, who returns to clinic today for follow-up of her alcoholic hepatitis.    09/17/2021-09/18/2021 patient admitted with noticeable jaundice.  At that time he described noticing yellow discoloration of her eyes about 2 days prior.  She noted to be a heavy drinker with liquor 6 shots daily for the past 3 years.  At that time found to have an elevated T bili at 6.4, INR 1.4 and mildly elevated liver enzymes which is started trending down the next day.  At that time he requested to go home with Ativan to be used as needed and promised to stay abstinent from alcohol.  She was discharged home with close follow-up with PCP.  Right upper quadrant ultrasound did show fatty liver.  Discriminant factor 7.5 and did not require steroid.    09/24/2021 patient followed with PCP.  That time discussed that the Ativan made her stumble around.  She wanted some Xanax.  Also describes she took Lactulose and had a lot of diarrhea.  Her jaundice was not any better.  She and her husband both stop drinking.  Continued on Thiamine and multivitamin.    09/29/2021 CBC with a white count of 14.6 (patient on prednisone), hepatic function panel on 12/14 with a total bili of 8.2, direct bili 4.9, alk phos 183, AST 168, ALT 38, albumin 3.2, repeat on 12/19 with a total bili 6.8, direct bili 3.5, alk phos 161, AST 213, ALT 68 and albumin 3.3, ammonia remained elevated at 80 (110 on 12/14), PT/INR INR 1.7, PT 18.1.  Most recent discriminant function 29.8 (previously 31.2)    10/03/2021 patient seen in clinic with her husband and described that they had had an increase in alcoholic intake since February of last year.  She was feeling better after stopping her alcohol intake and using Prednisone prescribed by her PCP.  At that time discussed  that we would like to continue to monitor her labs but otherwise there was not much to do.  She had a repeat CBC, CMP, ammonia and PT/INR scheduled.  She also had further work-up for other etiologies including hepatitis ABC and autoimmune disorders.  Did discuss that she would need eventual EGD for chronic reflux and a colonoscopy.  Recommended that she continue on her Prednisone taper.    10/10/2021 other liver serologies including ANA, ASMA, hepatitis a and B, AMA and immunoglobulins were all normal.  Hepatitis B surface antibody was positive showing that she had been previously vaccinated.    10/10/2021 CBC with a white count of 12.5 (on prednisone), other labs indicated a minimally elevated MCV at 105.6 and otherwise normal.  CMP with bilirubin at 3.5 (6.8 on 12/18, AST down to 89, ALT 67.  PT/INR with an iron are now normal at 1.1 and a PT decreased to 11.6.  That time things were continue to look better and she was told to repeat a CMP, INR and CBC in 2 weeks 3 weeks.    Today, the patient presents clinic and tells me that she feels well overall.  She has completely abstained from alcohol over the past 3 months.  She finished her Prednisone January 5 or 6.  Her biggest complaint today is that she has external hemorrhoids which are painful.  Apparently has been giving her trouble ever since she took Lactulose initially  when seen in the ER.  Her stools have returned to normal but occasionally these tend to flare.  She believes they are external hemorrhoids.  She has been doing sitz bath's and lidocaine spray.  Denies any bleeding.    Continues with chronic reflux controlled on Omeprazole 20 mg daily.    Denies fever, chills, weight loss, blood in her stool, abdominal pain, nausea, vomiting or symptoms that awaken her from sleep.  Past Medical History:  Diagnosis Date   Allergy    allergic rhinitis   Anxiety    situational   COVID 05/2021   Difficult intubation    states 2012 ablation surgery, had  diff intubation,glide scope used, not noted in EPIC anesthesia record   Elevated hemoglobin A1c 08/08/2015   Elevated liver function tests 08/08/2015   GERD (gastroesophageal reflux disease)    Hyperlipidemia    Low serum vitamin D 08/08/2015   Status post endometrial ablation    Tennis elbow    currently in PT-10/16    Past Surgical History:  Procedure Laterality Date   CESAREAN SECTION     ENDOMETRIAL ABLATION     8/12   EXTREMITY WIRE/PIN REMOVAL Left 07/16/2016   Procedure: PIN REMOVAL OF LEFT WRIST X 2;  Surgeon: Roseanne Kaufman, MD;  Location: [facility];  Service: Orthopedics;  Laterality: Left;   FOOT SURGERY Left    LAPAROSCOPIC TUBAL LIGATION  06/09/2011   Procedure: LAPAROSCOPIC TUBAL LIGATION;  Surgeon: Arloa Koh;  Location: [facility];  Service: Gynecology;  Laterality: N/A;   LIGAMENT REPAIR Left 05/08/2016   Procedure: LEFT WRIST SCAPHOLUNATE LIGAMENT RECONSTRUCTION WITH TENDON GRAFT PIN NEURECTOMY AND REPAIR;  Surgeon: Roseanne Kaufman, MD;  Location: [facility] [place];  Service: Orthopedics;  Laterality: Left;   TONSILLECTOMY     as child    Current Outpatient Medications  Medication Sig Dispense Refill   ALPRAZolam (XANAX) 0.5 MG tablet Take 1 tablet (0.5 mg total) by mouth at bedtime as needed for anxiety. 30 tablet 1   amoxicillin (AMOXIL) 500 MG capsule Take 1 capsule by mouth 3 times a day until gone 30 capsule 0   Ascorbic Acid (VITAMIN C) 1000 MG tablet Take 1,000 mg by mouth daily.     Calcium Carbonate-Vit D-Min 600-400 MG-UNIT TABS Take 1 tablet by mouth daily.     Cholecalciferol (VITAMIN D3) 5000 units TABS Take 1 tablet by mouth every other day.     escitalopram (LEXAPRO) 10 MG tablet Take 1 tablet (10 mg total) by mouth daily. (Patient not taking: Reported on 10/03/2021) 30 tablet 3   fexofenadine (ALLEGRA) 180 MG tablet Take 180 mg by mouth daily as needed.     fluorometholone (FML LIQUIFILM) 0.1 % ophthalmic suspension Place 1  drop into both eyes once a day as needed 5 mL 1   ibuprofen (ADVIL,MOTRIN) 800 MG tablet Take 1 tablet (800 mg total) by mouth every 8 (eight) hours as needed (with food). 90 tablet 2   methylPREDNISolone (MEDROL DOSEPAK) 4 MG TBPK tablet Take as directed 21 tablet 0   Multiple Vitamin (MULTIVITAMIN) capsule Take 1 capsule by mouth daily.       Omega-3 Fatty Acids (FISH OIL PO) Take by mouth daily.       omeprazole (PRILOSEC) 20 MG capsule Take 20 mg by mouth daily.     predniSONE (DELTASONE) 20 MG tablet Take 2 tablets by mouth daily for 7 days then 1 tablet daily for seven days then 1/2 tablet for  5 days 25 tablet 0   thiamine 100 MG tablet Take 1 tablet (100 mg total) by mouth daily. 90 tablet 1   No current facility-administered medications for this visit.    Allergies as of 11/04/2021 - Review Complete 10/03/2021  Allergen Reaction Noted   Lipitor [atorvastatin]  08/07/2013    Family History  Problem Relation Age of Onset   Cancer Mother        CA insitu of appendix   Heart disease Father        CAD   Diabetes Father        type II   Alzheimer's disease Father    Colon polyps Father    Diabetes Brother        type II   Diabetes Maternal Grandfather    Diabetes Paternal Grandmother    Diabetes Paternal Grandfather    Breast cancer Neg Hx     Social History   Socioeconomic History   Marital status: Married    Spouse name: Not on file   Number of children: 2   Years of education: Not on file   Highest education level: Not on file  Occupational History   Occupation: Nurse  Tobacco Use   Smoking status: Former    Types: Cigarettes    Quit date: 07/25/1983    Years since quitting: 38.3   Smokeless tobacco: Never   Tobacco comments:    in high school  Vaping Use   Vaping Use: Never used  Substance and Sexual Activity   Alcohol use: Yes    Alcohol/week: 14.0 standard drinks    Types: 14 Glasses of wine per week    Comment: 6 shots daily 12 shots daily on  weekends   Drug use: No   Sexual activity: Yes    Partners: Male    Birth control/protection: Surgical    Comment: Tubal ligation  Other Topics Concern   Not on file  Social History Narrative   Not on file   Social Determinants of Health   Financial Resource Strain: Not on file  Food Insecurity: Not on file  Transportation Needs: Not on file  Physical Activity: Not on file  Stress: Not on file  Social Connections: Not on file  Intimate Partner Violence: Not on file    Review of Systems:    Constitutional: No weight loss, fever or chills Cardiovascular: No chest pain  Respiratory: No SOB  Gastrointestinal: See HPI and otherwise negative   Physical Exam:  Vital signs: BP 104/60    Pulse 92    Ht 5' 1.5" (1.562 m)    Wt 173 lb (78.5 kg)    SpO2 98%    BMI 32.16 kg/m    Constitutional:   Pleasant overweight Caucasian female appears to be in NAD, Well developed, Well nourished, alert and cooperative Respiratory: Respirations even and unlabored. Lungs clear to auscultation bilaterally.   No wheezes, crackles, or rhonchi.  Cardiovascular: Normal S1, S2. No MRG. Regular rate and rhythm. No peripheral edema, cyanosis or pallor.  Gastrointestinal:  Soft, nondistended, mild epigastric ttp. No rebound or guarding. Normal bowel sounds. No appreciable masses or hepatomegaly. Rectal:  Declined Psychiatric: Demonstrates good judgement and reason without abnormal affect or behaviors.  RELEVANT LABS AND IMAGING: CBC    Component Value Date/Time   WBC 12.5 (H) 10/10/2021 1050   RBC 3.77 (L) 10/10/2021 1050   HGB 13.3 10/10/2021 1050   HCT 39.8 10/10/2021 1050   PLT 226.0 10/10/2021 1050   MCV  105.6 (H) 10/10/2021 1050   MCH 36.2 (H) 09/17/2021 0924   MCHC 33.4 10/10/2021 1050   RDW 14.3 10/10/2021 1050   LYMPHSABS 2.4 10/10/2021 1050   MONOABS 0.8 10/10/2021 1050   EOSABS 0.2 10/10/2021 1050   BASOSABS 0.1 10/10/2021 1050    CMP     Component Value Date/Time   NA 138  10/10/2021 1050   K 3.7 10/10/2021 1050   CL 103 10/10/2021 1050   CO2 24 10/10/2021 1050   GLUCOSE 90 10/10/2021 1050   BUN 10 10/10/2021 1050   CREATININE 0.54 10/10/2021 1050   CREATININE 0.76 08/14/2015 0840   CALCIUM 9.2 10/10/2021 1050   PROT 6.7 10/10/2021 1050   ALBUMIN 3.4 (L) 10/10/2021 1050   AST 89 (H) 10/10/2021 1050   ALT 67 (H) 10/10/2021 1050   ALKPHOS 129 (H) 10/10/2021 1050   BILITOT 3.5 (H) 10/10/2021 1050   GFRNONAA >60 09/18/2021 0525   GFRAA >60 07/15/2020 1550    Assessment: 1.  Alcoholic hepatitis: Almost complete resolution of symptoms and labs are returning to normal, patient has been abstaining from alcohol 2.  Chronic GERD: For many years, no prior EGD, typically controlled on Omeprazole 20 mg daily with occasional flares requiring more medicine; likely chronic gastritis 3.  Screening for colorectal cancer: Patient is 19 and never had screening for colorectal cancer  Plan: 1.  Repeat labs today including CMP, CBC and PT/INR 2.  Prescribed Hydrocortisone ointment to be applied twice daily to external hemorrhoids. 3.  Scheduled patient for a diagnostic EGD and screening colonoscopy in the [facility] with Dr. Rush Landmark.  Did provide the patient a detailed list of risks for the procedures and she agrees to proceed.  We did have some debate over patient's history of difficult intubation.  Apparently this happened on one occasion and she has had multiple procedures since.  She would prefer to be done here as an outpatient if possible.  I explained that due to this history I will have Jamie Coleman call her and discuss whether or not these procedures need to be at the hospital.  For now we will schedule her in the [facility] in March which will give [place] time to decipher. 4.  Continue Omeprazole 20 mg daily 5.  Patient to follow in clinic per recommendations from Dr. Rush Landmark after time of procedures.  Jamie Newer, PA-C [facility] 11/04/2021, 11:03 AM  Cc:  Tower, Wynelle Fanny, MD

## 2021-11-04 NOTE — Progress Notes (Signed)
Attending Physician's Attestation   I have reviewed the chart.   I agree with the Advanced Practitioner's note, impression, and recommendations with any updates as below. Agree with endoscopic evaluation for Barrett's screening and for her breakthrough symptoms.  Continue alcohol abstinence.  Colonoscopy for screening.  Appreciate CRNA Nulty to evaluate and see if any issues preclude [facility] procedures.   Justice Britain, MD [facility] Advanced Endoscopy Office # [phone]

## 2021-11-11 ENCOUNTER — Other Ambulatory Visit: Payer: Self-pay | Admitting: *Deleted

## 2021-11-11 ENCOUNTER — Ambulatory Visit: Payer: 59 | Admitting: Gastroenterology

## 2021-11-13 ENCOUNTER — Telehealth: Payer: Self-pay | Admitting: Physician Assistant

## 2021-11-13 NOTE — Telephone Encounter (Signed)
Patient is returning your call.  

## 2021-11-14 ENCOUNTER — Other Ambulatory Visit: Payer: Self-pay | Admitting: *Deleted

## 2021-11-14 DIAGNOSIS — Z1211 Encounter for screening for malignant neoplasm of colon: Secondary | ICD-10-CM

## 2021-11-14 DIAGNOSIS — K7010 Alcoholic hepatitis without ascites: Secondary | ICD-10-CM

## 2021-11-14 DIAGNOSIS — K219 Gastro-esophageal reflux disease without esophagitis: Secondary | ICD-10-CM

## 2021-11-14 NOTE — Telephone Encounter (Signed)
Spoke with patient and she agreed to Monday 3/20 at [facility] @ 8:30 am.

## 2021-11-20 NOTE — Telephone Encounter (Signed)
Prep information sent via mychart.

## 2021-12-02 ENCOUNTER — Other Ambulatory Visit (HOSPITAL_COMMUNITY): Payer: Self-pay

## 2021-12-03 NOTE — Progress Notes (Signed)
12/04/2021 Jamie Coleman [id number] 1971/05/20   ASSESSMENT AND PLAN:   Rectal fissure -     diltiazem 2 % GEL; Apply 1 application topically 3 (three) times daily. -     lidocaine (XYLOCAINE) 2 % jelly; apply small amount topically to rectum 2-3 times a day We discussed anal fissures and long healing process Sitz baths, high-fiber diet, add on benefiber.  Long discussion about preventing constipation discussed in detail for prevention.  Diltiazem/lidocaine 3 x daily sent to compound pharmacy [place]  Follow up should symptoms persist for secondary evaluation and possible surgical referral for repair.  Internal hemorrhoids -     HYDROCORTISONE ACE, RECTAL, 30 MG SUPP; Place 1 suppository (30 mg total) rectally at bedtime. -Sitz baths, increase fiber, increase water. -Hydrocortisone supp give and external cream sent in.  -We discussed surgical referral to discuss hemorrhoid banding or surgical treatment if hemorrhoids do not improve with conservative treatment. - suggest keeping colon/EGD in 3 weeks, can help evaluate if surgical intervention is needed.   Gastroesophageal reflux disease without esophagitis she reports symptoms are currently well controlled, and denies breakthrough reflux, burning in chest, hoarseness or cough. Lifestyle changes discussed, avoid NSAIDS Continue current medications  Alcoholic hepatitis, unspecified whether ascites present Has abstained from ETOH  Future Appointments  Date Time Provider [place]  07/22/2022  8:00 AM Nunzio Cobbs, MD [facility] None    History of Present Illness:  51 y.o. female  with a past medical history of reflux and alcoholic hepatitis (hospitalized 09/17/2021 to 09/18/2021 with jaundice and others listed below, known to Dr. Rush Landmark returns to clinic today for evaluation of rectal bleeding, pain.  She is on the omeprazole and has not been drinking ETOH, states her GERD is much better.   Patient  contacted the office 12/01/2021 complaining of worsening hemorrhoids or possible fissure. States since Dec when she took the lactulose she has had worsening hemorrhoids.  She states she has pain with sitting/lying only, no issue with walking/moving.  She has not had any rectal bleeding since dec.  Her BM's are formed soft stool 1-2 x a day, has been eating prunes daily.   Previous GI history:  10/10/2021 other liver serologies including ANA, ASMA, hepatitis a and B, AMA and immunoglobulins were all normal.  Hepatitis B surface antibody was positive showing that she had been previously vaccinated 09/17/2021 to 09/18/2021 with jaundice with ETOH hepatitis Last visit with Ellouise Newer, [place] on 11/04/2021 patient had abstained for alcohol 3 months at that point and given omeprazole. Patient set up for colonoscopy and endoscopy with Dr. Rush Landmark 12/29/2021  CBC  11/04/2021  HGB 12.8 MCV 101.4 without evidence of anemia WBC 5.1 Platelets 183.0 11/04/2021  B12 513 Kidney function 11/04/2021  BUN 9 Cr 0.62  GFR >60  Potassium 3.8   LFTs 11/04/2021  AST 32 ALT 17 Alkphos 96 TBili 1.2 09/17/2021 LIPASE 39 07/15/2020 TSH 6.651  Current Medications:    Current Outpatient Medications (Cardiovascular):    diltiazem 2 % GEL*, Apply 1 application topically 3 (three) times daily.  Current Outpatient Medications (Respiratory):    fexofenadine (ALLEGRA) 180 MG tablet, Take 180 mg by mouth daily as needed.  Current Outpatient Medications (Analgesics):    ibuprofen (ADVIL,MOTRIN) 800 MG tablet, Take 1 tablet (800 mg total) by mouth every 8 (eight) hours as needed (with food).   Current Outpatient Medications (Other):    ALPRAZolam (XANAX) 0.5 MG tablet, Take 1 tablet (0.5 mg total)  by mouth at bedtime as needed for anxiety.   Ascorbic Acid (VITAMIN C) 1000 MG tablet, Take 1,000 mg by mouth daily.   Calcium Carbonate-Vit D-Min 600-400 MG-UNIT TABS, Take 1 tablet by mouth daily.   Cholecalciferol  (VITAMIN D3) 5000 units TABS, Take 1 tablet by mouth every other day.   diltiazem 2 % GEL*, Apply 1 application topically 3 (three) times daily.   fluorometholone (FML LIQUIFILM) 0.1 % ophthalmic suspension, Place 1 drop into both eyes once a day as needed   hydrocortisone (ANUSOL-HC) 2.5 % rectal cream, Place 1 application rectally 2 (two) times daily.   HYDROCORTISONE ACE, RECTAL, 30 MG SUPP, Place 1 suppository (30 mg total) rectally at bedtime.   lidocaine (XYLOCAINE) 2 % jelly, apply small amount topically to rectum 2-3 times a day   Multiple Vitamin (MULTIVITAMIN) capsule, Take 1 capsule by mouth daily.     Omega-3 Fatty Acids (FISH OIL PO), Take by mouth daily.     omeprazole (PRILOSEC) 20 MG capsule, Take 20 mg by mouth daily. * These medications belong to multiple therapeutic classes and are listed under each applicable group.  Surgical History:  She  has a past surgical history that includes Cesarean section; Foot surgery (Left); Tonsillectomy; Endometrial ablation; Laparoscopic tubal ligation (06/09/2011); Ligament repair (Left, 05/08/2016); and Extremity wire/pin removal (Left, 07/16/2016). Family History:  Her family history includes Alzheimer's disease in her father; Cancer in her mother; Colon polyps in her father; Diabetes in her brother, father, maternal grandfather, paternal grandfather, and paternal grandmother; Heart disease in her father. Social History:   reports that she quit smoking about 38 years ago. Her smoking use included cigarettes. She has never used smokeless tobacco. She reports current alcohol use of about 14.0 standard drinks per week. She reports that she does not use drugs.  Current Medications, Allergies, Past Medical History, Past Surgical History, Family History and Social History were reviewed in [employer] record.  Physical Exam: BP 126/76 (BP Location: Left Arm, Patient Position: Sitting, Cuff Size: Normal)    Pulse 100    Ht 5'  1.5" (1.562 m)    Wt 172 lb (78 kg)    BMI 31.97 kg/m  General:   Pleasant, well developed female in no acute distress Abdomen:  Soft, Obese AB, skin exam normal, Normal bowel sounds.  no  tenderness . Without guarding and Without rebound, without hepatomegaly. Rectal exam: external skin tags, internal hemorrhoids noted, anal fissure posterior with rectal pain with internal exam, stool guaiac negative. Extremities:  Without edema. Peripheral pulses intact.  Neurologic:  Alert and  oriented x4;  grossly normal neurologically. Skin:   Dry and intact without significant lesions or rashes. Psychiatric: Demonstrates good judgement and reason without abnormal affect or behaviors.  Vladimir Crofts, PA-C 12/04/21

## 2021-12-04 ENCOUNTER — Other Ambulatory Visit (HOSPITAL_COMMUNITY): Payer: Self-pay

## 2021-12-04 ENCOUNTER — Ambulatory Visit (INDEPENDENT_AMBULATORY_CARE_PROVIDER_SITE_OTHER): Payer: 59 | Admitting: Physician Assistant

## 2021-12-04 ENCOUNTER — Encounter: Payer: Self-pay | Admitting: Physician Assistant

## 2021-12-04 VITALS — BP 126/76 | HR 100 | Ht 61.5 in | Wt 172.0 lb

## 2021-12-04 DIAGNOSIS — K7010 Alcoholic hepatitis without ascites: Secondary | ICD-10-CM | POA: Diagnosis not present

## 2021-12-04 DIAGNOSIS — K648 Other hemorrhoids: Secondary | ICD-10-CM

## 2021-12-04 DIAGNOSIS — K602 Anal fissure, unspecified: Secondary | ICD-10-CM | POA: Diagnosis not present

## 2021-12-04 DIAGNOSIS — K219 Gastro-esophageal reflux disease without esophagitis: Secondary | ICD-10-CM | POA: Diagnosis not present

## 2021-12-04 MED ORDER — HYDROCORTISONE ACETATE 30 MG RE SUPP
30.0000 mg | Freq: Every day | RECTAL | 1 refills | Status: DC
  Filled 2021-12-04: qty 12, 12d supply, fill #0

## 2021-12-04 MED ORDER — DILTIAZEM GEL 2 %: 1.0000 "application " | Freq: Three times a day (TID) | CUTANEOUS | 3 refills | Status: DC

## 2021-12-04 MED ORDER — LIDOCAINE HCL URETHRAL/MUCOSAL 2 % EX GEL: CUTANEOUS | 0 refills | Status: DC

## 2021-12-04 MED ORDER — HYDROCORTISONE ACETATE 25 MG RE SUPP
25.0000 mg | Freq: Every day | RECTAL | 1 refills | Status: DC
Start: 2021-12-04 — End: 2022-05-05
  Filled 2021-12-04: qty 12, 12d supply, fill #0

## 2021-12-04 NOTE — Addendum Note (Signed)
Addended by: Vicie Mutters on: 12/04/2021 10:38 AM   Modules accepted: Orders

## 2021-12-04 NOTE — Patient Instructions (Signed)
[facility]:  The following medication has been prescribed:  [facility] Address: [address], [place], [place] [address]  [phone]  Please DO NOT go directly from our office to pick up this medication! Give the pharmacy 1 day to process the prescription. Extra time is required for them to compound your medication.  Anal Fissure, Adult  Diltiazem/lidocaine 3 x daily for 2 months sent to compound pharmacy [place] [facility] Follow up should symptoms persist for secondary evaluation and possible surgical referral for repair. An anal fissure is a small tear or crack in the skin around the anus. Bleeding from a fissure usually stops on its own within a few minutes. However, bleeding will often occur again with each bowel movement until the crack heals. CAUSES This condition may be caused by: Passing large, hard stool (feces). Frequent diarrhea. Constipation. Inflammatory bowel disease (Crohn disease or ulcerative colitis). Infections. Anal sex. SYMPTOMS Symptoms of this condition include: Bleeding from the rectum. Small amounts of blood seen on your stool, on toilet paper, or in the toilet after a bowel movement. Painful bowel movements. Itching or irritation around the anus. DIAGNOSIS  A health care provider may diagnose this condition by closely examining the anal area. An anal fissure can usually be seen with careful inspection. In some cases, a rectal exam may be performed, or a short tube (anoscope) may be used to examine the anal canal. TREATMENT Treatment for this condition may include: Taking steps to avoid constipation. This may include making changes to your diet, such as increasing your intake of fiber or fluid. Taking fiber supplements. These supplements can soften your stool to help make bowel movements easier. Your health care provider may also prescribe a stool softener if your stool is often hard. Taking sitz baths. This may help to heal the  tear. Using medicated creams or ointments. These may be prescribed to lessen discomfort. HOME CARE INSTRUCTIONS Eating and Drinking Avoid foods that may be constipating, such as bananas and dairy products. Drink enough fluid to keep your urine clear or pale yellow. Maintain a diet that is high in fruits, whole grains, and vegetables. General Instructions Keep the anal area as clean and dry as possible. Take sitz baths as told by your health care provider. Do not use soap in the sitz baths. Take over-the-counter and prescription medicines only as told by your health care provider. Use creams or ointments only as told by your health care provider. Keep all follow-up visits as told by your health care provider. This is important. SEEK MEDICAL CARE IF: You have more bleeding. You have a fever. You have diarrhea that is mixed with blood. You continue to have pain. Your problem is getting worse rather than better.   This information is not intended to replace advice given to you by your health care provider. Make sure you discuss any questions you have with your health care provider.   Document Released: 09/28/2005 Document Revised: 06/19/2015 Document Reviewed: 12/24/2014 Elsevier Interactive Patient Education 2016 [place].  Please do sitz baths, increase fiber or add benefiber, increase water and increase acitivity.  Will send in hydrocoritsone suppository  About Hemorrhoids  Hemorrhoids are swollen veins in the lower rectum and anus.  Also called piles, hemorrhoids are a common problem.  Hemorrhoids may be internal (inside the rectum) or external (around the anus).  Internal Hemorrhoids  Internal hemorrhoids are often painless, but they rarely cause bleeding.  The internal veins may stretch and fall down (prolapse)  through the anus to the outside of the body.  The veins may then become irritated and painful.  External Hemorrhoids  External hemorrhoids can be easily seen or  felt around the anal opening.  They are under the skin around the anus.  When the swollen veins are scratched or broken by straining, rubbing or wiping they sometimes bleed.  How Hemorrhoids Occur  Veins in the rectum and around the anus tend to swell under pressure.  Hemorrhoids can result from increased pressure in the veins of your anus or rectum.  Some sources of pressure are:  Straining to have a bowel movement because of constipation Waiting too long to have a bowel movement Coughing and sneezing often Sitting for extended periods of time, including on the toilet Diarrhea Obesity Trauma or injury to the anus Some liver diseases Stress Family history of hemorrhoids Pregnancy  Pregnant women should try to avoid becoming constipated, because they are more likely to have hemorrhoids during pregnancy.  In the last trimester of pregnancy, the enlarged uterus may press on blood vessels and causes hemorrhoids.  In addition, the strain of childbirth sometimes causes hemorrhoids after the birth.  Symptoms of Hemorrhoids  Some symptoms of hemorrhoids include: Swelling and/or a tender lump around the anus Itching, mild burning and bleeding around the anus Painful bowel movements with or without constipation Bright red blood covering the stool, on toilet paper or in the toilet bowel.   Symptoms usually go away within a few days.  Always talk to your doctor about any bleeding to make sure it is not from some other causes.  Diagnosing and Treating Hemorrhoids  Diagnosis is made by an examination by your healthcare provider.  Special test can be performed by your doctor.    Most cases of hemorrhoids can be treated with: High-fiber diet: Eat more high-fiber foods, which help prevent constipation.  Ask for more detailed fiber information on types and sources of fiber from your healthcare provider. Fluids: Drink plenty of water.  This helps soften bowel movements so they are easier to  pass. Sitz baths and cold packs: Sitting in lukewarm water two or three times a day for 15 minutes cleases the anal area and may relieve discomfort.  If the water is too hot, swelling around the anus will get worse.  Placing a cloth-covered ice pack on the anus for ten minutes four times a day can also help reduce selling.  Gently pushing a prolapsed hemorrhoid back inside after the bath or ice pack can be helpful. Medications: For mild discomfort, your healthcare provider may suggest over-the-counter pain medication or prescribe a cream or ointment for topical use.  The cream may contain witch hazel, zinc oxide or petroleum jelly.  Medicated suppositories are also a treatment option.  Always consult your doctor before applying medications or creams. Procedures and surgeries: There are also a number of procedures and surgeries to shrink or remove hemorrhoids in more serious cases.  Talk to your physician about these options.  You can often prevent hemorrhoids or keep them from becoming worse by maintaining a healthy lifestyle.  Eat a fiber-rich diet of fruits, vegetables and whole grains.  Also, drink plenty of water and exercise regularly.   2007, [employer] Doc.30

## 2021-12-04 NOTE — Progress Notes (Signed)
Attending Physician's Attestation   I have reviewed the chart.   I agree with the Advanced Practitioner's note, impression, and recommendations with any updates as below.    Dally Oshel Mansouraty, MD [facility] Office # [phone]  

## 2021-12-08 ENCOUNTER — Telehealth: Payer: 59 | Admitting: Nurse Practitioner

## 2021-12-08 ENCOUNTER — Other Ambulatory Visit (HOSPITAL_COMMUNITY): Payer: Self-pay

## 2021-12-08 DIAGNOSIS — H10023 Other mucopurulent conjunctivitis, bilateral: Secondary | ICD-10-CM | POA: Diagnosis not present

## 2021-12-08 MED ORDER — POLYMYXIN B-TRIMETHOPRIM 10000-0.1 UNIT/ML-% OP SOLN
1.0000 [drp] | Freq: Four times a day (QID) | OPHTHALMIC | 0 refills | Status: AC
  Filled 2021-12-08: qty 10, 30d supply, fill #0

## 2021-12-08 NOTE — Progress Notes (Signed)
E-Visit for Jamie Coleman   We are sorry that you are not feeling well.  Here is how we plan to help!  Based on what you have shared with me it looks like you have conjunctivitis.  Conjunctivitis is a common inflammatory or infectious condition of the eye that is often referred to as "pink eye".  In most cases it is contagious (viral or bacterial). However, not all conjunctivitis requires antibiotics (ex. Allergic).  We have made appropriate suggestions for you based upon your presentation.  I have prescribed Polytrim Ophthalmic drops 1-2 drops 4 times a day times 5 days  Pink eye can be highly contagious.  It is typically spread through direct contact with secretions, or contaminated objects or surfaces that one may have touched.  Strict handwashing is suggested with soap and water is urged.  If not available, use alcohol based had sanitizer.  Avoid unnecessary touching of the eye.  If you wear contact lenses, you will need to refrain from wearing them until you see no white discharge from the eye for at least 24 hours after being on medication.  You should see symptom improvement in 1-2 days after starting the medication regimen.  Call us if symptoms are not improved in 1-2 days.  Home Care: Wash your hands often! Do not wear your contacts until you complete your treatment plan. Avoid sharing towels, bed linen, personal items with a person who has pink eye. See attention for anyone in your home with similar symptoms.  Get Help Right Away If: Your symptoms do not improve. You develop blurred or loss of vision. Your symptoms worsen (increased discharge, pain or redness)   Thank you for choosing an e-visit.  Your e-visit answers were reviewed by a board certified advanced clinical practitioner to complete your personal care plan. Depending upon the condition, your plan could have included both over the counter or prescription medications.  Please review your pharmacy choice. Make sure the  pharmacy is open so you can pick up prescription now. If there is a problem, you may contact your provider through [employer] and have the prescription routed to another pharmacy.  Your safety is important to [place]. If you have drug allergies check your prescription carefully.   For the next 24 hours you can use MyChart to ask questions about today's visit, request a non-urgent call back, or ask for a work or school excuse. You will get an email in the next two days asking about your experience. I hope that your e-visit has been valuable and will speed your recovery.  I spent approximately 7 minutes reviewing the patient's history, current symptoms and coordinating their plan of care today.    Meds ordered this encounter  Medications   trimethoprim-polymyxin b (POLYTRIM) ophthalmic solution    Sig: Place 1 drop into both eyes in the morning, at noon, in the evening, and at bedtime for 7 days.    Dispense:  10 mL    Refill:  0

## 2021-12-17 ENCOUNTER — Encounter: Payer: 59 | Admitting: Gastroenterology

## 2022-01-20 ENCOUNTER — Other Ambulatory Visit: Payer: Self-pay | Admitting: Family Medicine

## 2022-01-20 ENCOUNTER — Other Ambulatory Visit (HOSPITAL_COMMUNITY): Payer: Self-pay

## 2022-01-20 MED ORDER — ALPRAZOLAM 0.5 MG PO TABS
0.5000 mg | ORAL_TABLET | Freq: Every evening | ORAL | 0 refills | Status: DC | PRN
  Filled 2022-01-20: qty 30, 30d supply, fill #0

## 2022-01-20 NOTE — Telephone Encounter (Signed)
Name of Medication: Xanax ?Name of Pharmacy: [facility]  ?Last Fill or Written Date and Quantity: 09/24/21 #30 tabs/ 1 refill ?Last Office Visit and Type: Hospital f/u on 09/24/21 ?Next Office Visit and Type: none scheduled  ? ?  ?

## 2022-02-09 ENCOUNTER — Ambulatory Visit (HOSPITAL_COMMUNITY)
Admission: RE | Admit: 2022-02-09 | Discharge: 2022-02-09 | Disposition: A | Discharge status: Home / Self Care | Payer: 59 | Attending: Gastroenterology | Admitting: Gastroenterology

## 2022-02-09 ENCOUNTER — Encounter (HOSPITAL_COMMUNITY)
Admission: RE | Disposition: A | Discharge status: Home / Self Care | Payer: Self-pay | Source: Home / Self Care | Attending: Gastroenterology

## 2022-02-09 ENCOUNTER — Ambulatory Visit (HOSPITAL_COMMUNITY): Payer: 59 | Admitting: Certified Registered"

## 2022-02-09 ENCOUNTER — Encounter (HOSPITAL_COMMUNITY): Payer: Self-pay | Admitting: Gastroenterology

## 2022-02-09 ENCOUNTER — Other Ambulatory Visit: Payer: Self-pay

## 2022-02-09 ENCOUNTER — Other Ambulatory Visit (HOSPITAL_COMMUNITY): Payer: Self-pay

## 2022-02-09 ENCOUNTER — Ambulatory Visit (HOSPITAL_BASED_OUTPATIENT_CLINIC_OR_DEPARTMENT_OTHER): Payer: 59 | Admitting: Certified Registered"

## 2022-02-09 DIAGNOSIS — K2289 Other specified disease of esophagus: Secondary | ICD-10-CM | POA: Diagnosis not present

## 2022-02-09 DIAGNOSIS — K5730 Diverticulosis of large intestine without perforation or abscess without bleeding: Secondary | ICD-10-CM | POA: Insufficient documentation

## 2022-02-09 DIAGNOSIS — K635 Polyp of colon: Secondary | ICD-10-CM

## 2022-02-09 DIAGNOSIS — K7010 Alcoholic hepatitis without ascites: Secondary | ICD-10-CM

## 2022-02-09 DIAGNOSIS — K219 Gastro-esophageal reflux disease without esophagitis: Secondary | ICD-10-CM

## 2022-02-09 DIAGNOSIS — K621 Rectal polyp: Secondary | ICD-10-CM

## 2022-02-09 DIAGNOSIS — K3189 Other diseases of stomach and duodenum: Secondary | ICD-10-CM | POA: Diagnosis not present

## 2022-02-09 DIAGNOSIS — K644 Residual hemorrhoidal skin tags: Secondary | ICD-10-CM | POA: Insufficient documentation

## 2022-02-09 DIAGNOSIS — K2950 Unspecified chronic gastritis without bleeding: Secondary | ICD-10-CM | POA: Diagnosis not present

## 2022-02-09 DIAGNOSIS — K648 Other hemorrhoids: Secondary | ICD-10-CM | POA: Diagnosis not present

## 2022-02-09 DIAGNOSIS — K641 Second degree hemorrhoids: Secondary | ICD-10-CM | POA: Insufficient documentation

## 2022-02-09 DIAGNOSIS — R12 Heartburn: Secondary | ICD-10-CM | POA: Diagnosis not present

## 2022-02-09 DIAGNOSIS — K319 Disease of stomach and duodenum, unspecified: Secondary | ICD-10-CM | POA: Diagnosis not present

## 2022-02-09 DIAGNOSIS — Z1211 Encounter for screening for malignant neoplasm of colon: Secondary | ICD-10-CM

## 2022-02-09 DIAGNOSIS — K449 Diaphragmatic hernia without obstruction or gangrene: Secondary | ICD-10-CM | POA: Insufficient documentation

## 2022-02-09 DIAGNOSIS — F419 Anxiety disorder, unspecified: Secondary | ICD-10-CM | POA: Diagnosis not present

## 2022-02-09 HISTORY — PX: ESOPHAGOGASTRODUODENOSCOPY (EGD) WITH PROPOFOL: SHX5813

## 2022-02-09 HISTORY — PX: BIOPSY: SHX5522

## 2022-02-09 HISTORY — PX: COLONOSCOPY WITH PROPOFOL: SHX5780

## 2022-02-09 HISTORY — PX: POLYPECTOMY: SHX5525

## 2022-02-09 SURGERY — COLONOSCOPY WITH PROPOFOL
Anesthesia: Monitor Anesthesia Care

## 2022-02-09 MED ORDER — LACTATED RINGERS IV SOLN
INTRAVENOUS | Status: DC
  Administered 2022-02-09: 1000 mL via INTRAVENOUS

## 2022-02-09 MED ORDER — ONDANSETRON HCL 4 MG/2ML IJ SOLN
INTRAMUSCULAR | Status: DC | PRN
  Administered 2022-02-09: 4 mg via INTRAVENOUS

## 2022-02-09 MED ORDER — MIDAZOLAM HCL 5 MG/5ML IJ SOLN
INTRAMUSCULAR | Status: DC | PRN
  Administered 2022-02-09 (×2): 1 mg via INTRAVENOUS

## 2022-02-09 MED ORDER — MIDAZOLAM HCL 2 MG/2ML IJ SOLN
INTRAMUSCULAR | Status: AC
  Filled 2022-02-09: qty 2

## 2022-02-09 MED ORDER — SODIUM CHLORIDE 0.9 % IV SOLN: INTRAVENOUS | Status: DC

## 2022-02-09 MED ORDER — LIDOCAINE 2% (20 MG/ML) 5 ML SYRINGE
INTRAMUSCULAR | Status: DC | PRN
Start: 2022-02-09 — End: 2022-02-09
  Administered 2022-02-09: 100 mg via INTRAVENOUS

## 2022-02-09 MED ORDER — PROPOFOL 1000 MG/100ML IV EMUL
INTRAVENOUS | Status: AC
  Filled 2022-02-09: qty 100

## 2022-02-09 MED ORDER — HYDROCORTISONE ACETATE 25 MG RE SUPP
25.0000 mg | Freq: Every evening | RECTAL | 1 refills | Status: AC | PRN
  Filled 2022-02-09: qty 12, 12d supply, fill #0
  Filled 2022-08-28: qty 12, 12d supply, fill #1

## 2022-02-09 MED ORDER — PROPOFOL 10 MG/ML IV BOLUS
INTRAVENOUS | Status: DC | PRN
  Administered 2022-02-09 (×7): 30 mg via INTRAVENOUS

## 2022-02-09 MED ORDER — PROPOFOL 500 MG/50ML IV EMUL
INTRAVENOUS | Status: DC | PRN
  Administered 2022-02-09: 125 ug/kg/min via INTRAVENOUS

## 2022-02-09 SURGICAL SUPPLY — 25 items

## 2022-02-09 NOTE — Op Note (Signed)
[facility] ?Patient Name: Jamie Coleman ?Procedure Date: 02/09/2022 ?MRN: [id number] ?Attending MD: Justice Britain , MD ?Date of Birth: 1971/08/28 ?CSN: [id number] ?Age: 51 ?Admit Type: Outpatient ?Procedure:                Colonoscopy ?Indications:              Screening for colorectal malignant neoplasm, This  ?                          is the patient's first colonoscopy ?Providers:                Justice Britain, MD, Elna Breslow, RN, Hope  ?                          Jerline Pain, Technician ?Referring MD:             Wynelle Fanny. Tower, Erling Conte  ?                          Silverio Lay ?Medicines:                Monitored Anesthesia Care ?Complications:            No immediate complications. ?Estimated Blood Loss:     Estimated blood loss was minimal. ?Procedure:                Pre-Anesthesia Assessment: ?                          - Prior to the procedure, a History and Physical  ?                          was performed, and patient medications and  ?                          allergies were reviewed. The patient's tolerance of  ?                          previous anesthesia was also reviewed. The risks  ?                          and benefits of the procedure and the sedation  ?                          options and risks were discussed with the patient.  ?                          All questions were answered, and informed consent  ?                          was obtained. Prior Anticoagulants: The patient has  ?                          taken no previous anticoagulant or antiplatelet  ?                          agents except  for NSAID medication. Jamie Grade  ?                          Assessment: II - A patient with mild systemic  ?                          disease. After reviewing the risks and benefits,  ?                          the patient was deemed in satisfactory condition to  ?                          undergo the procedure. ?                          After obtaining  informed consent, the colonoscope  ?                          was passed under direct vision. Throughout the  ?                          procedure, the patient's blood pressure, pulse, and  ?                          oxygen saturations were monitored continuously. The  ?                          [id number]-HQ190L ([id number]) Olympus colonoscope was  ?                          introduced through the anus and advanced to the 8  ?                          cm into the ileum. The colonoscopy was performed  ?                          without difficulty. The patient tolerated the  ?                          procedure. The quality of the bowel preparation was  ?                          adequate. The terminal ileum, ileocecal valve,  ?                          appendiceal orifice, and rectum were photographed. ?Scope In: 12:51:31 PM ?Scope Out: 1:08:00 PM ?Scope Withdrawal Time: 0 hours 13 minutes 9 seconds  ?Total Procedure Duration: 0 hours 16 minutes 29 seconds  ?Findings: ?     Skin tags were found on perianal exam. ?     The digital rectal exam findings include hemorrhoids. Pertinent  ?     negatives include no palpable rectal lesions. ?     The terminal ileum and ileocecal valve appeared normal. ?     Multiple small-mouthed diverticula were found in  the sigmoid colon,  ?     descending colon and transverse colon. ?     Two sessile polyps were found in the rectum and recto-sigmoid colon. The  ?     polyps were 3 to 4 mm in size. These polyps were removed with a cold  ?     snare. Resection and retrieval were complete. ?     Normal mucosa was found in the entire colon otherwise. ?     Non-bleeding non-thrombosed external and internal hemorrhoids were found  ?     during retroflexion, during perianal exam and during digital exam. The  ?     hemorrhoids were Grade II (internal hemorrhoids that prolapse but reduce  ?     spontaneously). ?Impression:               - Perianal skin tags found on perianal exam. ?                           - Hemorrhoids found on digital rectal exam. ?                          - The examined portion of the ileum was normal. ?                          - Diverticulosis in the sigmoid colon, in the  ?                          descending colon and in the transverse colon. ?                          - Two 3 to 4 mm polyps in the rectum and at the  ?                          recto-sigmoid colon, removed with a cold snare.  ?                          Resected and retrieved. ?                          - Normal mucosa in the entire examined colon  ?                          otherwise. ?                          - Non-bleeding non-thrombosed external and internal  ?                          hemorrhoids. ?Moderate Sedation: ?     Not Applicable - Patient had care per Anesthesia. ?Recommendation:           - The patient will be observed post-procedure,  ?                          until all discharge criteria are met. ?                          -  Discharge patient to home. ?                          - Patient has a contact number available for  ?                          emergencies. The signs and symptoms of potential  ?                          delayed complications were discussed with the  ?                          patient. Return to normal activities tomorrow.  ?                          Written discharge instructions were provided to the  ?                          patient. ?                          - High fiber diet. ?                          - Use FiberCon 1-2 tablets PO daily. ?                          - Anusol suppositories sent (if significant  ?                          hemorroidal issues then use nightly x 1 week and  ?                          then every other night until Rx is done, otherwise  ?                          consider use for 3 nights in a row). ?                          - Continue present medications. ?                          - Await pathology results. ?                          - Repeat  colonoscopy in 5-10 years for surveillance  ?                          based on pathology results. ?                          - The findings and recommendations were discussed  ?                          with the patient. ?                          -  The findings and recommendations were discussed  ?                          with the patient's family. ?Procedure Code(s):        --- Professional --- ?                          [phone], Colonoscopy, flexible; with removal of  ?                          tumor(s), polyp(s), or other lesion(s) by snare  ?                          technique ?Diagnosis Code(s):        --- Professional --- ?                          Z12.11, Encounter for screening for malignant  ?                          neoplasm of colon ?                          K64.1, Second degree hemorrhoids ?                          K62.1, Rectal polyp ?                          K63.5, Polyp of colon ?                          K64.4, Residual hemorrhoidal skin tags ?                          K57.30, Diverticulosis of large intestine without  ?                          perforation or abscess without bleeding ?CPT copyright 2019 American Medical Association. All rights reserved. ?The codes documented in this report are preliminary and upon coder review may  ?be revised to meet current compliance requirements. ?Justice Britain, MD ?02/09/2022 1:26:03 PM ?Number of Addenda: 0 ?

## 2022-02-09 NOTE — Transfer of Care (Signed)
Immediate Anesthesia Transfer of Care Note ? ?Patient: Jamie Coleman ? ?Procedure(s) Performed: COLONOSCOPY WITH PROPOFOL ?ESOPHAGOGASTRODUODENOSCOPY (EGD) WITH PROPOFOL ?BIOPSY ?POLYPECTOMY ? ?Patient Location: PACU ? ?Anesthesia Type:MAC ? ?Level of Consciousness: awake and alert  ? ?Airway & Oxygen Therapy: Patient Spontanous Breathing and Patient connected to face mask oxygen ? ?Post-op Assessment: Report given to RN and Post -op Vital signs reviewed and stable ? ?Post vital signs: Reviewed and stable ? ?Last Vitals:  ?Vitals Value Taken Time  ?BP    ?Temp    ?Pulse    ?Resp    ?SpO2    ? ? ?Last Pain:  ?Vitals:  ? 02/09/22 1146  ?TempSrc: Oral  ?PainSc: 0-No pain  ?   ? ?  ? ?Complications: No notable events documented. ?

## 2022-02-09 NOTE — Op Note (Signed)
[facility] ?Patient Name: Jamie Coleman ?Procedure Date: 02/09/2022 ?MRN: [id number] ?Attending MD: Justice Britain , MD ?Date of Birth: 1971-05-27 ?CSN: [id number] ?Age: 51 ?Admit Type: Outpatient ?Procedure:                Upper GI endoscopy ?Indications:              Heartburn, Screening for Barrett's esophagus in  ?                          patient at risk for this condition, History of  ?                          Alcoholic hepatitis to rule out portal hypertension  ?                          as well (not LFTs normalized) ?Providers:                Justice Britain, MD, Elna Breslow, RN, Hope  ?                          Jerline Pain, Technician ?Referring MD:             Wynelle Fanny. Tower, Erling Conte  ?                          Silverio Lay ?Medicines:                Monitored Anesthesia Care ?Complications:            No immediate complications. ?Estimated Blood Loss:     Estimated blood loss was minimal. ?Procedure:                Pre-Anesthesia Assessment: ?                          - Prior to the procedure, a History and Physical  ?                          was performed, and patient medications and  ?                          allergies were reviewed. The patient's tolerance of  ?                          previous anesthesia was also reviewed. The risks  ?                          and benefits of the procedure and the sedation  ?                          options and risks were discussed with the patient.  ?                          All questions were answered, and informed consent  ?  was obtained. Prior Anticoagulants: The patient has  ?                          taken no previous anticoagulant or antiplatelet  ?                          agents except for NSAID medication. ASA Grade  ?                          Assessment: II - A patient with mild systemic  ?                          disease. After reviewing the risks and benefits,  ?                           the patient was deemed in satisfactory condition to  ?                          undergo the procedure. ?                          After obtaining informed consent, the endoscope was  ?                          passed under direct vision. Throughout the  ?                          procedure, the patient's blood pressure, pulse, and  ?                          oxygen saturations were monitored continuously. The  ?                          [id number]-[id number] ([id number]) Olympus endoscope was introduced  ?                          through the mouth, and advanced to the second part  ?                          of duodenum. ?Scope In: ?Scope Out: ?Findings: ?     No gross lesions were noted in the entire esophagus. Biopsies were taken  ?     with a cold forceps for histology to rule out EoE/LoE. ?     The Z-line was irregular and was found 35 cm from the incisors. ?     A 1 cm hiatal hernia was present. ?     Diffuse mildly erythematous mucosa without bleeding was found in the  ?     entire examined stomach. Biopsies were taken with a cold forceps for  ?     histology and Helicobacter pylori testing. ?     No gross lesions were noted in the duodenal bulb, in the first portion  ?     of the duodenum and in the second portion of the duodenum. ?Impression:               -  No gross lesions in esophagus. Biopsied. ?                          - Z-line irregular, 35 cm from the incisors. ?                          - 1 cm hiatal hernia. ?                          - Erythematous mucosa in the stomach. Biopsied. ?                          - No gross lesions in the duodenal bulb, in the  ?                          first portion of the duodenum and in the second  ?                          portion of the duodenum. ?Moderate Sedation: ?     Not Applicable - Patient had care per Anesthesia. ?Recommendation:           - Proceed to scheduled colonoscopy. ?                          - Await pathology results. ?                          - Based on  pathology, consider role of increasing  ?                          PPI therapy. ?                          - Continue present medications. ?                          - The findings and recommendations were discussed  ?                          with the patient. ?                          - The findings and recommendations were discussed  ?                          with the patient's family. ?Procedure Code(s):        --- Professional --- ?                          [id number], Esophagogastroduodenoscopy, flexible,  ?                          transoral; with biopsy, single or multiple ?Diagnosis Code(s):        --- Professional --- ?  K22.8, Other specified diseases of esophagus ?                          K44.9, Diaphragmatic hernia without obstruction or  ?                          gangrene ?                          K31.89, Other diseases of stomach and duodenum ?                          R12, Heartburn ?                          Z13.810, Encounter for screening for upper  ?                          gastrointestinal disorder ?CPT copyright 2019 American Medical Association. All rights reserved. ?The codes documented in this report are preliminary and upon coder review may  ?be revised to meet current compliance requirements. ?Justice Britain, MD ?02/09/2022 1:19:55 PM ?Number of Addenda: 0 ?

## 2022-02-09 NOTE — Anesthesia Preprocedure Evaluation (Signed)
Anesthesia Evaluation  ?Patient identified by MRN, date of birth, ID band ?Patient awake ? ? ? ?Reviewed: ?Allergy & Precautions, NPO status , Patient's Chart, lab work & pertinent test results ? ?Airway ?Mallampati: II ? ?TM Distance: >3 FB ?Neck ROM: Full ? ? ? Dental ?no notable dental hx. ? ?  ?Pulmonary ?neg pulmonary ROS, former smoker,  ?  ?Pulmonary exam normal ?breath sounds clear to auscultation ? ? ? ? ? ? Cardiovascular ?negative cardio ROS ?Normal cardiovascular exam ?Rhythm:Regular Rate:Normal ? ? ?  ?Neuro/Psych ?Anxiety negative neurological ROS ? negative psych ROS  ? GI/Hepatic ?Neg liver ROS, GERD  ,  ?Endo/Other  ?negative endocrine ROS ? Renal/GU ?negative Renal ROS  ?negative genitourinary ?  ?Musculoskeletal ? ?(+) Arthritis , Osteoarthritis,   ? Abdominal ?  ?Peds ?negative pediatric ROS ?(+)  Hematology ?negative hematology ROS ?(+)   ?Anesthesia Other Findings ? ? Reproductive/Obstetrics ?negative OB ROS ? ?  ? ? ? ? ? ? ? ? ? ? ? ? ? ?  ?  ? ? ? ? ? ? ? ? ?Anesthesia Physical ?Anesthesia Plan ? ?ASA: 2 ? ?Anesthesia Plan: MAC  ? ?Post-op Pain Management: Minimal or no pain anticipated  ? ?Induction: Intravenous ? ?PONV Risk Score and Plan: 2 and Ondansetron, Propofol infusion and Treatment may vary due to age or medical condition ? ?Airway Management Planned: Nasal Cannula ? ?Additional Equipment:  ? ?Intra-op Plan:  ? ?Post-operative Plan:  ? ?Informed Consent: I have reviewed the patients History and Physical, chart, labs and discussed the procedure including the risks, benefits and alternatives for the proposed anesthesia with the patient or authorized representative who has indicated his/her understanding and acceptance.  ? ? ? ?Dental advisory given ? ?Plan Discussed with: CRNA ? ?Anesthesia Plan Comments:   ? ? ? ? ? ? ?Anesthesia Quick Evaluation ? ?

## 2022-02-09 NOTE — Anesthesia Postprocedure Evaluation (Signed)
Anesthesia Post Note ? ?Patient: HAMDI VARI ? ?Procedure(s) Performed: COLONOSCOPY WITH PROPOFOL ?ESOPHAGOGASTRODUODENOSCOPY (EGD) WITH PROPOFOL ?BIOPSY ?POLYPECTOMY ? ?  ? ?Patient location during evaluation: PACU ?Anesthesia Type: MAC ?Level of consciousness: awake and alert ?Pain management: pain level controlled ?Vital Signs Assessment: post-procedure vital signs reviewed and stable ?Respiratory status: spontaneous breathing, nonlabored ventilation and respiratory function stable ?Cardiovascular status: blood pressure returned to baseline and stable ?Postop Assessment: no apparent nausea or vomiting ?Anesthetic complications: no ? ? ?No notable events documented. ? ?Last Vitals:  ?Vitals:  ? 02/09/22 1340 02/09/22 1345  ?BP: 121/85   ?Pulse: 72 74  ?Resp: 13 17  ?Temp:    ?SpO2: 94% 94%  ?  ?Last Pain:  ?Vitals:  ? 02/09/22 1320  ?TempSrc: Temporal  ?PainSc: 0-No pain  ? ? ?  ?  ?  ?  ?  ?  ? ?Lynda Rainwater ? ? ? ? ?

## 2022-02-09 NOTE — H&P (Signed)
? ?GASTROENTEROLOGY PROCEDURE H&P NOTE  ? ?Primary Care Physician: ?Tower, Wynelle Fanny, MD ? ?HPI: ?Jamie Coleman is a 51 y.o. female who presents for EGD/colonoscopy for evaluation of Barrett's screening and colon cancer screening with history of hemorrhoidal bleeding. ? ?Past Medical History:  ?Diagnosis Date  ? Allergy   ? allergic rhinitis  ? Anxiety   ? situational  ? COVID 05/2021  ? Difficult intubation   ? states 2012 ablation surgery, had diff intubation,glide scope used, not noted in EPIC anesthesia record  ? Elevated hemoglobin A1c 08/08/2015  ? Elevated liver function tests 08/08/2015  ? GERD (gastroesophageal reflux disease)   ? Hyperlipidemia   ? Low serum vitamin D 08/08/2015  ? Status post endometrial ablation   ? Tennis elbow   ? currently in PT-10/16  ? ?Past Surgical History:  ?Procedure Laterality Date  ? CESAREAN SECTION    ? ENDOMETRIAL ABLATION    ? 8/12  ? EXTREMITY WIRE/PIN REMOVAL Left 07/16/2016  ? Procedure: PIN REMOVAL OF LEFT WRIST X 2;  Surgeon: Roseanne Kaufman, MD;  Location: [place];  Service: Orthopedics;  Laterality: Left;  ? FOOT SURGERY Left   ? LAPAROSCOPIC TUBAL LIGATION  06/09/2011  ? Procedure: LAPAROSCOPIC TUBAL LIGATION;  Surgeon: Arloa Koh;  Location: [facility];  Service: Gynecology;  Laterality: N/A;  ? LIGAMENT REPAIR Left 05/08/2016  ? Procedure: LEFT WRIST SCAPHOLUNATE LIGAMENT RECONSTRUCTION WITH TENDON GRAFT PIN NEURECTOMY AND REPAIR;  Surgeon: Roseanne Kaufman, MD;  Location: [place];  Service: Orthopedics;  Laterality: Left;  ? TONSILLECTOMY    ? as child  ? ?Current Facility-Administered Medications  ?Medication Dose Route Frequency Provider Last Rate Last Admin  ? 0.9 %  sodium chloride infusion   Intravenous Continuous Levin Erp, PA      ? ? ?Current Facility-Administered Medications:  ?  0.9 %  sodium chloride infusion, , Intravenous, Continuous, [place], Lavone Nian, [place] ?Allergies  ?Allergen Reactions  ?  Lipitor [Atorvastatin]   ?  myalgias  ? ?Family History  ?Problem Relation Age of Onset  ? Cancer Mother   ?     CA insitu of appendix  ? Heart disease Father   ?     CAD  ? Diabetes Father   ?     type II  ? Alzheimer's disease Father   ? Colon polyps Father   ? Diabetes Brother   ?     type II  ? Diabetes Maternal Grandfather   ? Diabetes Paternal Grandmother   ? Diabetes Paternal Grandfather   ? Breast cancer Neg Hx   ? Colon cancer Neg Hx   ? Stomach cancer Neg Hx   ? Esophageal cancer Neg Hx   ? Pancreatic cancer Neg Hx   ? ?Social History  ? ?Socioeconomic History  ? Marital status: Married  ?  Spouse name: Not on file  ? Number of children: 2  ? Years of education: Not on file  ? Highest education level: Not on file  ?Occupational History  ? Occupation: Nurse  ?Tobacco Use  ? Smoking status: Former  ?  Types: Cigarettes  ?  Quit date: 07/25/1983  ?  Years since quitting: 38.5  ? Smokeless tobacco: Never  ? Tobacco comments:  ?  in high school  ?Vaping Use  ? Vaping Use: Never used  ?Substance and Sexual Activity  ? Alcohol use: Yes  ?  Alcohol/week: 14.0 standard drinks  ?  Types: 14 Glasses  of wine per week  ?  Comment: 6 shots daily 12 shots daily on weekends  ? Drug use: No  ? Sexual activity: Yes  ?  Partners: Male  ?  Birth control/protection: Surgical  ?  Comment: Tubal ligation  ?Other Topics Concern  ? Not on file  ?Social History Narrative  ? Not on file  ? ?Social Determinants of Health  ? ?Financial Resource Strain: Not on file  ?Food Insecurity: Not on file  ?Transportation Needs: Not on file  ?Physical Activity: Not on file  ?Stress: Not on file  ?Social Connections: Not on file  ?Intimate Partner Violence: Not on file  ? ? ?Physical Exam: ?There were no vitals filed for this visit. ?There is no height or weight on file to calculate BMI. ?GEN: NAD ?EYE: Sclerae anicteric ?ENT: MMM ?CV: Non-tachycardic ?GI: Soft, NT/ND ?NEURO:  Alert & Oriented x 3 ? ?Lab Results: ?No results for input(s): WBC,  HGB, HCT, PLT in the last 72 hours. ?BMET ?No results for input(s): NA, K, CL, CO2, GLUCOSE, BUN, CREATININE, CALCIUM in the last 72 hours. ?LFT ?No results for input(s): PROT, ALBUMIN, AST, ALT, ALKPHOS, BILITOT, BILIDIR, IBILI in the last 72 hours. ?PT/INR ?No results for input(s): LABPROT, INR in the last 72 hours. ? ? ?Impression / Plan: ?This is a 51 y.o.female who presents for EGD/colonoscopy for evaluation of Barrett's screening and colon cancer screening with history of hemorrhoidal bleeding. ? ?The risks and benefits of endoscopic evaluation/treatment were discussed with the patient and/or family; these include but are not limited to the risk of perforation, infection, bleeding, missed lesions, lack of diagnosis, severe illness requiring hospitalization, as well as anesthesia and sedation related illnesses.  The patient's history has been reviewed, patient examined, no change in status, and deemed stable for procedure.  The patient and/or family is agreeable to proceed.  ? ? ?Justice Britain, MD ?[facility] ?Advanced Endoscopy ?Office # [phone] ? ?

## 2022-02-10 ENCOUNTER — Encounter (HOSPITAL_COMMUNITY): Payer: Self-pay | Admitting: Gastroenterology

## 2022-02-10 ENCOUNTER — Other Ambulatory Visit: Payer: Self-pay

## 2022-02-10 ENCOUNTER — Telehealth: Payer: Self-pay | Admitting: Gastroenterology

## 2022-02-10 ENCOUNTER — Other Ambulatory Visit (HOSPITAL_COMMUNITY): Payer: Self-pay

## 2022-02-10 LAB — SURGICAL PATHOLOGY

## 2022-02-10 MED ORDER — LIDOCAINE VISCOUS HCL 2 % MT SOLN
OROMUCOSAL | 0 refills | Status: DC
  Filled 2022-02-10: qty 360, fill #0

## 2022-02-10 MED ORDER — DIPHENHYDRAMINE HCL 12.5 MG/5ML PO LIQD
ORAL | 0 refills | Status: DC
  Filled 2022-02-10: qty 240, 60d supply, fill #0

## 2022-02-10 NOTE — Telephone Encounter (Signed)
Thanks. GM 

## 2022-02-10 NOTE — Telephone Encounter (Signed)
I followed up with the patient today. ?She has done well overall from her EGD/colonoscopy. ?However, she has noted a discomfort in her throat at the region of her sternal notch. ?She has some difficulty swallowing without having some mild odynophagia. ?This is most likely irritation from the scope passage rather than biopsies that were obtained but with that being said we will plan to try and treat this. ? ?Location manager, ?Please move forward with ordering Magic mouthwash with lidocaine.  We will plan to use 5 to 10 mL every 6 hour as needed.  This will be a swish and swallow.  Patient should also continue to use Cepacol lozenges and Chloraseptic spray in the interim. ? ?If issues with obtaining Magic mouthwash then a GI cocktail with lidocaine can be used every 6 hours as needed with swallow. ? ?Please work on getting the patient's medication sent and update her and myself. ? ?Thanks. ?GM ?

## 2022-02-10 NOTE — Telephone Encounter (Signed)
Spoke with patient regarding MD recommendations. She is aware that magic mouthwash has been sent to pharmacy.  ?

## 2022-02-11 ENCOUNTER — Encounter: Payer: Self-pay | Admitting: Gastroenterology

## 2022-02-11 ENCOUNTER — Other Ambulatory Visit: Payer: Self-pay

## 2022-02-11 DIAGNOSIS — K7010 Alcoholic hepatitis without ascites: Secondary | ICD-10-CM

## 2022-02-13 ENCOUNTER — Other Ambulatory Visit (HOSPITAL_COMMUNITY): Payer: Self-pay

## 2022-02-24 ENCOUNTER — Other Ambulatory Visit (HOSPITAL_COMMUNITY): Payer: Self-pay

## 2022-03-26 ENCOUNTER — Ambulatory Visit: Payer: 59 | Admitting: Family Medicine

## 2022-03-27 ENCOUNTER — Ambulatory Visit: Payer: 59 | Admitting: Family Medicine

## 2022-04-17 ENCOUNTER — Other Ambulatory Visit (HOSPITAL_COMMUNITY): Payer: Self-pay

## 2022-04-17 ENCOUNTER — Other Ambulatory Visit: Payer: Self-pay | Admitting: Family Medicine

## 2022-04-17 MED ORDER — ESCITALOPRAM OXALATE 10 MG PO TABS
10.0000 mg | ORAL_TABLET | Freq: Every day | ORAL | 5 refills | Status: DC
  Filled 2022-04-17: qty 30, 30d supply, fill #0

## 2022-04-17 MED ORDER — ALPRAZOLAM 0.5 MG PO TABS
0.5000 mg | ORAL_TABLET | Freq: Every evening | ORAL | 0 refills | Status: DC | PRN
  Filled 2022-04-17: qty 30, 30d supply, fill #0

## 2022-04-17 NOTE — Telephone Encounter (Signed)
Last ov 09/24/21

## 2022-04-17 NOTE — Telephone Encounter (Signed)
Looks like this was d/c by another  doctor. Last filled on 10/02/21 and last ov 09/24/21

## 2022-04-18 ENCOUNTER — Other Ambulatory Visit (HOSPITAL_COMMUNITY): Payer: Self-pay

## 2022-04-28 ENCOUNTER — Ambulatory Visit: Payer: 59 | Admitting: Family Medicine

## 2022-05-05 ENCOUNTER — Ambulatory Visit (INDEPENDENT_AMBULATORY_CARE_PROVIDER_SITE_OTHER)
Admission: RE | Admit: 2022-05-05 | Discharge: 2022-05-05 | Disposition: A | Discharge status: Home / Self Care | Payer: 59 | Source: Ambulatory Visit | Attending: Family | Admitting: Family

## 2022-05-05 ENCOUNTER — Other Ambulatory Visit (HOSPITAL_COMMUNITY): Payer: Self-pay

## 2022-05-05 ENCOUNTER — Ambulatory Visit: Payer: 59 | Admitting: Family

## 2022-05-05 ENCOUNTER — Encounter: Payer: Self-pay | Admitting: Family

## 2022-05-05 VITALS — BP 130/82 | HR 115 | Temp 99.2°F | Resp 16 | Ht 61.5 in | Wt 178.5 lb

## 2022-05-05 DIAGNOSIS — U071 COVID-19: Secondary | ICD-10-CM

## 2022-05-05 DIAGNOSIS — J40 Bronchitis, not specified as acute or chronic: Secondary | ICD-10-CM

## 2022-05-05 DIAGNOSIS — R0602 Shortness of breath: Secondary | ICD-10-CM

## 2022-05-05 DIAGNOSIS — R059 Cough, unspecified: Secondary | ICD-10-CM | POA: Diagnosis not present

## 2022-05-05 MED ORDER — METHYLPREDNISOLONE 4 MG PO TBPK
ORAL_TABLET | ORAL | 0 refills | Status: DC
  Filled 2022-05-05: qty 21, 6d supply, fill #0

## 2022-05-05 MED ORDER — DOXYCYCLINE HYCLATE 100 MG PO TABS
100.0000 mg | ORAL_TABLET | Freq: Two times a day (BID) | ORAL | 0 refills | Status: AC
  Filled 2022-05-05: qty 20, 10d supply, fill #0

## 2022-05-05 MED ORDER — ALBUTEROL SULFATE HFA 108 (90 BASE) MCG/ACT IN AERS
2.0000 | INHALATION_SPRAY | Freq: Four times a day (QID) | RESPIRATORY_TRACT | 0 refills | Status: DC | PRN
  Filled 2022-05-05: qty 6.7, 25d supply, fill #0

## 2022-05-05 NOTE — Assessment & Plan Note (Signed)
cxr today Albuterol prn If sudden worsening sob go to er and or call 911

## 2022-05-05 NOTE — Assessment & Plan Note (Signed)
rx doxycycline 100 mg po bid x 10 days rx medrol dose pack  cxr today to r/o pneumona, if + will add augmentin  Albuterol prn sob  Consider flovent  Continue otc guaifenesin without DM

## 2022-05-05 NOTE — Patient Instructions (Signed)
Advised of CDC guidelines for self isolation/ ending isolation.  Advised of safe practice guidelines. Symptom Tier reviewed.  Encouraged to monitor for any worsening symptoms; watch for increased shortness of breath, weakness, and signs of dehydration. Advised when to seek emergency care.  Instructed to rest and hydrate well.  Advised to leave the house during recommended isolation period, only if it is necessary to seek medical care  Due to recent changes in healthcare laws, you may see results of your imaging and/or laboratory studies on MyChart before I have had a chance to review them.  I understand that in some cases there may be results that are confusing or concerning to you. Please understand that not all results are received at the same time and often I may need to interpret multiple results in order to provide you with the best plan of care or course of treatment. Therefore, I ask that you please give me 2 business days to thoroughly review all your results before contacting my office for clarification. Should we see a critical lab result, you will be contacted sooner.   It was a pleasure seeing you today! Please do not hesitate to reach out with any questions and or concerns.  Regards,   Eugenia Pancoast FNP-C

## 2022-05-05 NOTE — Progress Notes (Signed)
Established Patient Office Visit  Subjective:  Patient ID: Jamie Coleman, female    DOB: 12/31/1970  Age: 51 y.o. MRN: [id number]  CC:  Chief Complaint  Patient presents with   Covid Positive    X 2 weeks    HPI Jamie Coleman is here today with concerns.   Tested positive for covid 04/23/22.  Prior to being diagnosed with covid she was already being treated for sinusitis, she states she was given seven days of augmentin. Helped her ears to stop popping but still with congestion, sinus pressure, mild chest congestion with some sob (she thinks due to her being tired), cough is clear and productive.   No sore throat.  Felt hot, but not sure if she had a fever.  Did have a fever two weeks ago, not since.   Elevated heart rate, recheck of temp is 99.9 F. Feels hot and diaphoretic.  Feels chest congestion, not chest pain. No palpitations.    Past Medical History:  Diagnosis Date   Allergy    allergic rhinitis   Anxiety    situational   COVID 05/2021   Difficult intubation    states 2012 ablation surgery, had diff intubation,glide scope used, not noted in EPIC anesthesia record   Elevated hemoglobin A1c 08/08/2015   Elevated liver function tests 08/08/2015   GERD (gastroesophageal reflux disease)    Hyperlipidemia    Low serum vitamin D 08/08/2015   Status post endometrial ablation    Tennis elbow    currently in PT-10/16    Past Surgical History:  Procedure Laterality Date   BIOPSY  02/09/2022   Procedure: BIOPSY;  Surgeon: Irving Copas., MD;  Location: [facility];  Service: Gastroenterology;;   CESAREAN SECTION     COLONOSCOPY WITH PROPOFOL N/A 02/09/2022   Procedure: COLONOSCOPY WITH PROPOFOL;  Surgeon: Irving Copas., MD;  Location: [facility];  Service: Gastroenterology;  Laterality: N/A;   ENDOMETRIAL ABLATION     8/12   ESOPHAGOGASTRODUODENOSCOPY (EGD) WITH PROPOFOL N/A 02/09/2022   Procedure: ESOPHAGOGASTRODUODENOSCOPY (EGD) WITH  PROPOFOL;  Surgeon: Rush Landmark Telford Nab., MD;  Location: [facility];  Service: Gastroenterology;  Laterality: N/A;   EXTREMITY WIRE/PIN REMOVAL Left 07/16/2016   Procedure: PIN REMOVAL OF LEFT WRIST X 2;  Surgeon: Roseanne Kaufman, MD;  Location: [place];  Service: Orthopedics;  Laterality: Left;   FOOT SURGERY Left    LAPAROSCOPIC TUBAL LIGATION  06/09/2011   Procedure: LAPAROSCOPIC TUBAL LIGATION;  Surgeon: Arloa Koh;  Location: [facility];  Service: Gynecology;  Laterality: N/A;   LIGAMENT REPAIR Left 05/08/2016   Procedure: LEFT WRIST SCAPHOLUNATE LIGAMENT RECONSTRUCTION WITH TENDON GRAFT PIN NEURECTOMY AND REPAIR;  Surgeon: Roseanne Kaufman, MD;  Location: [place];  Service: Orthopedics;  Laterality: Left;   POLYPECTOMY  02/09/2022   Procedure: POLYPECTOMY;  Surgeon: Mansouraty, Telford Nab., MD;  Location: [facility];  Service: Gastroenterology;;   TONSILLECTOMY     as child    Family History  Problem Relation Age of Onset   Cancer Mother        CA insitu of appendix   Heart disease Father        CAD   Diabetes Father        type II   Alzheimer's disease Father    Colon polyps Father    Diabetes Brother        type II   Diabetes Maternal Grandfather    Diabetes Paternal Grandmother  Diabetes Paternal Grandfather    Breast cancer Neg Hx    Colon cancer Neg Hx    Stomach cancer Neg Hx    Esophageal cancer Neg Hx    Pancreatic cancer Neg Hx     Social History   Socioeconomic History   Marital status: Married    Spouse name: Not on file   Number of children: 2   Years of education: Not on file   Highest education level: Not on file  Occupational History   Occupation: Nurse  Tobacco Use   Smoking status: Former    Types: Cigarettes    Quit date: 07/25/1983    Years since quitting: 38.8   Smokeless tobacco: Never   Tobacco comments:    in high school  Vaping Use   Vaping Use: Never used  Substance and Sexual Activity    Alcohol use: Yes    Alcohol/week: 14.0 standard drinks of alcohol    Types: 14 Glasses of wine per week    Comment: 6 shots daily 12 shots daily on weekends   Drug use: No   Sexual activity: Yes    Partners: Male    Birth control/protection: Surgical    Comment: Tubal ligation  Other Topics Concern   Not on file  Social History Narrative   Not on file   Social Determinants of Health   Financial Resource Strain: Not on file  Food Insecurity: Not on file  Transportation Needs: Not on file  Physical Activity: Not on file  Stress: Not on file  Social Connections: Not on file  Intimate Partner Violence: Not on file    Outpatient Medications Prior to Visit  Medication Sig Dispense Refill   ALPRAZolam (XANAX) 0.5 MG tablet Take 1 tablet by mouth at bedtime as needed for anxiety. 30 tablet 0   Ascorbic Acid (VITAMIN C) 1000 MG tablet Take 1,000 mg by mouth daily.     calcium carbonate (OSCAL) 1500 (600 Ca) MG TABS tablet Take 600 mg of elemental calcium by mouth daily with breakfast.     Cholecalciferol (VITAMIN D3) 5000 units TABS Take 2,000 Units by mouth every Monday, Wednesday, and Friday.     diltiazem 2 % GEL Apply 1 application topically 3 (three) times daily. 30 g 3   fexofenadine (ALLEGRA) 180 MG tablet Take 180 mg by mouth daily as needed for allergies.     Fish Oil-Cholecalciferol (FISH OIL-VITAMIN D) 1200-1000 MG-UNIT CAPS Take 1 capsule by mouth daily.     fluorometholone (FML LIQUIFILM) 0.1 % ophthalmic suspension Place 1 drop into both eyes once a day as needed 5 mL 1   ibuprofen (ADVIL,MOTRIN) 800 MG tablet Take 1 tablet (800 mg total) by mouth every 8 (eight) hours as needed (with food). 90 tablet 2   lidocaine (XYLOCAINE) 2 % jelly apply small amount topically to rectum 2-3 times a day 30 mL 0   Multiple Vitamin (MULTIVITAMIN) capsule Take 1 capsule by mouth daily.       omeprazole (PRILOSEC) 20 MG capsule Take 20 mg by mouth daily.     hydrocortisone (ANUSOL-HC) 2.5  % rectal cream Place 1 application rectally 2 (two) times daily. (Patient taking differently: Place 1 application  rectally 2 (two) times daily as needed for hemorrhoids or anal itching.) 30 g 1   hydrocortisone (ANUSOL-HC) 25 MG suppository Unwrap and insert 1 suppository rectally at bedtime. (Patient taking differently: Place 25 mg rectally at bedtime as needed for hemorrhoids.) 12 suppository 1   hydrocortisone (ANUSOL-HC)  25 MG suppository Place 1 suppository (25 mg total) rectally at bedtime as needed for hemorrhoids or anal itching. 12 suppository 1   magic mouthwash (lidocaine, diphenhydrAMINE, alum & mag hydroxide) suspension Swish and swallow 5 to 10 mLs every 6 hours as needed for mouth pain. (Patient not taking: Reported on 05/05/2022) 360 mL 0   escitalopram (LEXAPRO) 10 MG tablet Take 1 tablet by mouth daily. (Patient not taking: Reported on 05/05/2022) 30 tablet 5   magic mouthwash (multi-ingredient) oral suspension Swish and swallow 5 to 10 mls every 6 hours as needed by mouth for mouth pain. (Patient not taking: Reported on 05/05/2022) 240 mL 0   No facility-administered medications prior to visit.    Allergies  Allergen Reactions   Lipitor [Atorvastatin]     myalgias        Objective:    Physical Exam Constitutional:      General: She is not in acute distress.    Appearance: Normal appearance. She is obese. She is diaphoretic. She is not ill-appearing or toxic-appearing.     Comments: Warm to touch  HENT:     Right Ear: Tympanic membrane normal.     Left Ear: Tympanic membrane normal.     Nose: No congestion or rhinorrhea.     Right Turbinates: Not enlarged or swollen.     Left Turbinates: Not enlarged or swollen.     Right Sinus: Frontal sinus tenderness present. No maxillary sinus tenderness.     Left Sinus: Frontal sinus tenderness present. No maxillary sinus tenderness.     Mouth/Throat:     Mouth: Mucous membranes are moist.     Pharynx: No pharyngeal swelling,  oropharyngeal exudate or posterior oropharyngeal erythema.     Tonsils: No tonsillar exudate.  Eyes:     Extraocular Movements: Extraocular movements intact.     Conjunctiva/sclera: Conjunctivae normal.     Pupils: Pupils are equal, round, and reactive to light.  Neck:     Thyroid: No thyroid mass.  Cardiovascular:     Rate and Rhythm: Regular rhythm. Tachycardia present.  Pulmonary:     Effort: Pulmonary effort is normal.     Breath sounds: Normal breath sounds.  Lymphadenopathy:     Cervical:     Right cervical: No superficial cervical adenopathy.    Left cervical: No superficial cervical adenopathy.  Neurological:     General: No focal deficit present.     Mental Status: She is alert and oriented to person, place, and time. Mental status is at baseline.     BP 130/82   Pulse (!) 115   Temp 99.2 F (37.3 C)   Resp 16   Ht 5' 1.5" (1.562 m)   Wt 178 lb 8 oz (81 kg)   SpO2 98%   BMI 33.18 kg/m  Wt Readings from Last 3 Encounters:  05/05/22 178 lb 8 oz (81 kg)  12/04/21 172 lb (78 kg)  11/04/21 173 lb (78.5 kg)     Health Maintenance Due  Topic Date Due   Zoster Vaccines- Shingrix (1 of 2) Never done   TETANUS/TDAP  01/15/2015   COVID-19 Vaccine (2 - Pfizer risk series) 05/28/2021    There are no preventive care reminders to display for this patient.  Lab Results  Component Value Date   TSH 6.651 (H) 07/15/2020   Lab Results  Component Value Date   WBC 5.1 11/04/2021   HGB 12.8 11/04/2021   HCT 37.3 11/04/2021   MCV 101.4 (H) 11/04/2021  PLT 183.0 11/04/2021   Lab Results  Component Value Date   NA 138 11/04/2021   K 3.8 11/04/2021   CO2 26 11/04/2021   GLUCOSE 104 (H) 11/04/2021   BUN 9 11/04/2021   CREATININE 0.62 11/04/2021   BILITOT 1.2 11/04/2021   ALKPHOS 96 11/04/2021   AST 32 11/04/2021   ALT 17 11/04/2021   PROT 6.9 11/04/2021   ALBUMIN 3.9 11/04/2021   CALCIUM 9.1 11/04/2021   ANIONGAP 10 09/18/2021   GFR 102.73 11/04/2021   Lab  Results  Component Value Date   HGBA1C 5.6 09/22/2021      Assessment & Plan:   Problem List Items Addressed This Visit       Respiratory   Bronchitis due to COVID-19 virus    rx doxycycline 100 mg po bid x 10 days rx medrol dose pack  cxr today to r/o pneumona, if + will add augmentin  Albuterol prn sob  Consider flovent  Continue otc guaifenesin without DM        Relevant Medications   methylPREDNISolone (MEDROL DOSEPAK) 4 MG TBPK tablet     Other   Shortness of breath - Primary    cxr today Albuterol prn If sudden worsening sob go to er and or call 911      Relevant Medications   doxycycline (VIBRA-TABS) 100 MG tablet   methylPREDNISolone (MEDROL DOSEPAK) 4 MG TBPK tablet   albuterol (VENTOLIN HFA) 108 (90 Base) MCG/ACT inhaler   Other Relevant Orders   DG Chest 2 View (Completed)    Meds ordered this encounter  Medications   doxycycline (VIBRA-TABS) 100 MG tablet    Sig: Take 1 tablet by mouth 2 times daily for 10 days.    Dispense:  20 tablet    Refill:  0    Order Specific Question:   Supervising Provider    Answer:   BEDSOLE, AMY E [[id number]]   methylPREDNISolone (MEDROL DOSEPAK) 4 MG TBPK tablet    Sig: Take per package instructions    Dispense:  21 tablet    Refill:  0    Order Specific Question:   Supervising Provider    Answer:   BEDSOLE, AMY E [[id number]]   albuterol (VENTOLIN HFA) 108 (90 Base) MCG/ACT inhaler    Sig: Inhale 2 puffs into the lungs every 6 hours as needed for wheezing or shortness of breath.    Dispense:  6.7 g    Refill:  0    Order Specific Question:   Supervising Provider    Answer:   BEDSOLE, AMY E [[id number]]    Follow-up: No follow-ups on file.    Eugenia Pancoast, FNP

## 2022-07-22 ENCOUNTER — Ambulatory Visit (INDEPENDENT_AMBULATORY_CARE_PROVIDER_SITE_OTHER): Payer: 59 | Admitting: Obstetrics and Gynecology

## 2022-07-22 ENCOUNTER — Encounter: Payer: Self-pay | Admitting: Obstetrics and Gynecology

## 2022-07-22 VITALS — BP 118/80 | HR 98 | Ht 62.0 in | Wt 183.0 lb

## 2022-07-22 DIAGNOSIS — Z01419 Encounter for gynecological examination (general) (routine) without abnormal findings: Secondary | ICD-10-CM | POA: Diagnosis not present

## 2022-07-22 NOTE — Patient Instructions (Signed)

## 2022-07-22 NOTE — Progress Notes (Signed)
51 y.o. G39P1002 Married Caucasian female here for annual exam.    Has really rare spotting since her endometrial ablation.   Some hot flashes and night sweats.  Managing OK. Not seeking treatment.   States the little mass of the right breast is still present.  It was 4 mm and at the 8:00 position.  She had dx testing last year and is due again in December this year.   PCP:   Loura Pardon,  MD  No LMP recorded. Patient has had an ablation.           Sexually active: Yes.    The current method of family planning is ablation.    Exercising: Yes.    Home exercise routine includes walking 1 hrs per day. Smoker:  no  Health Maintenance: Pap:  07/16/2021 - negative, neg HR HPV. History of abnormal Pap:  yes MMG:  09/15/2021 - BI-RADS 3.  Dx bilateral mammogram and right breast US due December, 2023. Colonoscopy:  02/09/2022 -  polyps.  States she is due again in 10 years.  BMD:   n/a  Result  n/a TDaP:  2006.  States she passes out with injections. Gardasil:   no HIV:  neg in pregnancy Hep C: neg 09/18/21 Screening Labs:  PCP Flu vaccine:  completed.  She will do a Covid vaccine.    reports that she quit smoking about 39 years ago. Her smoking use included cigarettes. She has never used smokeless tobacco. She reports that she does not currently use alcohol after a past usage of about 14.0 standard drinks of alcohol per week. She reports that she does not use drugs.  Past Medical History:  Diagnosis Date   Allergy    allergic rhinitis   Anxiety    situational   COVID 05/2021   Difficult intubation    states 2012 ablation surgery, had diff intubation,glide scope used, not noted in EPIC anesthesia record   Elevated hemoglobin A1c 08/08/2015   Elevated liver function tests 08/08/2015   GERD (gastroesophageal reflux disease)    Hyperlipidemia    Low serum vitamin D 08/08/2015   Status post endometrial ablation    Tennis elbow    currently in PT-10/16    Past Surgical History:   Procedure Laterality Date   BIOPSY  02/09/2022   Procedure: BIOPSY;  Surgeon: Irving Copas., MD;  Location: [facility];  Service: Gastroenterology;;   CESAREAN SECTION     COLONOSCOPY WITH PROPOFOL N/A 02/09/2022   Procedure: COLONOSCOPY WITH PROPOFOL;  Surgeon: Irving Copas., MD;  Location: [facility];  Service: Gastroenterology;  Laterality: N/A;   ENDOMETRIAL ABLATION     8/12   ESOPHAGOGASTRODUODENOSCOPY (EGD) WITH PROPOFOL N/A 02/09/2022   Procedure: ESOPHAGOGASTRODUODENOSCOPY (EGD) WITH PROPOFOL;  Surgeon: Rush Landmark Telford Nab., MD;  Location: [facility];  Service: Gastroenterology;  Laterality: N/A;   EXTREMITY WIRE/PIN REMOVAL Left 07/16/2016   Procedure: PIN REMOVAL OF LEFT WRIST X 2;  Surgeon: Roseanne Kaufman, MD;  Location: [facility];  Service: Orthopedics;  Laterality: Left;   FOOT SURGERY Left    LAPAROSCOPIC TUBAL LIGATION  06/09/2011   Procedure: LAPAROSCOPIC TUBAL LIGATION;  Surgeon: Arloa Koh;  Location: [facility];  Service: Gynecology;  Laterality: N/A;   LIGAMENT REPAIR Left 05/08/2016   Procedure: LEFT WRIST SCAPHOLUNATE LIGAMENT RECONSTRUCTION WITH TENDON GRAFT PIN NEURECTOMY AND REPAIR;  Surgeon: Roseanne Kaufman, MD;  Location: [facility];  Service: Orthopedics;  Laterality: Left;   POLYPECTOMY  02/09/2022  Procedure: POLYPECTOMY;  Surgeon: Rush Landmark Telford Nab., MD;  Location: [facility];  Service: Gastroenterology;;   TONSILLECTOMY     as child    Current Outpatient Medications  Medication Sig Dispense Refill   ALPRAZolam (XANAX) 0.5 MG tablet Take 1 tablet by mouth at bedtime as needed for anxiety. 30 tablet 0   Ascorbic Acid (VITAMIN C) 1000 MG tablet Take 1,000 mg by mouth daily.     calcium carbonate (OSCAL) 1500 (600 Ca) MG TABS tablet Take 600 mg of elemental calcium by mouth daily with breakfast.     Cholecalciferol (VITAMIN D3) 5000 units TABS Take 2,000 Units by mouth every Monday, Wednesday, and  Friday.     diltiazem 2 % GEL Apply 1 application topically 3 (three) times daily. 30 g 3   fexofenadine (ALLEGRA) 180 MG tablet Take 180 mg by mouth daily as needed for allergies.     Fish Oil-Cholecalciferol (FISH OIL-VITAMIN D) 1200-1000 MG-UNIT CAPS Take 1 capsule by mouth daily.     fluorometholone (FML LIQUIFILM) 0.1 % ophthalmic suspension Place 1 drop into both eyes once a day as needed 5 mL 1   hydrocortisone (ANUSOL-HC) 25 MG suppository Place 1 suppository (25 mg total) rectally at bedtime as needed for hemorrhoids or anal itching. 12 suppository 1   ibuprofen (ADVIL,MOTRIN) 800 MG tablet Take 1 tablet (800 mg total) by mouth every 8 (eight) hours as needed (with food). 90 tablet 2   lidocaine (XYLOCAINE) 2 % jelly apply small amount topically to rectum 2-3 times a day 30 mL 0   magic mouthwash (lidocaine, diphenhydrAMINE, alum & mag hydroxide) suspension Swish and swallow 5 to 10 mLs every 6 hours as needed for mouth pain. 360 mL 0   methylPREDNISolone (MEDROL DOSEPAK) 4 MG TBPK tablet Take per package instructions 21 tablet 0   Multiple Vitamin (MULTIVITAMIN) capsule Take 1 capsule by mouth daily.       omeprazole (PRILOSEC) 20 MG capsule Take 20 mg by mouth daily.     No current facility-administered medications for this visit.    Family History  Problem Relation Age of Onset   Cancer Mother        CA insitu of appendix   Heart disease Father        CAD   Diabetes Father        type II   Alzheimer's disease Father    Colon polyps Father    Diabetes Brother        type II   Diabetes Maternal Grandfather    Diabetes Paternal Grandmother    Diabetes Paternal Grandfather    Breast cancer Neg Hx    Colon cancer Neg Hx    Stomach cancer Neg Hx    Esophageal cancer Neg Hx    Pancreatic cancer Neg Hx     Review of Systems  All other systems reviewed and are negative.   Exam:   BP 118/80 (BP Location: Right Arm, Patient Position: Sitting, Cuff Size: Normal)   Pulse 98    Ht '5\' 2"'$  (1.575 m)   Wt 183 lb (83 kg)   SpO2 97%   BMI 33.47 kg/m     General appearance: alert, cooperative and appears stated age Head: normocephalic, without obvious abnormality, atraumatic Neck: no adenopathy, supple, symmetrical, trachea midline and thyroid normal to inspection and palpation Lungs: clear to auscultation bilaterally Breasts: right - normal appearance,  4 mm mass at 8:00 at edge of areola, No nipple retraction or dimpling, No nipple  discharge or bleeding, No axillary adenopathy Left - normal appearance, no masses or tenderness, No nipple retraction or dimpling, No nipple discharge or bleeding, No axillary adenopathy Heart: regular rate and rhythm Abdomen: soft, non-tender; no masses, no organomegaly Extremities: extremities normal, atraumatic, no cyanosis or edema Skin: skin color, texture, turgor normal. No rashes or lesions Lymph nodes: cervical, supraclavicular, and axillary nodes normal. Neurologic: grossly normal  Pelvic: External genitalia:  no lesions              No abnormal inguinal nodes palpated.              Urethra:  normal appearing urethra with no masses, tenderness or lesions              Bartholins and Skenes: normal                 Vagina: normal appearing vagina with normal color and discharge, no lesions              Cervix: no lesions.  Light menstrual flow              Pap taken: no Bimanual Exam:  Uterus:  normal size, contour, position, consistency, mobility, non-tender              Adnexa: no mass, fullness, tenderness              Rectal exam: yes.  Confirms.              Anus:  normal sphincter tone, no lesions  Chaperone was present for exam:  Kimalexis, CMA  Assessment:   Well woman visit with gynecologic exam. Status post BTL and endometrial ablation.  Remote history of abnormal pap. Uncertain history of LEEP procedure.  Menopausal symptoms.  History of bilateral nipple discharge. Normal prolactin and TSH.  Hx elevated  prolactin many years ago.  Known right breast lump.  Stable.  Benign prior imaging.   Plan: Dx bilateral mammogram and right breast US due in December.  She knows to schedule this.  Self breast awareness reviewed. Pap and HR HPV not indicated.  Guidelines for Calcium, Vitamin D, regular exercise program including cardiovascular and weight bearing exercise. Labs with PCP.  Follow up annually and prn.   After visit summary provided.

## 2022-08-17 ENCOUNTER — Other Ambulatory Visit: Payer: Self-pay | Admitting: Family Medicine

## 2022-08-17 DIAGNOSIS — K7010 Alcoholic hepatitis without ascites: Secondary | ICD-10-CM

## 2022-08-17 MED ORDER — ALPRAZOLAM 0.5 MG PO TABS
0.5000 mg | ORAL_TABLET | Freq: Every evening | ORAL | 0 refills | Status: DC | PRN
  Filled 2022-08-17: qty 30, 30d supply, fill #0

## 2022-08-17 NOTE — Telephone Encounter (Signed)
Name of Medication: xanax Name of Pharmacy: [facility] Date and Quantity: 04/17/22 # 30 tab/ 0 refills Last Office Visit and Type: Tabitha, NP for Covid on 05/05/22 Next Office Visit and Type: none scheduled (last f/u with PCP was 09/24/21)

## 2022-08-18 ENCOUNTER — Other Ambulatory Visit (HOSPITAL_COMMUNITY): Payer: Self-pay

## 2022-08-18 ENCOUNTER — Other Ambulatory Visit (INDEPENDENT_AMBULATORY_CARE_PROVIDER_SITE_OTHER): Payer: 59

## 2022-08-18 ENCOUNTER — Other Ambulatory Visit: Payer: Self-pay

## 2022-08-18 DIAGNOSIS — K7010 Alcoholic hepatitis without ascites: Secondary | ICD-10-CM | POA: Diagnosis not present

## 2022-08-18 LAB — CBC WITH DIFFERENTIAL/PLATELET
Basophils Absolute: 0.1 10*3/uL (ref 0.0–0.1)
Basophils Relative: 1.3 % (ref 0.0–3.0)
Eosinophils Absolute: 0 10*3/uL (ref 0.0–0.7)
Eosinophils Relative: 0.2 % (ref 0.0–5.0)
HCT: 39.7 % (ref 36.0–46.0)
Hemoglobin: 13.8 g/dL (ref 12.0–15.0)
Lymphocytes Relative: 5.1 % — ABNORMAL LOW (ref 12.0–46.0)
Lymphs Abs: 0.4 10*3/uL — ABNORMAL LOW (ref 0.7–4.0)
MCHC: 34.8 g/dL (ref 30.0–36.0)
MCV: 104.5 fl — ABNORMAL HIGH (ref 78.0–100.0)
Monocytes Absolute: 0.9 10*3/uL (ref 0.1–1.0)
Monocytes Relative: 10.3 % (ref 3.0–12.0)
Neutro Abs: 7.1 10*3/uL (ref 1.4–7.7)
Neutrophils Relative %: 83.1 % — ABNORMAL HIGH (ref 43.0–77.0)
Platelets: 118 10*3/uL — ABNORMAL LOW (ref 150.0–400.0)
RBC: 3.8 Mil/uL — ABNORMAL LOW (ref 3.87–5.11)
RDW: 14.6 % (ref 11.5–15.5)
WBC: 8.6 10*3/uL (ref 4.0–10.5)

## 2022-08-18 LAB — COMPREHENSIVE METABOLIC PANEL
ALT: 48 U/L — ABNORMAL HIGH (ref 0–35)
AST: 176 U/L — ABNORMAL HIGH (ref 0–37)
Albumin: 3.4 g/dL — ABNORMAL LOW (ref 3.5–5.2)
Alkaline Phosphatase: 347 U/L — ABNORMAL HIGH (ref 39–117)
BUN: 3 mg/dL — ABNORMAL LOW (ref 6–23)
CO2: 28 mEq/L (ref 19–32)
Calcium: 8.9 mg/dL (ref 8.4–10.5)
Chloride: 93 mEq/L — ABNORMAL LOW (ref 96–112)
Creatinine, Ser: 0.61 mg/dL (ref 0.40–1.20)
GFR: 102.57 mL/min (ref >60.00)
Glucose, Bld: 145 mg/dL — ABNORMAL HIGH (ref 70–99)
Potassium: 3.2 mEq/L — ABNORMAL LOW (ref 3.5–5.1)
Sodium: 133 mEq/L — ABNORMAL LOW (ref 135–145)
Total Bilirubin: 8.4 mg/dL — ABNORMAL HIGH (ref 0.2–1.2)
Total Protein: 6.9 g/dL (ref 6.0–8.3)

## 2022-08-18 LAB — PROTIME-INR
INR: 2.3 ratio — ABNORMAL HIGH (ref 0.8–1.0)
Prothrombin Time: 23.6 s — ABNORMAL HIGH (ref 9.6–13.1)

## 2022-08-18 MED ORDER — PREDNISONE 20 MG PO TABS
40.0000 mg | ORAL_TABLET | Freq: Every day | ORAL | 0 refills | Status: AC
  Filled 2022-08-18: qty 6, 3d supply, fill #0

## 2022-08-18 MED ORDER — PREDNISOLONE SODIUM PHOSPHATE 15 MG/5ML PO SOLN
40.0000 mg | Freq: Every day | ORAL | 1 refills | Status: DC
  Filled 2022-08-18: qty 237, 17d supply, fill #0
  Filled 2022-08-19: qty 187, 14d supply, fill #0
  Filled 2022-08-20: qty 50, 3d supply, fill #0

## 2022-08-18 MED ORDER — PHYTONADIONE 5 MG PO TABS
10.0000 mg | ORAL_TABLET | Freq: Every day | ORAL | 0 refills | Status: AC
  Filled 2022-08-18: qty 6, 3d supply, fill #0

## 2022-08-18 NOTE — Telephone Encounter (Signed)
Do you want this pt to keep the appointment with Jaclyn Shaggy this week?

## 2022-08-18 NOTE — Telephone Encounter (Signed)
I called and spoke with the patient today. I have reviewed her laboratories.  Her bilirubin is 8.4 with an INR of 2.3.  Her Maddrey's discriminant function is 56.7. Patient has been drinking at least 6 beverages per day for at least the last 4 to 6 weeks. The last week she has noted some darkening of urine which was coming and going but since last Friday her eyes became more deeply jaundiced. She denies any fevers or chills or abdominal pain or nausea or vomiting. She has been trying to be more aggressive with her fluid intake and is drinking bone broth as well as electrolyte drinks. She has stopped drinking alcohol since Friday and is not experiencing any tremors or agitation or tremulousness or progressive tachycardia.  Here is the plan of action: 1) multivitamin daily 2) folic acid 1 g daily 3) thiamine 250 to 500 mg daily 4) steroids to initiate on Wednesday 4a) Prednisolone 40 mg daily is preference (pharmacy will need to see what cost is and ability to acquire) - we will send Rx for 2 week course with 1 refill 4b) Since I know that Prednisolone will not be available for at least the first few days, I am going to send in 3 days worth of Prednisone 40 mg daily, so that she can get started on treatment sooner 5) vitamin K 10 mg daily x3 days 6) we will try to add on an acute hepatitis panel to labs drawn today (will have RN Gerarda Fraction work on this tomorrow -if unable then when patient comes into clinic, she will need to give blood) 7) follow-up in clinic on Thursday 8) repeat CBC/CMP/INR on Friday and then again next Tuesday 9) calculate Lille score after follow-up labs 1 week after steroid initiation 10) 1-1.5 g/kg protein intake daily 11) MiraLAX once to twice daily as needed to have at least 2 bowel movements per day 12) abdominal ultrasound to be scheduled semiurgent 13) if patient develops any fevers or chills or severe fluid development or progressive changes in liver test, will need to  come into the hospital  Patient agrees with this plan of action.

## 2022-08-18 NOTE — Addendum Note (Signed)
Addended by: Justice Britain on: 08/18/2022 05:45 PM   Modules accepted: Orders

## 2022-08-19 ENCOUNTER — Other Ambulatory Visit: Payer: Self-pay

## 2022-08-19 ENCOUNTER — Other Ambulatory Visit: Payer: 59

## 2022-08-19 ENCOUNTER — Other Ambulatory Visit (HOSPITAL_COMMUNITY): Payer: Self-pay

## 2022-08-19 DIAGNOSIS — K7010 Alcoholic hepatitis without ascites: Secondary | ICD-10-CM

## 2022-08-19 NOTE — Telephone Encounter (Signed)
Lab add on faxed to the lab and order entered

## 2022-08-19 NOTE — Progress Notes (Unsigned)
08/19/2022 Jamie Coleman [id number] 02-24-1971   Chief Complaint:  History of Present Illness: Jamie Coleman is a 51 year old female with a past medical history of obesity, anxiety, alcohol use disorder, alcohol associated hepatitis, GERD, diverticulosis and rectosigmoid hyperplastic polyps.  She contacted our office 08/17/2022. She underwent laboratory studies which showed a bilirubin level of 8.4 with an IR of 2.3.  Maddrey's discriminant function score 56.7.  She reported drinking 6 alcohol beverages daily for the past 4 to 6 weeks.  She noticed darkening of her urine that was coming and going last week and noticed her eyes became deeply jaundiced on Friday, 08/14/2022.  No fever, nausea, vomiting or abdominal pain.  Last alcohol intake was on Friday 08/14/2022. She is not experiencing any tremors or agitation or tremulousness or progressive tachycardia.  Dr. Rush Landmark ordered the following:  1) multivitamin daily 2) folic acid 1 g daily 3) thiamine 250 to 500 mg daily 4) steroids to initiate on Wednesday 4a) Prednisolone 40 mg daily is preference (pharmacy will need to see what cost is and ability to acquire) - we will send Rx for 2 week course with 1 refill 4b) Since I know that Prednisolone will not be available for at least the first few days, I am going to send in 3 days worth of Prednisone 40 mg daily, so that she can get started on treatment sooner 5) vitamin K 10 mg daily x3 days 6) we will try to add on an acute hepatitis panel to labs drawn today (will have RN Gerarda Fraction work on this tomorrow -if unable then when patient comes into clinic, she will need to give blood) 7) follow-up in clinic on Thursday 8) repeat CBC/CMP/INR on Friday and then again next Tuesday 9) calculate Lille score after follow-up labs 1 week after steroid initiation 10) 1-1.5 g/kg protein intake daily 11) MiraLAX once to twice daily as needed to have at least 2 bowel movements per day 12)  abdominal ultrasound to be scheduled semiurgent 13) if patient develops any fevers or chills or severe fluid development or progressive changes in liver test, will need to come into the hospital  Patient agreed with this plan of action.  She presents today for follow-up as advised by Dr. Rush Landmark.  Labs 08/19/2022: Acute hepatitis panel  LABS 10/10/2021: Hepatitis A total antibody nonreactive.  Hepatitis B surface antibody reactive.  Hep B core total antibody nonreactive.  ANA negative. SMA < 20. AMA < 20. IgG 1,042.   LABS 09/18/2021: Hepatitis B surface antigen nonreactive.  Hep C antibody nonreactive.  HIV nonreactive.  IMAGE STUDIES:  RUQ sonogram 09/17/2021: CLINICAL DATA:  Painless jaundice   EXAM: ULTRASOUND ABDOMEN LIMITED RIGHT UPPER QUADRANT   FINDINGS: Gallbladder: No gallstones. Mild gallbladder wall thickening measuring up to 4 mm. No sonographic Murphy sign noted by sonographer.   Common bile duct: Diameter: 2 mm   Liver: No focal lesion identified. Hepatomegaly. Increased parenchymal echogenicity. Portal vein is patent on color Doppler imaging with normal direction of blood flow towards the liver.   IMPRESSION: 1. Mild gallbladder wall thickening measuring up to 4 mm. No cholelithiasis. Negative sonographic Murphy sign. 2. No biliary ductal dilatation. 3. Hepatomegaly and hepatic steatosis. 4. Consider CT or MRI to further evaluate otherwise unexplained jaundice.        She was admitted to the hospital 12/7 - 09/18/2021 due to having jaundice in the setting of active alcohol use.  Total bilirubin level 6.4.  INR 1.4  with mildly elevated LFTs    PAST GI PROCEDURES:  EGD 02/09/2022: - No gross lesions in esophagus. Biopsied. - Z-line irregular, 35 cm from the incisors. - 1 cm hiatal hernia. - Erythematous mucosa in the stomach. Biopsied. - No gross lesions in the duodenal bulb, in the first portion of the duodenum and in the second portion of the  duodenum.  Colonoscopy 02/09/2022: - Perianal skin tags found on perianal exam. - Hemorrhoids found on digital rectal exam. - The examined portion of the ileum was normal. - Diverticulosis in the sigmoid colon, in the descending colon and in the transverse colon. - Two 3 to 4 mm polyps in the rectum and at the recto-sigmoid colon, removed with a cold snare. Resected and retrieved. - Normal mucosa in the entire examined colon otherwise. - Non-bleeding non-thrombosed external and internal hemorrhoids.  A. STOMACH, BIOPSY:  Reactive gastropathy and mild chronic gastritis with lymphoid aggregates  Negative for H. pylori, intestinal metaplasia, dysplasia and carcinoma   B. ESOPHAGUS, BIOPSY:  Reactive squamous mucosa  Negative for glandular epithelium, eosinophils, dysplasia and carcinoma   C. COLON, RECTOSIGMOID, POLYPECTOMY:  Hyperplastic polyp  Negative for dysplasia and carcinoma   Past Medical History:  Diagnosis Date   Allergy    allergic rhinitis   Anxiety    situational   COVID 05/2021   Difficult intubation    states 2012 ablation surgery, had diff intubation,glide scope used, not noted in EPIC anesthesia record   Elevated hemoglobin A1c 08/08/2015   Elevated liver function tests 08/08/2015   GERD (gastroesophageal reflux disease)    Hyperlipidemia    Low serum vitamin D 08/08/2015   Status post endometrial ablation    Tennis elbow    currently in PT-10/16   Past Surgical History:  Procedure Laterality Date   BIOPSY  02/09/2022   Procedure: BIOPSY;  Surgeon: Irving Copas., MD;  Location: [facility];  Service: Gastroenterology;;   CESAREAN SECTION     COLONOSCOPY WITH PROPOFOL N/A 02/09/2022   Procedure: COLONOSCOPY WITH PROPOFOL;  Surgeon: Irving Copas., MD;  Location: [facility];  Service: Gastroenterology;  Laterality: N/A;   ENDOMETRIAL ABLATION     8/12   ESOPHAGOGASTRODUODENOSCOPY (EGD) WITH PROPOFOL N/A 02/09/2022   Procedure:  ESOPHAGOGASTRODUODENOSCOPY (EGD) WITH PROPOFOL;  Surgeon: Rush Landmark Telford Nab., MD;  Location: [facility];  Service: Gastroenterology;  Laterality: N/A;   EXTREMITY WIRE/PIN REMOVAL Left 07/16/2016   Procedure: PIN REMOVAL OF LEFT WRIST X 2;  Surgeon: Roseanne Kaufman, MD;  Location: [facility];  Service: Orthopedics;  Laterality: Left;   FOOT SURGERY Left    LAPAROSCOPIC TUBAL LIGATION  06/09/2011   Procedure: LAPAROSCOPIC TUBAL LIGATION;  Surgeon: Arloa Koh;  Location: [facility];  Service: Gynecology;  Laterality: N/A;   LIGAMENT REPAIR Left 05/08/2016   Procedure: LEFT WRIST SCAPHOLUNATE LIGAMENT RECONSTRUCTION WITH TENDON GRAFT PIN NEURECTOMY AND REPAIR;  Surgeon: Roseanne Kaufman, MD;  Location: [facility];  Service: Orthopedics;  Laterality: Left;   POLYPECTOMY  02/09/2022   Procedure: POLYPECTOMY;  Surgeon: Mansouraty, Telford Nab., MD;  Location: [facility];  Service: Gastroenterology;;   TONSILLECTOMY     as child       Current Medications, Allergies, Past Medical History, Past Surgical History, Family History and Social History were reviewed in [employer] record.   Review of Systems:   Constitutional: Negative for fever, sweats, chills or weight loss.  Respiratory: Negative for shortness of breath.   Cardiovascular: Negative for chest  pain, palpitations and leg swelling.  Gastrointestinal: See HPI.  Musculoskeletal: Negative for back pain or muscle aches.  Neurological: Negative for dizziness, headaches or paresthesias.    Physical Exam: There were no vitals taken for this visit. General: Well developed, w   ***female in no acute distress. Head: Normocephalic and atraumatic. Eyes: No scleral icterus. Conjunctiva pink . Ears: Normal auditory acuity. Mouth: Dentition intact. No ulcers or lesions.  Lungs: Clear throughout to auscultation. Heart: Regular rate and rhythm, no murmur. Abdomen: Soft, nontender and  nondistended. No masses or hepatomegaly. Normal bowel sounds x 4 quadrants.  Rectal: *** Musculoskeletal: Symmetrical with no gross deformities. Extremities: No edema. Neurological: Alert oriented x 4. No focal deficits.  Psychological: Alert and cooperative. Normal mood and affect  Assessment and Recommendations: ***

## 2022-08-20 ENCOUNTER — Encounter: Payer: Self-pay | Admitting: Nurse Practitioner

## 2022-08-20 ENCOUNTER — Other Ambulatory Visit (HOSPITAL_COMMUNITY): Payer: Self-pay

## 2022-08-20 ENCOUNTER — Ambulatory Visit (INDEPENDENT_AMBULATORY_CARE_PROVIDER_SITE_OTHER): Payer: 59 | Admitting: Nurse Practitioner

## 2022-08-20 VITALS — BP 126/70 | HR 106 | Ht 61.5 in | Wt 185.5 lb

## 2022-08-20 DIAGNOSIS — R1013 Epigastric pain: Secondary | ICD-10-CM

## 2022-08-20 DIAGNOSIS — K7010 Alcoholic hepatitis without ascites: Secondary | ICD-10-CM | POA: Diagnosis not present

## 2022-08-20 LAB — HEPATITIS PANEL, ACUTE
Hep A IgM: NONREACTIVE
Hep B C IgM: NONREACTIVE
Hepatitis B Surface Ag: NONREACTIVE
Hepatitis C Ab: NONREACTIVE

## 2022-08-20 MED ORDER — POTASSIUM CHLORIDE CRYS ER 20 MEQ PO TBCR
20.0000 meq | EXTENDED_RELEASE_TABLET | Freq: Every day | ORAL | 0 refills | Status: DC
  Filled 2022-08-20: qty 5, 5d supply, fill #0

## 2022-08-20 NOTE — Patient Instructions (Addendum)
You have been scheduled for an abdominal ultrasound at [facility] (1st floor of hospital) on 08/25/22 at 8 am. Please arrive 30 minutes prior to your appointment for registration. Make certain not to have anything to eat or drink after midnight prior to your appointment. Should you need to reschedule your appointment, please contact radiology at [phone]. This test typically takes about 30 minutes to perform.   Prednisolone '40mg'$  po once daily as prescribed by Dr. Rush Landmark   Continue Thiamine '500mg'$  one tab daily, Folic acid '1mg'$  tab once daily and a multivitamin once daily  KCL 71mq one tab daily x 5 days   Complete Vitamin K '10mg'$  one tab for a total of 3 days as previously prescribed   Go to our lab Friday 11/10 and Tuesday 11/14 to have a CBC, CMP and PT/INR level done   Go to the emergency room if you develop confusion or severe abdominal pain or excessive nose bleed or bloody/black stools occur   Complete alcohol abstinence recommended, continue follow up with therapist and consider outpatient alcohol rehab  Continue high protein diet 1.5 gm/kg daily   Follow up in office in 2 weeks.  It was a pleasure to see you today!  Thank you for trusting me with your gastrointestinal care!

## 2022-08-20 NOTE — Progress Notes (Signed)
Attending Physician's Attestation   I have reviewed the chart.   I agree with the Advanced Practitioner's note, impression, and recommendations with any updates as below.    Sola Margolis Mansouraty, MD [facility] Office # [phone]  

## 2022-08-21 ENCOUNTER — Other Ambulatory Visit: Payer: Self-pay

## 2022-08-21 ENCOUNTER — Telehealth: Payer: Self-pay | Admitting: Nurse Practitioner

## 2022-08-21 ENCOUNTER — Other Ambulatory Visit (HOSPITAL_COMMUNITY): Payer: Self-pay

## 2022-08-21 ENCOUNTER — Other Ambulatory Visit (INDEPENDENT_AMBULATORY_CARE_PROVIDER_SITE_OTHER): Payer: 59

## 2022-08-21 DIAGNOSIS — R1013 Epigastric pain: Secondary | ICD-10-CM

## 2022-08-21 DIAGNOSIS — K7010 Alcoholic hepatitis without ascites: Secondary | ICD-10-CM

## 2022-08-21 DIAGNOSIS — E876 Hypokalemia: Secondary | ICD-10-CM

## 2022-08-21 LAB — CBC WITH DIFFERENTIAL/PLATELET
Basophils Absolute: 0 10*3/uL (ref 0.0–0.1)
Basophils Relative: 0.4 % (ref 0.0–3.0)
Eosinophils Absolute: 0.1 10*3/uL (ref 0.0–0.7)
Eosinophils Relative: 0.7 % (ref 0.0–5.0)
HCT: 38 % (ref 36.0–46.0)
Hemoglobin: 13.3 g/dL (ref 12.0–15.0)
Lymphocytes Relative: 17.9 % (ref 12.0–46.0)
Lymphs Abs: 1.7 10*3/uL (ref 0.7–4.0)
MCHC: 34.9 g/dL (ref 30.0–36.0)
MCV: 105 fl — ABNORMAL HIGH (ref 78.0–100.0)
Monocytes Absolute: 1.2 10*3/uL — ABNORMAL HIGH (ref 0.1–1.0)
Monocytes Relative: 12.1 % — ABNORMAL HIGH (ref 3.0–12.0)
Neutro Abs: 6.7 10*3/uL (ref 1.4–7.7)
Neutrophils Relative %: 68.9 % (ref 43.0–77.0)
Platelets: 169 10*3/uL (ref 150.0–400.0)
RBC: 3.62 Mil/uL — ABNORMAL LOW (ref 3.87–5.11)
RDW: 15.1 % (ref 11.5–15.5)
WBC: 9.7 10*3/uL (ref 4.0–10.5)

## 2022-08-21 LAB — PROTIME-INR
INR: 2 ratio — ABNORMAL HIGH (ref 0.8–1.0)
Prothrombin Time: 20.7 s — ABNORMAL HIGH (ref 9.6–13.1)

## 2022-08-21 LAB — COMPREHENSIVE METABOLIC PANEL
ALT: 41 U/L — ABNORMAL HIGH (ref 0–35)
AST: 132 U/L — ABNORMAL HIGH (ref 0–37)
Albumin: 3.4 g/dL — ABNORMAL LOW (ref 3.5–5.2)
Alkaline Phosphatase: 300 U/L — ABNORMAL HIGH (ref 39–117)
BUN: 6 mg/dL (ref 6–23)
CO2: 31 mEq/L (ref 19–32)
Calcium: 8.9 mg/dL (ref 8.4–10.5)
Chloride: 90 mEq/L — ABNORMAL LOW (ref 96–112)
Creatinine, Ser: 0.76 mg/dL (ref 0.40–1.20)
GFR: 89.89 mL/min (ref >60.00)
Glucose, Bld: 129 mg/dL — ABNORMAL HIGH (ref 70–99)
Potassium: 2.7 mEq/L — CL (ref 3.5–5.1)
Sodium: 133 mEq/L — ABNORMAL LOW (ref 135–145)
Total Bilirubin: 10.2 mg/dL — ABNORMAL HIGH (ref 0.2–1.2)
Total Protein: 6.9 g/dL (ref 6.0–8.3)

## 2022-08-21 MED ORDER — POTASSIUM CHLORIDE CRYS ER 20 MEQ PO TBCR
20.0000 meq | EXTENDED_RELEASE_TABLET | Freq: Every day | ORAL | 0 refills | Status: DC
  Filled 2022-08-21 – 2022-08-22 (×2): qty 3, 3d supply, fill #0

## 2022-08-21 NOTE — Telephone Encounter (Signed)
CKS, agree with all of your thoughts. If she is not having any other issues, with her INR being stable/better, I am relieved. In regards to slight rise of bilirubin, we will just have to wait to see the Lille score next week. Hopefully, she is able to increase her K as your have outlined and maintain it. Thanks. GM

## 2022-08-21 NOTE — Telephone Encounter (Signed)
Dr. Rush Landmark and Carl Best NP Pt.   Pt was notified and made aware of recent labs and Carl Best NP recommendations   Pt stated that she is feeling  ok and denied confusion, tachycardia, fever, any further epistaxis or abd pain: Notified to go to the ED if she has any of these symptoms:  Pt notified to take her potassium as follows: take KCL 61mq po today and tomorrow then back to KCL 247m once daily: Prescription refill KCL RX so she has enough until she has repeat labs done on Tues 11/14. Pt was made aware:   Pt stated that she is having some insomnia due to the Prednisone that  she has been taking and was wondering if something can be prescribed to assist with that.   Please review labs and previous note as DOD

## 2022-08-21 NOTE — Telephone Encounter (Signed)
Jamie Coleman, patient with alcohol associated hepatitis seen in clinic yesterday.  Dr. Rush Landmark and I are off work today. Can you please have the doc of the day review her labs which show her T. Bilirubin level is rising and INR has decreased. She is on Prednisone '40mg'$  QD with plans to transition to Prednisolone '40mg'$  QD today as her pharmacy was able to obtain Prednisolone. Yesterday's clinic note explains in full detail.   Please obtain sx update, verify if she has any confusion, tachycardia, fever, any further epistaxis or abd pain. If she has any of these symptoms she needs to go to the ED.  Her potassium level is lower, I prescribed KCL 72mq po QD x 5 days yesterday. Please have her take KCL 430m po today and tomorrow then back to KCL 2040monce daily. Pls refill KCL RX so she has enough until she has repeat labs done on Tues 11/14.   Thank you

## 2022-08-22 ENCOUNTER — Other Ambulatory Visit (HOSPITAL_COMMUNITY): Payer: Self-pay

## 2022-08-25 ENCOUNTER — Ambulatory Visit (HOSPITAL_COMMUNITY)
Admission: RE | Admit: 2022-08-25 | Discharge: 2022-08-25 | Disposition: A | Discharge status: Home / Self Care | Payer: 59 | Source: Ambulatory Visit | Attending: Nurse Practitioner | Admitting: Nurse Practitioner

## 2022-08-25 ENCOUNTER — Other Ambulatory Visit (INDEPENDENT_AMBULATORY_CARE_PROVIDER_SITE_OTHER): Payer: 59

## 2022-08-25 ENCOUNTER — Other Ambulatory Visit (HOSPITAL_COMMUNITY): Payer: Self-pay

## 2022-08-25 DIAGNOSIS — R1013 Epigastric pain: Secondary | ICD-10-CM

## 2022-08-25 DIAGNOSIS — K7010 Alcoholic hepatitis without ascites: Secondary | ICD-10-CM | POA: Insufficient documentation

## 2022-08-25 DIAGNOSIS — K7460 Unspecified cirrhosis of liver: Secondary | ICD-10-CM | POA: Diagnosis not present

## 2022-08-25 DIAGNOSIS — K6389 Other specified diseases of intestine: Secondary | ICD-10-CM | POA: Diagnosis not present

## 2022-08-25 LAB — CBC WITH DIFFERENTIAL/PLATELET
Basophils Relative: 0 % (ref 0.0–3.0)
Eosinophils Relative: 0 % (ref 0.0–5.0)
HCT: 37.8 % (ref 36.0–46.0)
Hemoglobin: 13.1 g/dL (ref 12.0–15.0)
Lymphocytes Relative: 12 % (ref 12.0–46.0)
MCHC: 34.7 g/dL (ref 30.0–36.0)
MCV: 104.9 fl — ABNORMAL HIGH (ref 78.0–100.0)
Monocytes Relative: 9 % (ref 3.0–12.0)
Neutrophils Relative %: 79 % — ABNORMAL HIGH (ref 43.0–77.0)
Platelets: 214 10*3/uL (ref 150.0–400.0)
RBC: 3.6 Mil/uL — ABNORMAL LOW (ref 3.87–5.11)
RDW: 16 % — ABNORMAL HIGH (ref 11.5–15.5)
WBC: 10.5 10*3/uL (ref 4.0–10.5)

## 2022-08-25 LAB — COMPREHENSIVE METABOLIC PANEL
ALT: 35 U/L (ref 0–35)
AST: 101 U/L — ABNORMAL HIGH (ref 0–37)
Albumin: 3.1 g/dL — ABNORMAL LOW (ref 3.5–5.2)
Alkaline Phosphatase: 237 U/L — ABNORMAL HIGH (ref 39–117)
BUN: 8 mg/dL (ref 6–23)
CO2: 27 mEq/L (ref 19–32)
Calcium: 8.4 mg/dL (ref 8.4–10.5)
Chloride: 95 mEq/L — ABNORMAL LOW (ref 96–112)
Creatinine, Ser: 0.6 mg/dL (ref 0.40–1.20)
GFR: 102.96 mL/min (ref >60.00)
Glucose, Bld: 166 mg/dL — ABNORMAL HIGH (ref 70–99)
Potassium: 3.4 mEq/L — ABNORMAL LOW (ref 3.5–5.1)
Sodium: 132 mEq/L — ABNORMAL LOW (ref 135–145)
Total Bilirubin: 12.5 mg/dL — ABNORMAL HIGH (ref 0.2–1.2)
Total Protein: 6.4 g/dL (ref 6.0–8.3)

## 2022-08-25 LAB — PROTIME-INR
INR: 2 ratio — ABNORMAL HIGH (ref 0.8–1.0)
Prothrombin Time: 21 s — ABNORMAL HIGH (ref 9.6–13.1)

## 2022-08-25 NOTE — Progress Notes (Signed)
I called and spoke with the patient today. She is doing and feeling well though had noted some increasing jaundice in the last few days and was concerned about that.  She is eating a high-protein diet currently.  She is tolerating exercise and is actually moving anywhere between 2 and 4 miles per day.  I told her to cut back on that and focus on good healthy meals, good protein intake, good hydration, and doing no more than 1 to 2 miles per day of walking.  She is not touching any alcohol. The patient's Lille score was checked by NP [facility] and it is 0.350 which suggests a good prognosis and a 40-monthsurvival of at least 85%. Although the bilirubin has increased, this can lag behind as the other liver test look to be improving. Our plan will be to continue prednisolone for a full 30 days.  She will need refills to be sent to the pharmacy. I also want for her potassium for her to take 20 mill equivalents potassium chloride for 1 week. We will plan to recheck a CBC/CMP/INR in 1 week, earlier if the patient is having any other issues. Patient agrees with this plan of action.   GJustice Britain MD [facility] # [phone]

## 2022-08-26 ENCOUNTER — Other Ambulatory Visit: Payer: Self-pay

## 2022-08-26 ENCOUNTER — Other Ambulatory Visit (HOSPITAL_COMMUNITY): Payer: Self-pay

## 2022-08-26 DIAGNOSIS — K7010 Alcoholic hepatitis without ascites: Secondary | ICD-10-CM

## 2022-08-26 DIAGNOSIS — R1013 Epigastric pain: Secondary | ICD-10-CM

## 2022-08-26 DIAGNOSIS — E876 Hypokalemia: Secondary | ICD-10-CM

## 2022-08-26 MED ORDER — PREDNISOLONE SODIUM PHOSPHATE 15 MG/5ML PO SOLN
40.0000 mg | Freq: Every day | ORAL | 3 refills | Status: DC
  Filled 2022-08-26 (×2): qty 399, 29d supply, fill #0

## 2022-08-26 MED ORDER — POTASSIUM CHLORIDE CRYS ER 20 MEQ PO TBCR
20.0000 meq | EXTENDED_RELEASE_TABLET | Freq: Every day | ORAL | 0 refills | Status: DC
  Filled 2022-08-26: qty 7, 7d supply, fill #0

## 2022-08-28 ENCOUNTER — Other Ambulatory Visit (HOSPITAL_COMMUNITY): Payer: Self-pay

## 2022-08-28 ENCOUNTER — Encounter: Payer: Self-pay | Admitting: Family Medicine

## 2022-08-28 MED ORDER — LORAZEPAM 0.5 MG PO TABS
0.5000 mg | ORAL_TABLET | Freq: Two times a day (BID) | ORAL | 0 refills | Status: DC | PRN
  Filled 2022-08-28: qty 10, 5d supply, fill #0

## 2022-08-28 MED ORDER — ESCITALOPRAM OXALATE 10 MG PO TABS
10.0000 mg | ORAL_TABLET | Freq: Every day | ORAL | 1 refills | Status: DC
  Filled 2022-08-28: qty 30, 30d supply, fill #0

## 2022-08-28 NOTE — Telephone Encounter (Signed)
Patient has been scheduled

## 2022-08-28 NOTE — Telephone Encounter (Signed)
Please call pt and schedule an appt to discuss treatment of anxiety - Dr Rush Landmark reached out to me and it think it is a good idea to discuss and make a plan

## 2022-08-29 ENCOUNTER — Other Ambulatory Visit (HOSPITAL_COMMUNITY): Payer: Self-pay

## 2022-08-31 ENCOUNTER — Encounter: Payer: Self-pay | Admitting: Family Medicine

## 2022-08-31 ENCOUNTER — Ambulatory Visit (INDEPENDENT_AMBULATORY_CARE_PROVIDER_SITE_OTHER): Payer: 59 | Admitting: Family Medicine

## 2022-08-31 VITALS — BP 136/62 | HR 92 | Temp 98.2°F | Ht 61.5 in | Wt 194.1 lb

## 2022-08-31 DIAGNOSIS — K7010 Alcoholic hepatitis without ascites: Secondary | ICD-10-CM

## 2022-08-31 DIAGNOSIS — F413 Other mixed anxiety disorders: Secondary | ICD-10-CM

## 2022-08-31 NOTE — Assessment & Plan Note (Signed)
This became worse before a family wedding, then pt started drinking again  No longer drinking in setting of liver dz and is more anxious (taking 40 mg of prednisolone daily for 28 days)  Reviewed stressors/ coping techniques/symptoms/ support sources/ tx options and side effects in detail today Strongly enc her to consider starting back on lexapro to keep her baseline level better  Reviewed stressors/ coping techniques/symptoms/ support sources/ tx options and side effects in detail today  Will stay at 10 mg in light of liver dz Discussed expectations of SSRI medication including time to effectiveness and mechanism of action, also poss of side effects (early and late)- including mental fuzziness, weight or appetite change, nausea and poss of worse dep or anxiety (even suicidal thoughts)  Pt voiced understanding and will stop med and update if this occurs    For prn use and sleep pt will need benzo to get through the next 2 weeks Has ativan to see if it helps sleep more than xanax Will try 1 mg tonight and report back tomorrow If not helpful clonazepam may be option  Enc self care Continue counseling (not interested in alcohol treatment currently but may be later) Would like to start couples counseling as well  No SI today- ER precautions noted

## 2022-08-31 NOTE — Progress Notes (Signed)
Subjective:    Patient ID: Jamie Coleman, female    DOB: 09/01/1971, 51 y.o.   MRN: [id number]  HPI Pt presents to discuss anxiety in the setting of liver disease   Wt Readings from Last 3 Encounters:  08/31/22 194 lb 2 oz (88.1 kg)  08/20/22 185 lb 8 oz (84.1 kg)  07/22/22 183 lb (83 kg)   36.09 kg/m   Anxiety  Did start lexapro 10 mg back    Quit drinking from nov 2022 to sept 2023  Then started back 4-5 drinks per day - (stressed about wedding)  Last drink was 2 weeks Not craving  Did not go through any withdrawal      Liver function worsened  Saw GI Started on prednisolone 40 mg and vit K  Watching bili and INR  Very stressed over this  She is scared  Her GI gave her bad news   More labs coming up tomorrow   It thinking about some specialized outpt alcohol therapist/ treatment  Wants to wait to get through this first   Was doing better overall until then (now on prednisone)  40 mg  Makes her anxious and also not sleep (daily for 28 days)   She is seeing therapist (her husband is also)  Her on line therapist is from [place] saw her husband's therapist with him in person Waiting for him to be ready to do more    Her husband never stopped drinking when she did and it makes things really hard for her  He enables her   Is working on all of this She is worried about him also    Librarian, academic (works longer than the xanax)  Can handle one -not 2 (made her fall in the hospital)       Anxiety is worse in afternoon when she sits down and starts thinking     Lab Results  Component Value Date   CREATININE 0.60 08/25/2022   BUN 8 08/25/2022   NA 132 (L) 08/25/2022   K 3.4 (L) 08/25/2022   CL 95 (L) 08/25/2022   CO2 27 08/25/2022   Lab Results  Component Value Date   ALT 35 08/25/2022   AST 101 (H) 08/25/2022   ALKPHOS 237 (H) 08/25/2022   BILITOT 12.5 (H) 08/25/2022   Lab Results  Component Value Date   WBC 10.5 08/25/2022    HGB 13.1 08/25/2022   HCT 37.8 08/25/2022   MCV 104.9 (H) 08/25/2022   PLT 214.0 08/25/2022   Lab Results  Component Value Date   INR 2.0 (H) 08/25/2022   INR 2.0 (H) 08/21/2022   INR 2.3 (H) 08/18/2022   [place] abd  MPRESSION: 1. Cirrhotic morphology of the liver. No focal hepatic lesion. 2. Gallbladder is contracted with mild wall thickening, nonspecific, potentially secondary to cirrhosis. Consider clinical and laboratory correlation. No cholelithiasis. 3. Mixed directional flow demonstrated within the portal vein, which is poorly evaluated.  Very tired  Poor exercise tolerance  Abd is less swollen   Patient Active Problem List   Diagnosis Date Noted   Shortness of breath 05/05/2022   Bronchitis due to COVID-19 virus 05/05/2022   Alcohol dependence ([place]) 62/95/2841   Alcoholic hepatitis 32/44/0102   Alcohol withdrawal syndrome without complication (HCC)    Elevated liver transaminase level 09/03/2019   Elevated TSH 09/03/2019   Vitamin D deficiency 03/02/2018   Fatigue 03/02/2018   Pupil asymmetry 08/04/2016   Hemorrhoids 12/28/2014   Prediabetes 08/28/2014  Tremor 06/30/2013   Sebaceous cyst 02/15/2013   OTHER&UNSPECIFIED DISEASES THE ORAL SOFT TISSUES 07/25/2008   DERMATOFIBROMA, ARM 11/11/2007   NEOPLASM, SKIN, UNCERTAIN BEHAVIOR 79/39/0300   Hyperlipidemia 02/10/2007   Anxiety disorder 02/10/2007   ALLERGIC RHINITIS 02/10/2007   GERD 02/10/2007   IBS 02/10/2007   Past Medical History:  Diagnosis Date   Allergy    allergic rhinitis   Anxiety    situational   COVID 05/2021   Difficult intubation    states 2012 ablation surgery, had diff intubation,glide scope used, not noted in EPIC anesthesia record   Elevated hemoglobin A1c 08/08/2015   Elevated liver function tests 08/08/2015   GERD (gastroesophageal reflux disease)    Hyperlipidemia    Low serum vitamin D 08/08/2015   Status post endometrial ablation    Tennis elbow    currently in PT-10/16    Past Surgical History:  Procedure Laterality Date   BIOPSY  02/09/2022   Procedure: BIOPSY;  Surgeon: Irving Copas., MD;  Location: [facility];  Service: Gastroenterology;;   CESAREAN SECTION     COLONOSCOPY WITH PROPOFOL N/A 02/09/2022   Procedure: COLONOSCOPY WITH PROPOFOL;  Surgeon: Irving Copas., MD;  Location: [facility];  Service: Gastroenterology;  Laterality: N/A;   ENDOMETRIAL ABLATION     8/12   ESOPHAGOGASTRODUODENOSCOPY (EGD) WITH PROPOFOL N/A 02/09/2022   Procedure: ESOPHAGOGASTRODUODENOSCOPY (EGD) WITH PROPOFOL;  Surgeon: Rush Landmark Telford Nab., MD;  Location: [facility];  Service: Gastroenterology;  Laterality: N/A;   EXTREMITY WIRE/PIN REMOVAL Left 07/16/2016   Procedure: PIN REMOVAL OF LEFT WRIST X 2;  Surgeon: Roseanne Kaufman, MD;  Location: [facility];  Service: Orthopedics;  Laterality: Left;   FOOT SURGERY Left    LAPAROSCOPIC TUBAL LIGATION  06/09/2011   Procedure: LAPAROSCOPIC TUBAL LIGATION;  Surgeon: Arloa Koh;  Location: [facility];  Service: Gynecology;  Laterality: N/A;   LIGAMENT REPAIR Left 05/08/2016   Procedure: LEFT WRIST SCAPHOLUNATE LIGAMENT RECONSTRUCTION WITH TENDON GRAFT PIN NEURECTOMY AND REPAIR;  Surgeon: Roseanne Kaufman, MD;  Location: [facility];  Service: Orthopedics;  Laterality: Left;   POLYPECTOMY  02/09/2022   Procedure: POLYPECTOMY;  Surgeon: Mansouraty, Telford Nab., MD;  Location: [facility];  Service: Gastroenterology;;   TONSILLECTOMY     as child   Social History   Tobacco Use   Smoking status: Former    Types: Cigarettes    Quit date: 07/25/1983    Years since quitting: 39.1   Smokeless tobacco: Never   Tobacco comments:    in high school  Vaping Use   Vaping Use: Never used  Substance Use Topics   Alcohol use: Not Currently    Alcohol/week: 14.0 standard drinks of alcohol    Types: 14 Glasses of wine per week    Comment: 6 shots daily 12 shots daily on weekends   Drug  use: No   Family History  Problem Relation Age of Onset   Cancer Mother        CA insitu of appendix   Heart disease Father        CAD   Diabetes Father        type II   Alzheimer's disease Father    Colon polyps Father    Diabetes Brother        type II   Diabetes Maternal Grandfather    Diabetes Paternal Grandmother    Diabetes Paternal Grandfather    Breast cancer Neg Hx    Colon cancer Neg Hx  Stomach cancer Neg Hx    Esophageal cancer Neg Hx    Pancreatic cancer Neg Hx    Allergies  Allergen Reactions   Lipitor [Atorvastatin]     myalgias   Current Outpatient Medications on File Prior to Visit  Medication Sig Dispense Refill   Ascorbic Acid (VITAMIN C) 1000 MG tablet Take 1,000 mg by mouth daily.     calcium carbonate (OSCAL) 1500 (600 Ca) MG TABS tablet Take 600 mg of elemental calcium by mouth daily with breakfast.     Cholecalciferol (VITAMIN D3) 5000 units TABS Take 2,000 Units by mouth every Monday, Wednesday, and Friday.     diltiazem 2 % GEL Apply 1 application topically 3 (three) times daily. 30 g 3   escitalopram (LEXAPRO) 10 MG tablet Take 1 tablet (10 mg total) by mouth daily. 30 tablet 1   fexofenadine (ALLEGRA) 180 MG tablet Take 180 mg by mouth daily as needed for allergies.     Fish Oil-Cholecalciferol (FISH OIL-VITAMIN D) 1200-1000 MG-UNIT CAPS Take 1 capsule by mouth daily.     hydrocortisone (ANUSOL-HC) 25 MG suppository Place 1 suppository (25 mg total) rectally at bedtime as needed for hemorrhoids or anal itching. 12 suppository 1   ibuprofen (ADVIL,MOTRIN) 800 MG tablet Take 1 tablet (800 mg total) by mouth every 8 (eight) hours as needed (with food). 90 tablet 2   lidocaine (XYLOCAINE) 2 % jelly apply small amount topically to rectum 2-3 times a day 30 mL 0   LORazepam (ATIVAN) 0.5 MG tablet Take 1 tablet (0.5 mg total) by mouth 2 (two) times daily as needed for anxiety (severe anxiety). 10 tablet 0   Multiple Vitamin (MULTIVITAMIN) capsule Take 1  capsule by mouth daily.       omeprazole (PRILOSEC) 20 MG capsule Take 20 mg by mouth daily.     potassium chloride SA (KLOR-CON M) 20 MEQ tablet Take 1 tablet (20 mEq total) by mouth daily for 7 doses. 7 tablet 0   prednisoLONE (ORAPRED) 15 MG/5ML solution Take 13.3 mLs (40 mg total) by mouth daily before breakfast. 399 mL 3   No current facility-administered medications on file prior to visit.    Review of Systems  Constitutional:  Negative for activity change, appetite change, fatigue, fever and unexpected weight change.  HENT:  Negative for congestion, ear pain, rhinorrhea, sinus pressure and sore throat.   Eyes:  Negative for pain, redness and visual disturbance.  Respiratory:  Negative for cough, shortness of breath and wheezing.   Cardiovascular:  Positive for leg swelling. Negative for chest pain and palpitations.       Mild puffy ankles  Gastrointestinal:  Negative for abdominal pain, blood in stool, constipation and diarrhea.       Abdomen swelling is improved   No longer nauseated   Endocrine: Negative for polydipsia and polyuria.  Genitourinary:  Negative for dysuria, frequency and urgency.  Musculoskeletal:  Negative for arthralgias, back pain and myalgias.  Skin:  Negative for pallor and rash.  Allergic/Immunologic: Negative for environmental allergies.  Neurological:  Negative for dizziness, syncope and headaches.  Hematological:  Negative for adenopathy. Does not bruise/bleed easily.  Psychiatric/Behavioral:  Negative for decreased concentration. The patient is nervous/anxious.        Objective:   Physical Exam Constitutional:      General: She is not in acute distress.    Appearance: Normal appearance. She is well-developed. She is obese. She is not ill-appearing or diaphoretic.  HENT:  Head: Normocephalic and atraumatic.  Eyes:     General: Scleral icterus present.     Conjunctiva/sclera: Conjunctivae normal.     Pupils: Pupils are equal, round, and  reactive to light.  Neck:     Thyroid: No thyromegaly.     Vascular: No carotid bruit or JVD.  Cardiovascular:     Rate and Rhythm: Regular rhythm. Tachycardia present.     Heart sounds: Normal heart sounds.     No gallop.  Pulmonary:     Effort: Pulmonary effort is normal. No respiratory distress.     Breath sounds: Normal breath sounds. No stridor. No wheezing or rales.  Abdominal:     General: Abdomen is protuberant. There is no distension or abdominal bruit.     Palpations: Abdomen is soft.  Musculoskeletal:     Cervical back: Normal range of motion and neck supple.     Right lower leg: No edema.     Left lower leg: No edema.  Lymphadenopathy:     Cervical: No cervical adenopathy.  Skin:    General: Skin is warm and dry.     Coloration: Skin is jaundiced. Skin is not pale.     Findings: No rash.  Neurological:     Mental Status: She is alert.     Coordination: Coordination normal.     Deep Tendon Reflexes: Reflexes are normal and symmetric. Reflexes normal.  Psychiatric:        Attention and Perception: Attention normal.        Mood and Affect: Mood is anxious.        Speech: Speech normal.        Behavior: Behavior normal.        Cognition and Memory: Cognition and memory normal.     Comments: Candidly discusses symptoms and stressors             Assessment & Plan:   Problem List Items Addressed This Visit       Digestive   Alcoholic hepatitis    Pt drank again in sept but stopped 2 wk ago Under GI care and taking prednisolone  Here to disc control of anxiety in order to prevent relapse again   Reviewed GI notes and abd [place] report and labs today  For lab tomorrow  Is still jaundiced today        Other   Anxiety disorder - Primary    This became worse before a family wedding, then pt started drinking again  No longer drinking in setting of liver dz and is more anxious (taking 40 mg of prednisolone daily for 28 days)  Reviewed stressors/ coping  techniques/symptoms/ support sources/ tx options and side effects in detail today Strongly enc her to consider starting back on lexapro to keep her baseline level better  Reviewed stressors/ coping techniques/symptoms/ support sources/ tx options and side effects in detail today  Will stay at 10 mg in light of liver dz Discussed expectations of SSRI medication including time to effectiveness and mechanism of action, also poss of side effects (early and late)- including mental fuzziness, weight or appetite change, nausea and poss of worse dep or anxiety (even suicidal thoughts)  Pt voiced understanding and will stop med and update if this occurs    For prn use and sleep pt will need benzo to get through the next 2 weeks Has ativan to see if it helps sleep more than xanax Will try 1 mg tonight and report back tomorrow If not  helpful clonazepam may be option  Enc self care Continue counseling (not interested in alcohol treatment currently but may be later) Would like to start couples counseling as well  No SI today- ER precautions noted

## 2022-08-31 NOTE — Assessment & Plan Note (Addendum)
Pt drank again in sept but stopped 2 wk ago Under GI care and taking prednisolone  Here to disc control of anxiety in order to prevent relapse again   Reviewed GI notes and abd [place] report and labs today  For lab tomorrow  Is still jaundiced today

## 2022-08-31 NOTE — Patient Instructions (Addendum)
Try 2 of the 0.5 ativan tonight and see how it goes   I do want you to start the lexapro 10 mg daily to get it in your system  If you feel worse or have side effects hold it and let me know   Continue your counseling and liver care  Continue to abstain from exercise  Keep drinking water  A little exercise may help as tolerated    Take care of yourself

## 2022-09-01 ENCOUNTER — Telehealth: Payer: Self-pay | Admitting: *Deleted

## 2022-09-01 ENCOUNTER — Ambulatory Visit (INDEPENDENT_AMBULATORY_CARE_PROVIDER_SITE_OTHER): Payer: 59 | Admitting: Nurse Practitioner

## 2022-09-01 ENCOUNTER — Other Ambulatory Visit: Payer: Self-pay

## 2022-09-01 ENCOUNTER — Encounter: Payer: Self-pay | Admitting: Nurse Practitioner

## 2022-09-01 ENCOUNTER — Other Ambulatory Visit (INDEPENDENT_AMBULATORY_CARE_PROVIDER_SITE_OTHER): Payer: 59

## 2022-09-01 ENCOUNTER — Inpatient Hospital Stay (HOSPITAL_COMMUNITY)
Admission: EM | Admit: 2022-09-01 | Discharge: 2022-09-07 | DRG: 433 | Disposition: A | Discharge status: Home / Self Care | Payer: 59 | Attending: Internal Medicine | Admitting: Internal Medicine

## 2022-09-01 ENCOUNTER — Emergency Department (HOSPITAL_COMMUNITY): Payer: 59

## 2022-09-01 ENCOUNTER — Other Ambulatory Visit (HOSPITAL_COMMUNITY): Payer: Self-pay

## 2022-09-01 ENCOUNTER — Encounter: Payer: Self-pay | Admitting: Family Medicine

## 2022-09-01 VITALS — BP 100/60 | HR 93 | Ht 61.5 in | Wt 194.0 lb

## 2022-09-01 DIAGNOSIS — K7031 Alcoholic cirrhosis of liver with ascites: Secondary | ICD-10-CM | POA: Diagnosis present

## 2022-09-01 DIAGNOSIS — R188 Other ascites: Secondary | ICD-10-CM | POA: Diagnosis not present

## 2022-09-01 DIAGNOSIS — K7011 Alcoholic hepatitis with ascites: Secondary | ICD-10-CM | POA: Diagnosis present

## 2022-09-01 DIAGNOSIS — F419 Anxiety disorder, unspecified: Secondary | ICD-10-CM | POA: Diagnosis present

## 2022-09-01 DIAGNOSIS — Z7952 Long term (current) use of systemic steroids: Secondary | ICD-10-CM

## 2022-09-01 DIAGNOSIS — D689 Coagulation defect, unspecified: Secondary | ICD-10-CM | POA: Diagnosis not present

## 2022-09-01 DIAGNOSIS — R601 Generalized edema: Secondary | ICD-10-CM | POA: Diagnosis not present

## 2022-09-01 DIAGNOSIS — K644 Residual hemorrhoidal skin tags: Secondary | ICD-10-CM | POA: Diagnosis present

## 2022-09-01 DIAGNOSIS — K7200 Acute and subacute hepatic failure without coma: Secondary | ICD-10-CM | POA: Diagnosis present

## 2022-09-01 DIAGNOSIS — K7030 Alcoholic cirrhosis of liver without ascites: Secondary | ICD-10-CM

## 2022-09-01 DIAGNOSIS — E876 Hypokalemia: Secondary | ICD-10-CM | POA: Diagnosis present

## 2022-09-01 DIAGNOSIS — Z8616 Personal history of COVID-19: Secondary | ICD-10-CM | POA: Diagnosis not present

## 2022-09-01 DIAGNOSIS — E8770 Fluid overload, unspecified: Secondary | ICD-10-CM | POA: Diagnosis present

## 2022-09-01 DIAGNOSIS — I864 Gastric varices: Secondary | ICD-10-CM | POA: Diagnosis present

## 2022-09-01 DIAGNOSIS — D7589 Other specified diseases of blood and blood-forming organs: Secondary | ICD-10-CM | POA: Diagnosis present

## 2022-09-01 DIAGNOSIS — E8779 Other fluid overload: Secondary | ICD-10-CM | POA: Diagnosis not present

## 2022-09-01 DIAGNOSIS — R1013 Epigastric pain: Secondary | ICD-10-CM

## 2022-09-01 DIAGNOSIS — K7040 Alcoholic hepatic failure without coma: Secondary | ICD-10-CM | POA: Diagnosis not present

## 2022-09-01 DIAGNOSIS — Z8249 Family history of ischemic heart disease and other diseases of the circulatory system: Secondary | ICD-10-CM | POA: Diagnosis not present

## 2022-09-01 DIAGNOSIS — E669 Obesity, unspecified: Secondary | ICD-10-CM | POA: Diagnosis present

## 2022-09-01 DIAGNOSIS — E785 Hyperlipidemia, unspecified: Secondary | ICD-10-CM | POA: Diagnosis present

## 2022-09-01 DIAGNOSIS — K7469 Other cirrhosis of liver: Secondary | ICD-10-CM | POA: Diagnosis not present

## 2022-09-01 DIAGNOSIS — M7989 Other specified soft tissue disorders: Secondary | ICD-10-CM | POA: Diagnosis not present

## 2022-09-01 DIAGNOSIS — R600 Localized edema: Secondary | ICD-10-CM | POA: Diagnosis not present

## 2022-09-01 DIAGNOSIS — R609 Edema, unspecified: Secondary | ICD-10-CM | POA: Diagnosis not present

## 2022-09-01 DIAGNOSIS — K8689 Other specified diseases of pancreas: Secondary | ICD-10-CM | POA: Diagnosis not present

## 2022-09-01 DIAGNOSIS — E038 Other specified hypothyroidism: Secondary | ICD-10-CM | POA: Diagnosis not present

## 2022-09-01 DIAGNOSIS — K7010 Alcoholic hepatitis without ascites: Secondary | ICD-10-CM | POA: Diagnosis present

## 2022-09-01 DIAGNOSIS — Z79899 Other long term (current) drug therapy: Secondary | ICD-10-CM

## 2022-09-01 DIAGNOSIS — Z8719 Personal history of other diseases of the digestive system: Secondary | ICD-10-CM

## 2022-09-01 DIAGNOSIS — K648 Other hemorrhoids: Secondary | ICD-10-CM | POA: Diagnosis present

## 2022-09-01 DIAGNOSIS — J811 Chronic pulmonary edema: Secondary | ICD-10-CM | POA: Diagnosis not present

## 2022-09-01 DIAGNOSIS — R17 Unspecified jaundice: Principal | ICD-10-CM

## 2022-09-01 DIAGNOSIS — Z87891 Personal history of nicotine dependence: Secondary | ICD-10-CM

## 2022-09-01 DIAGNOSIS — R748 Abnormal levels of other serum enzymes: Secondary | ICD-10-CM

## 2022-09-01 DIAGNOSIS — F1010 Alcohol abuse, uncomplicated: Secondary | ICD-10-CM | POA: Diagnosis not present

## 2022-09-01 DIAGNOSIS — F1011 Alcohol abuse, in remission: Secondary | ICD-10-CM | POA: Diagnosis not present

## 2022-09-01 DIAGNOSIS — R161 Splenomegaly, not elsewhere classified: Secondary | ICD-10-CM | POA: Diagnosis present

## 2022-09-01 DIAGNOSIS — K219 Gastro-esophageal reflux disease without esophagitis: Secondary | ICD-10-CM | POA: Diagnosis present

## 2022-09-01 DIAGNOSIS — D696 Thrombocytopenia, unspecified: Secondary | ICD-10-CM | POA: Diagnosis not present

## 2022-09-01 DIAGNOSIS — K449 Diaphragmatic hernia without obstruction or gangrene: Secondary | ICD-10-CM | POA: Diagnosis present

## 2022-09-01 DIAGNOSIS — K766 Portal hypertension: Secondary | ICD-10-CM | POA: Diagnosis not present

## 2022-09-01 DIAGNOSIS — K709 Alcoholic liver disease, unspecified: Secondary | ICD-10-CM | POA: Diagnosis not present

## 2022-09-01 DIAGNOSIS — K7290 Hepatic failure, unspecified without coma: Secondary | ICD-10-CM | POA: Diagnosis present

## 2022-09-01 DIAGNOSIS — Z6835 Body mass index (BMI) 35.0-35.9, adult: Secondary | ICD-10-CM

## 2022-09-01 DIAGNOSIS — K921 Melena: Secondary | ICD-10-CM | POA: Diagnosis not present

## 2022-09-01 DIAGNOSIS — F413 Other mixed anxiety disorders: Secondary | ICD-10-CM | POA: Diagnosis not present

## 2022-09-01 DIAGNOSIS — K7460 Unspecified cirrhosis of liver: Secondary | ICD-10-CM | POA: Diagnosis not present

## 2022-09-01 DIAGNOSIS — Z888 Allergy status to other drugs, medicaments and biological substances status: Secondary | ICD-10-CM

## 2022-09-01 DIAGNOSIS — R7303 Prediabetes: Secondary | ICD-10-CM | POA: Diagnosis present

## 2022-09-01 DIAGNOSIS — R6881 Early satiety: Secondary | ICD-10-CM | POA: Diagnosis present

## 2022-09-01 LAB — CBC WITH DIFFERENTIAL/PLATELET
Abs Immature Granulocytes: 0.2 10*3/uL — ABNORMAL HIGH (ref 0.00–0.07)
Basophils Absolute: 0.1 10*3/uL (ref 0.0–0.1)
Basophils Absolute: 0.1 10*3/uL (ref 0.0–0.1)
Basophils Relative: 0.8 % (ref 0.0–3.0)
Basophils Relative: 0 %
Eosinophils Absolute: 0 10*3/uL (ref 0.0–0.7)
Eosinophils Absolute: 0 10*3/uL (ref 0.0–0.5)
Eosinophils Relative: 0.1 % (ref 0.0–5.0)
Eosinophils Relative: 0 %
HCT: 37.5 % (ref 36.0–46.0)
HCT: 38.8 % (ref 36.0–46.0)
Hemoglobin: 12.9 g/dL (ref 12.0–15.0)
Hemoglobin: 13.8 g/dL (ref 12.0–15.0)
Immature Granulocytes: 1 %
Lymphocytes Relative: 21.4 % (ref 12.0–46.0)
Lymphocytes Relative: 5 %
Lymphs Abs: 3.6 10*3/uL (ref 0.7–4.0)
Lymphs Abs: 1 10*3/uL (ref 0.7–4.0)
MCH: 37 pg — ABNORMAL HIGH (ref 26.0–34.0)
MCHC: 34.5 g/dL (ref 30.0–36.0)
MCHC: 35.6 g/dL (ref 30.0–36.0)
MCV: 104.1 fl — ABNORMAL HIGH (ref 78.0–100.0)
MCV: 104 fL — ABNORMAL HIGH (ref 80.0–100.0)
Monocytes Absolute: 0.7 10*3/uL (ref 0.1–1.0)
Monocytes Absolute: 1.2 10*3/uL — ABNORMAL HIGH (ref 0.1–1.0)
Monocytes Relative: 4.2 % (ref 3.0–12.0)
Monocytes Relative: 6 %
Neutro Abs: 12.5 10*3/uL — ABNORMAL HIGH (ref 1.4–7.7)
Neutro Abs: 19 10*3/uL — ABNORMAL HIGH (ref 1.7–7.7)
Neutrophils Relative %: 73.5 % (ref 43.0–77.0)
Neutrophils Relative %: 88 %
Platelets: 257 10*3/uL (ref 150.0–400.0)
Platelets: 279 10*3/uL (ref 150–400)
RBC: 3.6 Mil/uL — ABNORMAL LOW (ref 3.87–5.11)
RBC: 3.73 MIL/uL — ABNORMAL LOW (ref 3.87–5.11)
RDW: 16.4 % — ABNORMAL HIGH (ref 11.5–15.5)
RDW: 18.1 % — ABNORMAL HIGH (ref 11.5–15.5)
WBC: 17 10*3/uL — ABNORMAL HIGH (ref 4.0–10.5)
WBC: 21.5 10*3/uL — ABNORMAL HIGH (ref 4.0–10.5)
nRBC: 0 % (ref 0.0–0.2)

## 2022-09-01 LAB — RAPID URINE DRUG SCREEN, HOSP PERFORMED
Amphetamines: NOT DETECTED
Barbiturates: NOT DETECTED
Benzodiazepines: POSITIVE — AB
Cocaine: NOT DETECTED
Opiates: NOT DETECTED
Tetrahydrocannabinol: NOT DETECTED

## 2022-09-01 LAB — PROTIME-INR
INR: 2.6 ratio — ABNORMAL HIGH (ref 0.8–1.0)
INR: 1.7 — ABNORMAL HIGH (ref 0.8–1.2)
Prothrombin Time: 26.7 s — ABNORMAL HIGH (ref 9.6–13.1)
Prothrombin Time: 20 seconds — ABNORMAL HIGH (ref 11.4–15.2)

## 2022-09-01 LAB — COMPREHENSIVE METABOLIC PANEL
ALT: 62 U/L — ABNORMAL HIGH (ref 0–35)
ALT: 73 U/L — ABNORMAL HIGH (ref 0–44)
AST: 110 U/L — ABNORMAL HIGH (ref 0–37)
AST: 125 U/L — ABNORMAL HIGH (ref 15–41)
Albumin: 2.9 g/dL — ABNORMAL LOW (ref 3.5–5.2)
Albumin: 2.5 g/dL — ABNORMAL LOW (ref 3.5–5.0)
Alkaline Phosphatase: 206 U/L — ABNORMAL HIGH (ref 39–117)
Alkaline Phosphatase: 211 U/L — ABNORMAL HIGH (ref 38–126)
Anion gap: 9 (ref 5–15)
BUN: 17 mg/dL (ref 6–23)
BUN: 16 mg/dL (ref 6–20)
CO2: 23 mEq/L (ref 19–32)
CO2: 25 mmol/L (ref 22–32)
Calcium: 8 mg/dL — ABNORMAL LOW (ref 8.4–10.5)
Calcium: 8.6 mg/dL — ABNORMAL LOW (ref 8.9–10.3)
Chloride: 98 mEq/L (ref 96–112)
Chloride: 100 mmol/L (ref 98–111)
Creatinine, Ser: 0.67 mg/dL (ref 0.40–1.20)
Creatinine, Ser: 0.46 mg/dL (ref 0.44–1.00)
GFR, Estimated: >60 mL/min (ref >60)
GFR: 100.25 mL/min (ref >60.00)
Glucose, Bld: 168 mg/dL — ABNORMAL HIGH (ref 70–99)
Glucose, Bld: 130 mg/dL — ABNORMAL HIGH (ref 70–99)
Potassium: 4.6 mEq/L (ref 3.5–5.1)
Potassium: 4.2 mmol/L (ref 3.5–5.1)
Sodium: 128 mEq/L — ABNORMAL LOW (ref 135–145)
Sodium: 134 mmol/L — ABNORMAL LOW (ref 135–145)
Total Bilirubin: 16.8 mg/dL — ABNORMAL HIGH (ref 0.2–1.2)
Total Bilirubin: 19.6 mg/dL (ref 0.3–1.2)
Total Protein: 5.9 g/dL — ABNORMAL LOW (ref 6.0–8.3)
Total Protein: 7.1 g/dL (ref 6.5–8.1)

## 2022-09-01 LAB — AMMONIA: Ammonia: 29 umol/L (ref 9–35)

## 2022-09-01 LAB — LIPID PANEL
Cholesterol: 266 mg/dL — ABNORMAL HIGH (ref 0–200)
HDL: <10 mg/dL — ABNORMAL LOW (ref >40)
Triglycerides: 370 mg/dL — ABNORMAL HIGH (ref <150)
VLDL: 74 mg/dL — ABNORMAL HIGH (ref 0–40)

## 2022-09-01 LAB — IRON AND TIBC
Iron: 79 ug/dL (ref 28–170)
Saturation Ratios: 49 % — ABNORMAL HIGH (ref 10.4–31.8)
TIBC: 160 ug/dL — ABNORMAL LOW (ref 250–450)
UIBC: 81 ug/dL

## 2022-09-01 LAB — HEPATIC FUNCTION PANEL
ALT: 62 U/L — ABNORMAL HIGH (ref 0–35)
AST: 110 U/L — ABNORMAL HIGH (ref 0–37)
Albumin: 2.9 g/dL — ABNORMAL LOW (ref 3.5–5.2)
Alkaline Phosphatase: 206 U/L — ABNORMAL HIGH (ref 39–117)
Bilirubin, Direct: 13.8 mg/dL — ABNORMAL HIGH (ref 0.0–0.3)
Total Bilirubin: 16.8 mg/dL — ABNORMAL HIGH (ref 0.2–1.2)
Total Protein: 5.9 g/dL — ABNORMAL LOW (ref 6.0–8.3)

## 2022-09-01 LAB — FERRITIN: Ferritin: 299 ng/mL (ref 11–307)

## 2022-09-01 LAB — FIBRINOGEN: Fibrinogen: 436 mg/dL (ref 210–475)

## 2022-09-01 LAB — LIPASE, BLOOD: Lipase: 76 U/L — ABNORMAL HIGH (ref 11–51)

## 2022-09-01 LAB — ACETAMINOPHEN LEVEL: Acetaminophen (Tylenol), Serum: <10 ug/mL — ABNORMAL LOW (ref 10–30)

## 2022-09-01 LAB — TSH: TSH: 6.162 u[IU]/mL — ABNORMAL HIGH (ref 0.350–4.500)

## 2022-09-01 LAB — CK: Total CK: 36 U/L — ABNORMAL LOW (ref 38–234)

## 2022-09-01 MED ORDER — VITAMIN K1 10 MG/ML IJ SOLN
10.0000 mg | Freq: Once | INTRAVENOUS | Status: AC
  Administered 2022-09-01: 10 mg via INTRAVENOUS
  Filled 2022-09-01: qty 1

## 2022-09-01 MED ORDER — ALBUMIN HUMAN 25 % IV SOLN
25.0000 g | Freq: Once | INTRAVENOUS | Status: AC
  Administered 2022-09-01: 25 g via INTRAVENOUS
  Filled 2022-09-01: qty 100

## 2022-09-01 MED ORDER — IOHEXOL 300 MG/ML  SOLN
100.0000 mL | Freq: Once | INTRAMUSCULAR | Status: AC | PRN
  Administered 2022-09-01: 100 mL via INTRAVENOUS

## 2022-09-01 MED ORDER — LORAZEPAM 1 MG PO TABS
0.5000 mg | ORAL_TABLET | Freq: Two times a day (BID) | ORAL | 1 refills | Status: DC | PRN
  Filled 2022-09-01: qty 30, 15d supply, fill #0

## 2022-09-01 NOTE — Telephone Encounter (Signed)
Pt was contacted for symptom update: Pt stated that Carl Best NP had a cancellation today at 1:30 PM and she was previously scheduled for that:

## 2022-09-01 NOTE — Progress Notes (Signed)
Attending Physician's Attestation   I have reviewed the chart.   I agree with the Advanced Practitioner's note, impression, and recommendations with any updates as below. Patient's clinical status has progressed unfortunately even with appropriate steroid therapy.  Her MELD score is approximately 33 at this time.  She has thankfully remained abstinent of all alcohol and is now working with a counselor in effort of trying to remain abstinent which is key.  Her platelets are normal and her imaging has suggested no overt splenomegaly but there has been some liver echotexture changes.  I do think that this patient likely has severe alcoholic hepatitis, but with the progression of her liver biochemical testing, acute on chronic liver disease may also still be playing some role.  At this point she needs inpatient evaluation and treatment.  I will begin discussions with the quaternary centers to see whether she may be a potential candidate for transplant evaluation.  Although many centers in the past had previously denied patients who had alcoholic otitis for liver transplantation, in certain situations, with certain patients, and overall social situations for that individual, alcoholic hepatitis may be transplanted with safety.  Based on her leukocytosis we will need to rule out infectious etiologies, but I am okay with continuing her steroid therapy for now.  I think cross-sectional imaging makes sense to ensure that there is no evidence of any portal venous thrombosis with the recent ultrasound that did not show appropriate flow within the liver system.  Additional work-up and consideration of N-acetylcysteine will need to be considered in an effort of trying to support this patient with the hope that further decompensation does not develop an most importantly that she does not develop encephalopathy that could be problematic.  She may need to initiate lactulose therapy.  I will take care of her when she is in the  hospital as I will be the inpatient GI provider at [facility] long this rest of the holiday week.  Her overall clinical status is grave/critical.  If she further has decompensation and she is a nontransplantable candidate, risk of 33-monthmortality is upwards of 55%.   GJustice Britain MD [facility] # [phone]

## 2022-09-01 NOTE — ED Triage Notes (Signed)
Pt states she is in liver failure. States she had labs drawn this morning and was told to come to the hospital for admission. Pt reports nose bleeds, bleeding hemorrhoids, yellow skin.

## 2022-09-01 NOTE — ED Notes (Signed)
This RN called lab to add on lab work and the urine culture.

## 2022-09-01 NOTE — Progress Notes (Signed)
Patient will be formally evaluated on 11/22 by the inpatient GI service in the setting of acute liver dysfunction in the setting of alcoholic hepatitis.  I have discussed this patient's case with [facility] as well as [facility].  [facility] has denied desire of evaluating this patient for liver transplantation, should it be needed.  [facility] transplant hepatology is willing to consider possible evaluation pending the patient's clinical course and ruling out infectious etiologies and other etiologies for her decompensation, as long as patient has adequate insurance.  The patient is in significant acute liver insufficiency with progressive INR elevation although mentation looks to be well.  The following is the initial work-up that should be performed by the inpatient medical service as the patient comes into the hospital:  Please obtain a chest x-ray Please obtain an urinalysis with urine culture Please obtain blood cultures x2. Please obtain a CT abdomen/pelvis with IV and oral contrast to better define the portal vasculature Please obtain a fibrinogen level Please give IV vitamin K 10 mg x 1 dose Please obtain a CPK level Please order a TTE to be completed tomorrow Please obtain a TSH Please obtain a Urine Toxin screen Please obtain Alpha-1-antitrypsin Ab Please obtain Ceruloplasmin Please obtain Iron, TIBC, Ferritin Please obtain lipid panel Initiate IV NAC tomorrow pending the laboratories tomorrow (little data but little downside)   Will need to consider, if a liver transplant evaluation candidate CMV IgM & IgG, Varicella Ab, RPR, Factor V level, Quantiferon Gold, AFP, Vitamin D, Room Air ABG, Dobutamine Stress TTE, PFTs, Nutrition Consultation    Justice Britain, MD [facility] Advanced Endoscopy Office # [phone]

## 2022-09-01 NOTE — Telephone Encounter (Signed)
I sent it to your pharmacy  Hope this helps Use with caution

## 2022-09-01 NOTE — Progress Notes (Signed)
09/01/2022 Jamie Coleman [id number] 09/05/1971   Chief Complaint: Alcohol associated hepatitis   History of Present Illness: Jamie Coleman is a 51 year old female with a past medical history of obesity, anxiety, alcohol use disorder, alcohol associated hepatitis, GERD, diverticulosis and rectosigmoid hyperplastic polyps.   As previously reviewed at the time of her last office visit 08/20/2022, Cuma noticed her eyes turned yellow on Saturday 08/15/2022 and Sunday 08/16/2022 she was notably jaundiced. Urine was darker in color.  She reported drinking 6 shots of vodka daily x 6 weeks. Last alcohol intake was on Saturday 08/15/2022. No signs of confusion or alcohol withdrawal. She contacted Dr. Rush Landmark and she was sent to our lab on Tues 08/18/2022. See results below.   Labs 08/18/2022: K+ 3.2. BUN 3. Cr. .61. T. Bili 8.4. Alk phos 347. AST 176. ALT 48.  INR 2.3.  WBC 8.6. Hg 13.8. MCV 104.5. PLT 118. Acute hepatitis panel negative. Maddrey's discriminant function score 56.7.   She was started on Prednisone 79m po QD on 08/18/2022 as her pharmacy had to order Prednisolone. She switched to Prednisolone 478mpo QD on 11/9. She was also started on a vitamin K 10 mg once daily x3 days, multivitamin daily, folic acid 79m69maily, thiamine 500 mg daily and KCL 20 meq QD.   Labs 08/21/2022: Total bili 10.2.  Alk phos 300.  AST 132.  ALT 41.  INR 2.0. Labs 08/25/2022: Total bili 12.5.  Alk phos 237. AST  101.  ALT 35.  INR 2.0.  Lille Score day # 7 0.350 (good prognosis).   Labs 09/01/2022: Total bili 16.8.  Direct bili 13.8.  Alk phos 206.  AST 110.  ALT 62.  Albumin 2.9.  INR 2.6.  Sodium 128.  Potassium 4.6.  Glucose 168.  BUN 17.  Creatinine 0.67.  WBC 17 (on Prednisolone).  Hemoglobin 12.9.  Hematocrit 37.5.  MCV 104.1.  Platelet 257. MELD Na score 31.  Complete abdominal ultrasound 08/25/2022: Showed cirrhosis without evidence of a hepatoma with mixed directional flow of the portal vein  which was poorly evaluated and the gallbladder was contracted with mild wall thickening which was nonspecific and potentially secondary to cirrhosis.  She presented to our front desk earlier this morning after she had the above lab drawn with complaints of worsening edema.  She was scheduled for an urgent appointment at this time.  DonMarnienies having any nausea or vomiting. She has mild right flank pain when she coughs otherwise no abdominal pain. She reports having 3 heavy nose bleeds last night. She had moderate bright red rectal bleeding which she attributed to having hemorrhoids for the past 3 days, less rectal bleeding today. She is passing brown formed stools. No black stools. Urine is normal yellow. No confusion or tremors. She remains abstinent from alcohol since 08/15/2022. She started seeing a therapist to assist with alcohol cessation.  No chest pain or shortness of breath.  IMAGE STUDIES:  [facility] Sonogram 09/17/2021: CLINICAL DATA:  Painless jaundice   EXAM: ULTRASOUND ABDOMEN LIMITED RIGHT UPPER QUADRANT   FINDINGS: Gallbladder: No gallstones. Mild gallbladder wall thickening measuring up to 4 mm. No sonographic Murphy sign noted by sonographer.   Common bile duct: Diameter: 2 mm   Liver: No focal lesion identified. Hepatomegaly. Increased parenchymal echogenicity. Portal vein is patent on color Doppler imaging with normal direction of blood flow towards the liver.   IMPRESSION: 1. Mild gallbladder wall thickening measuring up to 4 mm. No cholelithiasis.  Negative sonographic Murphy sign. 2. No biliary ductal dilatation. 3. Hepatomegaly and hepatic steatosis. 4. Consider CT or MRI to further evaluate otherwise unexplained jaundice.   PAST GI PROCEDURES:   EGD 02/09/2022: - No gross lesions in esophagus. Biopsied. - Z-line irregular, 35 cm from the incisors. - 1 cm hiatal hernia. - Erythematous mucosa in the stomach. Biopsied. - No gross lesions in the duodenal bulb, in  the first portion of the duodenum and in the second portion of the duodenum.   Colonoscopy 02/09/2022: - Perianal skin tags found on perianal exam. - Hemorrhoids found on digital rectal exam. - The examined portion of the ileum was normal. - Diverticulosis in the sigmoid colon, in the descending colon and in the transverse colon. - Two 3 to 4 mm polyps in the rectum and at the recto-sigmoid colon, removed with a cold snare. Resected and retrieved. - Normal mucosa in the entire examined colon otherwise. - Non-bleeding non-thrombosed external and internal hemorrhoids.   A. STOMACH, BIOPSY:  Reactive gastropathy and mild chronic gastritis with lymphoid aggregates  Negative for H. pylori, intestinal metaplasia, dysplasia and carcinoma   B. ESOPHAGUS, BIOPSY:  Reactive squamous mucosa  Negative for glandular epithelium, eosinophils, dysplasia and carcinoma   C. COLON, RECTOSIGMOID, POLYPECTOMY:  Hyperplastic polyp  Negative for dysplasia and carcinoma    Current Outpatient Medications on File Prior to Visit  Medication Sig Dispense Refill   Ascorbic Acid (VITAMIN C) 1000 MG tablet Take 1,000 mg by mouth daily.     calcium carbonate (OSCAL) 1500 (600 Ca) MG TABS tablet Take 600 mg of elemental calcium by mouth daily with breakfast.     Cholecalciferol (VITAMIN D3) 5000 units TABS Take 2,000 Units by mouth every Monday, Wednesday, and Friday.     diltiazem 2 % GEL Apply 1 application topically 3 (three) times daily. 30 g 3   escitalopram (LEXAPRO) 10 MG tablet Take 1 tablet (10 mg total) by mouth daily. 30 tablet 1   fexofenadine (ALLEGRA) 180 MG tablet Take 180 mg by mouth daily as needed for allergies.     Fish Oil-Cholecalciferol (FISH OIL-VITAMIN D) 1200-1000 MG-UNIT CAPS Take 1 capsule by mouth daily.     hydrocortisone (ANUSOL-HC) 25 MG suppository Place 1 suppository (25 mg total) rectally at bedtime as needed for hemorrhoids or anal itching. 12 suppository 1   ibuprofen  (ADVIL,MOTRIN) 800 MG tablet Take 1 tablet (800 mg total) by mouth every 8 (eight) hours as needed (with food). 90 tablet 2   lidocaine (XYLOCAINE) 2 % jelly apply small amount topically to rectum 2-3 times a day 30 mL 0   Multiple Vitamin (MULTIVITAMIN) capsule Take 1 capsule by mouth daily.       omeprazole (PRILOSEC) 20 MG capsule Take 20 mg by mouth daily.     potassium chloride SA (KLOR-CON M) 20 MEQ tablet Take 1 tablet (20 mEq total) by mouth daily for 7 doses. 7 tablet 0   prednisoLONE (ORAPRED) 15 MG/5ML solution Take 13.3 mLs (40 mg total) by mouth daily before breakfast. 399 mL 3   No current facility-administered medications on file prior to visit.   Allergies  Allergen Reactions   Lipitor [Atorvastatin]     myalgias     Current Medications, Allergies, Past Medical History, Past Surgical History, Family History and Social History were reviewed in [employer] record.   Review of Systems:   Constitutional: Negative for fever, sweats, chills or weight loss.  Respiratory: Negative for shortness of breath.  Cardiovascular: Negative for chest pain, palpitations and leg swelling.  Gastrointestinal: See HPI.  Musculoskeletal: Negative for back pain or muscle aches.  Neurological: Negative for dizziness, headaches or paresthesias.    Physical Exam: BP 100/60   Pulse 93   Ht 5' 1.5" (1.562 m)   Wt 194 lb (88 kg)   BMI 36.06 kg/m   General: Grossly jaundice, ill appearing female.  Head: Normocephalic and atraumatic. Eyes: Severe scleral icterus. Conjunctiva pink . Ears: Normal auditory acuity. Mouth: Dentition intact. No ulcers or lesions.  Lungs: Clear throughout to auscultation. Heart: Regular rate and rhythm, no murmur. Abdomen: Distended, ascites with lower abdominal anasarca component, left live lobe enlarged/palpated. No palpable mass. No obvious splenomegaly. Rectal: Deferred.  Musculoskeletal: Symmetrical with no gross  deformities. Extremities: Bilateral lower extremities with 2+ pitting edema.  Neurological: Alert oriented x 4. No focal deficits. No asterixis.  Psychological: Alert and cooperative. Normal mood and affect  Assessment and Recommendations:  51 year old female with alcohol associated hepatitis with jaundice. MDF score 56.7 on 08/18/2022. INR 2.3. Started on Prednisone 64m po QD on 11/7 then switched Prednisolone. Lille score 11/14 on day # 7: 0.350 (indicated good prognosis). Synthetic function declining. Labs today showed a total bili level of 16.8, INR 2.6 and Albumin 2.9. MELD Na 31.  Normal renal function.  She is not a liver transplant candidate at this time as her last alcohol intake was 08/15/2022.   She must remain abstinent from alcohol for 6 months prior to being considered for a liver transplant.  No clinical signs of hepatic encephalopathy. [facility] at [facility] as discussed with Dr. [facility]. -Recommend CTAP or abdominal MRI, liver doppler, ECHO, IV albumin, Vitamin K.  [facility]   Coagulopathy secondary to alcohol associated hepatitis. Received vitamin K 161mpo x 3 days two weeks ago. INR 2.9. She had 3 heavy nose bleeds last night and bright red rectal bleeding past 3 days, today rectal bleeding  was less.    Thrombocytopenia, secondary to alcohol use disorder  GERD.  EGD 02/2022 showed a 1 cm hiatal hernia and mild gastritis. -Continue Omeprazole 20 mg daily   History of hyperplastic rectal/rectosigmoid polyps per colonoscopy 02/2022

## 2022-09-01 NOTE — Telephone Encounter (Signed)
Pt sent message saying:   So 1 mg definitely worked better that 1/2 mg. Slept much better. They may even leave me a little loopy in the morning but wears off after I get up.   Got labs today and see gi pa at 130 today. Hoping for the best

## 2022-09-01 NOTE — Telephone Encounter (Signed)
Sent mychart letting pt know  ?

## 2022-09-01 NOTE — Patient Instructions (Addendum)
Plan to admit to [facility], await admission instructions.  Go to the emergency room if you develop active bleeding or worsening symptoms prior to being admitted to [place]: Patient will present to [facility] ED as there is a wait list for beds as a direct admission.  Apply a small amount of Desitin inside the anal opening and to the external anal area tid as needed for anal or hemorrhoidal irritation/bleeding.   Thank you for trusting me with your gastrointestinal care!   Carl Best, CRNP

## 2022-09-01 NOTE — ED Provider Notes (Signed)
[facility] DEPT Provider Note  CSN: [id number] Arrival date & time: 09/01/22 1650  Chief Complaint(s) Jaundice  HPI Jamie Coleman is a 51 y.o. female with PMH alcoholic hepatitis and alcoholic cirrhosis currently currently following with [facility] who presents emergency department for evaluation of worsening jaundice and abnormal labs.  Patient has had worsening symptoms over the last 3 weeks and received laboratory evaluation today that shows a rising INR and was instructed to come the emergency department for hospital admission.  She has been on a prednisolone course and oral vitamin K.  Endorses 3 nosebleeds last night and some scant rectal bleeding from her known hemorrhoids but denies hematemesis.  Denies chest pain, shortness of breath, headache, fever or other systemic symptoms.  Patient noticeably jaundiced on arrival.   Past Medical History Past Medical History:  Diagnosis Date   Allergy    allergic rhinitis   Anxiety    situational   COVID 05/2021   Difficult intubation    states 2012 ablation surgery, had diff intubation,glide scope used, not noted in EPIC anesthesia record   Elevated hemoglobin A1c 08/08/2015   Elevated liver function tests 08/08/2015   GERD (gastroesophageal reflux disease)    Hyperlipidemia    Low serum vitamin D 08/08/2015   Status post endometrial ablation    Tennis elbow    currently in PT-10/16   Patient Active Problem List   Diagnosis Date Noted   Liver failure ([place]) 09/01/2022   Shortness of breath 05/05/2022   Bronchitis due to COVID-19 virus 05/05/2022   Alcohol dependence ([place]) 96/29/5284   Alcoholic hepatitis 13/24/4010   Alcohol withdrawal syndrome without complication (HCC)    Elevated liver transaminase level 09/03/2019   Elevated TSH 09/03/2019   Vitamin D deficiency 03/02/2018   Fatigue 03/02/2018   Pupil asymmetry 08/04/2016   Hemorrhoids 12/28/2014   Prediabetes 08/28/2014   Tremor  06/30/2013   Sebaceous cyst 02/15/2013   OTHER&UNSPECIFIED DISEASES THE ORAL SOFT TISSUES 07/25/2008   DERMATOFIBROMA, ARM 11/11/2007   NEOPLASM, SKIN, UNCERTAIN BEHAVIOR 27/25/3664   Hyperlipidemia 02/10/2007   Anxiety disorder 02/10/2007   ALLERGIC RHINITIS 02/10/2007   GERD 02/10/2007   IBS 02/10/2007   Home Medication(s) Prior to Admission medications   Medication Sig Start Date End Date Taking? Authorizing Provider  Ascorbic Acid (VITAMIN C) 1000 MG tablet Take 1,000 mg by mouth daily.    [provider]  calcium carbonate (OSCAL) 1500 (600 Ca) MG TABS tablet Take 600 mg of elemental calcium by mouth daily with breakfast.    [provider]  Cholecalciferol (VITAMIN D3) 5000 units TABS Take 2,000 Units by mouth every Monday, Wednesday, and Friday.    [provider]  diltiazem 2 % GEL Apply 1 application topically 3 (three) times daily. 12/04/21   Vladimir Crofts, PA-C  escitalopram (LEXAPRO) 10 MG tablet Take 1 tablet (10 mg total) by mouth daily. 08/28/22   Tower, Wynelle Fanny, MD  fexofenadine (ALLEGRA) 180 MG tablet Take 180 mg by mouth daily as needed for allergies.    [provider]  Fish Oil-Cholecalciferol (FISH OIL-VITAMIN D) 1200-1000 MG-UNIT CAPS Take 1 capsule by mouth daily.    [provider]  hydrocortisone (ANUSOL-HC) 25 MG suppository Place 1 suppository (25 mg total) rectally at bedtime as needed for hemorrhoids or anal itching. 02/09/22 02/09/23  Mansouraty, Telford Nab., MD  ibuprofen (ADVIL,MOTRIN) 800 MG tablet Take 1 tablet (800 mg total) by mouth every 8 (eight) hours as needed (with food).  04/09/15   Tower, Wynelle Fanny, MD  lidocaine (XYLOCAINE) 2 % jelly apply small amount topically to rectum 2-3 times a day 12/04/21   Vladimir Crofts, PA-C  LORazepam (ATIVAN) 1 MG tablet Take 0.5-1 tablets (0.5-1 mg total) by mouth 2 (two) times daily as needed for anxiety or sleep. 09/01/22   Tower, Wynelle Fanny, MD  Multiple Vitamin  (MULTIVITAMIN) capsule Take 1 capsule by mouth daily.      [provider]  omeprazole (PRILOSEC) 20 MG capsule Take 20 mg by mouth daily.    [provider]  potassium chloride SA (KLOR-CON M) 20 MEQ tablet Take 1 tablet (20 mEq total) by mouth daily for 7 doses. 08/26/22 09/02/22  Mansouraty, Telford Nab., MD  prednisoLONE (ORAPRED) 15 MG/5ML solution Take 13.3 mLs (40 mg total) by mouth daily before breakfast. 08/26/22 09/25/22  Mansouraty, Telford Nab., MD                                                                                                                                    Past Surgical History Past Surgical History:  Procedure Laterality Date   BIOPSY  02/09/2022   Procedure: BIOPSY;  Surgeon: Rush Landmark Telford Nab., MD;  Location: [facility];  Service: Gastroenterology;;   CESAREAN SECTION     COLONOSCOPY WITH PROPOFOL N/A 02/09/2022   Procedure: COLONOSCOPY WITH PROPOFOL;  Surgeon: Irving Copas., MD;  Location: [facility];  Service: Gastroenterology;  Laterality: N/A;   ENDOMETRIAL ABLATION     8/12   ESOPHAGOGASTRODUODENOSCOPY (EGD) WITH PROPOFOL N/A 02/09/2022   Procedure: ESOPHAGOGASTRODUODENOSCOPY (EGD) WITH PROPOFOL;  Surgeon: Rush Landmark Telford Nab., MD;  Location: [facility];  Service: Gastroenterology;  Laterality: N/A;   EXTREMITY WIRE/PIN REMOVAL Left 07/16/2016   Procedure: PIN REMOVAL OF LEFT WRIST X 2;  Surgeon: Roseanne Kaufman, MD;  Location: [facility];  Service: Orthopedics;  Laterality: Left;   FOOT SURGERY Left    LAPAROSCOPIC TUBAL LIGATION  06/09/2011   Procedure: LAPAROSCOPIC TUBAL LIGATION;  Surgeon: Arloa Koh;  Location: [facility];  Service: Gynecology;  Laterality: N/A;   LIGAMENT REPAIR Left 05/08/2016   Procedure: LEFT WRIST SCAPHOLUNATE LIGAMENT RECONSTRUCTION WITH TENDON GRAFT PIN NEURECTOMY AND REPAIR;  Surgeon: Roseanne Kaufman, MD;  Location: [facility];  Service: Orthopedics;  Laterality:  Left;   POLYPECTOMY  02/09/2022   Procedure: POLYPECTOMY;  Surgeon: Mansouraty, Telford Nab., MD;  Location: [facility];  Service: Gastroenterology;;   TONSILLECTOMY     as child   Family History Family History  Problem Relation Age of Onset   Cancer Mother        CA insitu of appendix   Heart disease Father        CAD   Diabetes Father        type II   Alzheimer's disease Father    Colon polyps Father    Diabetes Brother  type II   Diabetes Maternal Grandfather    Diabetes Paternal Grandmother    Diabetes Paternal Grandfather    Breast cancer Neg Hx    Colon cancer Neg Hx    Stomach cancer Neg Hx    Esophageal cancer Neg Hx    Pancreatic cancer Neg Hx     Social History Social History   Tobacco Use   Smoking status: Former    Types: Cigarettes    Quit date: 07/25/1983    Years since quitting: 39.1   Smokeless tobacco: Never   Tobacco comments:    in high school  Vaping Use   Vaping Use: Never used  Substance Use Topics   Alcohol use: Not Currently    Alcohol/week: 14.0 standard drinks of alcohol    Types: 14 Glasses of wine per week    Comment: 6 shots daily 12 shots daily on weekends   Drug use: No   Allergies Lipitor [atorvastatin]  Review of Systems Review of Systems  Gastrointestinal:  Positive for abdominal distention.  Skin:  Positive for color change.    Physical Exam Vital Signs  I have reviewed the triage vital signs BP 136/70   Pulse 94   Temp 98.9 F (37.2 C) (Oral)   Resp 18   Ht 5' 1.5" (1.562 m)   Wt 88 kg   SpO2 96%   BMI 36.06 kg/m   Physical Exam Vitals and nursing note reviewed.  Constitutional:      General: She is not in acute distress.    Appearance: She is well-developed.  HENT:     Head: Normocephalic and atraumatic.  Eyes:     General: Scleral icterus present.     Conjunctiva/sclera: Conjunctivae normal.  Cardiovascular:     Rate and Rhythm: Normal rate and regular rhythm.     Heart sounds: No murmur  heard. Pulmonary:     Effort: Pulmonary effort is normal. No respiratory distress.     Breath sounds: Normal breath sounds.  Abdominal:     General: There is distension.     Palpations: Abdomen is soft.     Tenderness: There is no abdominal tenderness.  Musculoskeletal:        General: No swelling.     Cervical back: Neck supple.  Skin:    General: Skin is warm and dry.     Capillary Refill: Capillary refill takes less than 2 seconds.  Neurological:     Mental Status: She is alert.  Psychiatric:        Mood and Affect: Mood normal.     ED Results and Treatments Labs (all labs ordered are listed, but only abnormal results are displayed) Labs Reviewed  COMPREHENSIVE METABOLIC PANEL - Abnormal; Notable for the following components:      Result Value   Sodium 134 (*)    Glucose, Bld 130 (*)    Calcium 8.6 (*)    Albumin 2.5 (*)    AST 125 (*)    ALT 73 (*)    Alkaline Phosphatase 211 (*)    Total Bilirubin 19.6 (*)    All other components within normal limits  CBC WITH DIFFERENTIAL/PLATELET - Abnormal; Notable for the following components:   WBC 21.5 (*)    RBC 3.73 (*)    MCV 104.0 (*)    MCH 37.0 (*)    RDW 18.1 (*)    Neutro Abs 19.0 (*)    Monocytes Absolute 1.2 (*)    Abs Immature Granulocytes 0.20 (*)  All other components within normal limits  LIPASE, BLOOD - Abnormal; Notable for the following components:   Lipase 76 (*)    All other components within normal limits  PROTIME-INR - Abnormal; Notable for the following components:   Prothrombin Time 20.0 (*)    INR 1.7 (*)    All other components within normal limits  ACETAMINOPHEN LEVEL - Abnormal; Notable for the following components:   Acetaminophen (Tylenol), Serum <10 (*)    All other components within normal limits  CK - Abnormal; Notable for the following components:   Total CK 36 (*)    All other components within normal limits  TSH - Abnormal; Notable for the following components:   TSH 6.162 (*)     All other components within normal limits  RAPID URINE DRUG SCREEN, HOSP PERFORMED - Abnormal; Notable for the following components:   Benzodiazepines POSITIVE (*)    All other components within normal limits  IRON AND TIBC - Abnormal; Notable for the following components:   TIBC 160 (*)    Saturation Ratios 49 (*)    All other components within normal limits  URINE CULTURE  CULTURE, BLOOD (ROUTINE X 2)  CULTURE, BLOOD (ROUTINE X 2)  AMMONIA  FERRITIN  FIBRINOGEN  ALPHA-1-ANTITRYPSIN  CERULOPLASMIN  LIPID PANEL                                                                                                                          Radiology DG Chest Portable 1 View  Result Date: 09/01/2022 CLINICAL DATA:  Pulmonary edema and leg swelling. EXAM: PORTABLE CHEST 1 VIEW COMPARISON:  05/05/2022. FINDINGS: The heart size and mediastinal contours are within normal limits. No consolidation, effusion, or pneumothorax. No acute osseous abnormality. IMPRESSION: No active disease. Electronically Signed   By: Brett Fairy M.D.   On: 09/01/2022 21:09   CT ABDOMEN PELVIS W CONTRAST  Result Date: 09/01/2022 CLINICAL DATA:  Epigastric pain. Pt states she is in liver failure. States she had labs drawn this morning and was told to come to the hospital for admission. Pt reports nose bleeds, bleeding hemorrhoids, yellow skin. EXAM: CT ABDOMEN AND PELVIS WITH CONTRAST TECHNIQUE: Multidetector CT imaging of the abdomen and pelvis was performed using the standard protocol following bolus administration of intravenous contrast. RADIATION DOSE REDUCTION: This exam was performed according to the departmental dose-optimization program which includes automated exposure control, adjustment of the mA and/or kV according to patient size and/or use of iterative reconstruction technique. CONTRAST:  125m OMNIPAQUE IOHEXOL 300 MG/ML  SOLN COMPARISON:  Ultrasound abdomen 09/17/2021 FINDINGS: Lower chest: No acute  abnormality. Hepatobiliary: Nodular hepatic contour. Enlarged left hepatic lobe. The liver is enlarged measuring up to 20 cm. No focal liver abnormality. Contracted gallbladder. No gallstones, gallbladder wall thickening, or pericholecystic fluid. No biliary dilatation. Pancreas: No focal lesion. Normal pancreatic contour. No surrounding inflammatory changes. No main pancreatic ductal dilatation. Spleen: Normal in size without focal abnormality. Adrenals/Urinary Tract: No adrenal nodule  bilaterally. Bilateral kidneys enhance symmetrically. No hydronephrosis. No hydroureter. The urinary bladder is unremarkable. On delayed imaging, there is no urothelial wall thickening and there are no filling defects in the opacified portions of the bilateral collecting systems or ureters. Stomach/Bowel: Stomach is within normal limits. No evidence of bowel wall thickening or dilatation. Appendix appears normal. Vascular/Lymphatic: Recanalized paraumbilical vein. Paraesophageal, perigastric, and abdominopelvic venous collaterals are noted. The main portal, splenic, superior mesenteric veins are patent. No abdominal aorta or iliac aneurysm. Mild atherosclerotic plaque of the aorta and its branches. No abdominal, pelvic, or inguinal lymphadenopathy. Reproductive: Uterus and bilateral adnexa are unremarkable. Other: Trace to small volume simple free fluid within the upper abdomen and within the pelvis. No intraperitoneal free gas. No organized fluid collection. Musculoskeletal: Subcutaneus soft tissue edema. No suspicious lytic or blastic osseous lesions. No acute displaced fracture. Multilevel degenerative changes of the spine. IMPRESSION: 1. Cirrhosis with portal hypertension. No focal liver lesions identified. Please note that liver protocol enhanced MR and CT are the most sensitive tests for the screening detection of hepatocellular carcinoma in the high risk setting of cirrhosis. 2. Trace to small volume simple free fluid ascites.  3.  Aortic Atherosclerosis (ICD10-I70.0). Electronically Signed   By: Iven Finn M.D.   On: 09/01/2022 20:10    Pertinent labs & imaging results that were available during my care of the patient were reviewed by me and considered in my medical decision making (see MDM for details).  Medications Ordered in ED Medications  iohexol (OMNIPAQUE) 300 MG/ML solution 100 mL (100 mLs Intravenous Contrast Given 09/01/22 1948)  phytonadione (VITAMIN K) 10 mg in dextrose 5 % 50 mL IVPB (0 mg Intravenous Stopped 09/01/22 2223)  albumin human 25 % solution 25 g (25 g Intravenous New Bag/Given 09/01/22 2220)                                                                                                                                     Procedures Ultrasound ED Abd  Date/Time: 09/01/2022 10:58 PM  Performed by: Teressa Lower, MD Authorized by: Teressa Lower, MD   Procedure details:    Assessment for:  Intra-abdominal fluid    Images: not archived      (including critical care time)  Medical Decision Making / ED Course   This patient presents to the ED for concern of jaundice, abnormal labs, this involves an extensive number of treatment options, and is a complaint that carries with it a high risk of complications and morbidity.  The differential diagnosis includes jaundice, abnormal labs  MDM: Patient seen in the emergency room for evaluation of jaundice and abnormal labs.  Physical exam with scleral icterus and abdominal distention.  Laboratory evaluation with leukocytosis of 21.5 likely elevated secondary to her current prednisone use, chemistry with rising total bilirubin to 19.6, alk phos 211, AST 125, ALT 73, INR 1.7.  Multiple additional laboratory studies and imaging  studies requested by patient's primary gastroenterologist and these have been ordered.  CT abdomen pelvis with ascites but performed a bedside ultrasound and saw minimal tappable ascites and thus will not perform  diagnostic tap at this time.  Patient then admitted to medicine for decompensated cirrhosis.   Additional history obtained: -Additional history obtained from husband -External records from outside source obtained and reviewed including: Chart review including previous notes, labs, imaging, consultation notes   Lab Tests: -I ordered, reviewed, and interpreted labs.   The pertinent results include:   Labs Reviewed  COMPREHENSIVE METABOLIC PANEL - Abnormal; Notable for the following components:      Result Value   Sodium 134 (*)    Glucose, Bld 130 (*)    Calcium 8.6 (*)    Albumin 2.5 (*)    AST 125 (*)    ALT 73 (*)    Alkaline Phosphatase 211 (*)    Total Bilirubin 19.6 (*)    All other components within normal limits  CBC WITH DIFFERENTIAL/PLATELET - Abnormal; Notable for the following components:   WBC 21.5 (*)    RBC 3.73 (*)    MCV 104.0 (*)    MCH 37.0 (*)    RDW 18.1 (*)    Neutro Abs 19.0 (*)    Monocytes Absolute 1.2 (*)    Abs Immature Granulocytes 0.20 (*)    All other components within normal limits  LIPASE, BLOOD - Abnormal; Notable for the following components:   Lipase 76 (*)    All other components within normal limits  PROTIME-INR - Abnormal; Notable for the following components:   Prothrombin Time 20.0 (*)    INR 1.7 (*)    All other components within normal limits  ACETAMINOPHEN LEVEL - Abnormal; Notable for the following components:   Acetaminophen (Tylenol), Serum <10 (*)    All other components within normal limits  CK - Abnormal; Notable for the following components:   Total CK 36 (*)    All other components within normal limits  TSH - Abnormal; Notable for the following components:   TSH 6.162 (*)    All other components within normal limits  RAPID URINE DRUG SCREEN, HOSP PERFORMED - Abnormal; Notable for the following components:   Benzodiazepines POSITIVE (*)    All other components within normal limits  IRON AND TIBC - Abnormal; Notable  for the following components:   TIBC 160 (*)    Saturation Ratios 49 (*)    All other components within normal limits  URINE CULTURE  CULTURE, BLOOD (ROUTINE X 2)  CULTURE, BLOOD (ROUTINE X 2)  AMMONIA  FERRITIN  FIBRINOGEN  ALPHA-1-ANTITRYPSIN  CERULOPLASMIN  LIPID PANEL      Imaging Studies ordered: I ordered imaging studies including chest x-ray, CTAP I independently visualized and interpreted imaging. I agree with the radiologist interpretation   Medicines ordered and prescription drug management: Meds ordered this encounter  Medications   iohexol (OMNIPAQUE) 300 MG/ML solution 100 mL   phytonadione (VITAMIN K) 10 mg in dextrose 5 % 50 mL IVPB   albumin human 25 % solution 25 g    -I have reviewed the patients home medicines and have made adjustments as needed  Critical interventions none  Consultations Obtained: I requested consultation with the gastroenterologist on-call Dr. Lyndel Safe,  and discussed lab and imaging findings as well as pertinent plan - they recommend: Medical admission   Cardiac Monitoring: The patient was maintained on a cardiac monitor.  I personally viewed and interpreted  the cardiac monitored which showed an underlying rhythm of: NSR  Social Determinants of Health:  Factors impacting patients care include: none   Reevaluation: After the interventions noted above, I reevaluated the patient and found that they have :stayed the same  Co morbidities that complicate the patient evaluation  Past Medical History:  Diagnosis Date   Allergy    allergic rhinitis   Anxiety    situational   COVID 05/2021   Difficult intubation    states 2012 ablation surgery, had diff intubation,glide scope used, not noted in EPIC anesthesia record   Elevated hemoglobin A1c 08/08/2015   Elevated liver function tests 08/08/2015   GERD (gastroesophageal reflux disease)    Hyperlipidemia    Low serum vitamin D 08/08/2015   Status post endometrial ablation     Tennis elbow    currently in PT-10/16      Dispostion: I considered admission for this patient, due to decompensated cirrhosis, patient require hospital admission     Final Clinical Impression(s) / ED Diagnoses Final diagnoses:  Jaundice     _0 @    Teressa Lower, MD 09/01/22 2259

## 2022-09-01 NOTE — Telephone Encounter (Signed)
Sent message letting provider know

## 2022-09-02 ENCOUNTER — Inpatient Hospital Stay (HOSPITAL_COMMUNITY): Payer: 59

## 2022-09-02 ENCOUNTER — Encounter (HOSPITAL_COMMUNITY): Payer: Self-pay | Admitting: Internal Medicine

## 2022-09-02 ENCOUNTER — Telehealth: Payer: Self-pay | Admitting: *Deleted

## 2022-09-02 DIAGNOSIS — K7030 Alcoholic cirrhosis of liver without ascites: Secondary | ICD-10-CM | POA: Diagnosis not present

## 2022-09-02 DIAGNOSIS — R601 Generalized edema: Secondary | ICD-10-CM

## 2022-09-02 DIAGNOSIS — K7200 Acute and subacute hepatic failure without coma: Secondary | ICD-10-CM

## 2022-09-02 DIAGNOSIS — R748 Abnormal levels of other serum enzymes: Secondary | ICD-10-CM

## 2022-09-02 DIAGNOSIS — E8779 Other fluid overload: Secondary | ICD-10-CM

## 2022-09-02 DIAGNOSIS — F1011 Alcohol abuse, in remission: Secondary | ICD-10-CM

## 2022-09-02 DIAGNOSIS — K7010 Alcoholic hepatitis without ascites: Secondary | ICD-10-CM | POA: Diagnosis not present

## 2022-09-02 DIAGNOSIS — E038 Other specified hypothyroidism: Secondary | ICD-10-CM

## 2022-09-02 DIAGNOSIS — K709 Alcoholic liver disease, unspecified: Secondary | ICD-10-CM

## 2022-09-02 DIAGNOSIS — D7589 Other specified diseases of blood and blood-forming organs: Secondary | ICD-10-CM | POA: Insufficient documentation

## 2022-09-02 LAB — RETICULOCYTES
Immature Retic Fract: 13.5 % (ref 2.3–15.9)
RBC.: 3.35 MIL/uL — ABNORMAL LOW (ref 3.87–5.11)
Retic Count, Absolute: 96.8 10*3/uL (ref 19.0–186.0)
Retic Ct Pct: 2.9 % (ref 0.4–3.1)

## 2022-09-02 LAB — CBC
HCT: 35 % — ABNORMAL LOW (ref 36.0–46.0)
Hemoglobin: 12.2 g/dL (ref 12.0–15.0)
MCH: 36.2 pg — ABNORMAL HIGH (ref 26.0–34.0)
MCHC: 34.9 g/dL (ref 30.0–36.0)
MCV: 103.9 fL — ABNORMAL HIGH (ref 80.0–100.0)
Platelets: 210 10*3/uL (ref 150–400)
RBC: 3.37 MIL/uL — ABNORMAL LOW (ref 3.87–5.11)
RDW: 18.4 % — ABNORMAL HIGH (ref 11.5–15.5)
WBC: 12.8 10*3/uL — ABNORMAL HIGH (ref 4.0–10.5)
nRBC: 0 % (ref 0.0–0.2)

## 2022-09-02 LAB — URINALYSIS, ROUTINE W REFLEX MICROSCOPIC
Bacteria, UA: NONE SEEN
Glucose, UA: NEGATIVE mg/dL
Ketones, ur: NEGATIVE mg/dL
Leukocytes,Ua: NEGATIVE
Nitrite: NEGATIVE
Protein, ur: NEGATIVE mg/dL
Specific Gravity, Urine: 1.01 (ref 1.005–1.030)
pH: 6 (ref 5.0–8.0)

## 2022-09-02 LAB — COMPREHENSIVE METABOLIC PANEL
ALT: 70 U/L — ABNORMAL HIGH (ref 0–44)
AST: 113 U/L — ABNORMAL HIGH (ref 15–41)
Albumin: 2.5 g/dL — ABNORMAL LOW (ref 3.5–5.0)
Alkaline Phosphatase: 192 U/L — ABNORMAL HIGH (ref 38–126)
Anion gap: 9 (ref 5–15)
BUN: 17 mg/dL (ref 6–20)
CO2: 25 mmol/L (ref 22–32)
Calcium: 8.3 mg/dL — ABNORMAL LOW (ref 8.9–10.3)
Chloride: 101 mmol/L (ref 98–111)
Creatinine, Ser: 0.43 mg/dL — ABNORMAL LOW (ref 0.44–1.00)
GFR, Estimated: >60 mL/min (ref >60)
Glucose, Bld: 101 mg/dL — ABNORMAL HIGH (ref 70–99)
Potassium: 3.7 mmol/L (ref 3.5–5.1)
Sodium: 135 mmol/L (ref 135–145)
Total Bilirubin: 17.3 mg/dL — ABNORMAL HIGH (ref 0.3–1.2)
Total Protein: 6.5 g/dL (ref 6.5–8.1)

## 2022-09-02 LAB — ETHANOL: Alcohol, Ethyl (B): <10 mg/dL (ref <10)

## 2022-09-02 LAB — ECHOCARDIOGRAM COMPLETE
AR max vel: 2.25 cm2
AV Area VTI: 2.25 cm2
AV Area mean vel: 2.25 cm2
AV Mean grad: 6 mmHg
AV Peak grad: 8.9 mmHg
Ao pk vel: 1.49 m/s
Area-P 1/2: 3.31 cm2
Calc EF: 56.5 %
Height: 61.5 in
MV M vel: 2.31 m/s
MV Peak grad: 21.3 mmHg
S' Lateral: 3.2 cm
Single Plane A2C EF: 55.2 %
Single Plane A4C EF: 56.6 %
Weight: 3104 oz

## 2022-09-02 LAB — PROTIME-INR
INR: 1.6 — ABNORMAL HIGH (ref 0.8–1.2)
Prothrombin Time: 18.9 seconds — ABNORMAL HIGH (ref 11.4–15.2)

## 2022-09-02 LAB — DIFFERENTIAL
Basophils Absolute: 0 10*3/uL (ref 0.0–0.1)
Basophils Relative: 0 %
Eosinophils Absolute: 0.1 10*3/uL (ref 0.0–0.5)
Eosinophils Relative: 1 %
Lymphocytes Relative: 10 %
Lymphs Abs: 1.3 10*3/uL (ref 0.7–4.0)
Monocytes Absolute: 0.7 10*3/uL (ref 0.1–1.0)
Monocytes Relative: 6 %
Neutro Abs: 10.5 10*3/uL — ABNORMAL HIGH (ref 1.7–7.7)
Neutrophils Relative %: 83 %

## 2022-09-02 LAB — FOLATE: Folate: 12.1 ng/mL (ref >5.9)

## 2022-09-02 LAB — CBG MONITORING, ED: Glucose-Capillary: 101 mg/dL — ABNORMAL HIGH (ref 70–99)

## 2022-09-02 LAB — VITAMIN B12: Vitamin B-12: 1940 pg/mL — ABNORMAL HIGH (ref 180–914)

## 2022-09-02 LAB — T4, FREE: Free T4: 1.1 ng/dL (ref 0.61–1.12)

## 2022-09-02 LAB — BRAIN NATRIURETIC PEPTIDE: B Natriuretic Peptide: 154.9 pg/mL — ABNORMAL HIGH (ref 0.0–100.0)

## 2022-09-02 MED ORDER — ALBUTEROL SULFATE (2.5 MG/3ML) 0.083% IN NEBU: 2.5000 mg | INHALATION_SOLUTION | RESPIRATORY_TRACT | Status: DC | PRN

## 2022-09-02 MED ORDER — POTASSIUM CHLORIDE 20 MEQ PO PACK
40.0000 meq | PACK | Freq: Once | ORAL | Status: AC
  Administered 2022-09-02: 40 meq via ORAL
  Filled 2022-09-02: qty 2

## 2022-09-02 MED ORDER — HYDROCORTISONE (PERIANAL) 2.5 % EX CREA
TOPICAL_CREAM | Freq: Every day | CUTANEOUS | Status: DC
  Filled 2022-09-02 (×3): qty 28.35

## 2022-09-02 MED ORDER — GADOBUTROL 1 MMOL/ML IV SOLN
9.0000 mL | Freq: Once | INTRAVENOUS | Status: AC | PRN
  Administered 2022-09-02: 9 mL via INTRAVENOUS

## 2022-09-02 MED ORDER — FUROSEMIDE 10 MG/ML IJ SOLN
20.0000 mg | Freq: Once | INTRAMUSCULAR | Status: AC
  Administered 2022-09-02: 20 mg via INTRAVENOUS
  Filled 2022-09-02: qty 2

## 2022-09-02 MED ORDER — PREDNISOLONE 5 MG PO TABS
40.0000 mg | ORAL_TABLET | Freq: Once | ORAL | Status: AC
  Administered 2022-09-02: 40 mg via ORAL
  Filled 2022-09-02: qty 8

## 2022-09-02 MED ORDER — ENSURE MAX PROTEIN PO LIQD
11.0000 [oz_av] | Freq: Two times a day (BID) | ORAL | Status: DC
  Administered 2022-09-03 – 2022-09-07 (×9): 11 [oz_av] via ORAL
  Filled 2022-09-02 (×10): qty 330

## 2022-09-02 MED ORDER — LACTULOSE 10 GM/15ML PO SOLN
20.0000 g | Freq: Every day | ORAL | Status: DC
  Administered 2022-09-02 – 2022-09-03 (×2): 20 g via ORAL
  Administered 2022-09-04 – 2022-09-05 (×2): 10 g via ORAL
  Filled 2022-09-02 (×6): qty 30

## 2022-09-02 MED ORDER — POLYVINYL ALCOHOL 1.4 % OP SOLN: 1.0000 [drp] | Freq: Four times a day (QID) | OPHTHALMIC | Status: DC | PRN

## 2022-09-02 MED ORDER — ONDANSETRON HCL 4 MG/2ML IJ SOLN: 4.0000 mg | Freq: Four times a day (QID) | INTRAMUSCULAR | Status: DC | PRN

## 2022-09-02 MED ORDER — DEXTROSE 5 % IV SOLN
12.5000 mg/kg/h | INTRAVENOUS | Status: AC
  Administered 2022-09-02: 12.5 mg/kg/h via INTRAVENOUS
  Filled 2022-09-02: qty 90

## 2022-09-02 MED ORDER — VITAMIN K1 10 MG/ML IJ SOLN
10.0000 mg | Freq: Once | INTRAVENOUS | Status: AC
  Administered 2022-09-02: 10 mg via INTRAVENOUS
  Filled 2022-09-02: qty 1

## 2022-09-02 MED ORDER — ACETYLCYSTEINE LOAD VIA INFUSION
150.0000 mg/kg | Freq: Once | INTRAVENOUS | Status: AC
  Administered 2022-09-02: 13200 mg via INTRAVENOUS
  Filled 2022-09-02: qty 433

## 2022-09-02 MED ORDER — DEXTROSE 5 % IV SOLN
6.2500 mg/kg/h | INTRAVENOUS | Status: AC
  Administered 2022-09-02 – 2022-09-04 (×2): 6.25 mg/kg/h via INTRAVENOUS
  Filled 2022-09-02: qty 90
  Filled 2022-09-02 (×2): qty 60

## 2022-09-02 MED ORDER — ONDANSETRON HCL 4 MG PO TABS: 4.0000 mg | ORAL_TABLET | Freq: Four times a day (QID) | ORAL | Status: DC | PRN

## 2022-09-02 MED ORDER — PREDNISOLONE 5 MG PO TABS
40.0000 mg | ORAL_TABLET | Freq: Every day | ORAL | Status: DC
  Administered 2022-09-03 – 2022-09-07 (×5): 40 mg via ORAL
  Filled 2022-09-02 (×5): qty 8

## 2022-09-02 MED ORDER — LORAZEPAM 0.5 MG PO TABS
0.5000 mg | ORAL_TABLET | Freq: Two times a day (BID) | ORAL | Status: DC | PRN
  Administered 2022-09-02: 0.5 mg via ORAL
  Filled 2022-09-02: qty 2
  Filled 2022-09-02 (×2): qty 1

## 2022-09-02 NOTE — Assessment & Plan Note (Signed)
-   MCV 104 - B12 1,940 on 11/21

## 2022-09-02 NOTE — Assessment & Plan Note (Addendum)
-   on admission TB 16.8, PT/INR 26.7/2.6. AST/ALT 110/62, ALP 206 - s/p 10 mg Vit K on 11/21 followed by repeat 10 mg on 11/22 - trend PT/INR - prior hx etoh use; has been abstinent since ~08/17/22 - normal mentation; NH3 also normal on admission 29 - NAC initiated per GI given progressive hepatic dysfunction despite prior treatment; course to be completed 11/25 - possible evaluation for transplant pending GI discussions with transplant centers; tentatively planning for close outpatient followup with [facility] unless were to further decompensate requiring inpatient transfer (so far, remains stable/improving).  Plan is for outpatient follow-up with [facility] - no alpha 1 AT deficiency noted; ceruloplasmin ULN - trend coags and LFTs -Lactulose started per GI.  If develops encephalopathy, will trend ammonia (has remained lucid) ; discharged with daily lactulose

## 2022-09-02 NOTE — Progress Notes (Signed)
Progress Note    Jamie Coleman   RDE:[id number]  DOB: [birth year]  DOA: 09/01/2022     1 PCP: Abner Greenspan, MD  Initial CC: worsening liver function   Hospital Course: Jamie Coleman is a 51 yo female with PMH etoh hepatitis, cirrhosis, anxiety, HLD, GERD, prediabetes who presented upon referral from the GI office on 09/01/22. She had been under treatment for etoh hepatitis with prednisolone with some response, however TB began trending up as well as concern for decompensation, therefore she was referred to the ER for further workup and evaluation.   Interval History:  Patient evaluated in the ER this morning with husband present bedside.  Denies any nausea, vomiting, abdominal pain.  Does endorse ongoing edema notably in her legs and abdomen.  Referred to the ER due to worsening liver function on lab workup prior to admission in GI office.  She has remained abstinent since approximately 08/17/2022.  Assessment and Plan: * Acute liver failure without hepatic coma - TB 16.8, PT/INR 26.7/2.6. AST/ALT 110/62, ALP 206 - s/p 10 mg Vit K on 11/21 followed by repeat 10 mg on 11/22 - trend PT/INR - prior hx etoh use; has been abstinent since ~08/17/22 - normal mentation; NH3 also normal on admission 29 - NAC initiated per GI given progressive hepatic dysfunction despite prior tx  - possible evaluation for transplant pending GI discussions with transplant centers - followup alpha1AT and ceruloplasmin - trend coags and LFTs -Lactulose started per GI.  If develops worsening mentation, will trend ammonia  Alcoholic hepatitis - previously treated last hospitalization with steroids with good response per [facility] model; however repeat MDF up to 84 points on admission  - prednisolone resumed per GI  Cirrhosis, alcoholic (HCC) - small volume ascites noted on imaging; likely not enough for paracentesis  - continue trending LFTs - continue lactulose  - see ALF -Portal venous hypertension  appreciated on MRCP.  Portosystemic collateral vessels noted.  Patient has undergone EGD and colonoscopy May 2023  Elevated lipase - Lipase 76 on admission - patient denies any N/V nor abdominal pain; MRCP notes edematous pancreas with peripancreatic fluid.  Clinically does not have pancreatitis.  Edematous findings possibly in the setting of tissue edema from hypoalbuminemia -Continue monitoring for any further developing signs of pancreatitis  Macrocytosis without anemia - MCV 104 - B12 1,940 on 11/21  Subclinical hypothyroidism - TSH 6.162, FT4 1.1 (normal range) - TSH likely elevated in setting of acute illness - repeat TSH in 4-6 week or once more medically stable  Anxiety disorder - Continue Ativan as needed.  Caution for any oversedation   Old records reviewed in assessment of this patient  Antimicrobials:   DVT prophylaxis:  SCDs Start: 09/02/22 0134   Code Status:   Code Status: Full Code  Mobility Assessment (last 72 hours)     Mobility Assessment     Row Name 09/02/22 1230           Does patient have an order for bedrest or is patient medically unstable No - Continue assessment       What is the highest level of mobility based on the progressive mobility assessment? Level 5 (Walks with assist in room/hall) - Balance while stepping forward/back and can walk in room with assist - Complete                Barriers to discharge:  Disposition Plan:  Home vs tx to transplant center Status is: Inpt  Objective: Blood pressure  107/63, pulse 97, temperature 97.8 F (36.6 C), temperature source Oral, resp. rate 18, height 5' 1.5" (1.562 m), weight 88 kg, SpO2 96 %.  Examination:  Physical Exam Constitutional:      General: She is not in acute distress.    Appearance: Normal appearance.  HENT:     Head: Normocephalic and atraumatic.     Mouth/Throat:     Mouth: Mucous membranes are moist.  Eyes:     General: Scleral icterus present.     Extraocular  Movements: Extraocular movements intact.  Cardiovascular:     Rate and Rhythm: Normal rate and regular rhythm.  Pulmonary:     Effort: Pulmonary effort is normal.     Breath sounds: Normal breath sounds.  Abdominal:     General: Bowel sounds are normal. There is distension.     Palpations: Abdomen is soft.     Tenderness: There is no abdominal tenderness. There is no guarding.  Musculoskeletal:        General: Swelling present. Normal range of motion.     Cervical back: Normal range of motion and neck supple.  Skin:    General: Skin is warm.     Coloration: Skin is jaundiced.  Neurological:     General: No focal deficit present.     Mental Status: She is alert and oriented to person, place, and time.     Comments: No tremor or asterixis appreciated  Psychiatric:        Mood and Affect: Mood normal.        Behavior: Behavior normal.      Consultants:  GI  Procedures:    Data Reviewed: Results for orders placed or performed during the hospital encounter of 09/01/22 (from the past 24 hour(s))  Comprehensive metabolic panel     Status: Abnormal   Collection Time: 09/01/22  6:45 PM  Result Value Ref Range   Sodium 134 (L) 135 - 145 mmol/L   Potassium 4.2 3.5 - 5.1 mmol/L   Chloride 100 98 - 111 mmol/L   CO2 25 22 - 32 mmol/L   Glucose, Bld 130 (H) 70 - 99 mg/dL   BUN 16 6 - 20 mg/dL   Creatinine, Ser 0.46 0.44 - 1.00 mg/dL   Calcium 8.6 (L) 8.9 - 10.3 mg/dL   Total Protein 7.1 6.5 - 8.1 g/dL   Albumin 2.5 (L) 3.5 - 5.0 g/dL   AST 125 (H) 15 - 41 U/L   ALT 73 (H) 0 - 44 U/L   Alkaline Phosphatase 211 (H) 38 - 126 U/L   Total Bilirubin 19.6 (HH) 0.3 - 1.2 mg/dL   GFR, Estimated >60 >60 mL/min   Anion gap 9 5 - 15  CBC with Differential     Status: Abnormal   Collection Time: 09/01/22  6:45 PM  Result Value Ref Range   WBC 21.5 (H) 4.0 - 10.5 K/uL   RBC 3.73 (L) 3.87 - 5.11 MIL/uL   Hemoglobin 13.8 12.0 - 15.0 g/dL   HCT 38.8 36.0 - 46.0 %   MCV 104.0 (H) 80.0 -  100.0 fL   MCH 37.0 (H) 26.0 - 34.0 pg   MCHC 35.6 30.0 - 36.0 g/dL   RDW 18.1 (H) 11.5 - 15.5 %   Platelets 279 150 - 400 K/uL   nRBC 0.0 0.0 - 0.2 %   Neutrophils Relative % 88 %   Neutro Abs 19.0 (H) 1.7 - 7.7 K/uL   Lymphocytes Relative 5 %   Lymphs  Abs 1.0 0.7 - 4.0 K/uL   Monocytes Relative 6 %   Monocytes Absolute 1.2 (H) 0.1 - 1.0 K/uL   Eosinophils Relative 0 %   Eosinophils Absolute 0.0 0.0 - 0.5 K/uL   Basophils Relative 0 %   Basophils Absolute 0.1 0.0 - 0.1 K/uL   Immature Granulocytes 1 %   Abs Immature Granulocytes 0.20 (H) 0.00 - 0.07 K/uL  Lipase, blood     Status: Abnormal   Collection Time: 09/01/22  6:45 PM  Result Value Ref Range   Lipase 76 (H) 11 - 51 U/L  Protime-INR     Status: Abnormal   Collection Time: 09/01/22  6:45 PM  Result Value Ref Range   Prothrombin Time 20.0 (H) 11.4 - 15.2 seconds   INR 1.7 (H) 0.8 - 1.2  Acetaminophen level     Status: Abnormal   Collection Time: 09/01/22  6:45 PM  Result Value Ref Range   Acetaminophen (Tylenol), Serum <10 (L) 10 - 30 ug/mL  TSH     Status: Abnormal   Collection Time: 09/01/22  6:45 PM  Result Value Ref Range   TSH 6.162 (H) 0.350 - 4.500 uIU/mL  Rapid urine drug screen (hospital performed)     Status: Abnormal   Collection Time: 09/01/22  6:45 PM  Result Value Ref Range   Opiates NONE DETECTED NONE DETECTED   Cocaine NONE DETECTED NONE DETECTED   Benzodiazepines POSITIVE (A) NONE DETECTED   Amphetamines NONE DETECTED NONE DETECTED   Tetrahydrocannabinol NONE DETECTED NONE DETECTED   Barbiturates NONE DETECTED NONE DETECTED  Iron and TIBC     Status: Abnormal   Collection Time: 09/01/22  6:45 PM  Result Value Ref Range   Iron 79 28 - 170 ug/dL   TIBC 160 (L) 250 - 450 ug/dL   Saturation Ratios 49 (H) 10.4 - 31.8 %   UIBC 81 ug/dL  Ferritin (Iron Binding Protein)     Status: None   Collection Time: 09/01/22  6:45 PM  Result Value Ref Range   Ferritin 299 11 - 307 ng/mL  Lipid panel      Status: Abnormal   Collection Time: 09/01/22  6:45 PM  Result Value Ref Range   Cholesterol 266 (H) 0 - 200 mg/dL   Triglycerides 370 (H) <150 mg/dL   HDL <10 (L) >40 mg/dL   Total CHOL/HDL Ratio NOT CALCULATED RATIO   VLDL 74 (H) 0 - 40 mg/dL   LDL Cholesterol NOT CALCULATED 0 - 99 mg/dL  Ammonia     Status: None   Collection Time: 09/01/22  7:30 PM  Result Value Ref Range   Ammonia 29 9 - 35 umol/L  Blood culture (routine x 2)     Status: None (Preliminary result)   Collection Time: 09/01/22  8:30 PM   Specimen: BLOOD  Result Value Ref Range   Specimen Description      BLOOD LEFT ANTECUBITAL Performed at [facility], [place] [address] [place], [place] [address]    Special Requests      BOTTLES DRAWN AEROBIC AND ANAEROBIC Blood Culture adequate volume Performed at [facility] [address], [place], [place] [address]    Culture      NO GROWTH < 12 HOURS Performed at [facility] [address], [place], [place] [address]    Report Status PENDING   Blood culture (routine x 2)     Status: None (Preliminary result)   Collection Time: 09/01/22  8:55 PM   Specimen: BLOOD  Result Value Ref Range   Specimen Description      BLOOD RIGHT ANTECUBITAL Performed at [facility] [address] [place], [place] [address]    Special Requests      BOTTLES DRAWN AEROBIC AND ANAEROBIC Blood Culture adequate volume Performed at [facility] [address], [place], [place] [address]    Culture      NO GROWTH < 12 HOURS Performed at [facility] [address], [place], [place] [address]    Report Status PENDING   CK     Status: Abnormal   Collection Time: 09/01/22  9:01 PM  Result Value Ref Range   Total CK 36 (L) 38 - 234 U/L  Fibrinogen     Status: None   Collection Time: 09/01/22  9:01 PM  Result Value Ref Range   Fibrinogen 436 210 - 475 mg/dL  Vitamin B12     Status: Abnormal    Collection Time: 09/02/22  3:15 AM  Result Value Ref Range   Vitamin B-12 1,940 (H) 180 - 914 pg/mL  Folate     Status: None   Collection Time: 09/02/22  3:15 AM  Result Value Ref Range   Folate 12.1 >5.9 ng/mL  Reticulocytes     Status: Abnormal   Collection Time: 09/02/22  3:15 AM  Result Value Ref Range   Retic Ct Pct 2.9 0.4 - 3.1 %   RBC. 3.35 (L) 3.87 - 5.11 MIL/uL   Retic Count, Absolute 96.8 19.0 - 186.0 K/uL   Immature Retic Fract 13.5 2.3 - 15.9 %  Comprehensive metabolic panel     Status: Abnormal   Collection Time: 09/02/22  3:16 AM  Result Value Ref Range   Sodium 135 135 - 145 mmol/L   Potassium 3.7 3.5 - 5.1 mmol/L   Chloride 101 98 - 111 mmol/L   CO2 25 22 - 32 mmol/L   Glucose, Bld 101 (H) 70 - 99 mg/dL   BUN 17 6 - 20 mg/dL   Creatinine, Ser 0.43 (L) 0.44 - 1.00 mg/dL   Calcium 8.3 (L) 8.9 - 10.3 mg/dL   Total Protein 6.5 6.5 - 8.1 g/dL   Albumin 2.5 (L) 3.5 - 5.0 g/dL   AST 113 (H) 15 - 41 U/L   ALT 70 (H) 0 - 44 U/L   Alkaline Phosphatase 192 (H) 38 - 126 U/L   Total Bilirubin 17.3 (H) 0.3 - 1.2 mg/dL   GFR, Estimated >60 >60 mL/min   Anion gap 9 5 - 15  CBC     Status: Abnormal   Collection Time: 09/02/22  3:16 AM  Result Value Ref Range   WBC 12.8 (H) 4.0 - 10.5 K/uL   RBC 3.37 (L) 3.87 - 5.11 MIL/uL   Hemoglobin 12.2 12.0 - 15.0 g/dL   HCT 35.0 (L) 36.0 - 46.0 %   MCV 103.9 (H) 80.0 - 100.0 fL   MCH 36.2 (H) 26.0 - 34.0 pg   MCHC 34.9 30.0 - 36.0 g/dL   RDW 18.4 (H) 11.5 - 15.5 %   Platelets 210 150 - 400 K/uL   nRBC 0.0 0.0 - 0.2 %  Protime-INR     Status: Abnormal   Collection Time: 09/02/22  3:16 AM  Result Value Ref Range   Prothrombin Time 18.9 (H) 11.4 - 15.2 seconds   INR 1.6 (H) 0.8 - 1.2  Differential     Status: Abnormal  Collection Time: 09/02/22  3:16 AM  Result Value Ref Range   Neutrophils Relative % 83 %   Neutro Abs 10.5 (H) 1.7 - 7.7 K/uL   Lymphocytes Relative 10 %   Lymphs Abs 1.3 0.7 - 4.0 K/uL   Monocytes Relative  6 %   Monocytes Absolute 0.7 0.1 - 1.0 K/uL   Eosinophils Relative 1 %   Eosinophils Absolute 0.1 0.0 - 0.5 K/uL   Basophils Relative 0 %   Basophils Absolute 0.0 0.0 - 0.1 K/uL   Smear Review MORPHOLOGY UNREMARKABLE   T4, free     Status: None   Collection Time: 09/02/22  9:20 AM  Result Value Ref Range   Free T4 1.10 0.61 - 1.12 ng/dL  CBG monitoring, ED     Status: Abnormal   Collection Time: 09/02/22 10:13 AM  Result Value Ref Range   Glucose-Capillary 101 (H) 70 - 99 mg/dL  Ethanol     Status: None   Collection Time: 09/02/22  1:31 PM  Result Value Ref Range   Alcohol, Ethyl (B) <10 <10 mg/dL  Brain natriuretic peptide     Status: Abnormal   Collection Time: 09/02/22  1:31 PM  Result Value Ref Range   B Natriuretic Peptide 154.9 (H) 0.0 - 100.0 pg/mL    I have Reviewed nursing notes, Vitals, and Lab results since pt's last encounter. Pertinent lab results : see above I have ordered test including BMP, CBC, Mg I have reviewed the last note from staff over past 24 hours I have discussed pt's care plan and test results with nursing staff, case manager  Time spent: Greater than 50% of the 55 minute visit was spent in counseling/coordination of care for the patient as laid out in the A&P.    LOS: 1 day   Dwyane Dee, MD [facility] 09/02/2022, 4:22 PM

## 2022-09-02 NOTE — Assessment & Plan Note (Signed)
-   patient request to change to xanax as ativan caused her to feel anxious  - caution for oversedation

## 2022-09-02 NOTE — H&P (Signed)
History and Physical    Jamie Coleman VZD:[id number] DOB: [birth year] DOA: 09/01/2022  PCP: Abner Greenspan, MD  Patient coming from: home  I have personally briefly reviewed patient's old medical records in [place]  Chief Complaint: progressive etoh liver failure  HPI: Jamie Coleman is a 51 y.o. female with medical history significant of obesity, anxiety, alcohol use disorder, alcohol associated hepatitis, GERD, diverticulosis and rectosigmoid hyperplastic polyps who presents in referral from gi clinic after patient noted to have progressive liver failure with worsening meld score despite use of prednisolone. Per patient last drink 2 weeks ago. She notes no n/v/d/ but does note increase abdominal girth and swelling in lower extremities as well as sob. She notes no fever/ chills / but has had intermittent cough for some time. She denies dysuria.as well as chest pain.   Per gi note  MDF score 56.7 on 08/18/2022. INR 2.3. Started on Prednisone '40mg'$  po QD on 11/7 then switched Prednisolone. Lille score 11/14 on day # 7: 0.350 (indicated good prognosis). Synthetic function declining. Labs today showed a total bili level of 16.8, INR 2.6 and Albumin 2.9. MELD Na 31.    ED Course:  Afeb, bp 136/62, hr 92, sat 96% on ra  Wbc 21.5 (10.5), hgb 13.8, mcv 104,  pmn 19  Inr 1.7  NA 134, K 4.2, glu 130, cr 0.46, album 2.5, ast 125 (110)  AlT 73 ( 62) Alphos 211 (206) Lipase 76 Tylenol< 10 Tsh 6.1 IRJ:JOACZ+ Iron 79, tibc160 , ferritin 299 Tc 266TG 360 cK36   Tx vit k, albumin  Tx vit x 10 mg iv  Review of Systems: As per HPI otherwise 10 point review of systems negative.   Past Medical History:  Diagnosis Date   Allergy    allergic rhinitis   Anxiety    situational   COVID 05/2021   Difficult intubation    states 2012 ablation surgery, had diff intubation,glide scope used, not noted in EPIC anesthesia record   Elevated hemoglobin A1c 08/08/2015   Elevated liver  function tests 08/08/2015   GERD (gastroesophageal reflux disease)    Hyperlipidemia    Low serum vitamin D 08/08/2015   Status post endometrial ablation    Tennis elbow    currently in PT-10/16    Past Surgical History:  Procedure Laterality Date   BIOPSY  02/09/2022   Procedure: BIOPSY;  Surgeon: Irving Copas., MD;  Location: [facility];  Service: Gastroenterology;;   CESAREAN SECTION     COLONOSCOPY WITH PROPOFOL N/A 02/09/2022   Procedure: COLONOSCOPY WITH PROPOFOL;  Surgeon: Irving Copas., MD;  Location: [facility];  Service: Gastroenterology;  Laterality: N/A;   ENDOMETRIAL ABLATION     8/12   ESOPHAGOGASTRODUODENOSCOPY (EGD) WITH PROPOFOL N/A 02/09/2022   Procedure: ESOPHAGOGASTRODUODENOSCOPY (EGD) WITH PROPOFOL;  Surgeon: Rush Landmark Telford Nab., MD;  Location: [facility];  Service: Gastroenterology;  Laterality: N/A;   EXTREMITY WIRE/PIN REMOVAL Left 07/16/2016   Procedure: PIN REMOVAL OF LEFT WRIST X 2;  Surgeon: Roseanne Kaufman, MD;  Location: [facility];  Service: Orthopedics;  Laterality: Left;   FOOT SURGERY Left    LAPAROSCOPIC TUBAL LIGATION  06/09/2011   Procedure: LAPAROSCOPIC TUBAL LIGATION;  Surgeon: Arloa Koh;  Location: [facility];  Service: Gynecology;  Laterality: N/A;   LIGAMENT REPAIR Left 05/08/2016   Procedure: LEFT WRIST SCAPHOLUNATE LIGAMENT RECONSTRUCTION WITH TENDON GRAFT PIN NEURECTOMY AND REPAIR;  Surgeon: Roseanne Kaufman, MD;  Location: [facility];  Service: Orthopedics;  Laterality: Left;   POLYPECTOMY  02/09/2022   Procedure: POLYPECTOMY;  Surgeon: Mansouraty, Telford Nab., MD;  Location: [facility];  Service: Gastroenterology;;   TONSILLECTOMY     as child     reports that she quit smoking about 39 years ago. Her smoking use included cigarettes. She has never used smokeless tobacco. She reports that she does not currently use alcohol after a past usage of about 14.0 standard drinks of alcohol per  week. She reports that she does not use drugs.  Allergies  Allergen Reactions   Lipitor [Atorvastatin]     myalgias    Family History  Problem Relation Age of Onset   Cancer Mother        CA insitu of appendix   Heart disease Father        CAD   Diabetes Father        type II   Alzheimer's disease Father    Colon polyps Father    Diabetes Brother        type II   Diabetes Maternal Grandfather    Diabetes Paternal Grandmother    Diabetes Paternal Grandfather    Breast cancer Neg Hx    Colon cancer Neg Hx    Stomach cancer Neg Hx    Esophageal cancer Neg Hx    Pancreatic cancer Neg Hx     Prior to Admission medications   Medication Sig Start Date End Date Taking? Authorizing Provider  Ascorbic Acid (VITAMIN C) 1000 MG tablet Take 1,000 mg by mouth daily.    [provider]  calcium carbonate (OSCAL) 1500 (600 Ca) MG TABS tablet Take 600 mg of elemental calcium by mouth daily with breakfast.    [provider]  Cholecalciferol (VITAMIN D3) 5000 units TABS Take 2,000 Units by mouth every Monday, Wednesday, and Friday.    [provider]  diltiazem 2 % GEL Apply 1 application topically 3 (three) times daily. 12/04/21   Vladimir Crofts, PA-C  escitalopram (LEXAPRO) 10 MG tablet Take 1 tablet (10 mg total) by mouth daily. 08/28/22   Tower, Wynelle Fanny, MD  fexofenadine (ALLEGRA) 180 MG tablet Take 180 mg by mouth daily as needed for allergies.    [provider]  Fish Oil-Cholecalciferol (FISH OIL-VITAMIN D) 1200-1000 MG-UNIT CAPS Take 1 capsule by mouth daily.    [provider]  hydrocortisone (ANUSOL-HC) 25 MG suppository Place 1 suppository (25 mg total) rectally at bedtime as needed for hemorrhoids or anal itching. 02/09/22 02/09/23  Mansouraty, Telford Nab., MD  ibuprofen (ADVIL,MOTRIN) 800 MG tablet Take 1 tablet (800 mg total) by mouth every 8 (eight) hours as needed (with food). 04/09/15   Tower, Wynelle Fanny, MD  lidocaine (XYLOCAINE) 2 %  jelly apply small amount topically to rectum 2-3 times a day 12/04/21   Vladimir Crofts, PA-C  LORazepam (ATIVAN) 1 MG tablet Take 0.5-1 tablets (0.5-1 mg total) by mouth 2 (two) times daily as needed for anxiety or sleep. 09/01/22   Tower, Wynelle Fanny, MD  Multiple Vitamin (MULTIVITAMIN) capsule Take 1 capsule by mouth daily.      [provider]  omeprazole (PRILOSEC) 20 MG capsule Take 20 mg by mouth daily.    [provider]  potassium chloride SA (KLOR-CON M) 20 MEQ tablet Take 1 tablet (20 mEq total) by mouth daily for 7 doses. 08/26/22 09/02/22  Mansouraty, Telford Nab., MD  prednisoLONE (ORAPRED) 15 MG/5ML solution Take 13.3 mLs (40 mg total) by mouth  daily before breakfast. 08/26/22 09/25/22  Mansouraty, Telford Nab., MD    Physical Exam: Vitals:   09/01/22 1940 09/01/22 2059 09/02/22 0000 09/02/22 0053  BP: 130/85 136/70 122/82   Pulse: 96 94 86   Resp: '16 18 18   '$ Temp:  98.9 F (37.2 C)  98.6 F (37 C)  TempSrc:  Oral  Oral  SpO2: 96% 96% 93%   Weight:      Height:        Constitutional: NAD, calm, comfortable, jaundice Vitals:   09/01/22 1940 09/01/22 2059 09/02/22 0000 09/02/22 0053  BP: 130/85 136/70 122/82   Pulse: 96 94 86   Resp: '16 18 18   '$ Temp:  98.9 F (37.2 C)  98.6 F (37 C)  TempSrc:  Oral  Oral  SpO2: 96% 96% 93%   Weight:      Height:       Eyes: PERRL, lids and conjunctivae normal ENMT: Mucous membranes are moist. Posterior pharynx clear of any exudate or lesions.Normal dentition.  Neck: normal, supple, no masses, no thyromegaly Respiratory: clear to auscultation bilaterally, no wheezing, no crackles. Normal respiratory effort. No accessory muscle use.  Cardiovascular: Regular rate and rhythm, no murmurs / rubs / gallops. +extremity edema. + pedal pulses. No carotid bruits.  Abdomen: no tenderness,  distended. Bowel sounds positive.  Musculoskeletal: no clubbing / cyanosis. No joint deformity upper and lower extremities. Good ROM, no  contractures. Normal muscle tone.  Skin: no rashes, lesions, ulcers. No induration Neurologic: CN 2-12 grossly intact. Sensation intact,  Strength 5/5 in all 4.  Psychiatric: Normal judgment and insight. Alert and oriented x 3. Normal mood.    Labs on Admission: I have personally reviewed following labs and imaging studies  CBC: Recent Labs  Lab 09/01/22 0944 09/01/22 1845  WBC 17.0 Repeated and verified X2.* 21.5*  NEUTROABS 12.5* 19.0*  HGB 12.9 13.8  HCT 37.5 38.8  MCV 104.1* 104.0*  PLT 257.0 073   Basic Metabolic Panel: Recent Labs  Lab 09/01/22 0944 09/01/22 1845  NA 128* 134*  K 4.6 4.2  CL 98 100  CO2 23 25  GLUCOSE 168* 130*  BUN 17 16  CREATININE 0.67 0.46  CALCIUM 8.0* 8.6*   GFR: Estimated Creatinine Clearance: 83.9 mL/min (by C-G formula based on SCr of 0.46 mg/dL). Liver Function Tests: Recent Labs  Lab 09/01/22 0944 09/01/22 1845  AST 110*  110* 125*  ALT 62*  62* 73*  ALKPHOS 206*  206* 211*  BILITOT 16.8*  16.8* 19.6*  PROT 5.9*  5.9* 7.1  ALBUMIN 2.9*  2.9* 2.5*   Recent Labs  Lab 09/01/22 1845  LIPASE 76*   Recent Labs  Lab 09/01/22 1930  AMMONIA 29   Coagulation Profile: Recent Labs  Lab 09/01/22 0944 09/01/22 1845  INR 2.6* 1.7*   Cardiac Enzymes: Recent Labs  Lab 09/01/22 2101  CKTOTAL 36*   BNP (last 3 results) No results for input(s): "PROBNP" in the last 8760 hours. HbA1C: No results for input(s): "HGBA1C" in the last 72 hours. CBG: No results for input(s): "GLUCAP" in the last 168 hours. Lipid Profile: Recent Labs    09/01/22 1845  CHOL 266*  HDL <10*  LDLCALC NOT CALCULATED  TRIG 370*  CHOLHDL NOT CALCULATED   Thyroid Function Tests: Recent Labs    09/01/22 1845  TSH 6.162*   Anemia Panel: Recent Labs    09/01/22 1845  FERRITIN 299  TIBC 160*  IRON 79   Urine analysis:  Component Value Date/Time   COLORURINE YELLOW 09/17/2021 1017   APPEARANCEUR CLEAR 09/17/2021 1017   LABSPEC  1.010 09/17/2021 1017   PHURINE 7.0 09/17/2021 1017   GLUCOSEU NEGATIVE 09/17/2021 1017   HGBUR NEGATIVE 09/17/2021 [place] 09/17/2021 1017   BILIRUBINUR n 08/19/2016 [place] 09/17/2021 1017   PROTEINUR NEGATIVE 09/17/2021 1017   UROBILINOGEN 0.2 07/15/2020 1618   NITRITE NEGATIVE 09/17/2021 1017   LEUKOCYTESUR NEGATIVE 09/17/2021 1017    Radiological Exams on Admission: DG Chest Portable 1 View  Result Date: 09/01/2022 CLINICAL DATA:  Pulmonary edema and leg swelling. EXAM: PORTABLE CHEST 1 VIEW COMPARISON:  05/05/2022. FINDINGS: The heart size and mediastinal contours are within normal limits. No consolidation, effusion, or pneumothorax. No acute osseous abnormality. IMPRESSION: No active disease. Electronically Signed   By: Brett Fairy M.D.   On: 09/01/2022 21:09   CT ABDOMEN PELVIS W CONTRAST  Result Date: 09/01/2022 CLINICAL DATA:  Epigastric pain. Pt states she is in liver failure. States she had labs drawn this morning and was told to come to the hospital for admission. Pt reports nose bleeds, bleeding hemorrhoids, yellow skin. EXAM: CT ABDOMEN AND PELVIS WITH CONTRAST TECHNIQUE: Multidetector CT imaging of the abdomen and pelvis was performed using the standard protocol following bolus administration of intravenous contrast. RADIATION DOSE REDUCTION: This exam was performed according to the departmental dose-optimization program which includes automated exposure control, adjustment of the mA and/or kV according to patient size and/or use of iterative reconstruction technique. CONTRAST:  183m OMNIPAQUE IOHEXOL 300 MG/ML  SOLN COMPARISON:  Ultrasound abdomen 09/17/2021 FINDINGS: Lower chest: No acute abnormality. Hepatobiliary: Nodular hepatic contour. Enlarged left hepatic lobe. The liver is enlarged measuring up to 20 cm. No focal liver abnormality. Contracted gallbladder. No gallstones, gallbladder wall thickening, or pericholecystic fluid. No  biliary dilatation. Pancreas: No focal lesion. Normal pancreatic contour. No surrounding inflammatory changes. No main pancreatic ductal dilatation. Spleen: Normal in size without focal abnormality. Adrenals/Urinary Tract: No adrenal nodule bilaterally. Bilateral kidneys enhance symmetrically. No hydronephrosis. No hydroureter. The urinary bladder is unremarkable. On delayed imaging, there is no urothelial wall thickening and there are no filling defects in the opacified portions of the bilateral collecting systems or ureters. Stomach/Bowel: Stomach is within normal limits. No evidence of bowel wall thickening or dilatation. Appendix appears normal. Vascular/Lymphatic: Recanalized paraumbilical vein. Paraesophageal, perigastric, and abdominopelvic venous collaterals are noted. The main portal, splenic, superior mesenteric veins are patent. No abdominal aorta or iliac aneurysm. Mild atherosclerotic plaque of the aorta and its branches. No abdominal, pelvic, or inguinal lymphadenopathy. Reproductive: Uterus and bilateral adnexa are unremarkable. Other: Trace to small volume simple free fluid within the upper abdomen and within the pelvis. No intraperitoneal free gas. No organized fluid collection. Musculoskeletal: Subcutaneus soft tissue edema. No suspicious lytic or blastic osseous lesions. No acute displaced fracture. Multilevel degenerative changes of the spine. IMPRESSION: 1. Cirrhosis with portal hypertension. No focal liver lesions identified. Please note that liver protocol enhanced MR and CT are the most sensitive tests for the screening detection of hepatocellular carcinoma in the high risk setting of cirrhosis. 2. Trace to small volume simple free fluid ascites. 3.  Aortic Atherosclerosis (ICD10-I70.0). Electronically Signed   By: MIven FinnM.D.   On: 09/01/2022 20:10    EKG: Independently reviewed. N/a  Assessment/Plan   Progressive etoh induced liver failure  -admit per gi request  -cxr  NAD -per gi request: Please obtain a chest x-ray completed neg Please  obtain an urinalysis with urine culture Please obtain blood cultures x2. Please obtain a CT abdomen/pelvis with IV and oral contrast to better define the portal vasculature Please obtain a fibrinogen level Please give IV vitamin K 10 mg x 1 dose Please obtain a CPK level Please order a TTE to be completed tomorrow Please obtain a TSH elevated expansion ordered  Please obtain a Urine Toxin screen Please obtain Alpha-1-antitrypsin Ab Please obtain Ceruloplasmin Please obtain Iron, TIBC, Ferritin Please obtain lipid panel Initiate IV NAC tomorrow pending the laboratories tomorrow (little data but little downside) -await am gi re recs   ETOH abuse  - notes in remission last drink 2 weeks ago   Elevated inr  -s/p vit k, repeat pending for am  -no current bleeding    GERD  -ppi  Anxiety  -resume ativan   DVT prophylaxis: scd Code Status: full Family Communication:   Lori,Georjean Toya (Spouse) [phone] (Mobile)   Disposition Plan: patient  expected to be admitted greater than 2 midnights  Consults called: Dr Rush Landmark Admission status: med tele   Clance Boll MD [facility]  If 7PM-7AM, please contact night-coverage [email or web address] Password [email or web address]  09/02/2022, 1:42 AM

## 2022-09-02 NOTE — ED Notes (Signed)
Patient transported to MRI 

## 2022-09-02 NOTE — Hospital Course (Addendum)
Jamie Coleman is a 51 yo female with PMH etoh hepatitis, cirrhosis, anxiety, HLD, GERD, prediabetes who presented upon referral from the GI office on 09/01/22. She had been under treatment for etoh hepatitis with prednisolone with some response, however TB began trending up as well as concern for decompensation, therefore she was referred to the ER for further workup and evaluation.  See below for further A&P

## 2022-09-02 NOTE — Assessment & Plan Note (Addendum)
-   Lipase 76 on admission - patient denies any N/V nor abdominal pain; MRCP notes edematous pancreas with peripancreatic fluid.  Clinically does not have pancreatitis.  Edematous findings possibly in the setting of tissue edema from hypoalbuminemia -Continue monitoring for any further developing signs of pancreatitis; no signs/symptoms of pancreatitis developed

## 2022-09-02 NOTE — Consult Note (Signed)
Consultation Note   Referring Provider:  [facility] PCP: Abner Greenspan, MD Primary Gastroenterologist: Justice Britain, MD Reason for consultation: liver disease  Hospital Day: 2  Assessment     # 51 yo female with recurrent, severe Etoh hepatitis and abdominal imaging suggesting cirrhosis with portal HTN. She was initially responding to steroids (started earlier this month) but now with worsening liver function. MELD 25. Worsening INR. Mentation is normal.  Rule out infectious process as cause of worsening liver function. CXR negative. Blood and urine cx are pending. She has had Improvement in INR 2.6 >> 1.6. after one dose of Vitamin K.    # Pancreatitis ? MRI suggests acute interstitial pancreatitis. Lipase 76. Clinically doesn't appear to have pancreatitis.   # Gallbladder wall thickening with pericholecystic fluid. Clinically doesn't appear to have acute cholecystitis.   # Rectal bleeding / hemorrhoids  # See PMH for additional medical problems  Plan   Need to rule out infectious process as cause for deterioration.  Awaiting blood and urine cx. Will contact IR about a diagnostic tap to rule out SBP though there may be insufficient fluid to tap. Holding off on antibiotics for now. WBC improving  Hold off on steroids until SBP ruled out ( if IR can get fluid).   Consulted pharmacy for initiation of N- acetylcysteine  Start daily lactulose 20 gms Hydrocortisone cream for hemorrhoids.  Will order second dose of IV Vit K.  Am cbc, cmet, inr.  For edema will give one dose of IV lasix 20 mg + K+ ( takes at home) Await echo results ( Transplant Center wanted echo done) Dr. Rush Landmark has been in contact with at least two Liver Transplant Centers in case condition doesn't improve / further deteriorates. Sounds like [facility] is willing to evaluate her. He had a long discussion today with patient and her husband about prognosis / risk  of mortality and possible need for liver transplant evaluation.  Will need a plan for permanent Etoh cessation  HPI   Jamie Coleman is a 51 y.o. female known to Dr. Rush Landmark with a past medical history significant for  Etoh abuse, Etoh hepatitis, obesity, anxiety, GERD, diverticulosis, anal fissure . See PMH for any additional medical problems.  Patient has been followed by [place] for Etoh liver disease.  She was seen in the office yesterday, 11/21 by Carl Best, NP, refer to that note for details. In summary Lakedra has a history of Etoh hepatitis requiring hospitalization in Dec 2022. In November of this year she contacted our office with concerns for jaundice. Labs obtained MDF was 56, she was started on Prednisolone. A week later her Lille score suggested a good prognosis, steroids were continued. At yesterday's follow up she was edematous, having right flank pain. Labs yesterday showed WBC of 17K, rising bilirubin, worsening INR. MELD Na as 31. She was sent to hospital for admission.   Significant studies:  Her INR has improved from 2.6 to 1.6 after a dose of vitamin K. WBC improved to 12.8.  Renal function has remained normal.  CT scan showing cirrhosis with portal HTN with paraesophageal, perigastric, and abdominopelvic venous collaterals, trace to small ascites MRI / MRCP - Edematous appearance of the pancreas with  peripancreatic fluid. Homogeneous postcontrast enhancement of the pancreas. Findings are favored to reflect acute interstitial pancreatitis. Mild splenomegaly. Recommend correlation with lipase. Gallbladder decompressed. Gallbladder wall thickening with pericholecystic fluid.  Jaclyne feels okay overall. She isn't having any abdominal pain today. She is emotional about gravity of condition. Husband is here with her.    Previous GI Evaluation     May 2023 colonoscopy  -Perianal skin tags found on perianal exam. - Hemorrhoids found on digital rectal exam. - The  examined portion of the ileum was normal. - Diverticulosis in the sigmoid colon, in the descending colon and in the transverse colon. - Two 3 to 4 mm polyps in the rectum and at the recto-sigmoid colon, removed with a cold snare. Resected and retrieved. - Normal mucosa in the entire examined colon otherwise. - Non-bleeding non-thrombosed external and internal hemorrhoids.  May 2023 EGD - No gross lesions in esophagus. Biopsied. - Z-line irregular, 35 cm from the incisors. - 1 cm hiatal hernia. - Erythematous mucosa in the stomach. Biopsied. - No gross lesions in the duodenal bulb, in the first portion of the duodenum and in the  Recent Labs and Imaging ECHOCARDIOGRAM COMPLETE  Result Date: 09/02/2022    ECHOCARDIOGRAM REPORT   Patient Name:   Jamie Coleman Date of Exam: 09/02/2022 Medical Rec #:  [id number]         Height:       61.5 in Accession #:    [id number]        Weight:       194.0 lb Date of Birth:  1970/11/01         BSA:          1.876 m Patient Age:    80 years          BP:           119/66 mmHg Patient Gender: F                 HR:           100 bpm. Exam Location:  Inpatient Procedure: 2D Echo Indications:    fluid overload  History:        Patient has prior history of Echocardiogram examinations, most                 recent 09/27/2013. Risk Factors:Dyslipidemia.  Sonographer:    Harvie Junior Referring Phys: [id number] SARA-MAIZ A THOMAS  Sonographer Comments: Image acquisition challenging due to patient body habitus. IMPRESSIONS  1. Left ventricular ejection fraction, by estimation, is 70 to 75%. The left ventricle has hyperdynamic function. The left ventricle has no regional wall motion abnormalities. Left ventricular diastolic parameters were normal.  2. Right ventricular systolic function is hyperdynamic. The right ventricular size is normal. Tricuspid regurgitation signal is inadequate for assessing PA pressure.  3. The mitral valve was not well visualized. No evidence of mitral  valve regurgitation. No evidence of mitral stenosis.  4. The aortic valve is tricuspid. Aortic valve regurgitation is not visualized. No aortic stenosis is present. Comparison(s): Unable to view prior study. FINDINGS  Left Ventricle: Left ventricular ejection fraction, by estimation, is 70 to 75%. The left ventricle has hyperdynamic function. The left ventricle has no regional wall motion abnormalities. The left ventricular internal cavity size was normal in size. There is no left ventricular hypertrophy. Left ventricular diastolic parameters were normal. Right Ventricle: The right ventricular size is normal. No increase in right ventricular wall thickness. Right ventricular systolic function  is hyperdynamic. Tricuspid regurgitation signal is inadequate for assessing PA pressure. Left Atrium: Left atrial size was normal in size. Right Atrium: Right atrial size was normal in size. Pericardium: There is no evidence of pericardial effusion. Presence of epicardial fat layer. Mitral Valve: The mitral valve was not well visualized. No evidence of mitral valve regurgitation. No evidence of mitral valve stenosis. Tricuspid Valve: The tricuspid valve is not well visualized. Tricuspid valve regurgitation is not demonstrated. No evidence of tricuspid stenosis. Aortic Valve: The aortic valve is tricuspid. Aortic valve regurgitation is not visualized. No aortic stenosis is present. Aortic valve mean gradient measures 6.0 mmHg. Aortic valve peak gradient measures 8.9 mmHg. Aortic valve area, by VTI measures 2.25 cm. Pulmonic Valve: The pulmonic valve was normal in structure. Pulmonic valve regurgitation is not visualized. No evidence of pulmonic stenosis. Aorta: The aortic root and ascending aorta are structurally normal, with no evidence of dilitation. IAS/Shunts: No atrial level shunt detected by color flow Doppler.  LEFT VENTRICLE PLAX 2D LVIDd:         4.60 cm      Diastology LVIDs:         3.20 cm      LV e' medial:    9.36  cm/s LV PW:         0.80 cm      LV E/e' medial:  11.2 LV IVS:        0.80 cm      LV e' lateral:   10.10 cm/s LVOT diam:     1.70 cm      LV E/e' lateral: 10.4 LV SV:         66 LV SV Index:   35 LVOT Area:     2.27 cm  LV Volumes (MOD) LV vol d, MOD A2C: 71.5 ml LV vol d, MOD A4C: 111.0 ml LV vol s, MOD A2C: 32.0 ml LV vol s, MOD A4C: 48.2 ml LV SV MOD A2C:     39.5 ml LV SV MOD A4C:     111.0 ml LV SV MOD BP:      51.8 ml RIGHT VENTRICLE RV Basal diam:  3.10 cm RV Mid diam:    2.50 cm RV S prime:     18.50 cm/s TAPSE (M-mode): 2.4 cm LEFT ATRIUM             Index        RIGHT ATRIUM           Index LA diam:        3.30 cm 1.76 cm/m   RA Area:     10.20 cm LA Vol (A2C):   33.9 ml 18.07 ml/m  RA Volume:   19.40 ml  10.34 ml/m LA Vol (A4C):   66.7 ml 35.56 ml/m LA Biplane Vol: 51.1 ml 27.24 ml/m  AORTIC VALVE                     PULMONIC VALVE AV Area (Vmax):    2.25 cm      PV Vmax:       1.07 m/s AV Area (Vmean):   2.25 cm      PV Peak grad:  4.6 mmHg AV Area (VTI):     2.25 cm AV Vmax:           149.00 cm/s AV Vmean:          113.000 cm/s AV VTI:  0.292 m AV Peak Grad:      8.9 mmHg AV Mean Grad:      6.0 mmHg LVOT Vmax:         148.00 cm/s LVOT Vmean:        112.000 cm/s LVOT VTI:          0.289 m LVOT/AV VTI ratio: 0.99  AORTA Ao Root diam: 3.00 cm Ao Asc diam:  2.70 cm MITRAL VALVE MV Area (PHT): 3.31 cm     SHUNTS MV Decel Time: 229 msec     Systemic VTI:  0.29 m MR Peak grad: 21.3 mmHg     Systemic Diam: 1.70 cm MR Vmax:      231.00 cm/s MV E velocity: 105.00 cm/s MV A velocity: 115.00 cm/s MV E/A ratio:  0.91 Rudean Haskell MD Electronically signed by Rudean Haskell MD Signature Date/Time: 09/02/2022/10:50:04 AM    Final    MR ABDOMEN MRCP W WO CONTAST  Result Date: 09/02/2022 CLINICAL DATA:  Jaundice history of alcohol associated hepatitis. EXAM: MRI ABDOMEN WITHOUT AND WITH CONTRAST (INCLUDING MRCP) TECHNIQUE: Multiplanar multisequence MR imaging of the abdomen was  performed both before and after the administration of intravenous contrast. Heavily T2-weighted images of the biliary and pancreatic ducts were obtained, and three-dimensional MRCP images were rendered by post processing. CONTRAST:  77m GADAVIST GADOBUTROL 1 MMOL/ML IV SOLN COMPARISON:  Multiple priors including CT abdomen and pelvis September 01, 2022. FINDINGS: Lower chest: No acute abnormality. Hepatobiliary: No significant hepatic steatosis. Cirrhotic hepatic morphology. No suspicious hepatic lesion. Gallbladder is decompressed. Gallbladder wall thickening with pericholecystic fluid. Pancreas: No pancreatic ductal dilation. Edematous appearance of the pancreas with peripancreatic fluid. Homogeneous postcontrast enhancement of the pancreas. Spleen: Mild splenomegaly measuring 14.1 cm in maximum axial dimension. Adrenals/Urinary Tract: Bilateral adrenal glands appear normal. No hydronephrosis. No solid enhancing renal mass. Stomach/Bowel: Mild wall thickening of the proximal duodenum as well as the ascending colon through the hepatic flexure. Vascular/Lymphatic: Normal caliber abdominal aorta. Smooth IVC contours. The portal, splenic and superior mesenteric veins are patent. Abdominal portosystemic collateral vessels including esophageal varices, gastric varices and recannulized paraumbilical vein. Prominent upper abdominal lymph nodes for instance a periportal lymph node measuring 9 mm. Other:  Trace abdominal free fluid. Musculoskeletal: No suspicious bone lesions identified. IMPRESSION: 1. Edematous appearance of the pancreas with peripancreatic fluid. Homogeneous postcontrast enhancement of the pancreas. Findings are favored to reflect acute interstitial pancreatitis. Recommend correlation with lipase. 2. Cirrhotic hepatic morphology with evidence of portal venous hypertension including portosystemic collateral vessels, trace ascites, mild splenomegaly and portal enterocolopathy. 3. No suspicious hepatic lesion  identified. Electronically Signed   By: JDahlia BailiffM.D.   On: 09/02/2022 08:50   MR 3D Recon At Scanner  Result Date: 09/02/2022 CLINICAL DATA:  Jaundice history of alcohol associated hepatitis. EXAM: MRI ABDOMEN WITHOUT AND WITH CONTRAST (INCLUDING MRCP) TECHNIQUE: Multiplanar multisequence MR imaging of the abdomen was performed both before and after the administration of intravenous contrast. Heavily T2-weighted images of the biliary and pancreatic ducts were obtained, and three-dimensional MRCP images were rendered by post processing. CONTRAST:  921mGADAVIST GADOBUTROL 1 MMOL/ML IV SOLN COMPARISON:  Multiple priors including CT abdomen and pelvis September 01, 2022. FINDINGS: Lower chest: No acute abnormality. Hepatobiliary: No significant hepatic steatosis. Cirrhotic hepatic morphology. No suspicious hepatic lesion. Gallbladder is decompressed. Gallbladder wall thickening with pericholecystic fluid. Pancreas: No pancreatic ductal dilation. Edematous appearance of the pancreas with peripancreatic fluid. Homogeneous postcontrast enhancement of the pancreas. Spleen: Mild splenomegaly measuring  14.1 cm in maximum axial dimension. Adrenals/Urinary Tract: Bilateral adrenal glands appear normal. No hydronephrosis. No solid enhancing renal mass. Stomach/Bowel: Mild wall thickening of the proximal duodenum as well as the ascending colon through the hepatic flexure. Vascular/Lymphatic: Normal caliber abdominal aorta. Smooth IVC contours. The portal, splenic and superior mesenteric veins are patent. Abdominal portosystemic collateral vessels including esophageal varices, gastric varices and recannulized paraumbilical vein. Prominent upper abdominal lymph nodes for instance a periportal lymph node measuring 9 mm. Other:  Trace abdominal free fluid. Musculoskeletal: No suspicious bone lesions identified. IMPRESSION: 1. Edematous appearance of the pancreas with peripancreatic fluid. Homogeneous postcontrast  enhancement of the pancreas. Findings are favored to reflect acute interstitial pancreatitis. Recommend correlation with lipase. 2. Cirrhotic hepatic morphology with evidence of portal venous hypertension including portosystemic collateral vessels, trace ascites, mild splenomegaly and portal enterocolopathy. 3. No suspicious hepatic lesion identified. Electronically Signed   By: Dahlia Bailiff M.D.   On: 09/02/2022 08:50   DG Chest Portable 1 View  Result Date: 09/01/2022 CLINICAL DATA:  Pulmonary edema and leg swelling. EXAM: PORTABLE CHEST 1 VIEW COMPARISON:  05/05/2022. FINDINGS: The heart size and mediastinal contours are within normal limits. No consolidation, effusion, or pneumothorax. No acute osseous abnormality. IMPRESSION: No active disease. Electronically Signed   By: Brett Fairy M.D.   On: 09/01/2022 21:09   CT ABDOMEN PELVIS W CONTRAST  Result Date: 09/01/2022 CLINICAL DATA:  Epigastric pain. Pt states she is in liver failure. States she had labs drawn this morning and was told to come to the hospital for admission. Pt reports nose bleeds, bleeding hemorrhoids, yellow skin. EXAM: CT ABDOMEN AND PELVIS WITH CONTRAST TECHNIQUE: Multidetector CT imaging of the abdomen and pelvis was performed using the standard protocol following bolus administration of intravenous contrast. RADIATION DOSE REDUCTION: This exam was performed according to the departmental dose-optimization program which includes automated exposure control, adjustment of the mA and/or kV according to patient size and/or use of iterative reconstruction technique. CONTRAST:  174m OMNIPAQUE IOHEXOL 300 MG/ML  SOLN COMPARISON:  Ultrasound abdomen 09/17/2021 FINDINGS: Lower chest: No acute abnormality. Hepatobiliary: Nodular hepatic contour. Enlarged left hepatic lobe. The liver is enlarged measuring up to 20 cm. No focal liver abnormality. Contracted gallbladder. No gallstones, gallbladder wall thickening, or pericholecystic fluid.  No biliary dilatation. Pancreas: No focal lesion. Normal pancreatic contour. No surrounding inflammatory changes. No main pancreatic ductal dilatation. Spleen: Normal in size without focal abnormality. Adrenals/Urinary Tract: No adrenal nodule bilaterally. Bilateral kidneys enhance symmetrically. No hydronephrosis. No hydroureter. The urinary bladder is unremarkable. On delayed imaging, there is no urothelial wall thickening and there are no filling defects in the opacified portions of the bilateral collecting systems or ureters. Stomach/Bowel: Stomach is within normal limits. No evidence of bowel wall thickening or dilatation. Appendix appears normal. Vascular/Lymphatic: Recanalized paraumbilical vein. Paraesophageal, perigastric, and abdominopelvic venous collaterals are noted. The main portal, splenic, superior mesenteric veins are patent. No abdominal aorta or iliac aneurysm. Mild atherosclerotic plaque of the aorta and its branches. No abdominal, pelvic, or inguinal lymphadenopathy. Reproductive: Uterus and bilateral adnexa are unremarkable. Other: Trace to small volume simple free fluid within the upper abdomen and within the pelvis. No intraperitoneal free gas. No organized fluid collection. Musculoskeletal: Subcutaneus soft tissue edema. No suspicious lytic or blastic osseous lesions. No acute displaced fracture. Multilevel degenerative changes of the spine. IMPRESSION: 1. Cirrhosis with portal hypertension. No focal liver lesions identified. Please note that liver protocol enhanced MR and CT are the most sensitive tests for  the screening detection of hepatocellular carcinoma in the high risk setting of cirrhosis. 2. Trace to small volume simple free fluid ascites. 3.  Aortic Atherosclerosis (ICD10-I70.0). Electronically Signed   By: Iven Finn M.D.   On: 09/01/2022 20:10   US Abdomen Complete  Result Date: 08/25/2022 CLINICAL DATA:  Epigastric pain.  Alcoholic cirrhosis. EXAM: ABDOMEN ULTRASOUND  COMPLETE COMPARISON:  Ultrasound abdomen 09/17/2021 FINDINGS: Gallbladder: Gallbladder is contracted with mild wall thickening measuring up to 6 mm. No cholelithiasis. Negative sonographic Murphy's sign. No pericholecystic fluid. Common bile duct: Diameter: 3 mm Liver: Coarsened echogenicity and nodular contour. No focal lesion. Mixed flow is demonstrated within the portal vein, which is poorly evaluated. IVC: No abnormality visualized. Pancreas: Not well visualized due to overlying bowel gas. Spleen: Size and appearance within normal limits. Right Kidney: Length: 14 cm. Echogenicity within normal limits. No mass or hydronephrosis visualized. Left Kidney: Length: 10 cm. Echogenicity within normal limits. No mass or hydronephrosis visualized. Abdominal aorta: No aneurysm visualized. Other findings: None. IMPRESSION: 1. Cirrhotic morphology of the liver. No focal hepatic lesion. 2. Gallbladder is contracted with mild wall thickening, nonspecific, potentially secondary to cirrhosis. Consider clinical and laboratory correlation. No cholelithiasis. 3. Mixed directional flow demonstrated within the portal vein, which is poorly evaluated. Electronically Signed   By: Lovey Newcomer M.D.   On: 08/25/2022 12:34    Labs:  Recent Labs    09/01/22 0944 09/01/22 1845 09/02/22 0316  WBC 17.0 Repeated and verified X2.* 21.5* 12.8*  HGB 12.9 13.8 12.2  HCT 37.5 38.8 35.0*  PLT 257.0 279 210   Recent Labs    09/01/22 0944 09/01/22 1845 09/02/22 0316  NA 128* 134* 135  K 4.6 4.2 3.7  CL 98 100 101  CO2 '23 25 25  '$ GLUCOSE 168* 130* 101*  BUN '17 16 17  '$ CREATININE 0.67 0.46 0.43*  CALCIUM 8.0* 8.6* 8.3*   Recent Labs    09/01/22 0944 09/01/22 1845 09/02/22 0316  PROT 5.9*  5.9*   < > 6.5  ALBUMIN 2.9*  2.9*   < > 2.5*  AST 110*  110*   < > 113*  ALT 62*  62*   < > 70*  ALKPHOS 206*  206*   < > 192*  BILITOT 16.8*  16.8*   < > 17.3*  BILIDIR 13.8*  --   --    < > = values in this interval not  displayed.   No results for input(s): "HEPBSAG", "HCVAB", "HEPAIGM", "HEPBIGM" in the last 72 hours. Recent Labs    09/01/22 1845 09/02/22 0316  LABPROT 20.0* 18.9*  INR 1.7* 1.6*    Past Medical History:  Diagnosis Date   Allergy    allergic rhinitis   Anxiety    situational   COVID 05/2021   Difficult intubation    states 2012 ablation surgery, had diff intubation,glide scope used, not noted in EPIC anesthesia record   Elevated hemoglobin A1c 08/08/2015   Elevated liver function tests 08/08/2015   GERD (gastroesophageal reflux disease)    Hyperlipidemia    Low serum vitamin D 08/08/2015   Status post endometrial ablation    Tennis elbow    currently in PT-10/16    Past Surgical History:  Procedure Laterality Date   BIOPSY  02/09/2022   Procedure: BIOPSY;  Surgeon: Irving Copas., MD;  Location: [facility];  Service: Gastroenterology;;   CESAREAN SECTION     COLONOSCOPY WITH PROPOFOL N/A 02/09/2022   Procedure: COLONOSCOPY WITH PROPOFOL;  Surgeon: Irving Copas., MD;  Location: [facility];  Service: Gastroenterology;  Laterality: N/A;   ENDOMETRIAL ABLATION     8/12   ESOPHAGOGASTRODUODENOSCOPY (EGD) WITH PROPOFOL N/A 02/09/2022   Procedure: ESOPHAGOGASTRODUODENOSCOPY (EGD) WITH PROPOFOL;  Surgeon: Rush Landmark Telford Nab., MD;  Location: [facility];  Service: Gastroenterology;  Laterality: N/A;   EXTREMITY WIRE/PIN REMOVAL Left 07/16/2016   Procedure: PIN REMOVAL OF LEFT WRIST X 2;  Surgeon: Roseanne Kaufman, MD;  Location: [facility];  Service: Orthopedics;  Laterality: Left;   FOOT SURGERY Left    LAPAROSCOPIC TUBAL LIGATION  06/09/2011   Procedure: LAPAROSCOPIC TUBAL LIGATION;  Surgeon: Arloa Koh;  Location: [facility];  Service: Gynecology;  Laterality: N/A;   LIGAMENT REPAIR Left 05/08/2016   Procedure: LEFT WRIST SCAPHOLUNATE LIGAMENT RECONSTRUCTION WITH TENDON GRAFT PIN NEURECTOMY AND REPAIR;  Surgeon: Roseanne Kaufman, MD;   Location: [facility];  Service: Orthopedics;  Laterality: Left;   POLYPECTOMY  02/09/2022   Procedure: POLYPECTOMY;  Surgeon: Mansouraty, Telford Nab., MD;  Location: [facility];  Service: Gastroenterology;;   TONSILLECTOMY     as child    Family History  Problem Relation Age of Onset   Cancer Mother        CA insitu of appendix   Heart disease Father        CAD   Diabetes Father        type II   Alzheimer's disease Father    Colon polyps Father    Diabetes Brother        type II   Diabetes Maternal Grandfather    Diabetes Paternal Grandmother    Diabetes Paternal Grandfather    Breast cancer Neg Hx    Colon cancer Neg Hx    Stomach cancer Neg Hx    Esophageal cancer Neg Hx    Pancreatic cancer Neg Hx     Prior to Admission medications   Medication Sig Start Date End Date Taking? Authorizing Provider  Ascorbic Acid (VITAMIN C) 1000 MG tablet Take 1,000 mg by mouth daily.   Yes [provider]  Brimonidine Tartrate (LUMIFY) 0.025 % SOLN Place 1 drop into both eyes as needed (brighten eyes).   Yes [provider]  Calcium Citrate-Vitamin D (CALCIUM + D PO) Take 1 tablet by mouth daily.   Yes [provider]  Cholecalciferol (VITAMIN D) 50 MCG (2000 UT) CAPS Take 2,000 Units by mouth daily.   Yes [provider]  fexofenadine (ALLEGRA) 180 MG tablet Take 180 mg by mouth daily as needed for allergies.   Yes [provider]  folic acid (FOLVITE) 1 MG tablet Take 1 mg by mouth daily.   Yes [provider]  hydrocortisone (ANUSOL-HC) 25 MG suppository Place 1 suppository (25 mg total) rectally at bedtime as needed for hemorrhoids or anal itching. Patient taking differently: Place 25 mg rectally daily as needed for hemorrhoids or anal itching. 02/09/22 02/09/23 Yes Mansouraty, Telford Nab., MD  Multiple Vitamin (MULTIVITAMIN) capsule Take 1 capsule by mouth daily.     Yes [provider]  omeprazole (PRILOSEC) 20  MG capsule Take 20 mg by mouth daily.   Yes [provider]  Polyvinyl Alcohol-Povidone (REFRESH OP) Place 1-2 drops into both eyes 4 (four) times daily as needed (dry eye).   Yes [provider]  potassium chloride SA (KLOR-CON M) 20 MEQ tablet Take 1 tablet (20 mEq total) by mouth daily for 7 doses. 08/26/22 09/02/22 Yes Mansouraty, Telford Nab.,  MD  prednisoLONE (ORAPRED) 15 MG/5ML solution Take 13.3 mLs (40 mg total) by mouth daily before breakfast. 08/26/22 09/25/22 Yes Mansouraty, Telford Nab., MD  Simethicone 250 MG CAPS Take 250 mg by mouth 2 (two) times daily as needed (flatulence).   Yes [provider]  thiamine 500 MG tablet Take 500 mg by mouth daily.   Yes [provider]  UNABLE TO FIND Apply 1 Application topically as needed (hemorrhoids). Med Name: Diltiazem 2% Lidocaine 2% ointment   Yes [provider]  escitalopram (LEXAPRO) 10 MG tablet Take 1 tablet (10 mg total) by mouth daily. Patient not taking: Reported on 09/02/2022 08/28/22   Tower, Wynelle Fanny, MD  LORazepam (ATIVAN) 1 MG tablet Take 0.5-1 tablets (0.5-1 mg total) by mouth 2 (two) times daily as needed for anxiety or sleep. Patient not taking: Reported on 09/02/2022 09/01/22   Abner Greenspan, MD    Current Facility-Administered Medications  Medication Dose Route Frequency Provider Last Rate Last Admin   acetylcysteine (ACETADOTE) 30.5 mg/mL load via infusion 13,200 mg  150 mg/kg Intravenous Once Shade, Christine E, RPH       Followed by   acetylcysteine (ACETADOTE) 18,000 mg in dextrose 5 % 590 mL (30.5085 mg/mL) infusion  12.5 mg/kg/hr Intravenous Continuous Shade, Christine E, RPH       Followed by   acetylcysteine (ACETADOTE) 18,000 mg in dextrose 5 % 590 mL (30.5085 mg/mL) infusion  6.25 mg/kg/hr Intravenous Continuous Shade, Christine E, RPH       albuterol (PROVENTIL) (2.5 MG/3ML) 0.083% nebulizer solution 2.5 mg  2.5 mg Nebulization Q2H PRN Clance Boll, MD        hydrocortisone (ANUSOL-HC) 2.5 % rectal cream   Rectal Daily Willia Craze, NP       lactulose (CHRONULAC) 10 GM/15ML solution 20 g  20 g Oral Daily Tye Savoy M, NP       LORazepam (ATIVAN) tablet 0.5-1 mg  0.5-1 mg Oral BID PRN Myles Rosenthal A, MD   0.5 mg at 09/02/22 0107   ondansetron (ZOFRAN) tablet 4 mg  4 mg Oral Q6H PRN Clance Boll, MD       Or   ondansetron [facility]) injection 4 mg  4 mg Intravenous Q6H PRN Clance Boll, MD       phytonadione (VITAMIN K) 10 mg in dextrose 5 % 50 mL IVPB  10 mg Intravenous Once Willia Craze, NP 50 mL/hr at 09/02/22 1236 10 mg at 09/02/22 1236    Allergies as of 09/01/2022 - Review Complete 09/01/2022  Allergen Reaction Noted   Lipitor [atorvastatin]  08/07/2013    Social History   Socioeconomic History   Marital status: Married    Spouse name: Not on file   Number of children: 2   Years of education: Not on file   Highest education level: Not on file  Occupational History   Occupation: Nurse  Tobacco Use   Smoking status: Former    Types: Cigarettes    Quit date: 07/25/1983    Years since quitting: 39.1   Smokeless tobacco: Never   Tobacco comments:    in high school  Vaping Use   Vaping Use: Never used  Substance and Sexual Activity   Alcohol use: Not Currently    Alcohol/week: 14.0 standard drinks of alcohol    Types: 14 Glasses of wine per week    Comment: 6 shots daily 12 shots daily on weekends   Drug use: No   Sexual  activity: Yes    Partners: Male    Birth control/protection: Surgical    Comment: Tubal ligation  Other Topics Concern   Not on file  Social History Narrative   Not on file   Social Determinants of Health   Financial Resource Strain: Not on file  Food Insecurity: Not on file  Transportation Needs: Not on file  Physical Activity: Not on file  Stress: Not on file  Social Connections: Not on file  Intimate Partner Violence: Not on file    Review of Systems: All  systems reviewed and negative except where noted in HPI.  Physical Exam: Vital signs in last 24 hours: Temp:  [97.8 F (36.6 C)-98.9 F (37.2 C)] 97.8 F (36.6 C) (11/22 1229) Pulse Rate:  [78-106] 97 (11/22 1229) Resp:  [16-18] 18 (11/22 1229) BP: (100-136)/(60-87) 107/63 (11/22 1229) SpO2:  [93 %-100 %] 96 % (11/22 1229) Weight:  [88 kg] 88 kg (11/21 1826)    General:  Alert female in NAD Psych:  Pleasant, cooperative. Normal mood and affect Eyes: Pupils equal, scleral icterus Ears:  Normal auditory acuity Nose: No deformity, discharge or lesions Neck:  Supple, no masses felt Lungs:  Clear to auscultation.  Heart:  Regular rate, regular rhythm. No lower extremity edema Abdomen:  Soft, nondistended, nontender, active bowel sounds, no masses felt Rectal :  Deferred Msk: Symmetrical without gross deformities.  Neurologic:  Alert, oriented, grossly normal neurologically Skin:  Deeply jaundiced  Intake/Output from previous day: No intake/output data recorded. Intake/Output this shift:  No intake/output data recorded.    Principal Problem:   Liver failure ([place])    Tye Savoy, NP-C @  09/02/2022, 1:00 PM

## 2022-09-02 NOTE — Telephone Encounter (Signed)
The notes indicate you have been admitted - sounds like they are waiting for a bed  I am currently out of town but will send this message back to myself so I can follow your progress  Elbert Ewings in there!

## 2022-09-02 NOTE — Telephone Encounter (Signed)
Pt sent a mychart message saying:    They tried to direct admit me today but both hospitals were ful so suggested sitting in the ed because I might get a bed faster. It's been a long wait and going to be much longer. Gershon Mussel is here with me though

## 2022-09-02 NOTE — Progress Notes (Signed)
MEDICATION RELATED CONSULT NOTE - INITIAL   Pharmacy Consult for N-acetylcysteine Indication: Liver failure  Allergies  Allergen Reactions   Lipitor [Atorvastatin] Other (See Comments)    myalgias    Patient Measurements: Height: 5' 1.5" (156.2 cm) Weight: 88 kg (194 lb) IBW/kg (Calculated) : 48.95 Adjusted Body Weight:   Vital Signs: Temp: 98.5 F (36.9 C) (11/22 0922) Temp Source: Oral (11/22 0922) BP: 108/66 (11/22 0922) Pulse Rate: 106 (11/22 0922) Intake/Output from previous day: No intake/output data recorded. Intake/Output from this shift: No intake/output data recorded.  Labs: Recent Labs    09/01/22 0944 09/01/22 1845 09/02/22 0316  WBC 17.0 Repeated and verified X2.* 21.5* 12.8*  HGB 12.9 13.8 12.2  HCT 37.5 38.8 35.0*  PLT 257.0 279 210  CREATININE 0.67 0.46 0.43*  ALBUMIN 2.9*  2.9* 2.5* 2.5*  PROT 5.9*  5.9* 7.1 6.5  AST 110*  110* 125* 113*  ALT 62*  62* 73* 70*  ALKPHOS 206*  206* 211* 192*  BILITOT 16.8*  16.8* 19.6* 17.3*  BILIDIR 13.8*  --   --    Estimated Creatinine Clearance: 83.9 mL/min (A) (by C-G formula based on SCr of 0.43 mg/dL (L)).   Medical History: Past Medical History:  Diagnosis Date   Allergy    allergic rhinitis   Anxiety    situational   COVID 05/2021   Difficult intubation    states 2012 ablation surgery, had diff intubation,glide scope used, not noted in EPIC anesthesia record   Elevated hemoglobin A1c 08/08/2015   Elevated liver function tests 08/08/2015   GERD (gastroesophageal reflux disease)    Hyperlipidemia    Low serum vitamin D 08/08/2015   Status post endometrial ablation    Tennis elbow    currently in PT-10/16    Medications:  Scheduled:   hydrocortisone   Rectal Daily   lactulose  20 g Oral Daily   Infusions:   phytonadione (VITAMIN K) 10 mg in dextrose 5 % 50 mL IVPB      Assessment: 63 yoF admitted on 11/22 and Pharmacy is consulted to dose N-acetylcysteine for non-acetaminophen  liver failure.   Plan:  N-Acetylcysteine 150 mg IV bolus x1 N-Acetylcysteine 12.5 mg/kg/hr x 4 hours N-Acetylcysteine 6.25 mg/kg/hr x 67 hours  Pharmacy will sign off at this time.  Please reconsult if a change in clinical status warrants re-evaluation of dosage.   Gretta Arab PharmD, BCPS [facility] main pharmacy [phone] 09/02/2022 12:10 PM

## 2022-09-02 NOTE — Assessment & Plan Note (Addendum)
-   small volume ascites noted on imaging; likely not enough for paracentesis; edema mostly present in LE and abdomen - continue trending LFTs; downtrending - continue lactulose  - see ALF -Portal venous hypertension appreciated on MRCP.  Portosystemic collateral vessels noted.  Patient has undergone EGD and colonoscopy May 2023 - edema responding to diuretics and renal function tolerating - remains on spironolactone '100mg'$  daily; will plan on starting bumex 1 mg daily on 11/27 (equiv to lasix 40 PO) which will maintain 5:2 ratio -Potassium remaining slightly low requiring replacement during hospitalization therefore continued on K-Dur 20 mEq daily at discharge

## 2022-09-02 NOTE — Assessment & Plan Note (Addendum)
-   TSH 6.162, FT4 1.1 (normal range) - TSH likely elevated in setting of acute illness - repeat TSH in 4-6 week or once more medically stable

## 2022-09-02 NOTE — Assessment & Plan Note (Addendum)
-   previously treated last hospitalization with steroids with good response per [facility] model; however repeat MDF up to 84 points on admission  - continue thiamine, folic, MVI - prednisolone resumed per GI; 30-day course (total, including start date of 11/9 with prednisolone; was on prednisone prior to prednisolone) to continue at discharge.  Orapred prescribed

## 2022-09-02 NOTE — Progress Notes (Signed)
  Echocardiogram 2D Echocardiogram has been performed.  Jamie Coleman 09/02/2022, 9:23 AM

## 2022-09-02 NOTE — Progress Notes (Signed)
Initial Nutrition Assessment  DOCUMENTATION CODES:   Not applicable  INTERVENTION:  - Advance to 2g Sodium diet once medically appropriate.  - Ensure Max po BID, each supplement provides 150 kcal and 30 grams of protein.  - Encourage intake of small and frequent meals, emphasizing protein rich food sources. - Recommend daily multivitamin with minerals, thiamine, and folic acid due to history of alcohol use disorder. - Recommend checking vitamin D level - last checked 2016 and low at that time. - Trend weights.   NUTRITION DIAGNOSIS:  Increased nutrient needs related to chronic illness (liver disease) as evidenced by estimated needs.  GOAL:  Patient will meet greater than or equal to 90% of their needs  MONITOR:   PO intake, Supplement acceptance, Weight trends, Labs  REASON FOR ASSESSMENT:   Consult Assessment of nutrition requirement/status  ASSESSMENT:   51 y.o. female with medical history significant of obesity, anxiety, alcohol use disorder, alcohol associated hepatitis, GERD, diverticulosis and rectosigmoid hyperplastic polyps who presents in referral from gi clinic after patient noted to have progressive liver failure.  Met with patient and family at bedside this afternoon. Patient reports a UBW of 175# but notes she has had weight gain from fluid over the past 3 weeks. Chart review confirms reported weight history.   Prior to issues with fluid and feeling poorly, patients food recall was as below: Breakfast: Premier Protein or a protein bar  Lunch: pizza of chick-fil-a sandwich from her work's Oncologist: home made meal of a protein and vegetable  Over the past 2 weeks,food recall as below: Snacks throughout the day Premier Protein Occasionally an egg with cheese, no salt or butter added in cooking Gatorade Cottage cheese Smoothie with fruits and spinach  Patient reports she has never eaten a lot at once or had a great appetite but over the past 3 weeks  has had further decreased appetite and has been experiencing early satiety.  Patient notes she has previously eaten a lot of processed foods but has tried to cut back on this the past few weeks. She also regularly uses a salt shaker on foods.  Discussed increased calorie and protein demand with liver disease. Recommended small and frequent meals throughout the day to combat early satiety. Reviewed importance of restricting processed foods and to avoid adding extra salt to foods. Patient endorsed understanding of information and appeared motivated to make changes. Patient currently on a full liquid diet but would like to advance if able (per pt no longer planned for paracentesis today).   Vitamin B12 checked 11/22 and above normal range.  Vitamin D last checked in 2016 but noted to be low at that time, recommend rechecking for updated value.  Medications reviewed and include: Lactulose, Zofran prn  Labs reviewed:  Alk Phos 192 AST 113 ALT 70   NUTRITION - FOCUSED PHYSICAL EXAM:  Flowsheet Row Most Recent Value  Orbital Region No depletion  Upper Arm Region Mild depletion  Thoracic and Lumbar Region No depletion  Buccal Region No depletion  Temple Region Mild depletion  Clavicle Bone Region No depletion  Clavicle and Acromion Bone Region No depletion  Scapular Bone Region Unable to assess  Dorsal Hand No depletion  Patellar Region No depletion  [noteable edema]  Anterior Thigh Region No depletion  [noteable edema]  Posterior Calf Region No depletion  [noteable edema]  Edema (RD Assessment) Moderate  [RLE and LLE]  Hair Reviewed  Eyes Reviewed  [jandice sclera]  Mouth Reviewed  Skin Reviewed  [  jaundice present]  Nails Reviewed       Diet Order:   Diet Order             Diet full liquid Room service appropriate? Yes; Fluid consistency: Thin  Diet effective now                   EDUCATION NEEDS:  Education needs have been addressed  Skin:  Skin Assessment: Reviewed  RN Assessment  Last BM:  11/21  Height:  Ht Readings from Last 1 Encounters:  09/01/22 5' 1.5" (1.562 m)   Weight:  Wt Readings from Last 1 Encounters:  09/01/22 88 kg  Estimated dry weight/UBW: 79.5kg   BMI:  Body mass index is 36.06 kg/m.  Estimated Nutritional Needs:  Kcal:  2122-4825 kcals Protein:  90-115 grams Fluid:  >/= 2.2L    Samson Frederic RD, LDN For contact information, refer to [facility].

## 2022-09-03 DIAGNOSIS — R600 Localized edema: Secondary | ICD-10-CM

## 2022-09-03 DIAGNOSIS — K7010 Alcoholic hepatitis without ascites: Secondary | ICD-10-CM | POA: Diagnosis not present

## 2022-09-03 DIAGNOSIS — K7200 Acute and subacute hepatic failure without coma: Secondary | ICD-10-CM | POA: Diagnosis not present

## 2022-09-03 DIAGNOSIS — K7030 Alcoholic cirrhosis of liver without ascites: Secondary | ICD-10-CM | POA: Diagnosis not present

## 2022-09-03 DIAGNOSIS — F413 Other mixed anxiety disorders: Secondary | ICD-10-CM | POA: Diagnosis not present

## 2022-09-03 LAB — CBC WITH DIFFERENTIAL/PLATELET
Abs Immature Granulocytes: 0.18 10*3/uL — ABNORMAL HIGH (ref 0.00–0.07)
Basophils Absolute: 0.1 10*3/uL (ref 0.0–0.1)
Basophils Relative: 0 %
Eosinophils Absolute: 0 10*3/uL (ref 0.0–0.5)
Eosinophils Relative: 0 %
HCT: 40.5 % (ref 36.0–46.0)
Hemoglobin: 14.1 g/dL (ref 12.0–15.0)
Immature Granulocytes: 1 %
Lymphocytes Relative: 4 %
Lymphs Abs: 0.7 10*3/uL (ref 0.7–4.0)
MCH: 36.1 pg — ABNORMAL HIGH (ref 26.0–34.0)
MCHC: 34.8 g/dL (ref 30.0–36.0)
MCV: 103.6 fL — ABNORMAL HIGH (ref 80.0–100.0)
Monocytes Absolute: 0.5 10*3/uL (ref 0.1–1.0)
Monocytes Relative: 3 %
Neutro Abs: 15 10*3/uL — ABNORMAL HIGH (ref 1.7–7.7)
Neutrophils Relative %: 92 %
Platelets: 220 10*3/uL (ref 150–400)
RBC: 3.91 MIL/uL (ref 3.87–5.11)
RDW: 18.1 % — ABNORMAL HIGH (ref 11.5–15.5)
WBC: 16.5 10*3/uL — ABNORMAL HIGH (ref 4.0–10.5)
nRBC: 0 % (ref 0.0–0.2)

## 2022-09-03 LAB — COMPREHENSIVE METABOLIC PANEL
ALT: 82 U/L — ABNORMAL HIGH (ref 0–44)
AST: 158 U/L — ABNORMAL HIGH (ref 15–41)
Albumin: 2.4 g/dL — ABNORMAL LOW (ref 3.5–5.0)
Alkaline Phosphatase: 184 U/L — ABNORMAL HIGH (ref 38–126)
Anion gap: 9 (ref 5–15)
BUN: 11 mg/dL (ref 6–20)
CO2: 24 mmol/L (ref 22–32)
Calcium: 8.2 mg/dL — ABNORMAL LOW (ref 8.9–10.3)
Chloride: 101 mmol/L (ref 98–111)
Creatinine, Ser: 0.5 mg/dL (ref 0.44–1.00)
GFR, Estimated: >60 mL/min (ref >60)
Glucose, Bld: 134 mg/dL — ABNORMAL HIGH (ref 70–99)
Potassium: 3.6 mmol/L (ref 3.5–5.1)
Sodium: 134 mmol/L — ABNORMAL LOW (ref 135–145)
Total Bilirubin: 19.1 mg/dL (ref 0.3–1.2)
Total Protein: 6.4 g/dL — ABNORMAL LOW (ref 6.5–8.1)

## 2022-09-03 LAB — URINE CULTURE: Culture: 20000 — AB

## 2022-09-03 LAB — MAGNESIUM: Magnesium: 2.7 mg/dL — ABNORMAL HIGH (ref 1.7–2.4)

## 2022-09-03 LAB — PROTIME-INR
INR: 2 — ABNORMAL HIGH (ref 0.8–1.2)
Prothrombin Time: 22.1 seconds — ABNORMAL HIGH (ref 11.4–15.2)

## 2022-09-03 LAB — SEDIMENTATION RATE: Sed Rate: 55 mm/hr — ABNORMAL HIGH (ref 0–22)

## 2022-09-03 LAB — C-REACTIVE PROTEIN: CRP: 20.1 mg/dL — ABNORMAL HIGH (ref <1.0)

## 2022-09-03 MED ORDER — SPIRONOLACTONE 25 MG PO TABS
50.0000 mg | ORAL_TABLET | Freq: Every day | ORAL | Status: DC
  Administered 2022-09-03 – 2022-09-05 (×3): 50 mg via ORAL
  Filled 2022-09-03 (×3): qty 2

## 2022-09-03 MED ORDER — VITAMIN K1 10 MG/ML IJ SOLN
10.0000 mg | Freq: Once | INTRAVENOUS | Status: AC
  Administered 2022-09-03: 10 mg via INTRAVENOUS
  Filled 2022-09-03: qty 1

## 2022-09-03 MED ORDER — ALPRAZOLAM 0.5 MG PO TABS
0.5000 mg | ORAL_TABLET | Freq: Two times a day (BID) | ORAL | Status: DC | PRN
  Administered 2022-09-04 – 2022-09-06 (×4): 0.5 mg via ORAL
  Filled 2022-09-03 (×5): qty 1

## 2022-09-03 MED ORDER — MULTIVITAMINS PO CAPS: 1.0000 | ORAL_CAPSULE | Freq: Every day | ORAL | Status: DC

## 2022-09-03 MED ORDER — ADULT MULTIVITAMIN W/MINERALS CH
1.0000 | ORAL_TABLET | Freq: Every day | ORAL | Status: DC
  Administered 2022-09-03 – 2022-09-07 (×5): 1 via ORAL
  Filled 2022-09-03 (×5): qty 1

## 2022-09-03 MED ORDER — PANTOPRAZOLE SODIUM 40 MG PO TBEC
40.0000 mg | DELAYED_RELEASE_TABLET | Freq: Every day | ORAL | Status: DC
  Administered 2022-09-03 – 2022-09-07 (×5): 40 mg via ORAL
  Filled 2022-09-03 (×5): qty 1

## 2022-09-03 MED ORDER — THIAMINE MONONITRATE 100 MG PO TABS
500.0000 mg | ORAL_TABLET | Freq: Every day | ORAL | Status: DC
  Administered 2022-09-03 – 2022-09-07 (×5): 500 mg via ORAL
  Filled 2022-09-03 (×6): qty 5

## 2022-09-03 MED ORDER — VITAMIN C 500 MG PO TABS
1000.0000 mg | ORAL_TABLET | Freq: Every day | ORAL | Status: DC
  Administered 2022-09-03 – 2022-09-07 (×5): 1000 mg via ORAL
  Filled 2022-09-03 (×5): qty 2

## 2022-09-03 MED ORDER — SIMETHICONE 80 MG PO CHEW: 240.0000 mg | CHEWABLE_TABLET | Freq: Two times a day (BID) | ORAL | Status: DC | PRN

## 2022-09-03 MED ORDER — FUROSEMIDE 10 MG/ML IJ SOLN
20.0000 mg | Freq: Every day | INTRAMUSCULAR | Status: DC
  Administered 2022-09-03 – 2022-09-05 (×3): 20 mg via INTRAVENOUS
  Filled 2022-09-03 (×3): qty 2

## 2022-09-03 MED ORDER — FOLIC ACID 1 MG PO TABS
1.0000 mg | ORAL_TABLET | Freq: Every day | ORAL | Status: DC
  Administered 2022-09-03 – 2022-09-07 (×5): 1 mg via ORAL
  Filled 2022-09-03 (×5): qty 1

## 2022-09-03 NOTE — Progress Notes (Signed)
Progress Note    Jamie Coleman   DZH:[id number]  DOB: 03-16-1971  DOA: 09/01/2022     2 PCP: Abner Greenspan, MD  Initial CC: worsening liver function   Hospital Course: Jamie Coleman is a 51 yo female with PMH etoh hepatitis, cirrhosis, anxiety, HLD, GERD, prediabetes who presented upon referral from the GI office on 09/01/22. She had been under treatment for etoh hepatitis with prednisolone with some response, however TB began trending up as well as concern for decompensation, therefore she was referred to the ER for further workup and evaluation.   Interval History:  No events overnight; slightly restless and no relief from ativan. Reviewed med changes today.  Okay for patient to shower and walk in gardens with husband assistance.   Assessment and Plan: * Acute liver failure without hepatic coma - on admission TB 16.8, PT/INR 26.7/2.6. AST/ALT 110/62, ALP 206 - s/p 10 mg Vit K on 11/21 followed by repeat 10 mg on 11/22 - trend PT/INR - prior hx etoh use; has been abstinent since ~08/17/22 - normal mentation; NH3 also normal on admission 29 - NAC initiated per GI given progressive hepatic dysfunction despite prior treatment - possible evaluation for transplant pending GI discussions with transplant centers; tentatively planning for close outpatient followup with Jamie Coleman unless were to further decompensate requiring inpatient transfer - followup alpha1AT and ceruloplasmin - trend coags and LFTs -Lactulose started per GI.  If develops encephalopathy, will trend ammonia (has remained lucid)   Alcoholic hepatitis - previously treated last hospitalization with steroids with good response per [facility] model; however repeat MDF up to 84 points on admission  - continue thiamine, folic, MVI - prednisolone resumed per GI  Cirrhosis, alcoholic (HCC) - small volume ascites noted on imaging; likely not enough for paracentesis  - continue trending LFTs - continue lactulose  - see  ALF -Portal venous hypertension appreciated on MRCP.  Portosystemic collateral vessels noted.  Patient has undergone EGD and colonoscopy May 2023 - given generalized edema, will start spironolactone and lasix (IV lasix given risk of gut edema)  Elevated lipase - Lipase 76 on admission - patient denies any N/V nor abdominal pain; MRCP notes edematous pancreas with peripancreatic fluid.  Clinically does not have pancreatitis.  Edematous findings possibly in the setting of tissue edema from hypoalbuminemia -Continue monitoring for any further developing signs of pancreatitis  Macrocytosis without anemia - MCV 104 - B12 1,940 on 11/21  Subclinical hypothyroidism - TSH 6.162, FT4 1.1 (normal range) - TSH likely elevated in setting of acute illness - repeat TSH in 4-6 week or once more medically stable  Anxiety disorder - patient request to change to xanax as ativan caused her to feel anxious  - caution for oversedation    Old records reviewed in assessment of this patient  Antimicrobials:   DVT prophylaxis:  SCDs Start: 09/02/22 0134   Code Status:   Code Status: Full Code  Mobility Assessment (last 72 hours)     Mobility Assessment     Row Name 09/03/22 1030 09/02/22 2030 09/02/22 1230       Does patient have an order for bedrest or is patient medically unstable No - Continue assessment No - Continue assessment No - Continue assessment     What is the highest level of mobility based on the progressive mobility assessment? Level 6 (Walks independently in room and hall) - Balance while walking in room without assist - Complete Level 6 (Walks independently in room and hall) -  Balance while walking in room without assist - Complete Level 5 (Walks with assist in room/hall) - Balance while stepping forward/back and can walk in room with assist - Complete              Barriers to discharge:  Disposition Plan:  Home vs tx to transplant center Status is:  Inpt  Objective: Blood pressure 128/79, pulse 95, temperature 98 F (36.7 C), temperature source Oral, resp. rate 19, height 5' 1.5" (1.562 m), weight 87.5 kg, SpO2 98 %.  Examination:  Physical Exam Constitutional:      General: She is not in acute distress.    Appearance: Normal appearance.  HENT:     Head: Normocephalic and atraumatic.     Mouth/Throat:     Mouth: Mucous membranes are moist.  Eyes:     General: Scleral icterus present.     Extraocular Movements: Extraocular movements intact.  Cardiovascular:     Rate and Rhythm: Normal rate and regular rhythm.  Pulmonary:     Effort: Pulmonary effort is normal.     Breath sounds: Normal breath sounds.  Abdominal:     General: Bowel sounds are normal. There is distension.     Palpations: Abdomen is soft.     Tenderness: There is no abdominal tenderness. There is no guarding.  Musculoskeletal:        General: Swelling present. Normal range of motion.     Cervical back: Normal range of motion and neck supple.  Skin:    General: Skin is warm.     Coloration: Skin is jaundiced.  Neurological:     General: No focal deficit present.     Mental Status: She is alert and oriented to person, place, and time.     Comments: No tremor or asterixis appreciated  Psychiatric:        Mood and Affect: Mood normal.        Behavior: Behavior normal.      Consultants:  GI  Procedures:    Data Reviewed: Results for orders placed or performed during the hospital encounter of 09/01/22 (from the past 24 hour(s))  Protime-INR     Status: Abnormal   Collection Time: 09/03/22  5:24 AM  Result Value Ref Range   Prothrombin Time 22.1 (H) 11.4 - 15.2 seconds   INR 2.0 (H) 0.8 - 1.2  Comprehensive metabolic panel     Status: Abnormal   Collection Time: 09/03/22  5:24 AM  Result Value Ref Range   Sodium 134 (L) 135 - 145 mmol/L   Potassium 3.6 3.5 - 5.1 mmol/L   Chloride 101 98 - 111 mmol/L   CO2 24 22 - 32 mmol/L   Glucose, Bld 134  (H) 70 - 99 mg/dL   BUN 11 6 - 20 mg/dL   Creatinine, Ser 0.50 0.44 - 1.00 mg/dL   Calcium 8.2 (L) 8.9 - 10.3 mg/dL   Total Protein 6.4 (L) 6.5 - 8.1 g/dL   Albumin 2.4 (L) 3.5 - 5.0 g/dL   AST 158 (H) 15 - 41 U/L   ALT 82 (H) 0 - 44 U/L   Alkaline Phosphatase 184 (H) 38 - 126 U/L   Total Bilirubin 19.1 (HH) 0.3 - 1.2 mg/dL   GFR, Estimated >60 >60 mL/min   Anion gap 9 5 - 15  CBC with Differential/Platelet     Status: Abnormal   Collection Time: 09/03/22  5:24 AM  Result Value Ref Range   WBC 16.5 (H) 4.0 - 10.5  K/uL   RBC 3.91 3.87 - 5.11 MIL/uL   Hemoglobin 14.1 12.0 - 15.0 g/dL   HCT 40.5 36.0 - 46.0 %   MCV 103.6 (H) 80.0 - 100.0 fL   MCH 36.1 (H) 26.0 - 34.0 pg   MCHC 34.8 30.0 - 36.0 g/dL   RDW 18.1 (H) 11.5 - 15.5 %   Platelets 220 150 - 400 K/uL   nRBC 0.0 0.0 - 0.2 %   Neutrophils Relative % 92 %   Neutro Abs 15.0 (H) 1.7 - 7.7 K/uL   Lymphocytes Relative 4 %   Lymphs Abs 0.7 0.7 - 4.0 K/uL   Monocytes Relative 3 %   Monocytes Absolute 0.5 0.1 - 1.0 K/uL   Eosinophils Relative 0 %   Eosinophils Absolute 0.0 0.0 - 0.5 K/uL   Basophils Relative 0 %   Basophils Absolute 0.1 0.0 - 0.1 K/uL   Immature Granulocytes 1 %   Abs Immature Granulocytes 0.18 (H) 0.00 - 0.07 K/uL  Magnesium     Status: Abnormal   Collection Time: 09/03/22  5:24 AM  Result Value Ref Range   Magnesium 2.7 (H) 1.7 - 2.4 mg/dL  Sedimentation rate     Status: Abnormal   Collection Time: 09/03/22  5:24 AM  Result Value Ref Range   Sed Rate 55 (H) 0 - 22 mm/hr  C-reactive protein     Status: Abnormal   Collection Time: 09/03/22  5:24 AM  Result Value Ref Range   CRP 20.1 (H) <1.0 mg/dL    I have Reviewed nursing notes, Vitals, and Lab results since pt's last encounter. Pertinent lab results : see above I have ordered test including BMP, CBC, Mg I have reviewed the last note from staff over past 24 hours I have discussed pt's care plan and test results with nursing staff, case manager  Time  spent: Greater than 50% of the 55 minute visit was spent in counseling/coordination of care for the patient as laid out in the A&P.    LOS: 2 days   Dwyane Dee, MD [facility] 09/03/2022, 2:14 PM

## 2022-09-03 NOTE — Progress Notes (Addendum)
Gastroenterology Inpatient Follow-up Note   PATIENT IDENTIFICATION  Jamie Coleman is a 51 y.o. female with a pmh significant for alcoholic liver disease (now felt to be cirrhosis with EV's on radiologic imaging) with recent diagnosis of alcoholic hepatitis, early remission of alcohol use disorder, anxiety, diverticulosis, hemorrhoids.  Patient admitted with acute on chronic liver disease secondary to severe alcoholic hepatitis. Hospital Day: 3  SUBJECTIVE  The patient's chart has been reviewed. The patient's labs have been reviewed. Today, the patient is in stable spirits. Her MELD 3.0 score has increased from 27 to 29. Patient received her steroids last night and will be receiving it later today. The patient denies fevers or chills. Patient is having episodes of bleeding from her hemorrhoids.  Her cream has not been helpful as of yet but she is only done a small amount of that currently.  She is interested and okay with suppositories though they may follow-up quickly. She did not feel significant improvement in lower extremity edema yesterday after diuretic administration. The patient has clinical evidence of progressed liver disease in regards to LE edema, deepening jaundice. Thankfully the patient does not have any evidence of encephalopathy or confusion or progressive abdominal pain.   OBJECTIVE  Scheduled Inpatient Medications:   hydrocortisone   Rectal Daily   lactulose  20 g Oral Daily   prednisoLONE  40 mg Oral QAC breakfast   Ensure Max Protein  11 oz Oral BID   Continuous Inpatient Infusions:   acetylcysteine 6.25 mg/kg/hr (09/03/22 0650)   PRN Inpatient Medications: albuterol, LORazepam, ondansetron **OR** ondansetron (ZOFRAN) IV, polyvinyl alcohol   Physical Examination  Temp:  [97.5 F (36.4 C)-99.3 F (37.4 C)] 98.5 F (36.9 C) (11/23 0554) Pulse Rate:  [76-106] 78 (11/23 0554) Resp:  [16-20] 20 (11/23 0554) BP: (107-121)/(63-80) 108/72 (11/23 0554) SpO2:   [96 %-99 %] 98 % (11/23 0554) Weight:  [87.5 kg] 87.5 kg (11/23 0500) Temp (24hrs), Avg:98.4 F (36.9 C), Min:97.5 F (36.4 C), Max:99.3 F (37.4 C)  Weight: 87.5 kg GEN: NAD, husband at bedside, appears stated age, nontoxic PSYCH: Cooperative, without pressured speech EYE: Icteric ENT: MMM CV: Nontachycardic RESP: No audible wheezing GI: Soft, protuberant abdomen, distended, nontender, without rebound or guarding MSK/EXT: 1+ bilateral pedal edema SKIN: Jaundiced, spider angiomata present NEURO:  Alert & Oriented x 3, no focal deficits, no evidence of asterixis   Review of Data   Laboratory Studies   Recent Labs  Lab 09/03/22 0524  NA 134*  K 3.6  CL 101  CO2 24  BUN 11  CREATININE 0.50  GLUCOSE 134*  CALCIUM 8.2*  MG 2.7*   Recent Labs  Lab 09/03/22 0524  AST 158*  ALT 82*  ALKPHOS 184*    Recent Labs  Lab 09/01/22 1845 09/02/22 0316 09/03/22 0524  WBC 21.5* 12.8* 16.5*  HGB 13.8 12.2 14.1  HCT 38.8 35.0* 40.5  PLT 279 210 220   Recent Labs  Lab 09/01/22 1845 09/02/22 0316 09/03/22 0524  INR 1.7* 1.6* 2.0*   MELD 3.0: 29 at 09/03/2022  5:24 AM MELD-Na: 26 at 09/03/2022  5:24 AM Calculated from: Serum Creatinine: 0.50 mg/dL (Using min of 1 mg/dL) at 09/03/2022  5:24 AM Serum Sodium: 134 mmol/L at 09/03/2022  5:24 AM Total Bilirubin: 19.1 mg/dL at 09/03/2022  5:24 AM Serum Albumin: 2.4 g/dL at 09/03/2022  5:24 AM INR(ratio): 2.0 at 09/03/2022  5:24 AM Age at listing (hypothetical): 51 years Sex: Female at 09/03/2022  5:24 AM  Imaging Studies  [place] ASCITES (ABDOMEN LIMITED)  Result Date: 09/02/2022 CLINICAL DATA:  Ascites EXAM: LIMITED ABDOMEN ULTRASOUND FOR ASCITES TECHNIQUE: Limited ultrasound survey for ascites was performed in all four abdominal quadrants. COMPARISON:  None Available. FINDINGS: Small pocket of fluid is seen in the left lower quadrant. IMPRESSION: Trace ascites in the left lower quadrant Electronically Signed   By: Marin Roberts M.D.   On: 09/02/2022 14:16   ECHOCARDIOGRAM COMPLETE  Result Date: 09/02/2022    ECHOCARDIOGRAM REPORT   Patient Name:   Jamie Coleman Date of Exam: 09/02/2022 Medical Rec #:  [id number]         Height:       61.5 in Accession #:    [id number]        Weight:       194.0 lb Date of Birth:  [birth year]         BSA:          1.876 m Patient Age:    41 years          BP:           119/66 mmHg Patient Gender: F                 HR:           100 bpm. Exam Location:  Inpatient Procedure: 2D Echo Indications:    fluid overload  History:        Patient has prior history of Echocardiogram examinations, most                 recent 09/27/2013. Risk Factors:Dyslipidemia.  Sonographer:    Harvie Junior Referring Phys: [id number] SARA-MAIZ A THOMAS  Sonographer Comments: Image acquisition challenging due to patient body habitus. IMPRESSIONS  1. Left ventricular ejection fraction, by estimation, is 70 to 75%. The left ventricle has hyperdynamic function. The left ventricle has no regional wall motion abnormalities. Left ventricular diastolic parameters were normal.  2. Right ventricular systolic function is hyperdynamic. The right ventricular size is normal. Tricuspid regurgitation signal is inadequate for assessing PA pressure.  3. The mitral valve was not well visualized. No evidence of mitral valve regurgitation. No evidence of mitral stenosis.  4. The aortic valve is tricuspid. Aortic valve regurgitation is not visualized. No aortic stenosis is present. Comparison(s): Unable to view prior study. FINDINGS  Left Ventricle: Left ventricular ejection fraction, by estimation, is 70 to 75%. The left ventricle has hyperdynamic function. The left ventricle has no regional wall motion abnormalities. The left ventricular internal cavity size was normal in size. There is no left ventricular hypertrophy. Left ventricular diastolic parameters were normal. Right Ventricle: The right ventricular size is normal. No increase in right  ventricular wall thickness. Right ventricular systolic function is hyperdynamic. Tricuspid regurgitation signal is inadequate for assessing PA pressure. Left Atrium: Left atrial size was normal in size. Right Atrium: Right atrial size was normal in size. Pericardium: There is no evidence of pericardial effusion. Presence of epicardial fat layer. Mitral Valve: The mitral valve was not well visualized. No evidence of mitral valve regurgitation. No evidence of mitral valve stenosis. Tricuspid Valve: The tricuspid valve is not well visualized. Tricuspid valve regurgitation is not demonstrated. No evidence of tricuspid stenosis. Aortic Valve: The aortic valve is tricuspid. Aortic valve regurgitation is not visualized. No aortic stenosis is present. Aortic valve mean gradient measures 6.0 mmHg. Aortic valve peak gradient measures 8.9 mmHg. Aortic valve area, by VTI measures 2.25 cm. Pulmonic Valve:  The pulmonic valve was normal in structure. Pulmonic valve regurgitation is not visualized. No evidence of pulmonic stenosis. Aorta: The aortic root and ascending aorta are structurally normal, with no evidence of dilitation. IAS/Shunts: No atrial level shunt detected by color flow Doppler.  LEFT VENTRICLE PLAX 2D LVIDd:         4.60 cm      Diastology LVIDs:         3.20 cm      LV e' medial:    9.36 cm/s LV PW:         0.80 cm      LV E/e' medial:  11.2 LV IVS:        0.80 cm      LV e' lateral:   10.10 cm/s LVOT diam:     1.70 cm      LV E/e' lateral: 10.4 LV SV:         66 LV SV Index:   35 LVOT Area:     2.27 cm  LV Volumes (MOD) LV vol d, MOD A2C: 71.5 ml LV vol d, MOD A4C: 111.0 ml LV vol s, MOD A2C: 32.0 ml LV vol s, MOD A4C: 48.2 ml LV SV MOD A2C:     39.5 ml LV SV MOD A4C:     111.0 ml LV SV MOD BP:      51.8 ml RIGHT VENTRICLE RV Basal diam:  3.10 cm RV Mid diam:    2.50 cm RV S prime:     18.50 cm/s TAPSE (M-mode): 2.4 cm LEFT ATRIUM             Index        RIGHT ATRIUM           Index LA diam:        3.30 cm  1.76 cm/m   RA Area:     10.20 cm LA Vol (A2C):   33.9 ml 18.07 ml/m  RA Volume:   19.40 ml  10.34 ml/m LA Vol (A4C):   66.7 ml 35.56 ml/m LA Biplane Vol: 51.1 ml 27.24 ml/m  AORTIC VALVE                     PULMONIC VALVE AV Area (Vmax):    2.25 cm      PV Vmax:       1.07 m/s AV Area (Vmean):   2.25 cm      PV Peak grad:  4.6 mmHg AV Area (VTI):     2.25 cm AV Vmax:           149.00 cm/s AV Vmean:          113.000 cm/s AV VTI:            0.292 m AV Peak Grad:      8.9 mmHg AV Mean Grad:      6.0 mmHg LVOT Vmax:         148.00 cm/s LVOT Vmean:        112.000 cm/s LVOT VTI:          0.289 m LVOT/AV VTI ratio: 0.99  AORTA Ao Root diam: 3.00 cm Ao Asc diam:  2.70 cm MITRAL VALVE MV Area (PHT): 3.31 cm     SHUNTS MV Decel Time: 229 msec     Systemic VTI:  0.29 m MR Peak grad: 21.3 mmHg     Systemic Diam: 1.70 cm MR Vmax:      231.00 cm/s  MV E velocity: 105.00 cm/s MV A velocity: 115.00 cm/s MV E/A ratio:  0.91 Rudean Haskell MD Electronically signed by Rudean Haskell MD Signature Date/Time: 09/02/2022/10:50:04 AM    Final    MR ABDOMEN MRCP W WO CONTAST  Result Date: 09/02/2022 CLINICAL DATA:  Jaundice history of alcohol associated hepatitis. EXAM: MRI ABDOMEN WITHOUT AND WITH CONTRAST (INCLUDING MRCP) TECHNIQUE: Multiplanar multisequence MR imaging of the abdomen was performed both before and after the administration of intravenous contrast. Heavily T2-weighted images of the biliary and pancreatic ducts were obtained, and three-dimensional MRCP images were rendered by post processing. CONTRAST:  16m GADAVIST GADOBUTROL 1 MMOL/ML IV SOLN COMPARISON:  Multiple priors including CT abdomen and pelvis September 01, 2022. FINDINGS: Lower chest: No acute abnormality. Hepatobiliary: No significant hepatic steatosis. Cirrhotic hepatic morphology. No suspicious hepatic lesion. Gallbladder is decompressed. Gallbladder wall thickening with pericholecystic fluid. Pancreas: No pancreatic ductal dilation.  Edematous appearance of the pancreas with peripancreatic fluid. Homogeneous postcontrast enhancement of the pancreas. Spleen: Mild splenomegaly measuring 14.1 cm in maximum axial dimension. Adrenals/Urinary Tract: Bilateral adrenal glands appear normal. No hydronephrosis. No solid enhancing renal mass. Stomach/Bowel: Mild wall thickening of the proximal duodenum as well as the ascending colon through the hepatic flexure. Vascular/Lymphatic: Normal caliber abdominal aorta. Smooth IVC contours. The portal, splenic and superior mesenteric veins are patent. Abdominal portosystemic collateral vessels including esophageal varices, gastric varices and recannulized paraumbilical vein. Prominent upper abdominal lymph nodes for instance a periportal lymph node measuring 9 mm. Other:  Trace abdominal free fluid. Musculoskeletal: No suspicious bone lesions identified. IMPRESSION: 1. Edematous appearance of the pancreas with peripancreatic fluid. Homogeneous postcontrast enhancement of the pancreas. Findings are favored to reflect acute interstitial pancreatitis. Recommend correlation with lipase. 2. Cirrhotic hepatic morphology with evidence of portal venous hypertension including portosystemic collateral vessels, trace ascites, mild splenomegaly and portal enterocolopathy. 3. No suspicious hepatic lesion identified. Electronically Signed   By: JDahlia BailiffM.D.   On: 09/02/2022 08:50   MR 3D Recon At Scanner  Result Date: 09/02/2022 CLINICAL DATA:  Jaundice history of alcohol associated hepatitis. EXAM: MRI ABDOMEN WITHOUT AND WITH CONTRAST (INCLUDING MRCP) TECHNIQUE: Multiplanar multisequence MR imaging of the abdomen was performed both before and after the administration of intravenous contrast. Heavily T2-weighted images of the biliary and pancreatic ducts were obtained, and three-dimensional MRCP images were rendered by post processing. CONTRAST:  937mGADAVIST GADOBUTROL 1 MMOL/ML IV SOLN COMPARISON:  Multiple  priors including CT abdomen and pelvis September 01, 2022. FINDINGS: Lower chest: No acute abnormality. Hepatobiliary: No significant hepatic steatosis. Cirrhotic hepatic morphology. No suspicious hepatic lesion. Gallbladder is decompressed. Gallbladder wall thickening with pericholecystic fluid. Pancreas: No pancreatic ductal dilation. Edematous appearance of the pancreas with peripancreatic fluid. Homogeneous postcontrast enhancement of the pancreas. Spleen: Mild splenomegaly measuring 14.1 cm in maximum axial dimension. Adrenals/Urinary Tract: Bilateral adrenal glands appear normal. No hydronephrosis. No solid enhancing renal mass. Stomach/Bowel: Mild wall thickening of the proximal duodenum as well as the ascending colon through the hepatic flexure. Vascular/Lymphatic: Normal caliber abdominal aorta. Smooth IVC contours. The portal, splenic and superior mesenteric veins are patent. Abdominal portosystemic collateral vessels including esophageal varices, gastric varices and recannulized paraumbilical vein. Prominent upper abdominal lymph nodes for instance a periportal lymph node measuring 9 mm. Other:  Trace abdominal free fluid. Musculoskeletal: No suspicious bone lesions identified. IMPRESSION: 1. Edematous appearance of the pancreas with peripancreatic fluid. Homogeneous postcontrast enhancement of the pancreas. Findings are favored to reflect acute interstitial pancreatitis. Recommend correlation with lipase. 2.  Cirrhotic hepatic morphology with evidence of portal venous hypertension including portosystemic collateral vessels, trace ascites, mild splenomegaly and portal enterocolopathy. 3. No suspicious hepatic lesion identified. Electronically Signed   By: Dahlia Bailiff M.D.   On: 09/02/2022 08:50   DG Chest Portable 1 View  Result Date: 09/01/2022 CLINICAL DATA:  Pulmonary edema and leg swelling. EXAM: PORTABLE CHEST 1 VIEW COMPARISON:  05/05/2022. FINDINGS: The heart size and mediastinal contours are  within normal limits. No consolidation, effusion, or pneumothorax. No acute osseous abnormality. IMPRESSION: No active disease. Electronically Signed   By: Brett Fairy M.D.   On: 09/01/2022 21:09   CT ABDOMEN PELVIS W CONTRAST  Result Date: 09/01/2022 CLINICAL DATA:  Epigastric pain. Pt states she is in liver failure. States she had labs drawn this morning and was told to come to the hospital for admission. Pt reports nose bleeds, bleeding hemorrhoids, yellow skin. EXAM: CT ABDOMEN AND PELVIS WITH CONTRAST TECHNIQUE: Multidetector CT imaging of the abdomen and pelvis was performed using the standard protocol following bolus administration of intravenous contrast. RADIATION DOSE REDUCTION: This exam was performed according to the departmental dose-optimization program which includes automated exposure control, adjustment of the mA and/or kV according to patient size and/or use of iterative reconstruction technique. CONTRAST:  171m OMNIPAQUE IOHEXOL 300 MG/ML  SOLN COMPARISON:  Ultrasound abdomen 09/17/2021 FINDINGS: Lower chest: No acute abnormality. Hepatobiliary: Nodular hepatic contour. Enlarged left hepatic lobe. The liver is enlarged measuring up to 20 cm. No focal liver abnormality. Contracted gallbladder. No gallstones, gallbladder wall thickening, or pericholecystic fluid. No biliary dilatation. Pancreas: No focal lesion. Normal pancreatic contour. No surrounding inflammatory changes. No main pancreatic ductal dilatation. Spleen: Normal in size without focal abnormality. Adrenals/Urinary Tract: No adrenal nodule bilaterally. Bilateral kidneys enhance symmetrically. No hydronephrosis. No hydroureter. The urinary bladder is unremarkable. On delayed imaging, there is no urothelial wall thickening and there are no filling defects in the opacified portions of the bilateral collecting systems or ureters. Stomach/Bowel: Stomach is within normal limits. No evidence of bowel wall thickening or dilatation.  Appendix appears normal. Vascular/Lymphatic: Recanalized paraumbilical vein. Paraesophageal, perigastric, and abdominopelvic venous collaterals are noted. The main portal, splenic, superior mesenteric veins are patent. No abdominal aorta or iliac aneurysm. Mild atherosclerotic plaque of the aorta and its branches. No abdominal, pelvic, or inguinal lymphadenopathy. Reproductive: Uterus and bilateral adnexa are unremarkable. Other: Trace to small volume simple free fluid within the upper abdomen and within the pelvis. No intraperitoneal free gas. No organized fluid collection. Musculoskeletal: Subcutaneus soft tissue edema. No suspicious lytic or blastic osseous lesions. No acute displaced fracture. Multilevel degenerative changes of the spine. IMPRESSION: 1. Cirrhosis with portal hypertension. No focal liver lesions identified. Please note that liver protocol enhanced MR and CT are the most sensitive tests for the screening detection of hepatocellular carcinoma in the high risk setting of cirrhosis. 2. Trace to small volume simple free fluid ascites. 3.  Aortic Atherosclerosis (ICD10-I70.0). Electronically Signed   By: MIven FinnM.D.   On: 09/01/2022 20:10    GI Procedures and Studies  No new studies to review   ASSESSMENT  Ms. PRoarkis a 51y.o. female with a pmh significant for alcoholic liver disease (now felt to be cirrhosis) with recent diagnosis of alcoholic hepatitis, early remission of alcohol use disorder, anxiety, diverticulosis, hemorrhoids.  Patient admitted with acute on chronic liver disease secondary to severe alcoholic hepatitis.  The patient is hemodynamically stable today.  Although her MELD score has slightly increased 1  or 2 points is not as concerning to me at this time.  She certainly shows no evidence of encephalopathy which is most important at this time.  We are going to advance her diet and let her get protein supplementation as per dietary colleagues we have seen her.  I  am going to draw some additional labs that will be helpful in regards to potential liver transplantation candidacy evaluation, those will be drawn tomorrow.  Based on where things stand today I am hopeful that the patient will be able to be discharged home with a relatively quick transplant candidacy evaluation rather than having to be transferred as an inpatient for evaluation of candidacy.  I have no reason why the patient cannot be able to take some walks today and come off the N-acetylcysteine if needed.  I am going to write her for some diuretic therapy today to see if we can help get some of her lower extremity edema off.  Her clinical status is guarded but certainly she is in a better place than she was a few days ago coming in.  Fingers crossed she stabilizes.  Fingers crossed she can be evaluated for consideration of candidacy for liver transplantation if necessary.  Holding on inpatient evaluation for now.  I will update the [facility] transplant hepatology team if something else dramatically changes (I greatly appreciate their willingness to evaluate this patient for candidacy purposes).  We will bring in anoscope tomorrow to evaluate her hemorrhoids if she continues to have issues.  We will give her some suppositories today.  All patient questions were answered to the best of my ability, and the patient agrees to the aforementioned plan of action with follow-up as indicated.  Patient's clinical status is guarded/critical.   PLAN/RECOMMENDATIONS  Initiate Lasix 20 mg (agree with medicine service initiating this) Initiate spironolactone 50 mg (agree with medicine service initiating this) MVI/thiamine/folate daily Continue prednisolone 40 mg daily CMV IgG, varicella IgG, RPR, QuantiFERON gold to be obtained tomorrow Daily standing weight should be obtained 1500 mg sodium diet Lactulose once to twice daily as needed (titrate to 3 bowel movements daily)-she has never had PSC but this is just as a means  of trying to optimize and minimize risk of this developing Patient will need HAV vaccination in future She received 10 mg IV vitamin K today (this is day 3) Promote intake of 1.5 g/kg/day of Protein (Ensures/Boosts at each meal or as per dietary) Patient will benefit from liver transplant candidacy evaluation hopefully this will be able to be done as an expedited outpatient with [facility] transplant hepatology versus if patient has clinical deterioration/decompensation the need for inpatient transfer for evaluation (we have already received confirmation that her insurance is excepted for transplant evaluation)   Please page/call with questions or concerns.   Justice Britain, MD [facility] Office # [phone]    LOS: 2 days  Irving Copas  09/03/2022, 8:07 AM

## 2022-09-04 DIAGNOSIS — F413 Other mixed anxiety disorders: Secondary | ICD-10-CM

## 2022-09-04 DIAGNOSIS — K7200 Acute and subacute hepatic failure without coma: Secondary | ICD-10-CM | POA: Diagnosis not present

## 2022-09-04 DIAGNOSIS — K7010 Alcoholic hepatitis without ascites: Secondary | ICD-10-CM | POA: Diagnosis not present

## 2022-09-04 DIAGNOSIS — K7030 Alcoholic cirrhosis of liver without ascites: Secondary | ICD-10-CM | POA: Diagnosis not present

## 2022-09-04 LAB — COMPREHENSIVE METABOLIC PANEL WITH GFR
ALT: 73 U/L — ABNORMAL HIGH (ref 0–44)
AST: 86 U/L — ABNORMAL HIGH (ref 15–41)
Albumin: 2.2 g/dL — ABNORMAL LOW (ref 3.5–5.0)
Alkaline Phosphatase: 170 U/L — ABNORMAL HIGH (ref 38–126)
Anion gap: 9 (ref 5–15)
BUN: 13 mg/dL (ref 6–20)
CO2: 25 mmol/L (ref 22–32)
Calcium: 8.5 mg/dL — ABNORMAL LOW (ref 8.9–10.3)
Chloride: 99 mmol/L (ref 98–111)
Creatinine, Ser: 0.51 mg/dL (ref 0.44–1.00)
GFR, Estimated: >60 mL/min
Glucose, Bld: 119 mg/dL — ABNORMAL HIGH (ref 70–99)
Potassium: 3.6 mmol/L (ref 3.5–5.1)
Sodium: 133 mmol/L — ABNORMAL LOW (ref 135–145)
Total Bilirubin: 18.9 mg/dL (ref 0.3–1.2)
Total Protein: 6.2 g/dL — ABNORMAL LOW (ref 6.5–8.1)

## 2022-09-04 LAB — CBC WITH DIFFERENTIAL/PLATELET
Abs Immature Granulocytes: 0.1 K/uL — ABNORMAL HIGH (ref 0.00–0.07)
Basophils Absolute: 0 K/uL (ref 0.0–0.1)
Basophils Relative: 0 %
Eosinophils Absolute: 0.1 K/uL (ref 0.0–0.5)
Eosinophils Relative: 1 %
HCT: 37.6 % (ref 36.0–46.0)
Hemoglobin: 13.1 g/dL (ref 12.0–15.0)
Immature Granulocytes: 1 %
Lymphocytes Relative: 8 %
Lymphs Abs: 1.1 K/uL (ref 0.7–4.0)
MCH: 36.3 pg — ABNORMAL HIGH (ref 26.0–34.0)
MCHC: 34.8 g/dL (ref 30.0–36.0)
MCV: 104.2 fL — ABNORMAL HIGH (ref 80.0–100.0)
Monocytes Absolute: 0.6 K/uL (ref 0.1–1.0)
Monocytes Relative: 5 %
Neutro Abs: 12.4 K/uL — ABNORMAL HIGH (ref 1.7–7.7)
Neutrophils Relative %: 85 %
Platelets: 192 K/uL (ref 150–400)
RBC: 3.61 MIL/uL — ABNORMAL LOW (ref 3.87–5.11)
RDW: 17.6 % — ABNORMAL HIGH (ref 11.5–15.5)
WBC: 14.4 K/uL — ABNORMAL HIGH (ref 4.0–10.5)
nRBC: 0 % (ref 0.0–0.2)

## 2022-09-04 LAB — MAGNESIUM: Magnesium: 2.8 mg/dL — ABNORMAL HIGH (ref 1.7–2.4)

## 2022-09-04 LAB — PROTIME-INR
INR: 1.8 — ABNORMAL HIGH (ref 0.8–1.2)
Prothrombin Time: 20.4 s — ABNORMAL HIGH (ref 11.4–15.2)

## 2022-09-04 LAB — SYPHILIS: RPR W/REFLEX TO RPR TITER AND TREPONEMAL ANTIBODIES, TRADITIONAL SCREENING AND DIAGNOSIS ALGORITHM: RPR Ser Ql: NONREACTIVE

## 2022-09-04 LAB — T3: T3, Total: 58 ng/dL — ABNORMAL LOW (ref 71–180)

## 2022-09-04 LAB — ALPHA-1-ANTITRYPSIN: A-1 Antitrypsin, Ser: 269 mg/dL — ABNORMAL HIGH (ref 101–187)

## 2022-09-04 LAB — CERULOPLASMIN: Ceruloplasmin: 36.8 mg/dL (ref 19.0–39.0)

## 2022-09-04 MED ORDER — ALBUMIN HUMAN 25 % IV SOLN
25.0000 g | Freq: Four times a day (QID) | INTRAVENOUS | Status: AC
  Administered 2022-09-04 – 2022-09-05 (×4): 25 g via INTRAVENOUS
  Filled 2022-09-04 (×4): qty 100

## 2022-09-04 MED ORDER — ALBUMIN HUMAN 25 % IV SOLN
25.0000 g | Freq: Four times a day (QID) | INTRAVENOUS | Status: DC
  Filled 2022-09-04 (×2): qty 100

## 2022-09-04 MED ORDER — VITAMIN K1 10 MG/ML IJ SOLN
10.0000 mg | Freq: Once | INTRAVENOUS | Status: DC
  Administered 2022-09-04: 10 mg via INTRAVENOUS
  Filled 2022-09-04: qty 1

## 2022-09-04 NOTE — Plan of Care (Signed)

## 2022-09-04 NOTE — TOC Initial Note (Signed)
Transition of Care [facility]) - Initial/Assessment Note    Patient Details  Name: Jamie Coleman MRN: [id number] Date of Birth: 1971/05/11  Transition of Care [facility]) CM/SW Contact:    Henrietta Dine, RN Phone Number: 09/04/2022, 12:23 PM  Clinical Narrative:                  Transition of Care [facility] Screening Note   Patient Details  Name: Jamie Coleman Date of Birth: 1971/06/04   Transition of Care [facility]) CM/SW Contact:    Henrietta Dine, RN Phone Number: 09/04/2022, 12:23 PM    Transition of Care Department [facility]) has reviewed patient and no TOC needs have been identified at this time. We will continue to monitor patient advancement through interdisciplinary progression rounds. If new patient transition needs arise, please place a TOC consult.          Patient Goals and CMS Choice        Expected Discharge Plan and Services                                                Prior Living Arrangements/Services                       Activities of Daily Living Home Assistive Devices/Equipment: Eyeglasses ADL Screening (condition at time of admission) Patient's cognitive ability adequate to safely complete daily activities?: Yes Is the patient deaf or have difficulty hearing?: No Does the patient have difficulty seeing, even when wearing glasses/contacts?: No Does the patient have difficulty concentrating, remembering, or making decisions?: No Patient able to express need for assistance with ADLs?: Yes Does the patient have difficulty dressing or bathing?: No Independently performs ADLs?: Yes (appropriate for developmental age) Does the patient have difficulty walking or climbing stairs?: No Weakness of Legs: None Weakness of Arms/Hands: None  Permission Sought/Granted                  Emotional Assessment              Admission diagnosis:  Liver failure ([place]) [K72.90] Jaundice [R17] Patient Active Problem  List   Diagnosis Date Noted   Cirrhosis, alcoholic ([place]) 09/47/0962   Macrocytosis without anemia 09/02/2022   Elevated lipase 09/02/2022   Acute liver failure without hepatic coma 09/01/2022   Shortness of breath 05/05/2022   Bronchitis due to COVID-19 virus 05/05/2022   Alcohol dependence ([place]) 83/66/2947   Alcoholic hepatitis 65/46/5035   Alcohol withdrawal syndrome without complication ([place])    Elevated liver transaminase level 09/03/2019   Subclinical hypothyroidism 09/03/2019   Vitamin D deficiency 03/02/2018   Fatigue 03/02/2018   Pupil asymmetry 08/04/2016   Hemorrhoids 12/28/2014   Prediabetes 08/28/2014   Tremor 06/30/2013   Sebaceous cyst 02/15/2013   OTHER&UNSPECIFIED DISEASES THE ORAL SOFT TISSUES 07/25/2008   DERMATOFIBROMA, ARM 11/11/2007   NEOPLASM, SKIN, UNCERTAIN BEHAVIOR 46/56/8127   Hyperlipidemia 02/10/2007   Anxiety disorder 02/10/2007   ALLERGIC RHINITIS 02/10/2007   GERD 02/10/2007   IBS 02/10/2007   PCP:  Abner Greenspan, MD Pharmacy:   [facility] [place] [address] Phone: [phone] Fax: [phone]     Social Determinants of Health (SDOH) Interventions    Readmission Risk Interventions     No data to display

## 2022-09-04 NOTE — Progress Notes (Addendum)
Patient ID: Jamie Coleman, female   DOB: 10/01/1971, 51 y.o.   MRN: [id number]    Progress Note   Subjective   Day # 3  CC; EtOH induced cirrhosis, recent diagnosis of alcoholic hepatitis  Prednisolone 40 mg day # 15 N-acetylcysteine  M ELD 3.0=29 MDF=60  CMV IgG/varicella IgG/RPR/QuantiFERON gold pending 09/04/2022-WBC 14.4/hemoglobin 13.1/hematocrit 37.6/platelets 192 Sodium 133/potassium 3.6/creatinine 0.51 T. bili 18.9/alk phos 170/AST 86/ALT 73 INR yesterday 2.0  Weight 87.5 down to 85.7 kg today   Mentating well, husband at bedside no overt evidence of encephalopathy She has been been retaining a lot of fluid, abdomen had felt so full that she was unable to eat but feels a bit better since receiving Lasix yesterday and plans to try solid food this morning She relates that she is still having a little bit of hemorrhoidal bleeding but much improvement over the past few days.    Objective   Vital signs in last 24 hours: Temp:  [97.6 F (36.4 C)-98.6 F (37 C)] 97.6 F (36.4 C) (11/24 0640) Pulse Rate:  [81-95] 88 (11/24 0640) Resp:  [17-21] 21 (11/24 0640) BP: (112-128)/(73-79) 112/73 (11/24 0640) SpO2:  [97 %-98 %] 98 % (11/24 0640) FiO2 (%):  [21 %] 21 % (11/24 0640) Weight:  [85.8 kg] 85.8 kg (11/24 0500) Last BM Date : 09/03/22 General:   Jaundiced white female in NAD Heart:  Regular rate and rhythm; no murmurs Lungs: Respirations even and unlabored, lungs CTA bilaterally Abdomen:  Soft, full feeling, no appreciable fluid wave, no pitting edema in the flanks normal bowel sounds. Extremities: 1-2+ edema bilateral lower extremities Neurologic:  Alert and oriented,  grossly normal neurologically.  No asterixis Psych:  Cooperative. Normal mood and affect.  Intake/Output from previous day: 11/23 0701 - 11/24 0700 In: 832.8 [P.O.:480; I.V.:352.8] Out: -  Intake/Output this shift: No intake/output data recorded.  Lab Results: Recent Labs    09/02/22 0316  09/03/22 0524 09/04/22 0730  WBC 12.8* 16.5* 14.4*  HGB 12.2 14.1 13.1  HCT 35.0* 40.5 37.6  PLT 210 220 192   BMET Recent Labs    09/02/22 0316 09/03/22 0524 09/04/22 0730  NA 135 134* 133*  K 3.7 3.6 3.6  CL 101 101 99  CO2 _0 GLUCOSE 101* 134* 119*  BUN _1 CREATININE 0.43* 0.50 0.51  CALCIUM 8.3* 8.2* 8.5*   LFT Recent Labs    09/01/22 0944 09/01/22 1845 09/04/22 0730  PROT 5.9*  5.9*   < > 6.2*  ALBUMIN 2.9*  2.9*   < > 2.2*  AST 110*  110*   < > 86*  ALT 62*  62*   < > 73*  ALKPHOS 206*  206*   < > 170*  BILITOT 16.8*  16.8*   < > 18.9*  BILIDIR 13.8*  --   --    < > = values in this interval not displayed.   PT/INR Recent Labs    09/02/22 0316 09/03/22 0524  LABPROT 18.9* 22.1*  INR 1.6* 2.0*    Studies/Results: [place] ASCITES (ABDOMEN LIMITED)  Result Date: 09/02/2022 CLINICAL DATA:  Ascites EXAM: LIMITED ABDOMEN ULTRASOUND FOR ASCITES TECHNIQUE: Limited ultrasound survey for ascites was performed in all four abdominal quadrants. COMPARISON:  None Available. FINDINGS: Small pocket of fluid is seen in the left lower quadrant. IMPRESSION: Trace ascites in the left lower quadrant Electronically Signed   By: Marin Roberts M.D.   On: 09/02/2022 14:16  ECHOCARDIOGRAM COMPLETE  Result Date: 09/02/2022    ECHOCARDIOGRAM REPORT   Patient Name:   Jamie Coleman Date of Exam: 09/02/2022 Medical Rec #:  [id number]         Height:       61.5 in Accession #:    [id number]        Weight:       194.0 lb Date of Birth:  13-Oct-1970         BSA:          1.876 m Patient Age:    37 years          BP:           119/66 mmHg Patient Gender: F                 HR:           100 bpm. Exam Location:  Inpatient Procedure: 2D Echo Indications:    fluid overload  History:        Patient has prior history of Echocardiogram examinations, most                 recent 09/27/2013. Risk Factors:Dyslipidemia.  Sonographer:    Harvie Junior Referring Phys: [id number] SARA-MAIZ  A THOMAS  Sonographer Comments: Image acquisition challenging due to patient body habitus. IMPRESSIONS  1. Left ventricular ejection fraction, by estimation, is 70 to 75%. The left ventricle has hyperdynamic function. The left ventricle has no regional wall motion abnormalities. Left ventricular diastolic parameters were normal.  2. Right ventricular systolic function is hyperdynamic. The right ventricular size is normal. Tricuspid regurgitation signal is inadequate for assessing PA pressure.  3. The mitral valve was not well visualized. No evidence of mitral valve regurgitation. No evidence of mitral stenosis.  4. The aortic valve is tricuspid. Aortic valve regurgitation is not visualized. No aortic stenosis is present. Comparison(s): Unable to view prior study. FINDINGS  Left Ventricle: Left ventricular ejection fraction, by estimation, is 70 to 75%. The left ventricle has hyperdynamic function. The left ventricle has no regional wall motion abnormalities. The left ventricular internal cavity size was normal in size. There is no left ventricular hypertrophy. Left ventricular diastolic parameters were normal. Right Ventricle: The right ventricular size is normal. No increase in right ventricular wall thickness. Right ventricular systolic function is hyperdynamic. Tricuspid regurgitation signal is inadequate for assessing PA pressure. Left Atrium: Left atrial size was normal in size. Right Atrium: Right atrial size was normal in size. Pericardium: There is no evidence of pericardial effusion. Presence of epicardial fat layer. Mitral Valve: The mitral valve was not well visualized. No evidence of mitral valve regurgitation. No evidence of mitral valve stenosis. Tricuspid Valve: The tricuspid valve is not well visualized. Tricuspid valve regurgitation is not demonstrated. No evidence of tricuspid stenosis. Aortic Valve: The aortic valve is tricuspid. Aortic valve regurgitation is not visualized. No aortic stenosis is  present. Aortic valve mean gradient measures 6.0 mmHg. Aortic valve peak gradient measures 8.9 mmHg. Aortic valve area, by VTI measures 2.25 cm. Pulmonic Valve: The pulmonic valve was normal in structure. Pulmonic valve regurgitation is not visualized. No evidence of pulmonic stenosis. Aorta: The aortic root and ascending aorta are structurally normal, with no evidence of dilitation. IAS/Shunts: No atrial level shunt detected by color flow Doppler.  LEFT VENTRICLE PLAX 2D LVIDd:         4.60 cm      Diastology LVIDs:  3.20 cm      LV e' medial:    9.36 cm/s LV PW:         0.80 cm      LV E/e' medial:  11.2 LV IVS:        0.80 cm      LV e' lateral:   10.10 cm/s LVOT diam:     1.70 cm      LV E/e' lateral: 10.4 LV SV:         66 LV SV Index:   35 LVOT Area:     2.27 cm  LV Volumes (MOD) LV vol d, MOD A2C: 71.5 ml LV vol d, MOD A4C: 111.0 ml LV vol s, MOD A2C: 32.0 ml LV vol s, MOD A4C: 48.2 ml LV SV MOD A2C:     39.5 ml LV SV MOD A4C:     111.0 ml LV SV MOD BP:      51.8 ml RIGHT VENTRICLE RV Basal diam:  3.10 cm RV Mid diam:    2.50 cm RV S prime:     18.50 cm/s TAPSE (M-mode): 2.4 cm LEFT ATRIUM             Index        RIGHT ATRIUM           Index LA diam:        3.30 cm 1.76 cm/m   RA Area:     10.20 cm LA Vol (A2C):   33.9 ml 18.07 ml/m  RA Volume:   19.40 ml  10.34 ml/m LA Vol (A4C):   66.7 ml 35.56 ml/m LA Biplane Vol: 51.1 ml 27.24 ml/m  AORTIC VALVE                     PULMONIC VALVE AV Area (Vmax):    2.25 cm      PV Vmax:       1.07 m/s AV Area (Vmean):   2.25 cm      PV Peak grad:  4.6 mmHg AV Area (VTI):     2.25 cm AV Vmax:           149.00 cm/s AV Vmean:          113.000 cm/s AV VTI:            0.292 m AV Peak Grad:      8.9 mmHg AV Mean Grad:      6.0 mmHg LVOT Vmax:         148.00 cm/s LVOT Vmean:        112.000 cm/s LVOT VTI:          0.289 m LVOT/AV VTI ratio: 0.99  AORTA Ao Root diam: 3.00 cm Ao Asc diam:  2.70 cm MITRAL VALVE MV Area (PHT): 3.31 cm     SHUNTS MV Decel Time: 229  msec     Systemic VTI:  0.29 m MR Peak grad: 21.3 mmHg     Systemic Diam: 1.70 cm MR Vmax:      231.00 cm/s MV E velocity: 105.00 cm/s MV A velocity: 115.00 cm/s MV E/A ratio:  0.91 Rudean Haskell MD Electronically signed by Rudean Haskell MD Signature Date/Time: 09/02/2022/10:50:04 AM    Final        Assessment / Plan:    #4 51 year old white female with acute alcoholic hepatitis severe-steroids initiated as an outpatient on 08/20/2022, continues on prednisolone here-most recent imaging also suggestive of cirrhosis N-acetylcysteine started yesterday  She really has not had any significant improvement in parameters over the past few days  Last EtOH 08/15/2022  Continue prednisolone day #15 Continue N-acetylcysteine Continue lactulose  MDF =60, no significant improvement in score L[place] score-calculated from day 0 today 15= 0.80 (rec to stop steroids)   #2 small-volume hematochezia secondary to internal hemorrhoids-improved, continue suppositories #3 coagulopathy secondary to above - will continue vitamin K daily  #4 volume overload-no sniffing and ascites on ultrasound-continue 2 g sodium diet Continue Lasix 20 mg daily and spironolactone 50 mg daily  #5 esophageal, gastric varices noted on imaging     Principal Problem:   Acute liver failure without hepatic coma Active Problems:   Anxiety disorder   Subclinical hypothyroidism   Alcoholic hepatitis   Cirrhosis, alcoholic (HCC)   Macrocytosis without anemia   Elevated lipase     LOS: 3 days   Tariq Pernell  PA-C11/24/2023, 8:48 AM

## 2022-09-04 NOTE — Progress Notes (Signed)
Progress Note    Jamie Coleman   OHY:[id number]  DOB: 04-29-1971  DOA: 09/01/2022     3 PCP: Abner Greenspan, MD  Initial CC: worsening liver function   Hospital Course: Jamie Coleman is a 51 yo female with PMH etoh hepatitis, cirrhosis, anxiety, HLD, GERD, prediabetes who presented upon referral from the GI office on 09/01/22. She had been under treatment for etoh hepatitis with prednisolone with some response, however TB began trending up as well as concern for decompensation, therefore she was referred to the ER for further workup and evaluation.   Interval History:  No events overnight. Xanax helped when used yesterday. Improved ability to breathe since yesterday; still ongoing LE edema. Mentation remains normal. Husband present bedside this morning as well.   Assessment and Plan: * Acute liver failure without hepatic coma - on admission TB 16.8, PT/INR 26.7/2.6. AST/ALT 110/62, ALP 206 - s/p 10 mg Vit K on 11/21 followed by repeat 10 mg on 11/22 - trend PT/INR - prior hx etoh use; has been abstinent since ~08/17/22 - normal mentation; NH3 also normal on admission 29 - NAC initiated per GI given progressive hepatic dysfunction despite prior treatment - possible evaluation for transplant pending GI discussions with transplant centers; tentatively planning for close outpatient followup with [facility] unless were to further decompensate requiring inpatient transfer - no alpha 1 AT deficiency noted; ceruloplasmin ULN - trend coags and LFTs -Lactulose started per GI.  If develops encephalopathy, will trend ammonia (has remained lucid)   Alcoholic hepatitis - previously treated last hospitalization with steroids with good response per [facility] model; however repeat MDF up to 84 points on admission  - continue thiamine, folic, MVI - prednisolone resumed per GI  Cirrhosis, alcoholic (HCC) - small volume ascites noted on imaging; likely not enough for paracentesis  - continue trending  LFTs - continue lactulose  - see ALF -Portal venous hypertension appreciated on MRCP.  Portosystemic collateral vessels noted.  Patient has undergone EGD and colonoscopy May 2023 - given generalized edema, will start spironolactone and lasix (IV lasix given risk of gut edema) - initiate albumin 11/24 x 1 day to help with 3rd spacing and limb edema  Elevated lipase - Lipase 76 on admission - patient denies any N/V nor abdominal pain; MRCP notes edematous pancreas with peripancreatic fluid.  Clinically does not have pancreatitis.  Edematous findings possibly in the setting of tissue edema from hypoalbuminemia -Continue monitoring for any further developing signs of pancreatitis  Macrocytosis without anemia - MCV 104 - B12 1,940 on 11/21  Subclinical hypothyroidism - TSH 6.162, FT4 1.1 (normal range) - TSH likely elevated in setting of acute illness - repeat TSH in 4-6 week or once more medically stable  Anxiety disorder - patient request to change to xanax as ativan caused her to feel anxious  - caution for oversedation    Old records reviewed in assessment of this patient  Antimicrobials:   DVT prophylaxis:  SCDs Start: 09/02/22 0134   Code Status:   Code Status: Full Code  Mobility Assessment (last 72 hours)     Mobility Assessment     Row Name 09/03/22 2030 09/03/22 1030 09/02/22 2030 09/02/22 1230     Does patient have an order for bedrest or is patient medically unstable No - Continue assessment No - Continue assessment No - Continue assessment No - Continue assessment    What is the highest level of mobility based on the progressive mobility assessment? Level 6 (Walks independently  in room and hall) - Balance while walking in room without assist - Complete Level 6 (Walks independently in room and hall) - Balance while walking in room without assist - Complete Level 6 (Walks independently in room and hall) - Balance while walking in room without assist - Complete Level  5 (Walks with assist in room/hall) - Balance while stepping forward/back and can walk in room with assist - Complete             Barriers to discharge:  Disposition Plan:  Home 2-3 days Status is: Inpt  Objective: Blood pressure 112/73, pulse 88, temperature 97.6 F (36.4 C), temperature source Oral, resp. rate (!) 21, height 5' 1.5" (1.562 m), weight 85.8 kg, SpO2 98 %.  Examination:  Physical Exam Constitutional:      General: She is not in acute distress.    Appearance: Normal appearance.  HENT:     Head: Normocephalic and atraumatic.     Mouth/Throat:     Mouth: Mucous membranes are moist.  Eyes:     General: Scleral icterus present.     Extraocular Movements: Extraocular movements intact.  Cardiovascular:     Rate and Rhythm: Normal rate and regular rhythm.  Pulmonary:     Effort: Pulmonary effort is normal.     Breath sounds: Normal breath sounds.  Abdominal:     General: Bowel sounds are normal. There is distension.     Palpations: Abdomen is soft.     Tenderness: There is no abdominal tenderness. There is no guarding.     Comments: Improved abdominal distension  Musculoskeletal:        General: Swelling present. Normal range of motion.     Cervical back: Normal range of motion and neck supple.     Comments: Persistent 2-3+ B/L LE pitting edema  Skin:    General: Skin is warm.     Coloration: Skin is jaundiced.  Neurological:     General: No focal deficit present.     Mental Status: She is alert and oriented to person, place, and time.     Comments: No tremor or asterixis appreciated  Psychiatric:        Mood and Affect: Mood normal.        Behavior: Behavior normal.      Consultants:  GI  Procedures:    Data Reviewed: Results for orders placed or performed during the hospital encounter of 09/01/22 (from the past 24 hour(s))  CBC with Differential/Platelet     Status: Abnormal   Collection Time: 09/04/22  7:30 AM  Result Value Ref Range   WBC  14.4 (H) 4.0 - 10.5 K/uL   RBC 3.61 (L) 3.87 - 5.11 MIL/uL   Hemoglobin 13.1 12.0 - 15.0 g/dL   HCT 37.6 36.0 - 46.0 %   MCV 104.2 (H) 80.0 - 100.0 fL   MCH 36.3 (H) 26.0 - 34.0 pg   MCHC 34.8 30.0 - 36.0 g/dL   RDW 17.6 (H) 11.5 - 15.5 %   Platelets 192 150 - 400 K/uL   nRBC 0.0 0.0 - 0.2 %   Neutrophils Relative % 85 %   Neutro Abs 12.4 (H) 1.7 - 7.7 K/uL   Lymphocytes Relative 8 %   Lymphs Abs 1.1 0.7 - 4.0 K/uL   Monocytes Relative 5 %   Monocytes Absolute 0.6 0.1 - 1.0 K/uL   Eosinophils Relative 1 %   Eosinophils Absolute 0.1 0.0 - 0.5 K/uL   Basophils Relative 0 %  Basophils Absolute 0.0 0.0 - 0.1 K/uL   Immature Granulocytes 1 %   Abs Immature Granulocytes 0.10 (H) 0.00 - 0.07 K/uL  Comprehensive metabolic panel     Status: Abnormal   Collection Time: 09/04/22  7:30 AM  Result Value Ref Range   Sodium 133 (L) 135 - 145 mmol/L   Potassium 3.6 3.5 - 5.1 mmol/L   Chloride 99 98 - 111 mmol/L   CO2 25 22 - 32 mmol/L   Glucose, Bld 119 (H) 70 - 99 mg/dL   BUN 13 6 - 20 mg/dL   Creatinine, Ser 0.51 0.44 - 1.00 mg/dL   Calcium 8.5 (L) 8.9 - 10.3 mg/dL   Total Protein 6.2 (L) 6.5 - 8.1 g/dL   Albumin 2.2 (L) 3.5 - 5.0 g/dL   AST 86 (H) 15 - 41 U/L   ALT 73 (H) 0 - 44 U/L   Alkaline Phosphatase 170 (H) 38 - 126 U/L   Total Bilirubin 18.9 (HH) 0.3 - 1.2 mg/dL   GFR, Estimated >60 >60 mL/min   Anion gap 9 5 - 15  Magnesium     Status: Abnormal   Collection Time: 09/04/22  7:30 AM  Result Value Ref Range   Magnesium 2.8 (H) 1.7 - 2.4 mg/dL  RPR     Status: None   Collection Time: 09/04/22  7:30 AM  Result Value Ref Range   RPR Ser Ql NON REACTIVE NON REACTIVE  Protime-INR     Status: Abnormal   Collection Time: 09/04/22 10:47 AM  Result Value Ref Range   Prothrombin Time 20.4 (H) 11.4 - 15.2 seconds   INR 1.8 (H) 0.8 - 1.2    I have Reviewed nursing notes, Vitals, and Lab results since pt's last encounter. Pertinent lab results : see above I have ordered test  including BMP, CBC, Mg I have reviewed the last note from staff over past 24 hours I have discussed pt's care plan and test results with nursing staff, case manager  Time spent: Greater than 50% of the 55 minute visit was spent in counseling/coordination of care for the patient as laid out in the A&P.    LOS: 3 days   Dwyane Dee, MD [facility] 09/04/2022, 3:09 PM

## 2022-09-05 DIAGNOSIS — R609 Edema, unspecified: Secondary | ICD-10-CM

## 2022-09-05 DIAGNOSIS — K7030 Alcoholic cirrhosis of liver without ascites: Secondary | ICD-10-CM | POA: Diagnosis not present

## 2022-09-05 DIAGNOSIS — R17 Unspecified jaundice: Secondary | ICD-10-CM

## 2022-09-05 DIAGNOSIS — K7200 Acute and subacute hepatic failure without coma: Secondary | ICD-10-CM | POA: Diagnosis not present

## 2022-09-05 DIAGNOSIS — K7010 Alcoholic hepatitis without ascites: Secondary | ICD-10-CM | POA: Diagnosis not present

## 2022-09-05 LAB — COMPREHENSIVE METABOLIC PANEL
ALT: 52 U/L — ABNORMAL HIGH (ref 0–44)
AST: 65 U/L — ABNORMAL HIGH (ref 15–41)
Albumin: 2.5 g/dL — ABNORMAL LOW (ref 3.5–5.0)
Alkaline Phosphatase: 135 U/L — ABNORMAL HIGH (ref 38–126)
Anion gap: 7 (ref 5–15)
BUN: 15 mg/dL (ref 6–20)
CO2: 26 mmol/L (ref 22–32)
Calcium: 8.3 mg/dL — ABNORMAL LOW (ref 8.9–10.3)
Chloride: 101 mmol/L (ref 98–111)
Creatinine, Ser: 0.41 mg/dL — ABNORMAL LOW (ref 0.44–1.00)
GFR, Estimated: >60 mL/min (ref >60)
Glucose, Bld: 102 mg/dL — ABNORMAL HIGH (ref 70–99)
Potassium: 3.4 mmol/L — ABNORMAL LOW (ref 3.5–5.1)
Sodium: 134 mmol/L — ABNORMAL LOW (ref 135–145)
Total Bilirubin: 15.5 mg/dL — ABNORMAL HIGH (ref 0.3–1.2)
Total Protein: 5.6 g/dL — ABNORMAL LOW (ref 6.5–8.1)

## 2022-09-05 LAB — CBC WITH DIFFERENTIAL/PLATELET
Abs Immature Granulocytes: 0.06 10*3/uL (ref 0.00–0.07)
Basophils Absolute: 0 10*3/uL (ref 0.0–0.1)
Basophils Relative: 0 %
Eosinophils Absolute: 0.1 10*3/uL (ref 0.0–0.5)
Eosinophils Relative: 1 %
HCT: 30.5 % — ABNORMAL LOW (ref 36.0–46.0)
Hemoglobin: 10.5 g/dL — ABNORMAL LOW (ref 12.0–15.0)
Immature Granulocytes: 1 %
Lymphocytes Relative: 10 %
Lymphs Abs: 1.1 10*3/uL (ref 0.7–4.0)
MCH: 36 pg — ABNORMAL HIGH (ref 26.0–34.0)
MCHC: 34.4 g/dL (ref 30.0–36.0)
MCV: 104.5 fL — ABNORMAL HIGH (ref 80.0–100.0)
Monocytes Absolute: 0.6 10*3/uL (ref 0.1–1.0)
Monocytes Relative: 5 %
Neutro Abs: 8.8 10*3/uL — ABNORMAL HIGH (ref 1.7–7.7)
Neutrophils Relative %: 83 %
Platelets: 148 10*3/uL — ABNORMAL LOW (ref 150–400)
RBC: 2.92 MIL/uL — ABNORMAL LOW (ref 3.87–5.11)
RDW: 17.2 % — ABNORMAL HIGH (ref 11.5–15.5)
WBC: 10.7 10*3/uL — ABNORMAL HIGH (ref 4.0–10.5)
nRBC: 0 % (ref 0.0–0.2)

## 2022-09-05 LAB — PROTIME-INR
INR: 2 — ABNORMAL HIGH (ref 0.8–1.2)
Prothrombin Time: 22.6 seconds — ABNORMAL HIGH (ref 11.4–15.2)

## 2022-09-05 LAB — CMV ANTIBODY, IGG (EIA): CMV Ab - IgG: <0.6 U/mL (ref 0.00–0.59)

## 2022-09-05 LAB — MAGNESIUM: Magnesium: 2.5 mg/dL — ABNORMAL HIGH (ref 1.7–2.4)

## 2022-09-05 LAB — VARICELLA ZOSTER ANTIBODY, IGG: Varicella IgG: 1905 index (ref >165)

## 2022-09-05 MED ORDER — FUROSEMIDE 10 MG/ML IJ SOLN
20.0000 mg | Freq: Once | INTRAMUSCULAR | Status: AC
  Administered 2022-09-05: 20 mg via INTRAVENOUS
  Filled 2022-09-05: qty 2

## 2022-09-05 MED ORDER — POTASSIUM CHLORIDE CRYS ER 20 MEQ PO TBCR
40.0000 meq | EXTENDED_RELEASE_TABLET | Freq: Once | ORAL | Status: AC
  Administered 2022-09-05: 40 meq via ORAL
  Filled 2022-09-05: qty 2

## 2022-09-05 MED ORDER — SPIRONOLACTONE 25 MG PO TABS
100.0000 mg | ORAL_TABLET | Freq: Every day | ORAL | Status: DC
  Administered 2022-09-06 – 2022-09-07 (×2): 100 mg via ORAL
  Filled 2022-09-05 (×2): qty 4

## 2022-09-05 MED ORDER — SPIRONOLACTONE 25 MG PO TABS
50.0000 mg | ORAL_TABLET | Freq: Once | ORAL | Status: AC
  Administered 2022-09-05: 50 mg via ORAL
  Filled 2022-09-05: qty 2

## 2022-09-05 MED ORDER — FUROSEMIDE 10 MG/ML IJ SOLN
40.0000 mg | Freq: Every day | INTRAMUSCULAR | Status: DC
  Administered 2022-09-06: 40 mg via INTRAVENOUS
  Filled 2022-09-05: qty 4

## 2022-09-05 MED ORDER — ALBUMIN HUMAN 25 % IV SOLN
25.0000 g | Freq: Four times a day (QID) | INTRAVENOUS | Status: AC
  Administered 2022-09-05 – 2022-09-06 (×4): 25 g via INTRAVENOUS
  Filled 2022-09-05 (×4): qty 100

## 2022-09-05 NOTE — TOC Progression Note (Signed)
Transition of Care [facility]) - Progression Note    Patient Details  Name: Jamie Coleman MRN: [id number] Date of Birth: 1971-05-02  Transition [facility] Care [facility]) CM/SW Contact  Henrietta Dine, RN Phone Number: 09/05/2022, 4:43 PM  Clinical Narrative:    [facility] consult for SA counseling/education; went to pt's room to complete TOC assessment; pt not in room; Gregary Signs, RN says the pt is in the healing garden; will attempt to complete later.        Expected Discharge Plan and Services                                                 Social Determinants of Health (SDOH) Interventions    Readmission Risk Interventions     No data to display

## 2022-09-05 NOTE — Progress Notes (Signed)
Progress Note    Jamie Coleman   BHA:[id number]  DOB: 1971-06-15  DOA: 09/01/2022     4 PCP: Abner Greenspan, MD  Initial CC: worsening liver function   Hospital Course: Jamie Coleman is a 51 yo female with PMH etoh hepatitis, cirrhosis, anxiety, HLD, GERD, prediabetes who presented upon referral from the GI office on 09/01/22. She had been under treatment for etoh hepatitis with prednisolone with some response, however TB began trending up as well as concern for decompensation, therefore she was referred to the ER for further workup and evaluation.   Interval History:  No events overnight. Edema seems to be improved some in legs; still endorsing abdominal swelling but is able to eat a little better and breathing has improved as well. Continues to ambulate well and mentation remains normal.   Assessment and Plan: * Acute liver failure without hepatic coma - on admission TB 16.8, PT/INR 26.7/2.6. AST/ALT 110/62, ALP 206 - s/p 10 mg Vit K on 11/21 followed by repeat 10 mg on 11/22 - trend PT/INR - prior hx etoh use; has been abstinent since ~08/17/22 - normal mentation; NH3 also normal on admission 29 - NAC initiated per GI given progressive hepatic dysfunction despite prior treatment; course to be completed 11/25 - possible evaluation for transplant pending GI discussions with transplant centers; tentatively planning for close outpatient followup with [facility] unless were to further decompensate requiring inpatient transfer (so far, remains stable/improving) - no alpha 1 AT deficiency noted; ceruloplasmin ULN - trend coags and LFTs -Lactulose started per GI.  If develops encephalopathy, will trend ammonia (has remained lucid)   Alcoholic hepatitis - previously treated last hospitalization with steroids with good response per [facility] model; however repeat MDF up to 84 points on admission  - continue thiamine, folic, MVI - prednisolone resumed per GI  Cirrhosis, alcoholic (HCC) - small  volume ascites noted on imaging; likely not enough for paracentesis; edema mostly present in LE and abdomen - continue trending LFTs - continue lactulose  - see ALF -Portal venous hypertension appreciated on MRCP.  Portosystemic collateral vessels noted.  Patient has undergone EGD and colonoscopy May 2023 - edema responding to diuretics and renal function tolerating; continue lasix and spironolactone (increased 11/25 per GI) - albumin seems to be helping at this time, will continue x 1 more day  Elevated lipase - Lipase 76 on admission - patient denies any N/V nor abdominal pain; MRCP notes edematous pancreas with peripancreatic fluid.  Clinically does not have pancreatitis.  Edematous findings possibly in the setting of tissue edema from hypoalbuminemia -Continue monitoring for any further developing signs of pancreatitis  Macrocytosis without anemia - MCV 104 - B12 1,940 on 11/21  Subclinical hypothyroidism - TSH 6.162, FT4 1.1 (normal range) - TSH likely elevated in setting of acute illness - repeat TSH in 4-6 week or once more medically stable  Anxiety disorder - patient request to change to xanax as ativan caused her to feel anxious  - caution for oversedation    Old records reviewed in assessment of this patient  Antimicrobials:   DVT prophylaxis:  SCDs Start: 09/02/22 0134   Code Status:   Code Status: Full Code  Mobility Assessment (last 72 hours)     Mobility Assessment     Row Name 09/04/22 1004 09/03/22 2030 09/03/22 1030 09/02/22 2030     Does patient have an order for bedrest or is patient medically unstable No - Continue assessment No - Continue assessment No - Continue  assessment No - Continue assessment    What is the highest level of mobility based on the progressive mobility assessment? Level 6 (Walks independently in room and hall) - Balance while walking in room without assist - Complete Level 6 (Walks independently in room and hall) - Balance while  walking in room without assist - Complete Level 6 (Walks independently in room and hall) - Balance while walking in room without assist - Complete Level 6 (Walks independently in room and hall) - Balance while walking in room without assist - Complete             Barriers to discharge:  Disposition Plan:  Home 1-2 days Status is: Inpt  Objective: Blood pressure (!) 105/58, pulse 92, temperature 98.1 F (36.7 C), temperature source Oral, resp. rate 18, height 5' 1.5" (1.562 m), weight 86.1 kg, SpO2 96 %.  Examination:  Physical Exam Constitutional:      General: She is not in acute distress.    Appearance: Normal appearance.  HENT:     Head: Normocephalic and atraumatic.     Mouth/Throat:     Mouth: Mucous membranes are moist.  Eyes:     General: Scleral icterus present.     Extraocular Movements: Extraocular movements intact.  Cardiovascular:     Rate and Rhythm: Normal rate and regular rhythm.  Pulmonary:     Effort: Pulmonary effort is normal. No respiratory distress.     Breath sounds: Normal breath sounds. No wheezing.  Abdominal:     General: Bowel sounds are normal. There is distension.     Palpations: Abdomen is soft.     Tenderness: There is no abdominal tenderness. There is no guarding.     Comments: Improved abdominal distension  Musculoskeletal:        General: Swelling present. Normal range of motion.     Cervical back: Normal range of motion and neck supple.     Comments: Persistent 2-3+ B/L LE pitting edema (starting to show improvement)  Skin:    General: Skin is warm.     Coloration: Skin is jaundiced.  Neurological:     General: No focal deficit present.     Mental Status: She is alert and oriented to person, place, and time.     Comments: No tremor or asterixis appreciated  Psychiatric:        Mood and Affect: Mood normal.        Behavior: Behavior normal.      Consultants:  GI  Procedures:    Data Reviewed: Results for orders placed or  performed during the hospital encounter of 09/01/22 (from the past 24 hour(s))  Comprehensive metabolic panel     Status: Abnormal   Collection Time: 09/05/22  5:21 AM  Result Value Ref Range   Sodium 134 (L) 135 - 145 mmol/L   Potassium 3.4 (L) 3.5 - 5.1 mmol/L   Chloride 101 98 - 111 mmol/L   CO2 26 22 - 32 mmol/L   Glucose, Bld 102 (H) 70 - 99 mg/dL   BUN 15 6 - 20 mg/dL   Creatinine, Ser 0.41 (L) 0.44 - 1.00 mg/dL   Calcium 8.3 (L) 8.9 - 10.3 mg/dL   Total Protein 5.6 (L) 6.5 - 8.1 g/dL   Albumin 2.5 (L) 3.5 - 5.0 g/dL   AST 65 (H) 15 - 41 U/L   ALT 52 (H) 0 - 44 U/L   Alkaline Phosphatase 135 (H) 38 - 126 U/L   Total Bilirubin 15.5 (  H) 0.3 - 1.2 mg/dL   GFR, Estimated >60 >60 mL/min   Anion gap 7 5 - 15  CBC with Differential/Platelet     Status: Abnormal   Collection Time: 09/05/22  5:21 AM  Result Value Ref Range   WBC 10.7 (H) 4.0 - 10.5 K/uL   RBC 2.92 (L) 3.87 - 5.11 MIL/uL   Hemoglobin 10.5 (L) 12.0 - 15.0 g/dL   HCT 30.5 (L) 36.0 - 46.0 %   MCV 104.5 (H) 80.0 - 100.0 fL   MCH 36.0 (H) 26.0 - 34.0 pg   MCHC 34.4 30.0 - 36.0 g/dL   RDW 17.2 (H) 11.5 - 15.5 %   Platelets 148 (L) 150 - 400 K/uL   nRBC 0.0 0.0 - 0.2 %   Neutrophils Relative % 83 %   Neutro Abs 8.8 (H) 1.7 - 7.7 K/uL   Lymphocytes Relative 10 %   Lymphs Abs 1.1 0.7 - 4.0 K/uL   Monocytes Relative 5 %   Monocytes Absolute 0.6 0.1 - 1.0 K/uL   Eosinophils Relative 1 %   Eosinophils Absolute 0.1 0.0 - 0.5 K/uL   Basophils Relative 0 %   Basophils Absolute 0.0 0.0 - 0.1 K/uL   Immature Granulocytes 1 %   Abs Immature Granulocytes 0.06 0.00 - 0.07 K/uL  Magnesium     Status: Abnormal   Collection Time: 09/05/22  5:21 AM  Result Value Ref Range   Magnesium 2.5 (H) 1.7 - 2.4 mg/dL  Protime-INR     Status: Abnormal   Collection Time: 09/05/22  5:21 AM  Result Value Ref Range   Prothrombin Time 22.6 (H) 11.4 - 15.2 seconds   INR 2.0 (H) 0.8 - 1.2    I have Reviewed nursing notes, Vitals, and Lab  results since pt's last encounter. Pertinent lab results : see above I have ordered test including BMP, CBC, Mg I have reviewed the last note from staff over past 24 hours I have discussed pt's care plan and test results with nursing staff, case manager  Time spent: Greater than 50% of the 55 minute visit was spent in counseling/coordination of care for the patient as laid out in the A&P.    LOS: 4 days   Dwyane Dee, MD [facility] 09/05/2022, 2:31 PM

## 2022-09-05 NOTE — Progress Notes (Signed)
[facility] Progress Note  Chief Complaint: Cholestatic jaundice from acute alcoholic hepatitis  History: Jamie Coleman is well-known to my partner Dr. Rush Landmark, who gave me extensive signout on her and within the case was discussed over the last couple of days.  Diego is clinically stable since yesterday's evaluation.  Her bleeding hemorrhoids seem to settle down the last day or 2.  She still has upper abdominal bloating and early satiety but is able to keep down small amounts of food and liquid. Husband at the bedside for entire visit. Decided to take the lactulose just once yesterday because she felt that was sufficient. ROS: Cardiovascular: No chest pain Respiratory: No dyspnea Urinary: No dysuria  Objective:   Current Facility-Administered Medications:    [COMPLETED] acetylcysteine (ACETADOTE) 30.5 mg/mL load via infusion 13,200 mg, 150 mg/kg, Intravenous, Once, 13,200 mg at 09/02/22 1335 **FOLLOWED BY** [EXPIRED] acetylcysteine (ACETADOTE) 18,000 mg in dextrose 5 % 590 mL (30.5085 mg/mL) infusion, 12.5 mg/kg/hr, Intravenous, Continuous, Stopping previously hung infusion at 09/02/22 2243 **FOLLOWED BY** acetylcysteine (ACETADOTE) 18,000 mg in dextrose 5 % 590 mL (30.5085 mg/mL) infusion, 6.25 mg/kg/hr, Intravenous, Continuous, Shade, Haze Justin, RPH, Last Rate: 18.03 mL/hr at 09/04/22 0652, 6.25 mg/kg/hr at 09/04/22 0652   albumin human 25 % solution 25 g, 25 g, Intravenous, Q6H, Green, Terri L, RPH, Last Rate: 60 mL/hr at 09/05/22 0637, 25 g at 09/05/22 0637   albuterol (PROVENTIL) (2.5 MG/3ML) 0.083% nebulizer solution 2.5 mg, 2.5 mg, Nebulization, Q2H PRN, Clance Boll, MD   ALPRAZolam Duanne Moron) tablet 0.5 mg, 0.5 mg, Oral, BID PRN, Dwyane Dee, MD, 0.5 mg at 09/05/22 0010   ascorbic acid (VITAMIN C) tablet 1,000 mg, 1,000 mg, Oral, Daily, Dwyane Dee, MD, 1,000 mg at 26/71/24 5809   folic acid (FOLVITE) tablet 1 mg, 1 mg, Oral, Daily, Girguis, David, MD, 1 mg at 09/05/22 1042    furosemide (LASIX) injection 20 mg, 20 mg, Intravenous, Once, Doran Stabler, MD   [START ON 09/06/2022] furosemide (LASIX) injection 40 mg, 40 mg, Intravenous, Daily, Danis, Estill Cotta III, MD   hydrocortisone (ANUSOL-HC) 2.5 % rectal cream, , Rectal, Daily, Willia Craze, NP, Given at 09/05/22 1103   lactulose (Jeddo) 10 GM/15ML solution 20 g, 20 g, Oral, Daily, Tye Savoy M, NP, 10 g at 09/04/22 2200   multivitamin with minerals tablet 1 tablet, 1 tablet, Oral, Daily, Dwyane Dee, MD, 1 tablet at 09/05/22 1041   ondansetron (ZOFRAN) tablet 4 mg, 4 mg, Oral, Q6H PRN **OR** ondansetron (ZOFRAN) injection 4 mg, 4 mg, Intravenous, Q6H PRN, Clance Boll, MD   pantoprazole (PROTONIX) EC tablet 40 mg, 40 mg, Oral, Daily, Girguis, David, MD, 40 mg at 09/05/22 1043   polyvinyl alcohol (LIQUIFILM TEARS) 1.4 % ophthalmic solution 1-2 drop, 1-2 drop, Both Eyes, QID PRN, Dwyane Dee, MD   prednisoLONE tablet 40 mg, 40 mg, Oral, QAC breakfast, Mansouraty, Telford Nab., MD, 40 mg at 09/05/22 1057   protein supplement (ENSURE MAX) liquid, 11 oz, Oral, BID, Myles Rosenthal A, MD, 11 oz at 09/05/22 1033   simethicone (MYLICON) chewable tablet 240 mg, 240 mg, Oral, BID PRN, Dwyane Dee, MD   Derrill Memo ON 09/06/2022] spironolactone (ALDACTONE) tablet 100 mg, 100 mg, Oral, Daily, Danis, Estill Cotta III, MD   spironolactone (ALDACTONE) tablet 50 mg, 50 mg, Oral, Once, Danis, Estill Cotta III, MD   thiamine (VITAMIN B1) tablet 500 mg, 500 mg, Oral, Daily, Dwyane Dee, MD, 500 mg at 09/05/22 1101   acetylcysteine 6.25 mg/kg/hr (09/04/22  7673)   albumin human 25 g (09/05/22 4193)     Vital signs in last 24 hrs: Vitals:   09/04/22 2157 09/05/22 0553  BP: (!) 104/59 (!) 101/56  Pulse: 81 81  Resp: (!) 21 17  Temp: 98.4 F (36.9 C) 98.3 F (36.8 C)  SpO2: 96% 97%    Intake/Output Summary (Last 24 hours) at 09/05/2022 1231 Last data filed at 09/04/2022 1330 Gross per 24 hour  Intake 240  ml  Output --  Net 240 ml     Physical Exam  Alert, good spirits, normal mentation and gross motor function, no asterixis, speech fluent Remains deeply jaundiced and icteric Cardiac: RRR without murmurs, S1S2 heard, pitting edema to thigh bilaterally Pulm: clear to auscultation bilaterally, normal RR and effort noted Abdomen: soft, mild bandlike upper tenderness, with active bowel sounds. No guarding or palpable hepatosplenomegaly Skin; warm and dry, jaundice, spider nevi upper chest wall  Recent Labs:     Latest Ref Rng & Units 09/05/2022    5:21 AM 09/04/2022    7:30 AM 09/03/2022    5:24 AM  CBC  WBC 4.0 - 10.5 K/uL 10.7  14.4  16.5   Hemoglobin 12.0 - 15.0 g/dL 10.5  13.1  14.1   Hematocrit 36.0 - 46.0 % 30.5  37.6  40.5   Platelets 150 - 400 K/uL 148  192  220     Recent Labs  Lab 09/05/22 0521  INR 2.0*      Latest Ref Rng & Units 09/05/2022    5:21 AM 09/04/2022    7:30 AM 09/03/2022    5:24 AM  CMP  Glucose 70 - 99 mg/dL 102  119  134   BUN 6 - 20 mg/dL '15  13  11   '$ Creatinine 0.44 - 1.00 mg/dL 0.41  0.51  0.50   Sodium 135 - 145 mmol/L 134  133  134   Potassium 3.5 - 5.1 mmol/L 3.4  3.6  3.6   Chloride 98 - 111 mmol/L 101  99  101   CO2 22 - 32 mmol/L '26  25  24   '$ Calcium 8.9 - 10.3 mg/dL 8.3  8.5  8.2   Total Protein 6.5 - 8.1 g/dL 5.6  6.2  6.4   Total Bilirubin 0.3 - 1.2 mg/dL 15.5  18.9  19.1   Alkaline Phos 38 - 126 U/L 135  170  184   AST 15 - 41 U/L 65  86  158   ALT 0 - 44 U/L 52  73  82      Radiologic studies:   Assessment & Plan  Assessment: Protracted course of severe alcohol related hepatitis on prednisolone.  Mild thrombocytopenia, modest coagulopathy, severe hyperbilirubinemia that is fortunately starting to improve.  She is making urine, creatinine remains normal, so we are thankful for that.  This will allow slow increase in diuretics to help with her volume overload.  No evidence GI bleeding or encephalopathy.  Currently on  lactulose with good effect.  Plan: Increase spironolactone from 50 mg daily to 100 mg daily (additional 50 mg dose given today)  Increase furosemide from 20 mg IV once daily to 40 mg IV once daily (additional 20 mg dose given this morning)  Monitor CMP and INR tomorrow morning.  Other supportive care as noted above.  She is hopeful for discharge in the next couple of days if things remain stable and renal function tolerates increase of diuretics.  N-acetylcysteine treatment course will  finish today.  Dr. Rush Landmark has been in communications with liver transplant evaluation team at [facility] in hopes of expediting an outpatient visit soon.  We will follow closely.   35 minutes were spent on this encounter (including chart review, history/exam, counseling/coordination of care, and documentation) > 50% of that time was spent on counseling and coordination of care. (High medical decision making)   Nelida Meuse III Office: [phone]

## 2022-09-06 ENCOUNTER — Telehealth: Payer: Self-pay

## 2022-09-06 DIAGNOSIS — K7200 Acute and subacute hepatic failure without coma: Secondary | ICD-10-CM | POA: Diagnosis not present

## 2022-09-06 DIAGNOSIS — K7010 Alcoholic hepatitis without ascites: Secondary | ICD-10-CM | POA: Diagnosis not present

## 2022-09-06 DIAGNOSIS — K7030 Alcoholic cirrhosis of liver without ascites: Secondary | ICD-10-CM | POA: Diagnosis not present

## 2022-09-06 LAB — CBC WITH DIFFERENTIAL/PLATELET
Abs Immature Granulocytes: 0.12 10*3/uL — ABNORMAL HIGH (ref 0.00–0.07)
Basophils Absolute: 0 10*3/uL (ref 0.0–0.1)
Basophils Relative: 0 %
Eosinophils Absolute: 0.1 10*3/uL (ref 0.0–0.5)
Eosinophils Relative: 1 %
HCT: 30.6 % — ABNORMAL LOW (ref 36.0–46.0)
Hemoglobin: 10.5 g/dL — ABNORMAL LOW (ref 12.0–15.0)
Immature Granulocytes: 1 %
Lymphocytes Relative: 9 %
Lymphs Abs: 1 10*3/uL (ref 0.7–4.0)
MCH: 35.8 pg — ABNORMAL HIGH (ref 26.0–34.0)
MCHC: 34.3 g/dL (ref 30.0–36.0)
MCV: 104.4 fL — ABNORMAL HIGH (ref 80.0–100.0)
Monocytes Absolute: 0.7 10*3/uL (ref 0.1–1.0)
Monocytes Relative: 6 %
Neutro Abs: 9.8 10*3/uL — ABNORMAL HIGH (ref 1.7–7.7)
Neutrophils Relative %: 83 %
Platelets: 148 10*3/uL — ABNORMAL LOW (ref 150–400)
RBC: 2.93 MIL/uL — ABNORMAL LOW (ref 3.87–5.11)
RDW: 17.3 % — ABNORMAL HIGH (ref 11.5–15.5)
WBC: 11.7 10*3/uL — ABNORMAL HIGH (ref 4.0–10.5)
nRBC: 0 % (ref 0.0–0.2)

## 2022-09-06 LAB — COMPREHENSIVE METABOLIC PANEL
ALT: 50 U/L — ABNORMAL HIGH (ref 0–44)
AST: 77 U/L — ABNORMAL HIGH (ref 15–41)
Albumin: 3.3 g/dL — ABNORMAL LOW (ref 3.5–5.0)
Alkaline Phosphatase: 120 U/L (ref 38–126)
Anion gap: 12 (ref 5–15)
BUN: 16 mg/dL (ref 6–20)
CO2: 25 mmol/L (ref 22–32)
Calcium: 8.7 mg/dL — ABNORMAL LOW (ref 8.9–10.3)
Chloride: 99 mmol/L (ref 98–111)
Creatinine, Ser: 0.43 mg/dL — ABNORMAL LOW (ref 0.44–1.00)
GFR, Estimated: >60 mL/min (ref >60)
Glucose, Bld: 100 mg/dL — ABNORMAL HIGH (ref 70–99)
Potassium: 3.7 mmol/L (ref 3.5–5.1)
Sodium: 136 mmol/L (ref 135–145)
Total Bilirubin: 14.8 mg/dL — ABNORMAL HIGH (ref 0.3–1.2)
Total Protein: 6 g/dL — ABNORMAL LOW (ref 6.5–8.1)

## 2022-09-06 LAB — CULTURE, BLOOD (ROUTINE X 2)
Culture: NO GROWTH
Culture: NO GROWTH
Special Requests: ADEQUATE
Special Requests: ADEQUATE

## 2022-09-06 LAB — GLUCOSE, CAPILLARY: Glucose-Capillary: 98 mg/dL (ref 70–99)

## 2022-09-06 LAB — PROTIME-INR
INR: 2 — ABNORMAL HIGH (ref 0.8–1.2)
Prothrombin Time: 22.2 seconds — ABNORMAL HIGH (ref 11.4–15.2)

## 2022-09-06 LAB — MAGNESIUM: Magnesium: 2.7 mg/dL — ABNORMAL HIGH (ref 1.7–2.4)

## 2022-09-06 MED ORDER — BUMETANIDE 1 MG PO TABS
1.0000 mg | ORAL_TABLET | Freq: Every day | ORAL | Status: DC
  Administered 2022-09-07: 1 mg via ORAL
  Filled 2022-09-06: qty 1

## 2022-09-06 NOTE — TOC Initial Note (Addendum)
Transition of Care [facility]) - Initial/Assessment Note    Patient Details  Name: Jamie Coleman MRN: [id number] Date of Birth: Feb 25, 1971  [facility] CM/SW Contact:    Henrietta Dine, RN Phone Number: 09/06/2022, 1:54 PM  Clinical Narrative:                 [facility] consult for SA counseling/education; spoke with pt in room; the pt says she is from home with her husband and she plans to return; she says she has transportation; the pt says she wears glasses but does not have dentures or hearing aids; she says she does not have DME or [place] service; the pt agrees to receiving resource for SA counseling/education; resource placed in d/c instructions; pt also given printed copy of resources; she will make her own appt; the pt also inquired about obtaining a new therapist because she has been seeing one on line; the pt says she would like to have lives sessions; she says she has asked her husband's therapist to refer her to a provider in that group; pt encouraged to notify her PCP because they may be able to make recommendations; she verbalized understanding; TOC will con't to follow.  Expected Discharge Plan: Home/Self Care Barriers to Discharge: Continued Medical Work up   Patient Goals and CMS Choice Patient states their goals for this hospitalization and ongoing recovery are:: home      Expected Discharge Plan and Services Expected Discharge Plan: Home/Self Care   Discharge Planning Services: CM Consult   Living arrangements for the past 2 months: Single Family Home                                      Prior Living Arrangements/Services Living arrangements for the past 2 months: Single Family Home Lives with:: Spouse Patient language and need for interpreter reviewed:: Yes Do you feel safe going back to the place where you live?: Yes      Need for Family Participation in Patient Care: Yes (Comment) Care giver support system in place?: Yes (comment)    Criminal Activity/Legal Involvement Pertinent to Current Situation/Hospitalization: No - Comment as needed  Activities of Daily Living Home Assistive Devices/Equipment: Eyeglasses ADL Screening (condition at time of admission) Patient's cognitive ability adequate to safely complete daily activities?: Yes Is the patient deaf or have difficulty hearing?: No Does the patient have difficulty seeing, even when wearing glasses/contacts?: No Does the patient have difficulty concentrating, remembering, or making decisions?: No Patient able to express need for assistance with ADLs?: Yes Does the patient have difficulty dressing or bathing?: No Independently performs ADLs?: Yes (appropriate for developmental age) Does the patient have difficulty walking or climbing stairs?: No Weakness of Legs: None Weakness of Arms/Hands: None  Permission Sought/Granted Permission sought to share information with : Case Manager Permission granted to share information with : Yes, Verbal Permission Granted  Share Information with NAME: Lenor Coffin, RN, CM           Emotional Assessment Appearance:: Appears stated age   Affect (typically observed): Accepting Orientation: : Oriented to Self, Oriented to Place Alcohol / Substance Use: Tobacco Use Psych Involvement: No (comment)  Admission diagnosis:  Liver failure ([place]) [K72.90] Jaundice [R17] Patient Active Problem List   Diagnosis Date Noted   Cirrhosis, alcoholic ([place]) 28/41/3244   Macrocytosis without anemia 09/02/2022   Elevated lipase 09/02/2022   Acute liver failure  without hepatic coma 09/01/2022   Shortness of breath 05/05/2022   Bronchitis due to COVID-19 virus 05/05/2022   Alcohol dependence ([place]) 33/54/5625   Alcoholic hepatitis 63/89/3734   Alcohol withdrawal syndrome without complication (HCC)    Elevated liver transaminase level 09/03/2019   Subclinical hypothyroidism 09/03/2019   Vitamin D deficiency 03/02/2018   Fatigue  03/02/2018   Pupil asymmetry 08/04/2016   Hemorrhoids 12/28/2014   Prediabetes 08/28/2014   Tremor 06/30/2013   Sebaceous cyst 02/15/2013   OTHER&UNSPECIFIED DISEASES THE ORAL SOFT TISSUES 07/25/2008   DERMATOFIBROMA, ARM 11/11/2007   NEOPLASM, SKIN, UNCERTAIN BEHAVIOR 28/76/8115   Hyperlipidemia 02/10/2007   Anxiety disorder 02/10/2007   ALLERGIC RHINITIS 02/10/2007   GERD 02/10/2007   IBS 02/10/2007   PCP:  Abner Greenspan, MD Pharmacy:   [facility] [place] [address] Phone: [phone] Fax: [phone]     Social Determinants of Health (SDOH) Interventions    Readmission Risk Interventions     No data to display

## 2022-09-06 NOTE — Telephone Encounter (Signed)
-----   Message from Irving Copas., MD sent at 09/05/2022 12:00 PM EST ----- Regarding: Follow-up Jamie Coleman, On Monday, please place an urgent referral to transplant hepatology at [facility].  Please place name of consultant Dr. Laurier Nancy.  Any of the other providers in the transplant group are fine as well.  But I have been speaking with Dr. Merrilee Jansky over the last week in regards to this patient. Alcoholic cirrhosis cirrhosis with decompensation as a result of severe alcoholic hepatitis and referral for consideration of transplant candidacy. Thanks. GM

## 2022-09-06 NOTE — Discharge Instructions (Signed)
Patient will make her own appointment with resource agency.

## 2022-09-06 NOTE — Progress Notes (Signed)
Gastroenterology Inpatient Follow-up Note   PATIENT IDENTIFICATION  DEWAYNE JUREK is a 51 y.o. female with a pmh significant for alcoholic liver disease (now felt to be cirrhosis with EV's on radiologic imaging) with recent diagnosis of alcoholic hepatitis, early remission of alcohol use disorder, anxiety, diverticulosis, hemorrhoids.  Patient admitted with acute on chronic liver disease secondary to severe alcoholic hepatitis. Hospital Day: 6  SUBJECTIVE  The patient's chart has been reviewed. The patient's labs have been reviewed.  Her INR remains stable at 2.0 and her blood counts are stable.  Her electrolytes (CMP are still pending from 5 this morning). Patient otherwise in good spirits.  She ate half of her omelette this morning. She is still worried about her overall clinical status but seems to be feeling that things are stable. She received her dose of IV Lasix today. Her weight has been stable from yesterday into today even with increased spironolactone. Lower extremity swelling is slightly better than before. The patient denies any issues with generalized pruritus, hematemesis, coffee-ground emesis, confusion.   OBJECTIVE  Scheduled Inpatient Medications:   vitamin C  1,000 mg Oral Daily   folic acid  1 mg Oral Daily   furosemide  40 mg Intravenous Daily   hydrocortisone   Rectal Daily   lactulose  20 g Oral Daily   multivitamin with minerals  1 tablet Oral Daily   pantoprazole  40 mg Oral Daily   prednisoLONE  40 mg Oral QAC breakfast   Ensure Max Protein  11 oz Oral BID   spironolactone  100 mg Oral Daily   thiamine  500 mg Oral Daily   Continuous Inpatient Infusions:   albumin human 25 g (09/06/22 0508)   PRN Inpatient Medications: albuterol, ALPRAZolam, ondansetron **OR** ondansetron (ZOFRAN) IV, polyvinyl alcohol, simethicone   Physical Examination  Temp:  [97.5 F (36.4 C)-98.1 F (36.7 C)] 97.5 F (36.4 C) (11/26 0505) Pulse Rate:  [74-92] 74 (11/26  0505) Resp:  [16-18] 16 (11/26 0505) BP: (104-119)/(58-67) 110/67 (11/26 0850) SpO2:  [96 %-99 %] 98 % (11/26 0505) Weight:  [86.5 kg] 86.5 kg (11/26 0500) Temp (24hrs), Avg:97.9 F (36.6 C), Min:97.5 F (36.4 C), Max:98.1 F (36.7 C)  Weight: 86.5 kg GEN: NAD, husband at bedside, appears stated age, nontoxic PSYCH: Cooperative, without pressured speech EYE: Icteric ENT: MMM CV: Nontachycardic RESP: No audible wheezing GI: Soft, protuberant abdomen, distended, nontender, without rebound or guarding MSK/EXT: 1+ bilateral pedal edema (slightly better than when I had seen her 2 days ago) with edema going up to the mid thigh SKIN: Jaundiced, spider angiomata present NEURO:  Alert & Oriented x 3, no focal deficits, no evidence of asterixis   Review of Data   Laboratory Studies   Recent Labs  Lab 09/05/22 0521  NA 134*  K 3.4*  CL 101  CO2 26  BUN 15  CREATININE 0.41*  GLUCOSE 102*  CALCIUM 8.3*  MG 2.5*   Recent Labs  Lab 09/05/22 0521  AST 65*  ALT 52*  ALKPHOS 135*    Recent Labs  Lab 09/04/22 0730 09/05/22 0521 09/06/22 0509  WBC 14.4* 10.7* 11.7*  HGB 13.1 10.5* 10.5*  HCT 37.6 30.5* 30.6*  PLT 192 148* 148*   Recent Labs  Lab 09/04/22 1047 09/05/22 0521 09/06/22 0509  INR 1.8* 2.0* 2.0*   MELD 3.0: 28 at 09/06/2022  5:09 AM MELD-Na: 26 at 09/06/2022  5:09 AM Calculated from: Serum Creatinine: 0.41 mg/dL (Using min of 1 mg/dL) at  09/05/2022  5:21 AM Serum Sodium: 134 mmol/L at 09/05/2022  5:21 AM Total Bilirubin: 15.5 mg/dL at 09/05/2022  5:21 AM Serum Albumin: 2.5 g/dL at 09/05/2022  5:21 AM INR(ratio): 2.0 at 09/06/2022  5:09 AM Age at listing (hypothetical): 51 years Sex: Female at 09/06/2022  5:09 AM  Imaging Studies  No results found.  GI Procedures and Studies  No new studies to review   ASSESSMENT  Ms. Hew is a 51 y.o. female with a pmh significant for alcoholic liver disease (now felt to be cirrhosis) with recent diagnosis  of alcoholic hepatitis, early remission of alcohol use disorder, anxiety, diverticulosis, hemorrhoids.  Patient admitted with acute on chronic liver disease secondary to severe alcoholic hepatitis.  The patient is hemodynamically stable at this time.  Clinically she looks to be doing okay as well.  She has been receiving IV loop diuretic in addition to spironolactone which was uptitrated yesterday.  I called the laboratory and it looks like they are having some issues with an analyzer so that is why her labs have not returned.  I think if her kidney function has not significantly changed and her electrolytes allow then maybe she could be initiated on an oral diuretic regimen with plan for potential discharge on Monday or Tuesday.  Certainly will need to see how electrolytes and kidney function are doing.  On Monday my team will be putting in the urgent referral to [facility] hepatology transplant but they are already aware of her and will be looking for her in an effort of trying to get her evaluated for potential transplant candidacy.  Hopefully, we see that her liver tests are improving with the repeat liver test today, time will tell.  I greatly appreciate the medicine service with taking care of my patient.   PLAN/RECOMMENDATIONS  IV/p.o. diuresis as is already been scheduled for today Consider p.o. diuretic regimen to start on Monday or Tuesday (pending electrolytes and kidney function) - Lasix 40 mg twice daily --(Okay for Bumex or torsemide if felt to be worthwhile with conversion from a loop diuretic standpoint) - Spironolactone 50 mg twice daily MVI/thiamine/folate daily Continue prednisolone 40 mg daily (will complete a full 30-day course) Daily standing weight should be obtained 1500 mg sodium diet Lactulose once daily as needed (titrate to 2-3 bowel movements daily) Patient will need HAV vaccination in future Promote intake of 1.5 g/kg/day of Protein (Ensures/Boosts at each meal or as per  dietary) Patient will benefit from liver transplant candidacy evaluation hopefully this will be able to be done as an expedited outpatient with [facility] transplant hepatology versus if patient has clinical deterioration/decompensation the need for inpatient transfer for evaluation (we have already received confirmation that her insurance is excepted for transplant evaluation)   Thank you medicine service for great care of my patient.  Dr. Lyndel Safe, my partner will be taking over her case starting tomorrow.  Please page/call with questions or concerns.   Justice Britain, MD [facility] Office # [phone]    LOS: 5 days  Irving Copas  09/06/2022, 10:44 AM

## 2022-09-06 NOTE — Progress Notes (Signed)
Progress Note    Jamie Coleman   FIE:[id number]  DOB: October 05, 1971  DOA: 09/01/2022     5 PCP: Abner Greenspan, MD  Initial CC: worsening liver function   Hospital Course: Ms. Jamie Coleman is a 51 yo female with PMH etoh hepatitis, cirrhosis, anxiety, HLD, GERD, prediabetes who presented upon referral from the GI office on 09/01/22. She had been under treatment for etoh hepatitis with prednisolone with some response, however TB began trending up as well as concern for decompensation, therefore she was referred to the ER for further workup and evaluation.   Interval History:  No events overnight.  Edema still improving some in legs.  Compression stockings in place today.  Abdomen (notably flanks) remains edematous but breathing status still stable.  Assessment and Plan: * Acute liver failure without hepatic coma - on admission TB 16.8, PT/INR 26.7/2.6. AST/ALT 110/62, ALP 206 - s/p 10 mg Vit K on 11/21 followed by repeat 10 mg on 11/22 - trend PT/INR - prior hx etoh use; has been abstinent since ~08/17/22 - normal mentation; NH3 also normal on admission 29 - NAC initiated per GI given progressive hepatic dysfunction despite prior treatment; course to be completed 11/25 - possible evaluation for transplant pending GI discussions with transplant centers; tentatively planning for close outpatient followup with [facility] unless were to further decompensate requiring inpatient transfer (so far, remains stable/improving) - no alpha 1 AT deficiency noted; ceruloplasmin ULN - trend coags and LFTs -Lactulose started per GI.  If develops encephalopathy, will trend ammonia (has remained lucid)   Alcoholic hepatitis - previously treated last hospitalization with steroids with good response per [facility] model; however repeat MDF up to 84 points on admission  - continue thiamine, folic, MVI - prednisolone resumed per GI  Cirrhosis, alcoholic (HCC) - small volume ascites noted on imaging; likely not enough  for paracentesis; edema mostly present in LE and abdomen - continue trending LFTs - continue lactulose  - see ALF -Portal venous hypertension appreciated on MRCP.  Portosystemic collateral vessels noted.  Patient has undergone EGD and colonoscopy May 2023 - edema responding to diuretics and renal function tolerating - remains on spironolactone '100mg'$  daily; will plan on starting bumex 1 mg daily on 11/27 (equiv to lasix 40 PO) which will maintain 5:2 ratio  Macrocytosis without anemia - MCV 104 - B12 1,940 on 11/21  Subclinical hypothyroidism - TSH 6.162, FT4 1.1 (normal range) - TSH likely elevated in setting of acute illness - repeat TSH in 4-6 week or once more medically stable  Anxiety disorder - patient request to change to xanax as ativan caused her to feel anxious  - caution for oversedation   Elevated lipase-resolved as of 09/06/2022 - Lipase 76 on admission - patient denies any N/V nor abdominal pain; MRCP notes edematous pancreas with peripancreatic fluid.  Clinically does not have pancreatitis.  Edematous findings possibly in the setting of tissue edema from hypoalbuminemia -Continue monitoring for any further developing signs of pancreatitis; no signs/symptoms of pancreatitis developed   Old records reviewed in assessment of this patient  Antimicrobials:   DVT prophylaxis:  SCDs Start: 09/02/22 0134   Code Status:   Code Status: Full Code  Mobility Assessment (last 72 hours)     Mobility Assessment     Row Name 09/04/22 1004 09/03/22 2030         Does patient have an order for bedrest or is patient medically unstable No - Continue assessment No - Continue assessment  What is the highest level of mobility based on the progressive mobility assessment? Level 6 (Walks independently in room and hall) - Balance while walking in room without assist - Complete Level 6 (Walks independently in room and hall) - Balance while walking in room without assist - Complete                Barriers to discharge:  Disposition Plan:  Home possibly Monday or Tuesday Status is: Inpt  Objective: Blood pressure 112/71, pulse 95, temperature 97.9 F (36.6 C), temperature source Oral, resp. rate 18, height 5' 1.5" (1.562 m), weight 86.5 kg, SpO2 97 %.  Examination:  Physical Exam Constitutional:      General: She is not in acute distress.    Appearance: Normal appearance.  HENT:     Head: Normocephalic and atraumatic.     Mouth/Throat:     Mouth: Mucous membranes are moist.  Eyes:     General: Scleral icterus present.     Extraocular Movements: Extraocular movements intact.  Cardiovascular:     Rate and Rhythm: Normal rate and regular rhythm.  Pulmonary:     Effort: Pulmonary effort is normal. No respiratory distress.     Breath sounds: Normal breath sounds. No wheezing.  Abdominal:     General: Bowel sounds are normal. There is distension.     Palpations: Abdomen is soft.     Tenderness: There is no abdominal tenderness. There is no guarding.     Comments: Improved abdominal distension  Musculoskeletal:        General: Swelling present. Normal range of motion.     Cervical back: Normal range of motion and neck supple.     Comments: Bilateral lower extremity pitting edema has improved from 3+ down to 2+  Skin:    General: Skin is warm.     Coloration: Skin is jaundiced.  Neurological:     General: No focal deficit present.     Mental Status: She is alert and oriented to person, place, and time.     Comments: No tremor or asterixis appreciated  Psychiatric:        Mood and Affect: Mood normal.        Behavior: Behavior normal.      Consultants:  GI  Procedures:    Data Reviewed: Results for orders placed or performed during the hospital encounter of 09/01/22 (from the past 24 hour(s))  Comprehensive metabolic panel     Status: Abnormal   Collection Time: 09/06/22  5:09 AM  Result Value Ref Range   Sodium 136 135 - 145 mmol/L    Potassium 3.7 3.5 - 5.1 mmol/L   Chloride 99 98 - 111 mmol/L   CO2 25 22 - 32 mmol/L   Glucose, Bld 100 (H) 70 - 99 mg/dL   BUN 16 6 - 20 mg/dL   Creatinine, Ser 0.43 (L) 0.44 - 1.00 mg/dL   Calcium 8.7 (L) 8.9 - 10.3 mg/dL   Total Protein 6.0 (L) 6.5 - 8.1 g/dL   Albumin 3.3 (L) 3.5 - 5.0 g/dL   AST 77 (H) 15 - 41 U/L   ALT 50 (H) 0 - 44 U/L   Alkaline Phosphatase 120 38 - 126 U/L   Total Bilirubin 14.8 (H) 0.3 - 1.2 mg/dL   GFR, Estimated >60 >60 mL/min   Anion gap 12 5 - 15  CBC with Differential/Platelet     Status: Abnormal   Collection Time: 09/06/22  5:09 AM  Result Value Ref  Range   WBC 11.7 (H) 4.0 - 10.5 K/uL   RBC 2.93 (L) 3.87 - 5.11 MIL/uL   Hemoglobin 10.5 (L) 12.0 - 15.0 g/dL   HCT 30.6 (L) 36.0 - 46.0 %   MCV 104.4 (H) 80.0 - 100.0 fL   MCH 35.8 (H) 26.0 - 34.0 pg   MCHC 34.3 30.0 - 36.0 g/dL   RDW 17.3 (H) 11.5 - 15.5 %   Platelets 148 (L) 150 - 400 K/uL   nRBC 0.0 0.0 - 0.2 %   Neutrophils Relative % 83 %   Neutro Abs 9.8 (H) 1.7 - 7.7 K/uL   Lymphocytes Relative 9 %   Lymphs Abs 1.0 0.7 - 4.0 K/uL   Monocytes Relative 6 %   Monocytes Absolute 0.7 0.1 - 1.0 K/uL   Eosinophils Relative 1 %   Eosinophils Absolute 0.1 0.0 - 0.5 K/uL   Basophils Relative 0 %   Basophils Absolute 0.0 0.0 - 0.1 K/uL   Immature Granulocytes 1 %   Abs Immature Granulocytes 0.12 (H) 0.00 - 0.07 K/uL  Magnesium     Status: Abnormal   Collection Time: 09/06/22  5:09 AM  Result Value Ref Range   Magnesium 2.7 (H) 1.7 - 2.4 mg/dL  Protime-INR     Status: Abnormal   Collection Time: 09/06/22  5:09 AM  Result Value Ref Range   Prothrombin Time 22.2 (H) 11.4 - 15.2 seconds   INR 2.0 (H) 0.8 - 1.2  Glucose, capillary     Status: None   Collection Time: 09/06/22  8:21 AM  Result Value Ref Range   Glucose-Capillary 98 70 - 99 mg/dL    I have Reviewed nursing notes, Vitals, and Lab results since pt's last encounter. Pertinent lab results : see above I have ordered test including  BMP, CBC, Mg I have reviewed the last note from staff over past 24 hours I have discussed pt's care plan and test results with nursing staff, case manager  Time spent: Greater than 50% of the 55 minute visit was spent in counseling/coordination of care for the patient as laid out in the A&P.    LOS: 5 days   Dwyane Dee, MD [facility] 09/06/2022, 4:37 PM

## 2022-09-07 ENCOUNTER — Telehealth: Payer: Self-pay

## 2022-09-07 ENCOUNTER — Other Ambulatory Visit (HOSPITAL_COMMUNITY): Payer: Self-pay

## 2022-09-07 DIAGNOSIS — K7030 Alcoholic cirrhosis of liver without ascites: Secondary | ICD-10-CM

## 2022-09-07 DIAGNOSIS — K7200 Acute and subacute hepatic failure without coma: Secondary | ICD-10-CM

## 2022-09-07 DIAGNOSIS — E038 Other specified hypothyroidism: Secondary | ICD-10-CM

## 2022-09-07 DIAGNOSIS — K7010 Alcoholic hepatitis without ascites: Secondary | ICD-10-CM

## 2022-09-07 LAB — QUANTIFERON-TB GOLD PLUS: QuantiFERON-TB Gold Plus: UNDETERMINED — AB

## 2022-09-07 LAB — COMPREHENSIVE METABOLIC PANEL
ALT: 48 U/L — ABNORMAL HIGH (ref 0–44)
AST: 76 U/L — ABNORMAL HIGH (ref 15–41)
Albumin: 3.3 g/dL — ABNORMAL LOW (ref 3.5–5.0)
Alkaline Phosphatase: 117 U/L (ref 38–126)
Anion gap: 10 (ref 5–15)
BUN: 18 mg/dL (ref 6–20)
CO2: 24 mmol/L (ref 22–32)
Calcium: 8.8 mg/dL — ABNORMAL LOW (ref 8.9–10.3)
Chloride: 100 mmol/L (ref 98–111)
Creatinine, Ser: 0.53 mg/dL (ref 0.44–1.00)
GFR, Estimated: >60 mL/min (ref >60)
Glucose, Bld: 147 mg/dL — ABNORMAL HIGH (ref 70–99)
Potassium: 3.2 mmol/L — ABNORMAL LOW (ref 3.5–5.1)
Sodium: 134 mmol/L — ABNORMAL LOW (ref 135–145)
Total Bilirubin: 14.2 mg/dL — ABNORMAL HIGH (ref 0.3–1.2)
Total Protein: 6 g/dL — ABNORMAL LOW (ref 6.5–8.1)

## 2022-09-07 LAB — CBC WITH DIFFERENTIAL/PLATELET
Abs Immature Granulocytes: 0.11 10*3/uL — ABNORMAL HIGH (ref 0.00–0.07)
Basophils Absolute: 0 10*3/uL (ref 0.0–0.1)
Basophils Relative: 0 %
Eosinophils Absolute: 0.1 10*3/uL (ref 0.0–0.5)
Eosinophils Relative: 1 %
HCT: 29.7 % — ABNORMAL LOW (ref 36.0–46.0)
Hemoglobin: 10.3 g/dL — ABNORMAL LOW (ref 12.0–15.0)
Immature Granulocytes: 1 %
Lymphocytes Relative: 7 %
Lymphs Abs: 0.9 10*3/uL (ref 0.7–4.0)
MCH: 36.4 pg — ABNORMAL HIGH (ref 26.0–34.0)
MCHC: 34.7 g/dL (ref 30.0–36.0)
MCV: 104.9 fL — ABNORMAL HIGH (ref 80.0–100.0)
Monocytes Absolute: 0.7 10*3/uL (ref 0.1–1.0)
Monocytes Relative: 6 %
Neutro Abs: 11 10*3/uL — ABNORMAL HIGH (ref 1.7–7.7)
Neutrophils Relative %: 85 %
Platelets: 142 10*3/uL — ABNORMAL LOW (ref 150–400)
RBC: 2.83 MIL/uL — ABNORMAL LOW (ref 3.87–5.11)
RDW: 17.2 % — ABNORMAL HIGH (ref 11.5–15.5)
WBC: 12.9 10*3/uL — ABNORMAL HIGH (ref 4.0–10.5)
nRBC: 0 % (ref 0.0–0.2)

## 2022-09-07 LAB — QUANTIFERON-TB GOLD PLUS (RQFGPL)
QuantiFERON Mitogen Value: 0.25 IU/mL
QuantiFERON Nil Value: 0 IU/mL
QuantiFERON TB1 Ag Value: 0 IU/mL
QuantiFERON TB2 Ag Value: 0 IU/mL

## 2022-09-07 LAB — PROTIME-INR
INR: 2 — ABNORMAL HIGH (ref 0.8–1.2)
Prothrombin Time: 22.8 seconds — ABNORMAL HIGH (ref 11.4–15.2)

## 2022-09-07 LAB — MAGNESIUM: Magnesium: 2.4 mg/dL (ref 1.7–2.4)

## 2022-09-07 LAB — GLUCOSE, CAPILLARY: Glucose-Capillary: 129 mg/dL — ABNORMAL HIGH (ref 70–99)

## 2022-09-07 MED ORDER — POTASSIUM CHLORIDE CRYS ER 20 MEQ PO TBCR
40.0000 meq | EXTENDED_RELEASE_TABLET | Freq: Once | ORAL | Status: AC
  Administered 2022-09-07: 40 meq via ORAL
  Filled 2022-09-07: qty 2

## 2022-09-07 MED ORDER — BUMETANIDE 1 MG PO TABS
1.0000 mg | ORAL_TABLET | Freq: Every day | ORAL | 3 refills | Status: DC
  Filled 2022-09-07 (×2): qty 15, 15d supply, fill #0

## 2022-09-07 MED ORDER — SPIRONOLACTONE 100 MG PO TABS
100.0000 mg | ORAL_TABLET | Freq: Every day | ORAL | 3 refills | Status: DC
  Filled 2022-09-07: qty 30, 30d supply, fill #0

## 2022-09-07 MED ORDER — POTASSIUM CHLORIDE CRYS ER 20 MEQ PO TBCR
20.0000 meq | EXTENDED_RELEASE_TABLET | Freq: Every day | ORAL | 1 refills | Status: DC
  Filled 2022-09-07: qty 30, 30d supply, fill #0
  Filled 2022-09-07: qty 30, 30d supply, fill #1

## 2022-09-07 MED ORDER — LACTULOSE 10 GM/15ML PO SOLN
20.0000 g | Freq: Every day | ORAL | 0 refills | Status: DC
  Filled 2022-09-07: qty 236, 7d supply, fill #0

## 2022-09-07 MED ORDER — PREDNISOLONE SODIUM PHOSPHATE 15 MG/5ML PO SOLN
40.0000 mg | Freq: Every day | ORAL | 0 refills | Status: AC
  Filled 2022-09-07: qty 160, 12d supply, fill #0

## 2022-09-07 NOTE — Telephone Encounter (Signed)
-----  Message from Alfredia Ferguson, PA-C sent at 09/07/2022  1:42 PM EST ----- Regarding: labs and office follow up Patient is being discharged from the hospital today decompensated liver disease  She needs to have labs done on Wednesday, 09/09/2022 please put orders under Dr. Rush Landmark CBC, pro time/INR and c-Met.  She also needs to have an office follow-up arranged with Dr. Rush Landmark  first available You can message her tomorrow without appointment thank you

## 2022-09-07 NOTE — Progress Notes (Addendum)
Patient ID: Jamie Coleman, female   DOB: 09-Jun-1971, 51 y.o.   MRN: [id number]    Progress Note   Subjective   Day # 6  CC; severe alcoholic hepatitis, cirrhosis Prednisolone 40 mg daily-plan to complete 30-day course 09/17/2022 Lasix 40 twice daily Spironolactone 50 twice daily  M ELD 3.0= 28-on 09/06/2022 MELD-Na=26-on 09/06/2022  Husband at bedside, up eating lunch says she does not feel quite as full in her upper abdomen but still feels there is a lot of fluid in the upper abdomen and into the upper flanks, lower extremity edema improving They feel that he can manage at home, but are anxious about not being able to have labs checked frequently enough patient very concerned about possibility of needing to return to the hospital   Objective   Vital signs in last 24 hours: Temp:  [97.9 F (36.6 C)-98.8 F (37.1 C)] 98 F (36.7 C) (11/27 0639) Pulse Rate:  [82-95] 87 (11/27 0639) Resp:  [18] 18 (11/27 0639) BP: (107-115)/(57-71) 107/57 (11/27 0639) SpO2:  [97 %-98 %] 98 % (11/27 0639) Weight:  [85.4 kg] 85.4 kg (11/27 0500) Last BM Date : 09/06/22 General:   Jaundiced white female in NAD Heart:  Regular rate and rhythm; no murmurs Lungs: Respirations even and unlabored, lungs CTA bilaterally Abdomen:  Soft, protuberant ,normal bowel sounds.  No appreciable fluid wave liver is palpable in the epigastrium across the midline and down about 4 fingerbreadths below the costal margin Extremities: Trace to 1+ edema lower extremities bilaterally Neurologic:  Alert and oriented,  grossly normal neurologically. Psych:  Cooperative. Normal mood and affect.  Intake/Output from previous day: 11/26 0701 - 11/27 0700 In: 1420 [P.O.:1320; IV Piggyback:100] Out: -  Intake/Output this shift: No intake/output data recorded.  Lab Results: Recent Labs    09/05/22 0521 09/06/22 0509 09/07/22 0344  WBC 10.7* 11.7* 12.9*  HGB 10.5* 10.5* 10.3*  HCT 30.5* 30.6* 29.7*  PLT 148* 148* 142*    BMET Recent Labs    09/05/22 0521 09/06/22 0509 09/07/22 0344  NA 134* 136 134*  K 3.4* 3.7 3.2*  CL 101 99 100  CO2 '26 25 24  '$ GLUCOSE 102* 100* 147*  BUN '15 16 18  '$ CREATININE 0.41* 0.43* 0.53  CALCIUM 8.3* 8.7* 8.8*   LFT Recent Labs    09/07/22 0344  PROT 6.0*  ALBUMIN 3.3*  AST 76*  ALT 48*  ALKPHOS 117  BILITOT 14.2*   PT/INR Recent Labs    09/06/22 0509 09/07/22 0344  LABPROT 22.2* 22.8*  INR 2.0* 2.0*         Assessment / Plan:    #39 51 year old female with decompensated alcoholic liver disease, now felt to have cirrhosis on imaging, and has an enlarged liver on most recent CT.  Severe alcoholic hepatitis, relapsed EtOH abuse  Parameters are stable, bili gradually trending down Clinically she has not had any encephalopathy-continue lactulose 2 cc daily  She continues to complain of fullness/fluid in the upper abdomen I believe some of what she is sensing is hepatomegaly  Day #21 prednisolone/plan 30 days Continue thiamine, folic acid and multivitamin  MELD 3.0 =28  #2 volume overload-diuresing with Lasix and spironolactone Was switched to Bumex 1 mg daily today  We will need to continue daily weights at home, and strict 2 g sodium diet-addition advised Ensure or boost 2-3 times daily to increase protein intake  #3 mild hypokalemia-replacing #4 anxiety  Plan; discussed with Dr. Rush Landmark  patient is stable  for discharged home today To maintain strict 2 g sodium diet, with additional protein supplements as outlined above Complete alcohol abstinence And advised to make an appointment to see a psychologist/counselor to begin regular substance abuse counseling  We will plan to check labs through our office on Wednesday, 09/09/2022  Complete 30 days of prednisolone 40 mg-needs 9 more days Continue spironolactone 50 mg twice daily Continue Bumex 1 mg daily K-Dur 20 milligram once once daily Lactulose 30 g daily  Office has initiated  transplant evaluation via [facility] Will arrange for office follow-up with Dr. Rush Landmark     Principal Problem:   Acute liver failure without hepatic coma Active Problems:   Anxiety disorder   Subclinical hypothyroidism   Alcoholic hepatitis   Cirrhosis, alcoholic ([place])   Macrocytosis without anemia     LOS: 6 days   Amy EsterwoodPA-C  09/07/2022, 11:16 AM    Attending physician's note   I have taken history, reviewed the chart and examined the patient. I performed a substantive portion of this encounter, including complete performance of at least one of the key components, in conjunction with the APP. I agree with the Advanced Practitioner's note, impression and recommendations.   Reviewed signout from Dr. Rush Landmark.  Much better today. LFTs stable.  Expect slow improvement over next 4-6 weeks.  Low salt normal protein diet. Strict alcohol abstinence. Continue Bumex/Aldactone '1mg'$ /'100mg'$  po QD Daily weights Continue Kdur 40mq/day Lactulose 30 g p.o. daily. Not convinced that she has encephalopathy.  Hence decided to hold off on rifaximin currently. Plan to finish 30 days of prednisolone (until 12/7)  FU labs in 2-3 days Alcohol abstinence program/AA-highly motivated. [facility]. Start walking/exercises as tolerated D/W pt and husband OK for D/C home with close GI FU Will sign off for now   RCarmell Austria MD LVelora HecklerGI [phone]

## 2022-09-07 NOTE — Discharge Summary (Signed)
Physician Discharge Summary   Jamie Coleman QQI:[id number] DOB: 19-Aug-1971 DOA: 09/01/2022  PCP: Abner Greenspan, MD  Admit date: 09/01/2022 Discharge date: 09/07/2022  Barriers to discharge: none  Admitted From: Home Disposition:  Home Discharging physician: Dwyane Dee, MD  Recommendations for Outpatient Follow-up:  Follow-up with GI.  Patient referred to [facility] outpatient for transplant evaluation Repeat CMP at follow-up.  Patient started on Bumex, spironolactone, K-Dur Repeat TSH in 4 to 6 weeks  Home Health:  Equipment/Devices:   Discharge Condition: stable CODE STATUS: Full Diet recommendation:  Diet Orders (From admission, onward)     Start     Ordered   09/07/22 0000  Diet - low sodium heart healthy        09/07/22 1431   09/03/22 0809  Diet Heart Room service appropriate? Yes; Fluid consistency: Thin  Diet effective now       Question Answer Comment  Room service appropriate? Yes   Fluid consistency: Thin      09/03/22 0808            Hospital Course: Ms. Durall is a 51 yo female with PMH etoh hepatitis, cirrhosis, anxiety, HLD, GERD, prediabetes who presented upon referral from the GI office on 09/01/22. She had been under treatment for etoh hepatitis with prednisolone with some response, however TB began trending up as well as concern for decompensation, therefore she was referred to the ER for further workup and evaluation.  See below for further A&P  Assessment and Plan: * Acute liver failure without hepatic coma - on admission TB 16.8, PT/INR 26.7/2.6. AST/ALT 110/62, ALP 206 - s/p 10 mg Vit K on 11/21 followed by repeat 10 mg on 11/22 - trend PT/INR - prior hx etoh use; has been abstinent since ~08/17/22 - normal mentation; NH3 also normal on admission 29 - NAC initiated per GI given progressive hepatic dysfunction despite prior treatment; course to be completed 11/25 - possible evaluation for transplant pending GI discussions with transplant  centers; tentatively planning for close outpatient followup with [facility] unless were to further decompensate requiring inpatient transfer (so far, remains stable/improving).  Plan is for outpatient follow-up with [facility] - no alpha 1 AT deficiency noted; ceruloplasmin ULN - trend coags and LFTs -Lactulose started per GI.  If develops encephalopathy, will trend ammonia (has remained lucid) ; discharged with daily lactulose  Alcoholic hepatitis - previously treated last hospitalization with steroids with good response per [facility] model; however repeat MDF up to 84 points on admission  - continue thiamine, folic, MVI - prednisolone resumed per GI; 30-day course (total, including start date of 11/9 with prednisolone; was on prednisone prior to prednisolone) to continue at discharge.  Orapred prescribed  Cirrhosis, alcoholic ([place]) - small volume ascites noted on imaging; likely not enough for paracentesis; edema mostly present in LE and abdomen - continue trending LFTs; downtrending - continue lactulose  - see ALF -Portal venous hypertension appreciated on MRCP.  Portosystemic collateral vessels noted.  Patient has undergone EGD and colonoscopy May 2023 - edema responding to diuretics and renal function tolerating - remains on spironolactone '100mg'$  daily; will plan on starting bumex 1 mg daily on 11/27 (equiv to lasix 40 PO) which will maintain 5:2 ratio -Potassium remaining slightly low requiring replacement during hospitalization therefore continued on K-Dur 20 mEq daily at discharge  Macrocytosis without anemia - MCV 104 - B12 1,940 on 11/21  Subclinical hypothyroidism - TSH 6.162, FT4 1.1 (normal range) - TSH likely elevated in setting of acute  illness - repeat TSH in 4-6 week or once more medically stable  Anxiety disorder - patient request to change to xanax as ativan caused her to feel anxious  - caution for oversedation   Elevated lipase-resolved as of 09/06/2022 - Lipase 76 on  admission - patient denies any N/V nor abdominal pain; MRCP notes edematous pancreas with peripancreatic fluid.  Clinically does not have pancreatitis.  Edematous findings possibly in the setting of tissue edema from hypoalbuminemia -Continue monitoring for any further developing signs of pancreatitis; no signs/symptoms of pancreatitis developed       The patient's chronic medical conditions were treated accordingly per the patient's home medication regimen except as noted.  On day of discharge, patient was felt deemed stable for discharge. Patient/family member advised to call PCP or come back to ER if needed.   Principal Diagnosis: Acute liver failure without hepatic coma  Discharge Diagnoses: Active Hospital Problems   Diagnosis Date Noted   Acute liver failure without hepatic coma 09/01/2022    Priority: 1.   Alcoholic hepatitis 40/05/6760    Priority: 2.   Cirrhosis, alcoholic ([place]) 95/06/3266    Priority: 3.   Macrocytosis without anemia 09/02/2022   Subclinical hypothyroidism 09/03/2019   Anxiety disorder 02/10/2007    Resolved Hospital Problems   Diagnosis Date Noted Date Resolved   Elevated lipase 09/02/2022 09/06/2022     Discharge Instructions     Diet - low sodium heart healthy   Complete by: As directed    Increase activity slowly   Complete by: As directed       Allergies as of 09/07/2022       Reactions   Lipitor [atorvastatin] Other (See Comments)   myalgias        Medication List     STOP taking these medications    escitalopram 10 MG tablet Commonly known as: Lexapro   LORazepam 1 MG tablet Commonly known as: ATIVAN       TAKE these medications    bumetanide 1 MG tablet Commonly known as: BUMEX Take 1 tablet (1 mg total) by mouth daily. Start taking on: September 08, 2022   CALCIUM + D PO Take 1 tablet by mouth daily.   Constulose 10 GM/15ML solution Generic drug: lactulose Take 30 mLs (20 g total) by mouth daily.    fexofenadine 180 MG tablet Commonly known as: ALLEGRA Take 180 mg by mouth daily as needed for allergies.   folic acid 1 MG tablet Commonly known as: FOLVITE Take 1 mg by mouth daily.   hydrocortisone 25 MG suppository Commonly known as: ANUSOL-HC Place 1 suppository (25 mg total) rectally at bedtime as needed for hemorrhoids or anal itching. What changed: when to take this   Lumify 0.025 % Soln Generic drug: Brimonidine Tartrate Place 1 drop into both eyes as needed (brighten eyes).   multivitamin capsule Take 1 capsule by mouth daily.   omeprazole 20 MG capsule Commonly known as: PRILOSEC Take 20 mg by mouth daily.   potassium chloride SA 20 MEQ tablet Commonly known as: KLOR-CON M Take 1 tablet (20 mEq total) by mouth daily.   prednisoLONE 15 MG/5ML solution Commonly known as: ORAPRED Take 13.3 mLs (40 mg total) by mouth daily before breakfast for 12 days.   REFRESH OP Place 1-2 drops into both eyes 4 (four) times daily as needed (dry eye).   Simethicone 250 MG Caps Take 250 mg by mouth 2 (two) times daily as needed (flatulence).   spironolactone 100 MG  tablet Commonly known as: ALDACTONE Take 1 tablet (100 mg total) by mouth daily. Start taking on: September 08, 2022   thiamine 500 MG tablet Take 500 mg by mouth daily.   UNABLE TO FIND Apply 1 Application topically as needed (hemorrhoids). Med Name: Diltiazem 2% Lidocaine 2% ointment   vitamin C 1000 MG tablet Take 1,000 mg by mouth daily.   Vitamin D 50 MCG (2000 UT) Caps Take 2,000 Units by mouth daily.        Allergies  Allergen Reactions   Lipitor [Atorvastatin] Other (See Comments)    myalgias    Consultations: GI  Procedures:   Discharge Exam: BP (!) 107/57 (BP Location: Right Arm)   Pulse 87   Temp 98 F (36.7 C) (Oral)   Resp 18   Ht 5' 1.5" (1.562 m)   Wt 85.4 kg   SpO2 98%   BMI 35.00 kg/m  Physical Exam Constitutional:      General: She is not in acute distress.     Appearance: Normal appearance.  HENT:     Head: Normocephalic and atraumatic.     Mouth/Throat:     Mouth: Mucous membranes are moist.  Eyes:     General: Scleral icterus present.     Extraocular Movements: Extraocular movements intact.  Cardiovascular:     Rate and Rhythm: Normal rate and regular rhythm.  Pulmonary:     Effort: Pulmonary effort is normal. No respiratory distress.     Breath sounds: Normal breath sounds. No wheezing.  Abdominal:     General: Bowel sounds are normal. There is distension.     Palpations: Abdomen is soft.     Tenderness: There is no abdominal tenderness. There is no guarding.     Comments: Improved abdominal distension  Musculoskeletal:        General: Swelling present. Normal range of motion.     Cervical back: Normal range of motion and neck supple.     Comments: Bilateral lower extremity pitting edema has improved from 3+ down to 2+  Skin:    General: Skin is warm.     Coloration: Skin is jaundiced.  Neurological:     General: No focal deficit present.     Mental Status: She is alert and oriented to person, place, and time.     Comments: No tremor or asterixis appreciated  Psychiatric:        Mood and Affect: Mood normal.        Behavior: Behavior normal.      The results of significant diagnostics from this hospitalization (including imaging, microbiology, ancillary and laboratory) are listed below for reference.   Microbiology: Recent Results (from the past 240 hour(s))  Urine Culture     Status: Abnormal   Collection Time: 09/01/22  6:45 PM   Specimen: Urine, Clean Catch  Result Value Ref Range Status   Specimen Description   Final    URINE, CLEAN CATCH Performed at [facility], [place] [address] [place], [place] [address]    Special Requests   Final    NONE Performed at [facility], [place] [address] [place], [place] [address]    Culture 20,000 COLONIES/mL LACTOBACILLUS SPECIES (A)  Final    Report Status 09/03/2022 FINAL  Final  Blood culture (routine x 2)     Status: None   Collection Time: 09/01/22  8:30 PM   Specimen: BLOOD  Result Value Ref Range Status   Specimen Description   Final  BLOOD LEFT ANTECUBITAL Performed at [place] [place] [address], [place], [place] [address]    Special Requests   Final    BOTTLES DRAWN AEROBIC AND ANAEROBIC Blood Culture adequate volume Performed at [place] [address], [place], [place] [address]    Culture   Final    NO GROWTH 5 DAYS Performed at [facility], [place] [address] [place], [place] [address]    Report Status 09/06/2022 FINAL  Final  Blood culture (routine x 2)     Status: None   Collection Time: 09/01/22  8:55 PM   Specimen: BLOOD  Result Value Ref Range Status   Specimen Description   Final    BLOOD RIGHT ANTECUBITAL Performed at [place] [address], [place], [place] [address]    Special Requests   Final    BOTTLES DRAWN AEROBIC AND ANAEROBIC Blood Culture adequate volume Performed at [facility] [address], [place], [place] [address]    Culture   Final    NO GROWTH 5 DAYS Performed at [facility], [place] [address] [place], [place] [address]    Report Status 09/06/2022 FINAL  Final     Labs: BNP (last 3 results) Recent Labs    09/02/22 1331  BNP 970.2*   Basic Metabolic Panel: Recent Labs  Lab 09/03/22 0524 09/04/22 0730 09/05/22 0521 09/06/22 0509 09/07/22 0344  NA 134* 133* 134* 136 134*  K 3.6 3.6 3.4* 3.7 3.2*  CL 101 99 101 99 100  CO2 '24 25 26 25 24  '$ GLUCOSE 134* 119* 102* 100* 147*  BUN '11 13 15 16 18  '$ CREATININE 0.50 0.51 0.41* 0.43* 0.53  CALCIUM 8.2* 8.5* 8.3* 8.7* 8.8*  MG 2.7* 2.8* 2.5* 2.7* 2.4   Liver Function Tests: Recent Labs  Lab 09/03/22 0524 09/04/22 0730 09/05/22 0521 09/06/22 0509 09/07/22 0344  AST 158* 86* 65* 77* 76*  ALT 82* 73* 52* 50* 48*   ALKPHOS 184* 170* 135* 120 117  BILITOT 19.1* 18.9* 15.5* 14.8* 14.2*  PROT 6.4* 6.2* 5.6* 6.0* 6.0*  ALBUMIN 2.4* 2.2* 2.5* 3.3* 3.3*   Recent Labs  Lab 09/01/22 1845  LIPASE 76*   Recent Labs  Lab 09/01/22 1930  AMMONIA 29   CBC: Recent Labs  Lab 09/03/22 0524 09/04/22 0730 09/05/22 0521 09/06/22 0509 09/07/22 0344  WBC 16.5* 14.4* 10.7* 11.7* 12.9*  NEUTROABS 15.0* 12.4* 8.8* 9.8* 11.0*  HGB 14.1 13.1 10.5* 10.5* 10.3*  HCT 40.5 37.6 30.5* 30.6* 29.7*  MCV 103.6* 104.2* 104.5* 104.4* 104.9*  PLT 220 192 148* 148* 142*   Cardiac Enzymes: Recent Labs  Lab 09/01/22 2101  CKTOTAL 36*   BNP: Invalid input(s): "POCBNP" CBG: Recent Labs  Lab 09/02/22 1013 09/06/22 0821 09/07/22 0807  GLUCAP 101* 98 129*   D-Dimer No results for input(s): "DDIMER" in the last 72 hours. Hgb A1c No results for input(s): "HGBA1C" in the last 72 hours. Lipid Profile No results for input(s): "CHOL", "HDL", "LDLCALC", "TRIG", "CHOLHDL", "LDLDIRECT" in the last 72 hours. Thyroid function studies No results for input(s): "TSH", "T4TOTAL", "T3FREE", "THYROIDAB" in the last 72 hours.  Invalid input(s): "FREET3" Anemia work up No results for input(s): "VITAMINB12", "FOLATE", "FERRITIN", "TIBC", "IRON", "RETICCTPCT" in the last 72 hours. Urinalysis    Component Value Date/Time   COLORURINE AMBER (A) 09/01/2022 1845   APPEARANCEUR CLEAR 09/01/2022 1845   LABSPEC 1.010 09/01/2022 1845   PHURINE 6.0 09/01/2022  [place] 09/01/2022 1845   HGBUR MODERATE (A) 09/01/2022 1845   BILIRUBINUR SMALL (A) 09/01/2022 1845   BILIRUBINUR n 08/19/2016 [place] 09/01/2022 1845   PROTEINUR NEGATIVE 09/01/2022 1845   UROBILINOGEN 0.2 07/15/2020 1618   NITRITE NEGATIVE 09/01/2022 1845   LEUKOCYTESUR NEGATIVE 09/01/2022 1845   Sepsis Labs Recent Labs  Lab 09/04/22 0730 09/05/22 0521 09/06/22 0509 09/07/22 0344  WBC 14.4* 10.7* 11.7* 12.9*    Microbiology Recent Results (from the past 240 hour(s))  Urine Culture     Status: Abnormal   Collection Time: 09/01/22  6:45 PM   Specimen: Urine, Clean Catch  Result Value Ref Range Status   Specimen Description   Final    URINE, CLEAN CATCH Performed at [facility], [place] [address], [place], [place] [address]    Special Requests   Final    NONE Performed at [facility], [place] [address], [place], [place] [address]    Culture 20,000 COLONIES/mL LACTOBACILLUS SPECIES (A)  Final   Report Status 09/03/2022 FINAL  Final  Blood culture (routine x 2)     Status: None   Collection Time: 09/01/22  8:30 PM   Specimen: BLOOD  Result Value Ref Range Status   Specimen Description   Final    BLOOD LEFT ANTECUBITAL Performed at [place] [address] [place], [place] [address]    Special Requests   Final    BOTTLES DRAWN AEROBIC AND ANAEROBIC Blood Culture adequate volume Performed at [facility] [address] [place], [place] [address]    Culture   Final    NO GROWTH 5 DAYS Performed at [facility], [place] [address], [place], [place] [address]    Report Status 09/06/2022 FINAL  Final  Blood culture (routine x 2)     Status: None   Collection Time: 09/01/22  8:55 PM   Specimen: BLOOD  Result Value Ref Range Status   Specimen Description   Final    BLOOD RIGHT ANTECUBITAL Performed at [facility] [address], [place], [place] [address]    Special Requests   Final    BOTTLES DRAWN AEROBIC AND ANAEROBIC Blood Culture adequate volume Performed at [facility] [address] [place], [place] [address]    Culture   Final    NO GROWTH 5 DAYS Performed at [facility], [place] [address], [place],  [address]    Report Status 09/06/2022 FINAL  Final    Procedures/Studies: [place] ASCITES (ABDOMEN LIMITED)  Result Date: 09/02/2022 CLINICAL  DATA:  Ascites EXAM: LIMITED ABDOMEN ULTRASOUND FOR ASCITES TECHNIQUE: Limited ultrasound survey for ascites was performed in all four abdominal quadrants. COMPARISON:  None Available. FINDINGS: Small pocket of fluid is seen in the left lower quadrant. IMPRESSION: Trace ascites in the left lower quadrant Electronically Signed   By: Marin Roberts M.D.   On: 09/02/2022 14:16   ECHOCARDIOGRAM COMPLETE  Result Date: 09/02/2022    ECHOCARDIOGRAM REPORT   Patient Name:   BERDIE MALTER Date of Exam: 09/02/2022 Medical Rec #:  [id number]         Height:       61.5 in Accession #:    [id number]        Weight:       194.0 lb Date of Birth:  1971-04-09         BSA:  1.876 m Patient Age:    18 years          BP:           119/66 mmHg Patient Gender: F                 HR:           100 bpm. Exam Location:  Inpatient Procedure: 2D Echo Indications:    fluid overload  History:        Patient has prior history of Echocardiogram examinations, most                 recent 09/27/2013. Risk Factors:Dyslipidemia.  Sonographer:    Harvie Junior Referring Phys: [id number] SARA-MAIZ A THOMAS  Sonographer Comments: Image acquisition challenging due to patient body habitus. IMPRESSIONS  1. Left ventricular ejection fraction, by estimation, is 70 to 75%. The left ventricle has hyperdynamic function. The left ventricle has no regional wall motion abnormalities. Left ventricular diastolic parameters were normal.  2. Right ventricular systolic function is hyperdynamic. The right ventricular size is normal. Tricuspid regurgitation signal is inadequate for assessing PA pressure.  3. The mitral valve was not well visualized. No evidence of mitral valve regurgitation. No evidence of mitral stenosis.  4. The aortic valve is tricuspid. Aortic valve regurgitation is not visualized. No aortic stenosis is present. Comparison(s): Unable to view prior study. FINDINGS  Left Ventricle: Left ventricular ejection fraction, by estimation, is 70 to  75%. The left ventricle has hyperdynamic function. The left ventricle has no regional wall motion abnormalities. The left ventricular internal cavity size was normal in size. There is no left ventricular hypertrophy. Left ventricular diastolic parameters were normal. Right Ventricle: The right ventricular size is normal. No increase in right ventricular wall thickness. Right ventricular systolic function is hyperdynamic. Tricuspid regurgitation signal is inadequate for assessing PA pressure. Left Atrium: Left atrial size was normal in size. Right Atrium: Right atrial size was normal in size. Pericardium: There is no evidence of pericardial effusion. Presence of epicardial fat layer. Mitral Valve: The mitral valve was not well visualized. No evidence of mitral valve regurgitation. No evidence of mitral valve stenosis. Tricuspid Valve: The tricuspid valve is not well visualized. Tricuspid valve regurgitation is not demonstrated. No evidence of tricuspid stenosis. Aortic Valve: The aortic valve is tricuspid. Aortic valve regurgitation is not visualized. No aortic stenosis is present. Aortic valve mean gradient measures 6.0 mmHg. Aortic valve peak gradient measures 8.9 mmHg. Aortic valve area, by VTI measures 2.25 cm. Pulmonic Valve: The pulmonic valve was normal in structure. Pulmonic valve regurgitation is not visualized. No evidence of pulmonic stenosis. Aorta: The aortic root and ascending aorta are structurally normal, with no evidence of dilitation. IAS/Shunts: No atrial level shunt detected by color flow Doppler.  LEFT VENTRICLE PLAX 2D LVIDd:         4.60 cm      Diastology LVIDs:         3.20 cm      LV e' medial:    9.36 cm/s LV PW:         0.80 cm      LV E/e' medial:  11.2 LV IVS:        0.80 cm      LV e' lateral:   10.10 cm/s LVOT diam:     1.70 cm      LV E/e' lateral: 10.4 LV SV:         66 LV SV Index:  35 LVOT Area:     2.27 cm  LV Volumes (MOD) LV vol d, MOD A2C: 71.5 ml LV vol d, MOD A4C: 111.0 ml  LV vol s, MOD A2C: 32.0 ml LV vol s, MOD A4C: 48.2 ml LV SV MOD A2C:     39.5 ml LV SV MOD A4C:     111.0 ml LV SV MOD BP:      51.8 ml RIGHT VENTRICLE RV Basal diam:  3.10 cm RV Mid diam:    2.50 cm RV S prime:     18.50 cm/s TAPSE (M-mode): 2.4 cm LEFT ATRIUM             Index        RIGHT ATRIUM           Index LA diam:        3.30 cm 1.76 cm/m   RA Area:     10.20 cm LA Vol (A2C):   33.9 ml 18.07 ml/m  RA Volume:   19.40 ml  10.34 ml/m LA Vol (A4C):   66.7 ml 35.56 ml/m LA Biplane Vol: 51.1 ml 27.24 ml/m  AORTIC VALVE                     PULMONIC VALVE AV Area (Vmax):    2.25 cm      PV Vmax:       1.07 m/s AV Area (Vmean):   2.25 cm      PV Peak grad:  4.6 mmHg AV Area (VTI):     2.25 cm AV Vmax:           149.00 cm/s AV Vmean:          113.000 cm/s AV VTI:            0.292 m AV Peak Grad:      8.9 mmHg AV Mean Grad:      6.0 mmHg LVOT Vmax:         148.00 cm/s LVOT Vmean:        112.000 cm/s LVOT VTI:          0.289 m LVOT/AV VTI ratio: 0.99  AORTA Ao Root diam: 3.00 cm Ao Asc diam:  2.70 cm MITRAL VALVE MV Area (PHT): 3.31 cm     SHUNTS MV Decel Time: 229 msec     Systemic VTI:  0.29 m MR Peak grad: 21.3 mmHg     Systemic Diam: 1.70 cm MR Vmax:      231.00 cm/s MV E velocity: 105.00 cm/s MV A velocity: 115.00 cm/s MV E/A ratio:  0.91 Rudean Haskell MD Electronically signed by Rudean Haskell MD Signature Date/Time: 09/02/2022/10:50:04 AM    Final    MR ABDOMEN MRCP W WO CONTAST  Result Date: 09/02/2022 CLINICAL DATA:  Jaundice history of alcohol associated hepatitis. EXAM: MRI ABDOMEN WITHOUT AND WITH CONTRAST (INCLUDING MRCP) TECHNIQUE: Multiplanar multisequence MR imaging of the abdomen was performed both before and after the administration of intravenous contrast. Heavily T2-weighted images of the biliary and pancreatic ducts were obtained, and three-dimensional MRCP images were rendered by post processing. CONTRAST:  63m GADAVIST GADOBUTROL 1 MMOL/ML IV SOLN COMPARISON:  Multiple  priors including CT abdomen and pelvis September 01, 2022. FINDINGS: Lower chest: No acute abnormality. Hepatobiliary: No significant hepatic steatosis. Cirrhotic hepatic morphology. No suspicious hepatic lesion. Gallbladder is decompressed. Gallbladder wall thickening with pericholecystic fluid. Pancreas: No pancreatic ductal dilation. Edematous appearance of the pancreas with peripancreatic fluid. Homogeneous postcontrast enhancement  of the pancreas. Spleen: Mild splenomegaly measuring 14.1 cm in maximum axial dimension. Adrenals/Urinary Tract: Bilateral adrenal glands appear normal. No hydronephrosis. No solid enhancing renal mass. Stomach/Bowel: Mild wall thickening of the proximal duodenum as well as the ascending colon through the hepatic flexure. Vascular/Lymphatic: Normal caliber abdominal aorta. Smooth IVC contours. The portal, splenic and superior mesenteric veins are patent. Abdominal portosystemic collateral vessels including esophageal varices, gastric varices and recannulized paraumbilical vein. Prominent upper abdominal lymph nodes for instance a periportal lymph node measuring 9 mm. Other:  Trace abdominal free fluid. Musculoskeletal: No suspicious bone lesions identified. IMPRESSION: 1. Edematous appearance of the pancreas with peripancreatic fluid. Homogeneous postcontrast enhancement of the pancreas. Findings are favored to reflect acute interstitial pancreatitis. Recommend correlation with lipase. 2. Cirrhotic hepatic morphology with evidence of portal venous hypertension including portosystemic collateral vessels, trace ascites, mild splenomegaly and portal enterocolopathy. 3. No suspicious hepatic lesion identified. Electronically Signed   By: Dahlia Bailiff M.D.   On: 09/02/2022 08:50   MR 3D Recon At Scanner  Result Date: 09/02/2022 CLINICAL DATA:  Jaundice history of alcohol associated hepatitis. EXAM: MRI ABDOMEN WITHOUT AND WITH CONTRAST (INCLUDING MRCP) TECHNIQUE: Multiplanar  multisequence MR imaging of the abdomen was performed both before and after the administration of intravenous contrast. Heavily T2-weighted images of the biliary and pancreatic ducts were obtained, and three-dimensional MRCP images were rendered by post processing. CONTRAST:  66m GADAVIST GADOBUTROL 1 MMOL/ML IV SOLN COMPARISON:  Multiple priors including CT abdomen and pelvis September 01, 2022. FINDINGS: Lower chest: No acute abnormality. Hepatobiliary: No significant hepatic steatosis. Cirrhotic hepatic morphology. No suspicious hepatic lesion. Gallbladder is decompressed. Gallbladder wall thickening with pericholecystic fluid. Pancreas: No pancreatic ductal dilation. Edematous appearance of the pancreas with peripancreatic fluid. Homogeneous postcontrast enhancement of the pancreas. Spleen: Mild splenomegaly measuring 14.1 cm in maximum axial dimension. Adrenals/Urinary Tract: Bilateral adrenal glands appear normal. No hydronephrosis. No solid enhancing renal mass. Stomach/Bowel: Mild wall thickening of the proximal duodenum as well as the ascending colon through the hepatic flexure. Vascular/Lymphatic: Normal caliber abdominal aorta. Smooth IVC contours. The portal, splenic and superior mesenteric veins are patent. Abdominal portosystemic collateral vessels including esophageal varices, gastric varices and recannulized paraumbilical vein. Prominent upper abdominal lymph nodes for instance a periportal lymph node measuring 9 mm. Other:  Trace abdominal free fluid. Musculoskeletal: No suspicious bone lesions identified. IMPRESSION: 1. Edematous appearance of the pancreas with peripancreatic fluid. Homogeneous postcontrast enhancement of the pancreas. Findings are favored to reflect acute interstitial pancreatitis. Recommend correlation with lipase. 2. Cirrhotic hepatic morphology with evidence of portal venous hypertension including portosystemic collateral vessels, trace ascites, mild splenomegaly and portal  enterocolopathy. 3. No suspicious hepatic lesion identified. Electronically Signed   By: JDahlia BailiffM.D.   On: 09/02/2022 08:50   DG Chest Portable 1 View  Result Date: 09/01/2022 CLINICAL DATA:  Pulmonary edema and leg swelling. EXAM: PORTABLE CHEST 1 VIEW COMPARISON:  05/05/2022. FINDINGS: The heart size and mediastinal contours are within normal limits. No consolidation, effusion, or pneumothorax. No acute osseous abnormality. IMPRESSION: No active disease. Electronically Signed   By: LBrett FairyM.D.   On: 09/01/2022 21:09   CT ABDOMEN PELVIS W CONTRAST  Result Date: 09/01/2022 CLINICAL DATA:  Epigastric pain. Pt states she is in liver failure. States she had labs drawn this morning and was told to come to the hospital for admission. Pt reports nose bleeds, bleeding hemorrhoids, yellow skin. EXAM: CT ABDOMEN AND PELVIS WITH CONTRAST TECHNIQUE: Multidetector CT imaging of the  abdomen and pelvis was performed using the standard protocol following bolus administration of intravenous contrast. RADIATION DOSE REDUCTION: This exam was performed according to the departmental dose-optimization program which includes automated exposure control, adjustment of the mA and/or kV according to patient size and/or use of iterative reconstruction technique. CONTRAST:  172m OMNIPAQUE IOHEXOL 300 MG/ML  SOLN COMPARISON:  Ultrasound abdomen 09/17/2021 FINDINGS: Lower chest: No acute abnormality. Hepatobiliary: Nodular hepatic contour. Enlarged left hepatic lobe. The liver is enlarged measuring up to 20 cm. No focal liver abnormality. Contracted gallbladder. No gallstones, gallbladder wall thickening, or pericholecystic fluid. No biliary dilatation. Pancreas: No focal lesion. Normal pancreatic contour. No surrounding inflammatory changes. No main pancreatic ductal dilatation. Spleen: Normal in size without focal abnormality. Adrenals/Urinary Tract: No adrenal nodule bilaterally. Bilateral kidneys enhance  symmetrically. No hydronephrosis. No hydroureter. The urinary bladder is unremarkable. On delayed imaging, there is no urothelial wall thickening and there are no filling defects in the opacified portions of the bilateral collecting systems or ureters. Stomach/Bowel: Stomach is within normal limits. No evidence of bowel wall thickening or dilatation. Appendix appears normal. Vascular/Lymphatic: Recanalized paraumbilical vein. Paraesophageal, perigastric, and abdominopelvic venous collaterals are noted. The main portal, splenic, superior mesenteric veins are patent. No abdominal aorta or iliac aneurysm. Mild atherosclerotic plaque of the aorta and its branches. No abdominal, pelvic, or inguinal lymphadenopathy. Reproductive: Uterus and bilateral adnexa are unremarkable. Other: Trace to small volume simple free fluid within the upper abdomen and within the pelvis. No intraperitoneal free gas. No organized fluid collection. Musculoskeletal: Subcutaneus soft tissue edema. No suspicious lytic or blastic osseous lesions. No acute displaced fracture. Multilevel degenerative changes of the spine. IMPRESSION: 1. Cirrhosis with portal hypertension. No focal liver lesions identified. Please note that liver protocol enhanced MR and CT are the most sensitive tests for the screening detection of hepatocellular carcinoma in the high risk setting of cirrhosis. 2. Trace to small volume simple free fluid ascites. 3.  Aortic Atherosclerosis (ICD10-I70.0). Electronically Signed   By: MIven FinnM.D.   On: 09/01/2022 20:10   UKoreaAbdomen Complete  Result Date: 08/25/2022 CLINICAL DATA:  Epigastric pain.  Alcoholic cirrhosis. EXAM: ABDOMEN ULTRASOUND COMPLETE COMPARISON:  Ultrasound abdomen 09/17/2021 FINDINGS: Gallbladder: Gallbladder is contracted with mild wall thickening measuring up to 6 mm. No cholelithiasis. Negative sonographic Murphy's sign. No pericholecystic fluid. Common bile duct: Diameter: 3 mm Liver: Coarsened  echogenicity and nodular contour. No focal lesion. Mixed flow is demonstrated within the portal vein, which is poorly evaluated. IVC: No abnormality visualized. Pancreas: Not well visualized due to overlying bowel gas. Spleen: Size and appearance within normal limits. Right Kidney: Length: 14 cm. Echogenicity within normal limits. No mass or hydronephrosis visualized. Left Kidney: Length: 10 cm. Echogenicity within normal limits. No mass or hydronephrosis visualized. Abdominal aorta: No aneurysm visualized. Other findings: None. IMPRESSION: 1. Cirrhotic morphology of the liver. No focal hepatic lesion. 2. Gallbladder is contracted with mild wall thickening, nonspecific, potentially secondary to cirrhosis. Consider clinical and laboratory correlation. No cholelithiasis. 3. Mixed directional flow demonstrated within the portal vein, which is poorly evaluated. Electronically Signed   By: DLovey NewcomerM.D.   On: 08/25/2022 12:34     Time coordinating discharge: Over 30 minutes    DDwyane Dee MD  [facility] 09/07/2022, 5:16 PM

## 2022-09-07 NOTE — Telephone Encounter (Signed)
Referral form completed and records printed and faxed to [facility] at [phone]

## 2022-09-07 NOTE — Progress Notes (Signed)
Discharge instructions given to patient and husband. They verbalized understanding via teach back method.

## 2022-09-08 ENCOUNTER — Other Ambulatory Visit: Payer: Self-pay

## 2022-09-08 ENCOUNTER — Other Ambulatory Visit (HOSPITAL_COMMUNITY): Payer: Self-pay

## 2022-09-08 DIAGNOSIS — K7010 Alcoholic hepatitis without ascites: Secondary | ICD-10-CM

## 2022-09-08 NOTE — Telephone Encounter (Signed)
10/13/22 at 930 am appt with GM Lab order entered  Letter mailed to the pt with appt info.

## 2022-09-09 ENCOUNTER — Telehealth: Payer: Self-pay

## 2022-09-09 ENCOUNTER — Other Ambulatory Visit (INDEPENDENT_AMBULATORY_CARE_PROVIDER_SITE_OTHER): Payer: 59

## 2022-09-09 ENCOUNTER — Other Ambulatory Visit (HOSPITAL_COMMUNITY): Payer: Self-pay

## 2022-09-09 DIAGNOSIS — K7010 Alcoholic hepatitis without ascites: Secondary | ICD-10-CM

## 2022-09-09 DIAGNOSIS — R1013 Epigastric pain: Secondary | ICD-10-CM

## 2022-09-09 DIAGNOSIS — E876 Hypokalemia: Secondary | ICD-10-CM

## 2022-09-09 LAB — HEPATIC FUNCTION PANEL
ALT: 65 U/L — ABNORMAL HIGH (ref 0–35)
AST: 85 U/L — ABNORMAL HIGH (ref 0–37)
Albumin: 4 g/dL (ref 3.5–5.2)
Alkaline Phosphatase: 178 U/L — ABNORMAL HIGH (ref 39–117)
Bilirubin, Direct: 13.7 mg/dL — ABNORMAL HIGH (ref 0.0–0.3)
Total Bilirubin: 18.4 mg/dL — ABNORMAL HIGH (ref 0.2–1.2)
Total Protein: 6.7 g/dL (ref 6.0–8.3)

## 2022-09-09 LAB — COMPREHENSIVE METABOLIC PANEL
ALT: 65 U/L — ABNORMAL HIGH (ref 0–35)
AST: 85 U/L — ABNORMAL HIGH (ref 0–37)
Albumin: 4 g/dL (ref 3.5–5.2)
Alkaline Phosphatase: 178 U/L — ABNORMAL HIGH (ref 39–117)
BUN: 18 mg/dL (ref 6–23)
CO2: 29 mEq/L (ref 19–32)
Calcium: 9.2 mg/dL (ref 8.4–10.5)
Chloride: 95 mEq/L — ABNORMAL LOW (ref 96–112)
Creatinine, Ser: 0.67 mg/dL (ref 0.40–1.20)
GFR: 100.23 mL/min (ref >60.00)
Glucose, Bld: 127 mg/dL — ABNORMAL HIGH (ref 70–99)
Potassium: 4 mEq/L (ref 3.5–5.1)
Sodium: 134 mEq/L — ABNORMAL LOW (ref 135–145)
Total Bilirubin: 18.4 mg/dL — ABNORMAL HIGH (ref 0.2–1.2)
Total Protein: 6.7 g/dL (ref 6.0–8.3)

## 2022-09-09 LAB — PROTIME-INR
INR: 2.3 ratio — ABNORMAL HIGH (ref 0.8–1.0)
Prothrombin Time: 23.8 s — ABNORMAL HIGH (ref 9.6–13.1)

## 2022-09-09 LAB — CBC WITH DIFFERENTIAL/PLATELET
Basophils Absolute: 0.4 10*3/uL — ABNORMAL HIGH (ref 0.0–0.1)
Basophils Relative: 1.9 % (ref 0.0–3.0)
Eosinophils Absolute: 0 10*3/uL (ref 0.0–0.7)
Eosinophils Relative: 0.2 % (ref 0.0–5.0)
HCT: 38.2 % (ref 36.0–46.0)
Hemoglobin: 13.3 g/dL (ref 12.0–15.0)
Lymphocytes Relative: 5.9 % — ABNORMAL LOW (ref 12.0–46.0)
Lymphs Abs: 1.1 10*3/uL (ref 0.7–4.0)
MCHC: 34.9 g/dL (ref 30.0–36.0)
MCV: 105.6 fl — ABNORMAL HIGH (ref 78.0–100.0)
Monocytes Absolute: 0.7 10*3/uL (ref 0.1–1.0)
Monocytes Relative: 3.5 % (ref 3.0–12.0)
Neutro Abs: 16.9 10*3/uL — ABNORMAL HIGH (ref 1.4–7.7)
Neutrophils Relative %: 88.5 % — ABNORMAL HIGH (ref 43.0–77.0)
Platelets: 216 10*3/uL (ref 150.0–400.0)
RBC: 3.62 Mil/uL — ABNORMAL LOW (ref 3.87–5.11)
RDW: 16.3 % — ABNORMAL HIGH (ref 11.5–15.5)
WBC: 19.1 10*3/uL (ref 4.0–10.5)

## 2022-09-09 MED ORDER — PHYTONADIONE 5 MG PO TABS
ORAL_TABLET | ORAL | 0 refills | Status: DC
  Filled 2022-09-09: qty 14, 14d supply, fill #0

## 2022-09-09 NOTE — Telephone Encounter (Signed)
Called and informed patient of labs results. Prescription for Vitamin K has been sent to pharmacy. Pt will have repeat labs next week. Pt states that she is feeling ok, just has some fatigue. Pt has not heard from [facility].

## 2022-09-09 NOTE — Telephone Encounter (Signed)
Put on list to follow up by Friday/Monday.  I have reached out to my contact at [facility] as well so they should be on the lookout. GM

## 2022-09-09 NOTE — Telephone Encounter (Signed)
I saw the results and put a result note in the chart and sent it to [facility].  Rovonda, if you want to help Patty with the follow-up I am okay with that too. Thanks. GM

## 2022-09-09 NOTE — Telephone Encounter (Signed)
Recd call from lab with critical lab results of 19.1 WBC.

## 2022-09-09 NOTE — Telephone Encounter (Signed)
Noted  

## 2022-09-11 DIAGNOSIS — F1011 Alcohol abuse, in remission: Secondary | ICD-10-CM | POA: Diagnosis not present

## 2022-09-11 HISTORY — PX: LIVER TRANSPLANT: SHX410

## 2022-09-11 NOTE — Telephone Encounter (Signed)
Pt was contacted for symptom update:  Pt stated that her fever is gone and she is feeling much better:

## 2022-09-16 ENCOUNTER — Other Ambulatory Visit: Payer: Self-pay

## 2022-09-16 ENCOUNTER — Other Ambulatory Visit (INDEPENDENT_AMBULATORY_CARE_PROVIDER_SITE_OTHER): Payer: 59

## 2022-09-16 ENCOUNTER — Encounter: Payer: Self-pay | Admitting: Gastroenterology

## 2022-09-16 DIAGNOSIS — K7010 Alcoholic hepatitis without ascites: Secondary | ICD-10-CM | POA: Diagnosis not present

## 2022-09-16 DIAGNOSIS — R1013 Epigastric pain: Secondary | ICD-10-CM

## 2022-09-16 DIAGNOSIS — E876 Hypokalemia: Secondary | ICD-10-CM | POA: Diagnosis not present

## 2022-09-16 LAB — COMPREHENSIVE METABOLIC PANEL
ALT: 90 U/L — ABNORMAL HIGH (ref 0–35)
AST: 130 U/L — ABNORMAL HIGH (ref 0–37)
Albumin: 3.4 g/dL — ABNORMAL LOW (ref 3.5–5.2)
Alkaline Phosphatase: 227 U/L — ABNORMAL HIGH (ref 39–117)
BUN: 24 mg/dL — ABNORMAL HIGH (ref 6–23)
CO2: 27 mEq/L (ref 19–32)
Calcium: 8.8 mg/dL (ref 8.4–10.5)
Chloride: 89 mEq/L — ABNORMAL LOW (ref 96–112)
Creatinine, Ser: 0.95 mg/dL (ref 0.40–1.20)
GFR: 68.74 mL/min (ref >60.00)
Glucose, Bld: 150 mg/dL — ABNORMAL HIGH (ref 70–99)
Potassium: 4.2 mEq/L (ref 3.5–5.1)
Sodium: 126 mEq/L — ABNORMAL LOW (ref 135–145)
Total Bilirubin: 21 mg/dL — ABNORMAL HIGH (ref 0.2–1.2)
Total Protein: 6.1 g/dL (ref 6.0–8.3)

## 2022-09-16 LAB — CBC
HCT: 37.5 % (ref 36.0–46.0)
Hemoglobin: 13 g/dL (ref 12.0–15.0)
MCHC: 34.7 g/dL (ref 30.0–36.0)
MCV: 105.4 fl — ABNORMAL HIGH (ref 78.0–100.0)
Platelets: 207 10*3/uL (ref 150.0–400.0)
RBC: 3.55 Mil/uL — ABNORMAL LOW (ref 3.87–5.11)
RDW: 15.9 % — ABNORMAL HIGH (ref 11.5–15.5)
WBC: 17.2 10*3/uL — ABNORMAL HIGH (ref 4.0–10.5)

## 2022-09-16 LAB — PROTIME-INR
INR: 2.6 ratio — ABNORMAL HIGH (ref 0.8–1.0)
Prothrombin Time: 26.9 s — ABNORMAL HIGH (ref 9.6–13.1)

## 2022-09-16 NOTE — Telephone Encounter (Signed)
Dr Sherron Flemings can not see the pt until Feb.  The referral was made as urgent and the scheduler did ask that the pt call to make her appt today because it would be a few more days if she waited on them to call.  (See alternate note).  Can she wait or is there someone you know that can get in her in sooner?

## 2022-09-17 ENCOUNTER — Other Ambulatory Visit (HOSPITAL_COMMUNITY): Payer: Self-pay

## 2022-09-17 NOTE — Telephone Encounter (Signed)
I have left a message with my contact and I know they are busy as can be also. She can be seen by any of the Liver Transplant Hepatologists at [facility]. Dr. Merrilee Jansky, is whom I had spoken with during her inpatient hospitalization, and remains my preference overall, but I trust their group completely, so any of them will take great care to understand if she can be a candidate for transplant evaluation. Please make sure she calls as well. Thanks. GM

## 2022-09-18 ENCOUNTER — Encounter: Payer: Self-pay | Admitting: Gastroenterology

## 2022-09-18 ENCOUNTER — Telehealth: Payer: Self-pay

## 2022-09-18 ENCOUNTER — Other Ambulatory Visit (INDEPENDENT_AMBULATORY_CARE_PROVIDER_SITE_OTHER): Payer: 59

## 2022-09-18 ENCOUNTER — Other Ambulatory Visit (HOSPITAL_COMMUNITY): Payer: Self-pay

## 2022-09-18 DIAGNOSIS — K7010 Alcoholic hepatitis without ascites: Secondary | ICD-10-CM

## 2022-09-18 LAB — CBC WITH DIFFERENTIAL/PLATELET
Basophils Absolute: 0.2 10*3/uL — ABNORMAL HIGH (ref 0.0–0.1)
Basophils Relative: 1 % (ref 0.0–3.0)
Eosinophils Absolute: 0.1 10*3/uL (ref 0.0–0.7)
Eosinophils Relative: 0.5 % (ref 0.0–5.0)
HCT: 37.6 % (ref 36.0–46.0)
Hemoglobin: 13 g/dL (ref 12.0–15.0)
Lymphocytes Relative: 13.8 % (ref 12.0–46.0)
Lymphs Abs: 2.6 10*3/uL (ref 0.7–4.0)
MCHC: 34.6 g/dL (ref 30.0–36.0)
MCV: 104 fl — ABNORMAL HIGH (ref 78.0–100.0)
Monocytes Absolute: 1 10*3/uL (ref 0.1–1.0)
Monocytes Relative: 5.5 % (ref 3.0–12.0)
Neutro Abs: 14.8 10*3/uL — ABNORMAL HIGH (ref 1.4–7.7)
Neutrophils Relative %: 79.2 % — ABNORMAL HIGH (ref 43.0–77.0)
Platelets: 206 10*3/uL (ref 150.0–400.0)
RBC: 3.62 Mil/uL — ABNORMAL LOW (ref 3.87–5.11)
RDW: 15.7 % — ABNORMAL HIGH (ref 11.5–15.5)
WBC: 18.7 10*3/uL (ref 4.0–10.5)

## 2022-09-18 LAB — COMPREHENSIVE METABOLIC PANEL
ALT: 87 U/L — ABNORMAL HIGH (ref 0–35)
AST: 161 U/L — ABNORMAL HIGH (ref 0–37)
Albumin: 3.3 g/dL — ABNORMAL LOW (ref 3.5–5.2)
Alkaline Phosphatase: 248 U/L — ABNORMAL HIGH (ref 39–117)
BUN: 27 mg/dL — ABNORMAL HIGH (ref 6–23)
CO2: 23 mEq/L (ref 19–32)
Calcium: 9.2 mg/dL (ref 8.4–10.5)
Chloride: 91 mEq/L — ABNORMAL LOW (ref 96–112)
Creatinine, Ser: 0.92 mg/dL (ref 0.40–1.20)
GFR: 71.44 mL/min (ref >60.00)
Glucose, Bld: 185 mg/dL — ABNORMAL HIGH (ref 70–99)
Potassium: 4.5 mEq/L (ref 3.5–5.1)
Sodium: 127 mEq/L — ABNORMAL LOW (ref 135–145)
Total Bilirubin: 23.6 mg/dL — ABNORMAL HIGH (ref 0.2–1.2)
Total Protein: 6.3 g/dL (ref 6.0–8.3)

## 2022-09-18 LAB — PROTIME-INR
INR: 2.7 ratio — ABNORMAL HIGH (ref 0.8–1.0)
Prothrombin Time: 27.5 s — ABNORMAL HIGH (ref 9.6–13.1)

## 2022-09-18 NOTE — Telephone Encounter (Signed)
Patty,  No issue with this laboratory, as she has just finished a month long course of steroids. I would recommend, we send an expedited workup referral to [facility]ology on her behalf, while she is still awaiting a visit to [facility] hepat[facility]. I have reviewed her Na level, and make no changes to my last result notation of her medications. Thanks. GM

## 2022-09-18 NOTE — Telephone Encounter (Signed)
Jamie Coleman with the lab called stating critical lab White count is 18.7. Please advise

## 2022-09-18 NOTE — Telephone Encounter (Signed)
Left message

## 2022-09-18 NOTE — Telephone Encounter (Signed)
FYI see critical lab

## 2022-09-19 ENCOUNTER — Other Ambulatory Visit (HOSPITAL_COMMUNITY): Payer: Self-pay

## 2022-09-19 ENCOUNTER — Telehealth: Payer: Self-pay | Admitting: Gastroenterology

## 2022-09-19 MED ORDER — LACTULOSE 10 GM/15ML PO SOLN
20.0000 g | Freq: Every day | ORAL | 1 refills | Status: DC
  Filled 2022-09-19: qty 150, 5d supply, fill #0
  Filled 2022-09-19: qty 1650, 55d supply, fill #0

## 2022-09-19 NOTE — Telephone Encounter (Signed)
Patient called.  Needing refills on Lactulose.  Prescription sent to [facility] outpatient pharmacy.

## 2022-09-20 NOTE — Telephone Encounter (Signed)
JZ, Thanks for doing this. GM

## 2022-09-21 ENCOUNTER — Inpatient Hospital Stay (HOSPITAL_COMMUNITY)
Admission: EM | Admit: 2022-09-21 | Discharge: 2022-09-22 | DRG: 442 | Disposition: A | Discharge status: Home / Self Care | Payer: 59 | Attending: Internal Medicine | Admitting: Internal Medicine

## 2022-09-21 ENCOUNTER — Telehealth: Payer: Self-pay | Admitting: Gastroenterology

## 2022-09-21 ENCOUNTER — Emergency Department (HOSPITAL_COMMUNITY): Payer: 59

## 2022-09-21 ENCOUNTER — Encounter (HOSPITAL_COMMUNITY): Payer: Self-pay

## 2022-09-21 ENCOUNTER — Other Ambulatory Visit: Payer: Self-pay

## 2022-09-21 ENCOUNTER — Other Ambulatory Visit (HOSPITAL_COMMUNITY): Payer: Self-pay

## 2022-09-21 DIAGNOSIS — Z8719 Personal history of other diseases of the digestive system: Secondary | ICD-10-CM | POA: Diagnosis not present

## 2022-09-21 DIAGNOSIS — Z79899 Other long term (current) drug therapy: Secondary | ICD-10-CM | POA: Diagnosis not present

## 2022-09-21 DIAGNOSIS — K709 Alcoholic liver disease, unspecified: Secondary | ICD-10-CM | POA: Diagnosis not present

## 2022-09-21 DIAGNOSIS — T380X5A Adverse effect of glucocorticoids and synthetic analogues, initial encounter: Secondary | ICD-10-CM | POA: Diagnosis present

## 2022-09-21 DIAGNOSIS — K7040 Alcoholic hepatic failure without coma: Secondary | ICD-10-CM | POA: Diagnosis present

## 2022-09-21 DIAGNOSIS — I81 Portal vein thrombosis: Secondary | ICD-10-CM | POA: Diagnosis not present

## 2022-09-21 DIAGNOSIS — D7589 Other specified diseases of blood and blood-forming organs: Secondary | ICD-10-CM | POA: Diagnosis present

## 2022-09-21 DIAGNOSIS — K7290 Hepatic failure, unspecified without coma: Secondary | ICD-10-CM

## 2022-09-21 DIAGNOSIS — R5383 Other fatigue: Secondary | ICD-10-CM | POA: Diagnosis not present

## 2022-09-21 DIAGNOSIS — D72823 Leukemoid reaction: Secondary | ICD-10-CM | POA: Diagnosis present

## 2022-09-21 DIAGNOSIS — E871 Hypo-osmolality and hyponatremia: Secondary | ICD-10-CM

## 2022-09-21 DIAGNOSIS — K7200 Acute and subacute hepatic failure without coma: Secondary | ICD-10-CM | POA: Diagnosis present

## 2022-09-21 DIAGNOSIS — K7460 Unspecified cirrhosis of liver: Secondary | ICD-10-CM | POA: Diagnosis not present

## 2022-09-21 DIAGNOSIS — Z833 Family history of diabetes mellitus: Secondary | ICD-10-CM

## 2022-09-21 DIAGNOSIS — Z83719 Family history of colon polyps, unspecified: Secondary | ICD-10-CM

## 2022-09-21 DIAGNOSIS — E669 Obesity, unspecified: Secondary | ICD-10-CM | POA: Diagnosis not present

## 2022-09-21 DIAGNOSIS — R17 Unspecified jaundice: Secondary | ICD-10-CM

## 2022-09-21 DIAGNOSIS — K7682 Hepatic encephalopathy: Secondary | ICD-10-CM | POA: Diagnosis not present

## 2022-09-21 DIAGNOSIS — Z8249 Family history of ischemic heart disease and other diseases of the circulatory system: Secondary | ICD-10-CM | POA: Diagnosis not present

## 2022-09-21 DIAGNOSIS — R531 Weakness: Secondary | ICD-10-CM | POA: Diagnosis not present

## 2022-09-21 DIAGNOSIS — K7010 Alcoholic hepatitis without ascites: Secondary | ICD-10-CM | POA: Diagnosis present

## 2022-09-21 DIAGNOSIS — F1021 Alcohol dependence, in remission: Secondary | ICD-10-CM | POA: Diagnosis present

## 2022-09-21 DIAGNOSIS — K7030 Alcoholic cirrhosis of liver without ascites: Secondary | ICD-10-CM | POA: Diagnosis not present

## 2022-09-21 DIAGNOSIS — K219 Gastro-esophageal reflux disease without esophagitis: Secondary | ICD-10-CM | POA: Diagnosis present

## 2022-09-21 DIAGNOSIS — Z7952 Long term (current) use of systemic steroids: Secondary | ICD-10-CM

## 2022-09-21 DIAGNOSIS — F1011 Alcohol abuse, in remission: Secondary | ICD-10-CM | POA: Diagnosis not present

## 2022-09-21 DIAGNOSIS — Z888 Allergy status to other drugs, medicaments and biological substances status: Secondary | ICD-10-CM | POA: Diagnosis not present

## 2022-09-21 DIAGNOSIS — K635 Polyp of colon: Secondary | ICD-10-CM | POA: Diagnosis present

## 2022-09-21 DIAGNOSIS — F419 Anxiety disorder, unspecified: Secondary | ICD-10-CM | POA: Diagnosis not present

## 2022-09-21 DIAGNOSIS — J9811 Atelectasis: Secondary | ICD-10-CM | POA: Diagnosis not present

## 2022-09-21 DIAGNOSIS — F1020 Alcohol dependence, uncomplicated: Secondary | ICD-10-CM | POA: Diagnosis present

## 2022-09-21 DIAGNOSIS — R5382 Chronic fatigue, unspecified: Secondary | ICD-10-CM

## 2022-09-21 DIAGNOSIS — K5790 Diverticulosis of intestine, part unspecified, without perforation or abscess without bleeding: Secondary | ICD-10-CM | POA: Diagnosis present

## 2022-09-21 LAB — CBC WITH DIFFERENTIAL/PLATELET
Abs Immature Granulocytes: 0.29 10*3/uL — ABNORMAL HIGH (ref 0.00–0.07)
Abs Immature Granulocytes: 0.26 10*3/uL — ABNORMAL HIGH (ref 0.00–0.07)
Basophils Absolute: 0.1 10*3/uL (ref 0.0–0.1)
Basophils Absolute: 0.1 10*3/uL (ref 0.0–0.1)
Basophils Relative: 0 %
Basophils Relative: 0 %
Eosinophils Absolute: 0.1 10*3/uL (ref 0.0–0.5)
Eosinophils Absolute: 0.1 10*3/uL (ref 0.0–0.5)
Eosinophils Relative: 1 %
Eosinophils Relative: 1 %
HCT: 36.8 % (ref 36.0–46.0)
HCT: 35.9 % — ABNORMAL LOW (ref 36.0–46.0)
Hemoglobin: 13.1 g/dL (ref 12.0–15.0)
Hemoglobin: 12.6 g/dL (ref 12.0–15.0)
Immature Granulocytes: 2 %
Immature Granulocytes: 1 %
Lymphocytes Relative: 5 %
Lymphocytes Relative: 7 %
Lymphs Abs: 1 10*3/uL (ref 0.7–4.0)
Lymphs Abs: 1.3 10*3/uL (ref 0.7–4.0)
MCH: 36 pg — ABNORMAL HIGH (ref 26.0–34.0)
MCH: 35.7 pg — ABNORMAL HIGH (ref 26.0–34.0)
MCHC: 35.6 g/dL (ref 30.0–36.0)
MCHC: 35.1 g/dL (ref 30.0–36.0)
MCV: 101.1 fL — ABNORMAL HIGH (ref 80.0–100.0)
MCV: 101.7 fL — ABNORMAL HIGH (ref 80.0–100.0)
Monocytes Absolute: 1.3 10*3/uL — ABNORMAL HIGH (ref 0.1–1.0)
Monocytes Absolute: 1.3 10*3/uL — ABNORMAL HIGH (ref 0.1–1.0)
Monocytes Relative: 7 %
Monocytes Relative: 7 %
Neutro Abs: 16.7 10*3/uL — ABNORMAL HIGH (ref 1.7–7.7)
Neutro Abs: 15.6 10*3/uL — ABNORMAL HIGH (ref 1.7–7.7)
Neutrophils Relative %: 85 %
Neutrophils Relative %: 84 %
Platelets: 174 10*3/uL (ref 150–400)
Platelets: 164 10*3/uL (ref 150–400)
RBC: 3.64 MIL/uL — ABNORMAL LOW (ref 3.87–5.11)
RBC: 3.53 MIL/uL — ABNORMAL LOW (ref 3.87–5.11)
RDW: 15.8 % — ABNORMAL HIGH (ref 11.5–15.5)
RDW: 15.7 % — ABNORMAL HIGH (ref 11.5–15.5)
WBC: 19.4 10*3/uL — ABNORMAL HIGH (ref 4.0–10.5)
WBC: 18.6 10*3/uL — ABNORMAL HIGH (ref 4.0–10.5)
nRBC: 0 % (ref 0.0–0.2)
nRBC: 0 % (ref 0.0–0.2)

## 2022-09-21 LAB — URINALYSIS, ROUTINE W REFLEX MICROSCOPIC
Glucose, UA: 50 mg/dL — AB
Hgb urine dipstick: NEGATIVE
Ketones, ur: NEGATIVE mg/dL
Leukocytes,Ua: NEGATIVE
Nitrite: NEGATIVE
Protein, ur: NEGATIVE mg/dL
Specific Gravity, Urine: 1.015 (ref 1.005–1.030)
pH: 5 (ref 5.0–8.0)

## 2022-09-21 LAB — PROTIME-INR
INR: 1.9 — ABNORMAL HIGH (ref 0.8–1.2)
Prothrombin Time: 21.5 seconds — ABNORMAL HIGH (ref 11.4–15.2)

## 2022-09-21 LAB — COMPREHENSIVE METABOLIC PANEL
ALT: 70 U/L — ABNORMAL HIGH (ref 0–44)
AST: 134 U/L — ABNORMAL HIGH (ref 15–41)
Albumin: 2.3 g/dL — ABNORMAL LOW (ref 3.5–5.0)
Alkaline Phosphatase: 240 U/L — ABNORMAL HIGH (ref 38–126)
Anion gap: 13 (ref 5–15)
BUN: 27 mg/dL — ABNORMAL HIGH (ref 6–20)
CO2: 19 mmol/L — ABNORMAL LOW (ref 22–32)
Calcium: 9.1 mg/dL (ref 8.9–10.3)
Chloride: 95 mmol/L — ABNORMAL LOW (ref 98–111)
Creatinine, Ser: 0.75 mg/dL (ref 0.44–1.00)
GFR, Estimated: >60 mL/min (ref >60)
Glucose, Bld: 120 mg/dL — ABNORMAL HIGH (ref 70–99)
Potassium: 4.9 mmol/L (ref 3.5–5.1)
Sodium: 127 mmol/L — ABNORMAL LOW (ref 135–145)
Total Bilirubin: 25.8 mg/dL (ref 0.3–1.2)
Total Protein: 6.4 g/dL — ABNORMAL LOW (ref 6.5–8.1)

## 2022-09-21 LAB — HIV ANTIBODY (ROUTINE TESTING W REFLEX): HIV Screen 4th Generation wRfx: NONREACTIVE

## 2022-09-21 LAB — AMMONIA: Ammonia: 42 umol/L — ABNORMAL HIGH (ref 9–35)

## 2022-09-21 LAB — BILIRUBIN, DIRECT: Bilirubin, Direct: 18.1 mg/dL — ABNORMAL HIGH (ref 0.0–0.2)

## 2022-09-21 LAB — LACTIC ACID, PLASMA: Lactic Acid, Venous: 1.2 mmol/L (ref 0.5–1.9)

## 2022-09-21 LAB — LIPASE, BLOOD: Lipase: 59 U/L — ABNORMAL HIGH (ref 11–51)

## 2022-09-21 MED ORDER — FOLIC ACID 1 MG PO TABS
1.0000 mg | ORAL_TABLET | Freq: Every day | ORAL | Status: DC
  Administered 2022-09-21: 1 mg via ORAL
  Filled 2022-09-21 (×2): qty 1

## 2022-09-21 MED ORDER — PANTOPRAZOLE SODIUM 40 MG PO TBEC
40.0000 mg | DELAYED_RELEASE_TABLET | Freq: Every day | ORAL | Status: DC
  Administered 2022-09-21 – 2022-09-22 (×2): 40 mg via ORAL
  Filled 2022-09-21 (×2): qty 1

## 2022-09-21 MED ORDER — ADULT MULTIVITAMIN W/MINERALS CH
1.0000 | ORAL_TABLET | Freq: Every day | ORAL | Status: DC
  Administered 2022-09-21 – 2022-09-22 (×2): 1 via ORAL
  Filled 2022-09-21 (×2): qty 1

## 2022-09-21 MED ORDER — SPIRONOLACTONE 25 MG PO TABS
50.0000 mg | ORAL_TABLET | Freq: Every day | ORAL | Status: DC
  Administered 2022-09-21 – 2022-09-22 (×2): 50 mg via ORAL
  Filled 2022-09-21 (×3): qty 2

## 2022-09-21 MED ORDER — LACTULOSE 10 GM/15ML PO SOLN
20.0000 g | Freq: Every day | ORAL | Status: DC
  Administered 2022-09-22: 20 g via ORAL
  Filled 2022-09-21: qty 30

## 2022-09-21 MED ORDER — THIAMINE MONONITRATE 100 MG PO TABS
500.0000 mg | ORAL_TABLET | Freq: Every day | ORAL | Status: DC
  Administered 2022-09-21 – 2022-09-22 (×2): 500 mg via ORAL
  Filled 2022-09-21 (×2): qty 5

## 2022-09-21 MED ORDER — PHYTONADIONE 5 MG PO TABS
5.0000 mg | ORAL_TABLET | Freq: Every day | ORAL | Status: DC
  Administered 2022-09-21 – 2022-09-22 (×2): 5 mg via ORAL
  Filled 2022-09-21 (×2): qty 1

## 2022-09-21 MED ORDER — VITAMIN C 500 MG PO TABS
1000.0000 mg | ORAL_TABLET | Freq: Every day | ORAL | Status: DC
  Administered 2022-09-21 – 2022-09-22 (×2): 1000 mg via ORAL
  Filled 2022-09-21 (×2): qty 2

## 2022-09-21 MED ORDER — ALPRAZOLAM 0.5 MG PO TABS
0.5000 mg | ORAL_TABLET | Freq: Every evening | ORAL | Status: DC | PRN
  Administered 2022-09-22: 0.5 mg via ORAL
  Filled 2022-09-21: qty 1

## 2022-09-21 MED ORDER — BRIMONIDINE TARTRATE 0.025 % OP SOLN: 1.0000 [drp] | OPHTHALMIC | Status: DC | PRN

## 2022-09-21 MED ORDER — MULTIVITAMINS PO CAPS: 1.0000 | ORAL_CAPSULE | Freq: Every day | ORAL | Status: DC

## 2022-09-21 MED ORDER — POLYVINYL ALCOHOL 1.4 % OP SOLN: 1.0000 [drp] | Freq: Four times a day (QID) | OPHTHALMIC | Status: DC | PRN

## 2022-09-21 MED ORDER — BUMETANIDE 0.5 MG PO TABS
0.5000 mg | ORAL_TABLET | Freq: Every day | ORAL | Status: DC
  Administered 2022-09-21 – 2022-09-22 (×2): 0.5 mg via ORAL
  Filled 2022-09-21 (×2): qty 1

## 2022-09-21 MED ORDER — SPIRONOLACTONE 25 MG PO TABS
25.0000 mg | ORAL_TABLET | Freq: Every day | ORAL | Status: DC
  Filled 2022-09-21: qty 1

## 2022-09-21 MED ORDER — BUMETANIDE 1 MG PO TABS
0.5000 mg | ORAL_TABLET | Freq: Every day | ORAL | Status: DC
  Filled 2022-09-21: qty 1

## 2022-09-21 MED ORDER — SIMETHICONE 80 MG PO CHEW: 240.0000 mg | CHEWABLE_TABLET | Freq: Two times a day (BID) | ORAL | Status: DC | PRN

## 2022-09-21 NOTE — Consult Note (Signed)
Consultation  Referring Provider: ERMD/ Evangeline Gula Primary Care Physician:  Gifford Shave, MD Primary Gastroenterologist:  Dr.Mansouraty  Reason for Consultation: Severe alcoholic hepatitis, cirrhosis, lethargy   HPI: Jamie Coleman is a 51 y.o. female well-known to Dr. Rush Landmark, who was recently admitted 11/22 through 09/07/2022 with severe alcoholic hepatitis and underlying cirrhosis (new diagnosis).  She had previous history of EtOH hepatitis December 2022, had stopped EtOH, then relapsed in September 2023.  Her last alcohol intake was on Friday, 08/14/2022.  Just prior to that she had noticed onset of jaundice and dark urine.  She was started on prednisolone as an outpatient, however discriminant function score on 08/18/2022 was 56.7.  Her parameters worsened and required admission.  She has completed a 30-day course of prednisolone with last dose on 09/17/2022.  She was discharged home on low-dose lactulose 15 to 20 cc daily, Bumex 1 mg daily, and Aldactone 50 mg twice daily.  In addition has been taking vitamin K. Meld-NA 29 , MELD 3.0 equals 28 at the time of discharge Parameters have been followed very closely as an outpatient She did require decrease in her diuretics last week to 0.5 mg Bumex daily and Aldactone 50 mg once daily. Plan has been to get her an expedited appointment with the liver transplant service at [facility]. She and her husband both say that she has been compliant with alcohol abstinence, and has started going to [place].  She also had an initial meeting with a psychologist with [facility] today for substance abuse counseling.  She and her husband had called today because she has had increase in severe fatigue and lethargy over the past few days.  She says she feels exhausted and has been having a hard time getting out of bed.  She lost her appetite over the weekend though she has been pushing herself to eat.  She has not had any nausea or vomiting no fever or  chills, no diarrhea.  She has continued to take lactulose low-dose somewhat varying doses did take 25 cc yesterday and has had multiple bowel movements today Denies any shortness of breath, cough, sore throat headache etc.  Abdominal pain She has gradually lost from 170 to 168 pounds over the past week and a half.  She says she feels shaky and weak when she is up ambulating, she has not had any falls Husband has not noticed any alteration in mental status or confusion.  Labs today WBC 19.4/hemoglobin 13/hematocrit 36/MCV 101/platelets 174 Pro time 21.5/INR 1.9 down from 0.7 last week Sodium 127/potassium 4.9/BUN 27/creatinine 0.7 T. bili 25.8/alk phos 200/AST 134/ALT 70 Venous ammonia 42  Current discriminant function score of 65 current M ELD equals 8 M ELD NA 31         Past Medical History:  Diagnosis Date   Allergy    allergic rhinitis   Anxiety    situational   COVID 05/2021   Difficult intubation    states 2012 ablation surgery, had diff intubation,glide scope used, not noted in EPIC anesthesia record   Elevated hemoglobin A1c 08/08/2015   Elevated liver function tests 08/08/2015   GERD (gastroesophageal reflux disease)    Hyperlipidemia    Low serum vitamin D 08/08/2015   Status post endometrial ablation    Tennis elbow    currently in PT-10/16    Past Surgical History:  Procedure Laterality Date   BIOPSY  02/09/2022   Procedure: BIOPSY;  Surgeon: Irving Copas., MD;  Location: [facility];  Service: Gastroenterology;;   CESAREAN SECTION     COLONOSCOPY WITH PROPOFOL N/A 02/09/2022   Procedure: COLONOSCOPY WITH PROPOFOL;  Surgeon: Rush Landmark Telford Nab., MD;  Location: [facility];  Service: Gastroenterology;  Laterality: N/A;   ENDOMETRIAL ABLATION     8/12   ESOPHAGOGASTRODUODENOSCOPY (EGD) WITH PROPOFOL N/A 02/09/2022   Procedure: ESOPHAGOGASTRODUODENOSCOPY (EGD) WITH PROPOFOL;  Surgeon: Rush Landmark Telford Nab., MD;  Location: [facility];   Service: Gastroenterology;  Laterality: N/A;   EXTREMITY WIRE/PIN REMOVAL Left 07/16/2016   Procedure: PIN REMOVAL OF LEFT WRIST X 2;  Surgeon: Roseanne Kaufman, MD;  Location: [place];  Service: Orthopedics;  Laterality: Left;   FOOT SURGERY Left    LAPAROSCOPIC TUBAL LIGATION  06/09/2011   Procedure: LAPAROSCOPIC TUBAL LIGATION;  Surgeon: Arloa Koh;  Location: [facility];  Service: Gynecology;  Laterality: N/A;   LIGAMENT REPAIR Left 05/08/2016   Procedure: LEFT WRIST SCAPHOLUNATE LIGAMENT RECONSTRUCTION WITH TENDON GRAFT PIN NEURECTOMY AND REPAIR;  Surgeon: Roseanne Kaufman, MD;  Location: [place];  Service: Orthopedics;  Laterality: Left;   POLYPECTOMY  02/09/2022   Procedure: POLYPECTOMY;  Surgeon: Mansouraty, Telford Nab., MD;  Location: [facility];  Service: Gastroenterology;;   TONSILLECTOMY     as child    Prior to Admission medications   Medication Sig Start Date End Date Taking? Authorizing Provider  Ascorbic Acid (VITAMIN C) 1000 MG tablet Take 1,000 mg by mouth daily.   Yes [provider]  Brimonidine Tartrate (LUMIFY) 0.025 % SOLN Place 1 drop into both eyes as needed (brighten eyes).   Yes [provider]  bumetanide (BUMEX) 1 MG tablet Take 1 tablet (1 mg total) by mouth daily. Patient taking differently: Take 0.5 mg by mouth daily. 09/08/22  Yes Dwyane Dee, MD  Calcium Citrate-Vitamin D (CALCIUM + D PO) Take 1 tablet by mouth daily.   Yes [provider]  Cholecalciferol (VITAMIN D) 50 MCG (2000 UT) CAPS Take 2,000 Units by mouth 3 (three) times a week. Mon, Wed, Friday   Yes [provider]  fexofenadine (ALLEGRA) 180 MG tablet Take 180 mg by mouth daily as needed for allergies.   Yes [provider]  folic acid (FOLVITE) 1 MG tablet Take 1 mg by mouth daily.   Yes [provider]  hydrocortisone (ANUSOL-HC) 25 MG suppository Place 1 suppository (25 mg total) rectally at bedtime as  needed for hemorrhoids or anal itching. Patient taking differently: Place 25 mg rectally daily as needed for hemorrhoids or anal itching. 02/09/22 02/09/23 Yes Mansouraty, Telford Nab., MD  lactulose (CHRONULAC) 10 GM/15ML solution Take 30 mLs (20 g total) by mouth daily. Patient taking differently: Take 10 g by mouth daily. 09/19/22  Yes Zehr, Laban Emperor, PA-C  Multiple Vitamin (MULTIVITAMIN) capsule Take 1 capsule by mouth daily.     Yes [provider]  omeprazole (PRILOSEC) 20 MG capsule Take 20 mg by mouth daily.   Yes [provider]  phytonadione (VITAMIN K) 5 MG tablet Take 1 tablet by mouth once daily for 14 days. 09/09/22  Yes Mansouraty, Telford Nab., MD  Polyvinyl Alcohol-Povidone (REFRESH OP) Place 1-2 drops into both eyes 4 (four) times daily as needed (dry eye).   Yes [provider]  potassium chloride SA (KLOR-CON M) 20 MEQ tablet Take 1 tablet (20 mEq total) by mouth daily. 09/07/22  Yes Dwyane Dee, MD  Simethicone 250 MG CAPS Take 250 mg by mouth 2 (two) times daily as needed (flatulence).  Yes [provider]  spironolactone (ALDACTONE) 100 MG tablet Take 1 tablet (100 mg total) by mouth daily. Patient taking differently: Take 50 mg by mouth daily. 09/08/22  Yes Dwyane Dee, MD  thiamine 500 MG tablet Take 500 mg by mouth daily.   Yes [provider]  lactulose (CHRONULAC) 10 GM/15ML solution Take 30 mLs (20 g total) by mouth daily. 09/07/22   Dwyane Dee, MD    Current Facility-Administered Medications  Medication Dose Route Frequency Provider Last Rate Last Admin   [START ON 09/22/2022] lactulose (CHRONULAC) 10 GM/15ML solution 20 g  20 g Oral Daily Reynold Mantell S, PA-C       phytonadione (VITAMIN K) tablet 5 mg  5 mg Oral Daily Aibhlinn Kalmar S, PA-C       Current Outpatient Medications  Medication Sig Dispense Refill   Ascorbic Acid (VITAMIN C) 1000 MG tablet Take 1,000 mg by mouth daily.     Brimonidine Tartrate  (LUMIFY) 0.025 % SOLN Place 1 drop into both eyes as needed (brighten eyes).     bumetanide (BUMEX) 1 MG tablet Take 1 tablet (1 mg total) by mouth daily. (Patient taking differently: Take 0.5 mg by mouth daily.) 30 tablet 3   Calcium Citrate-Vitamin D (CALCIUM + D PO) Take 1 tablet by mouth daily.     Cholecalciferol (VITAMIN D) 50 MCG (2000 UT) CAPS Take 2,000 Units by mouth 3 (three) times a week. Mon, Wed, Friday     fexofenadine (ALLEGRA) 180 MG tablet Take 180 mg by mouth daily as needed for allergies.     folic acid (FOLVITE) 1 MG tablet Take 1 mg by mouth daily.     hydrocortisone (ANUSOL-HC) 25 MG suppository Place 1 suppository (25 mg total) rectally at bedtime as needed for hemorrhoids or anal itching. (Patient taking differently: Place 25 mg rectally daily as needed for hemorrhoids or anal itching.) 12 suppository 1   lactulose (CHRONULAC) 10 GM/15ML solution Take 30 mLs (20 g total) by mouth daily. (Patient taking differently: Take 10 g by mouth daily.) 1800 mL 1   Multiple Vitamin (MULTIVITAMIN) capsule Take 1 capsule by mouth daily.       omeprazole (PRILOSEC) 20 MG capsule Take 20 mg by mouth daily.     phytonadione (VITAMIN K) 5 MG tablet Take 1 tablet by mouth once daily for 14 days. 14 tablet 0   Polyvinyl Alcohol-Povidone (REFRESH OP) Place 1-2 drops into both eyes 4 (four) times daily as needed (dry eye).     potassium chloride SA (KLOR-CON M) 20 MEQ tablet Take 1 tablet (20 mEq total) by mouth daily. 30 tablet 1   Simethicone 250 MG CAPS Take 250 mg by mouth 2 (two) times daily as needed (flatulence).     spironolactone (ALDACTONE) 100 MG tablet Take 1 tablet (100 mg total) by mouth daily. (Patient taking differently: Take 50 mg by mouth daily.) 30 tablet 3   thiamine 500 MG tablet Take 500 mg by mouth daily.     lactulose (CHRONULAC) 10 GM/15ML solution Take 30 mLs (20 g total) by mouth daily. 236 mL 0    Allergies as of 09/21/2022 - Review Complete 09/21/2022  Allergen  Reaction Noted   Lipitor [atorvastatin] Other (See Comments) 08/07/2013    Family History  Problem Relation Age of Onset   Cancer Mother        CA insitu of appendix   Heart disease Father        CAD   Diabetes Father  type II   Alzheimer's disease Father    Colon polyps Father    Diabetes Brother        type II   Diabetes Maternal Grandfather    Diabetes Paternal Grandmother    Diabetes Paternal Grandfather    Breast cancer Neg Hx    Colon cancer Neg Hx    Stomach cancer Neg Hx    Esophageal cancer Neg Hx    Pancreatic cancer Neg Hx     Social History   Socioeconomic History   Marital status: Married    Spouse name: Not on file   Number of children: 2   Years of education: Not on file   Highest education level: Not on file  Occupational History   Occupation: Nurse  Tobacco Use   Smoking status: Former    Types: Cigarettes    Quit date: 07/25/1983    Years since quitting: 39.1   Smokeless tobacco: Never   Tobacco comments:    in high school  Vaping Use   Vaping Use: Never used  Substance and Sexual Activity   Alcohol use: Not Currently    Alcohol/week: 14.0 standard drinks of alcohol    Types: 14 Glasses of wine per week    Comment: 6 shots daily 12 shots daily on weekends   Drug use: No   Sexual activity: Yes    Partners: Male    Birth control/protection: Surgical    Comment: Tubal ligation  Other Topics Concern   Not on file  Social History Narrative   Not on file   Social Determinants of Health   Financial Resource Strain: Not on file  Food Insecurity: No Food Insecurity (09/02/2022)   Hunger Vital Sign    Worried About Running Out of Food in the Last Year: Never true    Ran Out of Food in the Last Year: Never true  Transportation Needs: No Transportation Needs (09/02/2022)   PRAPARE - Hydrologist (Medical): No    Lack of Transportation (Non-Medical): No  Physical Activity: Not on file  Stress: Not on file   Social Connections: Not on file  Intimate Partner Violence: Not At Risk (09/02/2022)   Humiliation, Afraid, Rape, and Kick questionnaire    Fear of Current or Ex-Partner: No    Emotionally Abused: No    Physically Abused: No    Sexually Abused: No    Review of Systems: Pertinent positive and negative review of systems were noted in the above HPI section.  All other review of systems was otherwise negative.   Physical Exam: Vital signs in last 24 hours: Temp:  [98.2 F (36.8 C)] 98.2 F (36.8 C) (12/11 1144) Pulse Rate:  [87-101] 87 (12/11 1515) Resp:  [18-22] 22 (12/11 1515) BP: (99-109)/(60-62) 99/62 (12/11 1515) SpO2:  [97 %-98 %] 97 % (12/11 1515) Weight:  [76.7 kg] 76.7 kg (12/11 1145)   General:   Alert,  Well-developed, well-nourished, ill-appearing, very jaundiced white female pleasant and cooperative in NAD-husband at bedside Head:  Normocephalic and atraumatic. Eyes:  Sclera icteric   conjunctiva pink. Ears:  Normal auditory acuity. Nose:  No deformity, discharge,  or lesions. Mouth:  No deformity or lesions.   Neck:  Supple; no masses or thyromegaly. Lungs:  Clear throughout to auscultation.   No wheezes, crackles, or rhonchi. Heart:  Regular rate and rhythm; no murmurs, clicks, rubs,  or gallops. Abdomen:  Soft,nontender, BS active,nonpalp mass or hsm.  No appreciable fluid wave Rectal: Not  done Msk:  Symmetrical without gross deformities. . Pulses:  Normal pulses noted. Extremities:  Without clubbing or edema.  Deeply jaundiced Neurologic:  Alert and  oriented x4;  grossly normal neurologically.  Mentation slightly slower than baseline but no asterixis Skin:  Intact without significant lesions or rashes.. Psych:  Alert and cooperative. Normal mood and affect.  Intake/Output from previous day: No intake/output data recorded. Intake/Output this shift: No intake/output data recorded.  Lab Results: Recent Labs    09/21/22 1150  WBC 19.4*  HGB 13.1  HCT  36.8  PLT 174   BMET Recent Labs    09/21/22 1150  NA 127*  K 4.9  CL 95*  CO2 19*  GLUCOSE 120*  BUN 27*  CREATININE 0.75  CALCIUM 9.1   LFT Recent Labs    09/21/22 1150  PROT 6.4*  ALBUMIN 2.3*  AST 134*  ALT 70*  ALKPHOS 240*  BILITOT 25.8*   PT/INR Recent Labs    09/21/22 1150  LABPROT 21.5*  INR 1.9*   Hepatitis Panel No results for input(s): "HEPBSAG", "HCVAB", "HEPAIGM", "HEPBIGM" in the last 72 hours.  IMPRESSION:  #82 51 year old white female with severe alcoholic hepatitis, who has completed a 30-day course of prednisolone as of 09/17/2022.  Now presenting with 3-day history of progressive fatigue and lethargy. Exam she does not have any overt signs of hepatic encephalopathy, and ammonia is 42  She may have a component of adrenal insufficiency after recent course of prednisolone. Need to rule out underlying infection   Her parameters overall are stable, and encouraging that INR has come down to 1.9.  She has been taking oral vitamin K at home 5 to 10 mg daily.  Current M ELD equals 23 M ELD-N/A equals 31  Plan; blood cultures, UA and culture, chest x-ray PA and lateral, ultrasound to assess for ascites and tap if fluid present, include Doppler Hold antibiotics Do not plan to restart steroids Check a.m. cortisol level 2 Gram sodium diet after ultrasound this evening Repeat labs in a.m. Will plan to continue vitamin K 5 mg daily orally Continue lower dose Bumex at 0.5 mg daily and Aldactone 25 mg twice daily  Dr. Rush Landmark has been in touch with [facility], and they are working on an initial transplant evaluation appointment to be scheduled prior to the end of the year    Amaziah Raisanen [place] PA-C 09/21/2022, 4:33 PM

## 2022-09-21 NOTE — H&P (Signed)
History and Physical    Jamie Coleman QVZ:[id number] DOB: Feb 11, 1971 DOA: 09/21/2022  PCP: Gifford Shave, MD   Chief Complaint: Fatigue/weakness  HPI: Jamie Coleman is a 51 y.o. female with medical history significant of cirrhosis, severe alcoholic hepatitis, obesity,, anxiety, alcohol use disorder(last reported drink November 3), GERD, diverticulosis and rectosigmoid hyperplastic polyps who was recently admitted to our facility on 11/22 through 11/27 found to have new diagnosis of underlying cirrhosis with profound liver abnormalities.  Patient was ultimately discharged on steroids, Bumex, Aldactone and vitamin K.  She presents as above with worsening symptoms of fatigue and weakness and jaundice.  GI has been consulted as they have been following with [facility] for transition to the liver transplant specialist there, hospitalist called for admission in the interim given her worsening clinical status over the past week.   ED Course: In ED patient had labs consistent with prior elevated ammonia, bilirubin, liver panel is elevated with low protein hyponatremia.  No further imaging of the abdomen at this time given patient's known disease state and prognosis.  Review of Systems: As per HPI worsening fatigue weakness lethargy -she denies nausea vomiting diarrhea constipation headache fevers chills or chest pain..   Assessment/Plan Active Problems:   Acute liver failure without hepatic coma   Alcoholic hepatitis   Cirrhosis, alcoholic (HCC)   Anxiety disorder   GERD   Fatigue   Alcohol dependence (HCC)   Macrocytosis without anemia   Severe alcoholic hepatitis, acute on chronic Chronic cirrhosis History of alcohol abuse disorder -GI following, appreciate insight and recommendations, ultimate plan last month was to transition patient to [facility] transplant service for further evaluation and treatment, transfer to their facility is pending at this time. -Hold further steroids per GI,  follow cultures to rule out any underlying infectious process -Repeat imaging pending (abdominal ultrasound) -Continue previous medications including diuretics, low-salt diet vitamin K supplementation  Acute hepatic encephalopathy, hyperammonemia, fatigue, lethargy Rule out adrenal insufficiency -Cortisol level pending, patient has been on prednisone now for 1 month -Improving with supportive care, less likely adrenal insufficiency versus above  Anxiety -Continue home xanax  GERD -Continue PPI  DVT prophylaxis: Hold given liver failure/elevated INR  Code Status: Full  Family Communication: At bedside  Status is: Inpt  Dispo: The patient is from: Home              Anticipated d/c is to: TBD              Anticipated d/c date is: TBD likely >72h              Patient currently NOT medically stable for discharge given acute illness as above  Consultants:  GI  Procedures:  None   Past Medical History:  Diagnosis Date   Allergy    allergic rhinitis   Anxiety    situational   COVID 05/2021   Difficult intubation    states 2012 ablation surgery, had diff intubation,glide scope used, not noted in EPIC anesthesia record   Elevated hemoglobin A1c 08/08/2015   Elevated liver function tests 08/08/2015   GERD (gastroesophageal reflux disease)    Hyperlipidemia    Low serum vitamin D 08/08/2015   Status post endometrial ablation    Tennis elbow    currently in PT-10/16    Past Surgical History:  Procedure Laterality Date   BIOPSY  02/09/2022   Procedure: BIOPSY;  Surgeon: Irving Copas., MD;  Location: [facility];  Service: Gastroenterology;;   CESAREAN SECTION  COLONOSCOPY WITH PROPOFOL N/A 02/09/2022   Procedure: COLONOSCOPY WITH PROPOFOL;  Surgeon: Rush Landmark Telford Nab., MD;  Location: [facility];  Service: Gastroenterology;  Laterality: N/A;   ENDOMETRIAL ABLATION     8/12   ESOPHAGOGASTRODUODENOSCOPY (EGD) WITH PROPOFOL N/A 02/09/2022   Procedure:  ESOPHAGOGASTRODUODENOSCOPY (EGD) WITH PROPOFOL;  Surgeon: Rush Landmark Telford Nab., MD;  Location: [facility];  Service: Gastroenterology;  Laterality: N/A;   EXTREMITY WIRE/PIN REMOVAL Left 07/16/2016   Procedure: PIN REMOVAL OF LEFT WRIST X 2;  Surgeon: Roseanne Kaufman, MD;  Location: [facility];  Service: Orthopedics;  Laterality: Left;   FOOT SURGERY Left    LAPAROSCOPIC TUBAL LIGATION  06/09/2011   Procedure: LAPAROSCOPIC TUBAL LIGATION;  Surgeon: Arloa Koh;  Location: [facility];  Service: Gynecology;  Laterality: N/A;   LIGAMENT REPAIR Left 05/08/2016   Procedure: LEFT WRIST SCAPHOLUNATE LIGAMENT RECONSTRUCTION WITH TENDON GRAFT PIN NEURECTOMY AND REPAIR;  Surgeon: Roseanne Kaufman, MD;  Location: [facility];  Service: Orthopedics;  Laterality: Left;   POLYPECTOMY  02/09/2022   Procedure: POLYPECTOMY;  Surgeon: Mansouraty, Telford Nab., MD;  Location: [facility];  Service: Gastroenterology;;   TONSILLECTOMY     as child     reports that she quit smoking about 39 years ago. Her smoking use included cigarettes. She has never used smokeless tobacco. She reports that she does not currently use alcohol after a past usage of about 14.0 standard drinks of alcohol per week. She reports that she does not use drugs.  Allergies  Allergen Reactions   Lipitor [Atorvastatin] Other (See Comments)    myalgias    Family History  Problem Relation Age of Onset   Cancer Mother        CA insitu of appendix   Heart disease Father        CAD   Diabetes Father        type II   Alzheimer's disease Father    Colon polyps Father    Diabetes Brother        type II   Diabetes Maternal Grandfather    Diabetes Paternal Grandmother    Diabetes Paternal Grandfather    Breast cancer Neg Hx    Colon cancer Neg Hx    Stomach cancer Neg Hx    Esophageal cancer Neg Hx    Pancreatic cancer Neg Hx     Prior to Admission medications   Medication Sig Start Date End Date Taking?  Authorizing Provider  Ascorbic Acid (VITAMIN C) 1000 MG tablet Take 1,000 mg by mouth daily.   Yes [provider]  Brimonidine Tartrate (LUMIFY) 0.025 % SOLN Place 1 drop into both eyes as needed (brighten eyes).   Yes [provider]  bumetanide (BUMEX) 1 MG tablet Take 1 tablet (1 mg total) by mouth daily. Patient taking differently: Take 0.5 mg by mouth daily. 09/08/22  Yes Dwyane Dee, MD  Calcium Citrate-Vitamin D (CALCIUM + D PO) Take 1 tablet by mouth daily.   Yes [provider]  Cholecalciferol (VITAMIN D) 50 MCG (2000 UT) CAPS Take 2,000 Units by mouth 3 (three) times a week. Mon, Wed, Friday   Yes [provider]  fexofenadine (ALLEGRA) 180 MG tablet Take 180 mg by mouth daily as needed for allergies.   Yes [provider]  folic acid (FOLVITE) 1 MG tablet Take 1 mg by mouth daily.   Yes [provider]  hydrocortisone (ANUSOL-HC) 25 MG suppository Place 1 suppository (25 mg total) rectally at  bedtime as needed for hemorrhoids or anal itching. Patient taking differently: Place 25 mg rectally daily as needed for hemorrhoids or anal itching. 02/09/22 02/09/23 Yes Mansouraty, Telford Nab., MD  lactulose (CHRONULAC) 10 GM/15ML solution Take 30 mLs (20 g total) by mouth daily. Patient taking differently: Take 10 g by mouth daily. 09/19/22  Yes Zehr, Laban Emperor, PA-C  Multiple Vitamin (MULTIVITAMIN) capsule Take 1 capsule by mouth daily.     Yes [provider]  omeprazole (PRILOSEC) 20 MG capsule Take 20 mg by mouth daily.   Yes [provider]  phytonadione (VITAMIN K) 5 MG tablet Take 1 tablet by mouth once daily for 14 days. 09/09/22  Yes Mansouraty, Telford Nab., MD  Polyvinyl Alcohol-Povidone (REFRESH OP) Place 1-2 drops into both eyes 4 (four) times daily as needed (dry eye).   Yes [provider]  potassium chloride SA (KLOR-CON M) 20 MEQ tablet Take 1 tablet (20 mEq total) by mouth daily. 09/07/22  Yes  Dwyane Dee, MD  Simethicone 250 MG CAPS Take 250 mg by mouth 2 (two) times daily as needed (flatulence).   Yes [provider]  spironolactone (ALDACTONE) 100 MG tablet Take 1 tablet (100 mg total) by mouth daily. Patient taking differently: Take 50 mg by mouth daily. 09/08/22  Yes Dwyane Dee, MD  thiamine 500 MG tablet Take 500 mg by mouth daily.   Yes [provider]  lactulose (CHRONULAC) 10 GM/15ML solution Take 30 mLs (20 g total) by mouth daily. 09/07/22   Dwyane Dee, MD    Physical Exam: Vitals:   09/21/22 1515 09/21/22 1705 09/21/22 1714 09/21/22 1752  BP: 99/62 (!) 107/55  (!) 98/57  Pulse: 87 85 92 86  Resp: (!) 22 20 (!) 27 20  Temp:    98 F (36.7 C)  TempSrc:    Oral  SpO2: 97% 100% 98% 99%  Weight:      Height:        Constitutional: NAD, calm, comfortable Vitals:   09/21/22 1515 09/21/22 1705 09/21/22 1714 09/21/22 1752  BP: 99/62 (!) 107/55  (!) 98/57  Pulse: 87 85 92 86  Resp: (!) 22 20 (!) 27 20  Temp:    98 F (36.7 C)  TempSrc:    Oral  SpO2: 97% 100% 98% 99%  Weight:      Height:       General:  Pleasantly resting in bed, No acute distress. HEENT:  Normocephalic atraumatic.  Sclerae markedly icteric, noninjected. Neck:  Without mass or deformity.  Trachea is midline. Lungs:  Clear to auscultate bilaterally without rhonchi, wheeze, or rales. Heart:  Regular rate and rhythm.  Without murmurs, rubs, or gallops. Abdomen:  Soft, nontender, moderately distended.  Without guarding or rebound. Extremities: Without cyanosis, clubbing, edema, or obvious deformity. Vascular:  Dorsalis pedis and posterior tibial pulses palpable bilaterally. Skin:  Warm and dry, jaundiced.  Labs on Admission: I have personally reviewed following labs and imaging studies  CBC: Recent Labs  Lab 09/16/22 1136 09/18/22 1143 09/21/22 1150  WBC 17.2* 18.7 Repeated and verified X2.* 19.4*  NEUTROABS  --  14.8* 16.7*  HGB 13.0 13.0 13.1  HCT 37.5  37.6 36.8  MCV 105.4* 104.0* 101.1*  PLT 207.0 206.0 254   Basic Metabolic Panel: Recent Labs  Lab 09/16/22 1136 09/18/22 1143 09/21/22 1150  NA 126* 127* 127*  K 4.2 4.5 4.9  CL 89* 91* 95*  CO2 27 23 19*  GLUCOSE 150* 185* 120*  BUN 24*  27* 27*  CREATININE 0.95 0.92 0.75  CALCIUM 8.8 9.2 9.1   GFR: Estimated Creatinine Clearance: 78 mL/min (by C-G formula based on SCr of 0.75 mg/dL). Liver Function Tests: Recent Labs  Lab 09/16/22 1136 09/18/22 1143 09/21/22 1150  AST 130* 161* 134*  ALT 90* 87* 70*  ALKPHOS 227* 248* 240*  BILITOT 21.0* 23.6* 25.8*  PROT 6.1 6.3 6.4*  ALBUMIN 3.4* 3.3* 2.3*   Recent Labs  Lab 09/21/22 1150  LIPASE 59*   Recent Labs  Lab 09/21/22 1436  AMMONIA 42*   Coagulation Profile: Recent Labs  Lab 09/16/22 1136 09/18/22 1143 09/21/22 1150  INR 2.6* 2.7* 1.9*   Cardiac Enzymes: No results for input(s): "CKTOTAL", "CKMB", "CKMBINDEX", "TROPONINI" in the last 168 hours. BNP (last 3 results) No results for input(s): "PROBNP" in the last 8760 hours. HbA1C: No results for input(s): "HGBA1C" in the last 72 hours. CBG: No results for input(s): "GLUCAP" in the last 168 hours. Lipid Profile: No results for input(s): "CHOL", "HDL", "LDLCALC", "TRIG", "CHOLHDL", "LDLDIRECT" in the last 72 hours. Thyroid Function Tests: No results for input(s): "TSH", "T4TOTAL", "FREET4", "T3FREE", "THYROIDAB" in the last 72 hours. Anemia Panel: No results for input(s): "VITAMINB12", "FOLATE", "FERRITIN", "TIBC", "IRON", "RETICCTPCT" in the last 72 hours. Urine analysis:    Component Value Date/Time   COLORURINE AMBER (A) 09/01/2022 1845   APPEARANCEUR CLEAR 09/01/2022 1845   LABSPEC 1.010 09/01/2022 1845   PHURINE 6.0 09/01/2022 1845   GLUCOSEU NEGATIVE 09/01/2022 1845   HGBUR MODERATE (A) 09/01/2022 1845   BILIRUBINUR SMALL (A) 09/01/2022 1845   BILIRUBINUR n 08/19/2016 1658   KETONESUR NEGATIVE 09/01/2022 1845   PROTEINUR NEGATIVE 09/01/2022  1845   UROBILINOGEN 0.2 07/15/2020 1618   NITRITE NEGATIVE 09/01/2022 1845   LEUKOCYTESUR NEGATIVE 09/01/2022 1845    Radiological Exams on Admission: DG Chest 2 View  Result Date: 09/21/2022 CLINICAL DATA:  Provided history: Leukocytosis. Additional history provided: Liver failure, increased weakness and fatigue over the weekend, unsteady gait, jaundice. EXAM: CHEST - 2 VIEW COMPARISON:  Prior chest radiographs 09/01/2022 and earlier. FINDINGS: Heart size within normal limits. Subtle linear opacities within the left lung base consistent with minimal atelectasis. No appreciable airspace consolidation. No evidence of pleural effusion or pneumothorax. Degenerative changes of the spine. IMPRESSION: Minimal atelectasis within the left lung base. Otherwise, no evidence of acute cardiopulmonary abnormality. Electronically Signed   By: Kellie Simmering D.O.   On: 09/21/2022 17:15    EKG: Independently reviewed. NSR   Little Ishikawa DO [facility] For contact please use secure messenger on Epic  If 7PM-7AM, please contact night-coverage located on [email or web address]   09/21/2022, 6:00 PM

## 2022-09-21 NOTE — ED Provider Notes (Signed)
[facility] DEPT Provider Note   CSN: [id number] Arrival date & time: 09/21/22  1133     History  Chief Complaint  Patient presents with   Weakness    Jamie Coleman is a 51 y.o. female with alcoholic cirrhosis, GERD, anxiety, dermatofibroma, HLD, anxiety, IBS, presents with weakness.   Pt complains of worsening lethargy and fatigue for approximately 1 week, a/w dizziness.  She was recently diagnosed with cirrhosis and has associated jaundice.  He said the jaundice has maybe slightly worsened over the past couple of weeks.  She also endorses some various abdominal pain in various spots, currently reporting central abdominal pain.  She has not had any fevers or chills.  She denies any nausea or vomiting, chest pain, shortness of breath, urinary or vaginal symptoms. Takes lactulose and has recently been trying to titrate her dose to 3 BMs per day as instructed, she says today she went a little overboard and has had >10 BMs today. Has not had any appetite but has been able to eat/drink if she forces herself. No melena/hematochezia, hematemesis. Husband at bedside states that she was recently on one month of prednisolone that was stopped last week.   Per chart review patient was admitted from 09/01/2022 to 09/07/2022 after having been under treatment for alcoholic hepatitis with prednisolone but total bilirubin began trending up and after concern for decompensation she was admitted.  INR was elevated to 2.6 and she received vitamin K in the hospital and was discharged with as an outpatient.  Completed a course of NAC, started lactulose, and is awaiting referral to [facility] and [facility] transplant hepatology.  Takes spironolactone 100 mg daily, Bumex 1 mg daily and K-Dur 20 mill equivalents daily.    Weakness      Home Medications Prior to Admission medications   Medication Sig Start Date End Date Taking? Authorizing Provider  Ascorbic Acid (VITAMIN C) 1000 MG tablet  Take 1,000 mg by mouth daily.   Yes [provider]  Brimonidine Tartrate (LUMIFY) 0.025 % SOLN Place 1 drop into both eyes as needed (brighten eyes).   Yes [provider]  bumetanide (BUMEX) 1 MG tablet Take 1 tablet (1 mg total) by mouth daily. Patient taking differently: Take 0.5 mg by mouth daily. 09/08/22  Yes Dwyane Dee, MD  Calcium Citrate-Vitamin D (CALCIUM + D PO) Take 1 tablet by mouth daily.   Yes [provider]  Cholecalciferol (VITAMIN D) 50 MCG (2000 UT) CAPS Take 2,000 Units by mouth 3 (three) times a week. Mon, Wed, Friday   Yes [provider]  fexofenadine (ALLEGRA) 180 MG tablet Take 180 mg by mouth daily as needed for allergies.   Yes [provider]  folic acid (FOLVITE) 1 MG tablet Take 1 mg by mouth daily.   Yes [provider]  hydrocortisone (ANUSOL-HC) 25 MG suppository Place 1 suppository (25 mg total) rectally at bedtime as needed for hemorrhoids or anal itching. Patient taking differently: Place 25 mg rectally daily as needed for hemorrhoids or anal itching. 02/09/22 02/09/23 Yes Mansouraty, Telford Nab., MD  lactulose (CHRONULAC) 10 GM/15ML solution Take 30 mLs (20 g total) by mouth daily. Patient taking differently: Take 10 g by mouth daily. 09/19/22  Yes Zehr, Laban Emperor, PA-C  Multiple Vitamin (MULTIVITAMIN) capsule Take 1 capsule by mouth daily.     Yes [provider]  omeprazole (PRILOSEC) 20 MG capsule Take 20 mg by mouth daily.   Yes [provider]  phytonadione (  VITAMIN K) 5 MG tablet Take 1 tablet by mouth once daily for 14 days. 09/09/22  Yes Mansouraty, Telford Nab., MD  Polyvinyl Alcohol-Povidone (REFRESH OP) Place 1-2 drops into both eyes 4 (four) times daily as needed (dry eye).   Yes [provider]  potassium chloride SA (KLOR-CON M) 20 MEQ tablet Take 1 tablet (20 mEq total) by mouth daily. 09/07/22  Yes Dwyane Dee, MD  Simethicone 250 MG CAPS Take 250 mg by mouth 2  (two) times daily as needed (flatulence).   Yes [provider]  spironolactone (ALDACTONE) 100 MG tablet Take 1 tablet (100 mg total) by mouth daily. Patient taking differently: Take 50 mg by mouth daily. 09/08/22  Yes Dwyane Dee, MD  thiamine 500 MG tablet Take 500 mg by mouth daily.   Yes [provider]  lactulose (CHRONULAC) 10 GM/15ML solution Take 30 mLs (20 g total) by mouth daily. 09/07/22   Dwyane Dee, MD      Allergies    Lipitor [atorvastatin]    Review of Systems   Review of Systems  Neurological:  Positive for weakness.   Review of systems Negative for f/c.  A 10 point review of systems was performed and is negative unless otherwise reported in HPI.  Physical Exam Updated Vital Signs BP 99/62   Pulse 87   Temp 98.2 F (36.8 C) (Oral)   Resp (!) 22   Ht 5' 1.5" (1.562 m)   Wt 76.7 kg   SpO2 97%   BMI 31.42 kg/m  Physical Exam General: Tired appearing female, lying in bed. Jaundiced. HEENT: PERRLA, Sclera icteric, MMM, trachea midline.  Cardiology: RRR, no murmurs/rubs/gallops. BL radial and DP pulses equal bilaterally.  Resp: Normal respiratory rate and effort. CTAB, no wheezes, rhonchi, crackles.  Abd: Epigastric, periumbilical TTP. Soft, non-distended. No rebound tenderness or guarding.  GU: Deferred. MSK: No peripheral edema or signs of trauma. Extremities without deformity or TTP. No cyanosis or clubbing. Skin: Jaundiced. warm, dry. No rashes or lesions. Neuro: A&Ox4, CNs II-XII grossly intact. MAEs. Sensation grossly intact.  Psych: Normal mood and affect.   ED Results / Procedures / Treatments   Labs (all labs ordered are listed, but only abnormal results are displayed) Labs Reviewed  CBC WITH DIFFERENTIAL/PLATELET - Abnormal; Notable for the following components:      Result Value   WBC 19.4 (*)    RBC 3.64 (*)    MCV 101.1 (*)    MCH 36.0 (*)    RDW 15.8 (*)    Neutro Abs 16.7 (*)    Monocytes Absolute 1.3 (*)    Abs  Immature Granulocytes 0.29 (*)    All other components within normal limits  COMPREHENSIVE METABOLIC PANEL - Abnormal; Notable for the following components:   Sodium 127 (*)    Chloride 95 (*)    CO2 19 (*)    Glucose, Bld 120 (*)    BUN 27 (*)    Total Protein 6.4 (*)    Albumin 2.3 (*)    AST 134 (*)    ALT 70 (*)    Alkaline Phosphatase 240 (*)    Total Bilirubin 25.8 (*)    All other components within normal limits  LIPASE, BLOOD - Abnormal; Notable for the following components:   Lipase 59 (*)    All other components within normal limits  PROTIME-INR - Abnormal; Notable for the following components:   Prothrombin Time 21.5 (*)    INR 1.9 (*)  All other components within normal limits  AMMONIA - Abnormal; Notable for the following components:   Ammonia 42 (*)    All other components within normal limits  RESP PANEL BY RT-PCR (RSV, FLU A&B, COVID)  RVPGX2  LACTIC ACID, PLASMA  URINALYSIS, ROUTINE W REFLEX MICROSCOPIC    EKG EKG Interpretation  Date/Time:  Monday September 21 2022 11:47:47 EST Ventricular Rate:  100 PR Interval:  147 QRS Duration: 86 QT Interval:  345 QTC Calculation: 445 R Axis:   95 Text Interpretation: Sinus tachycardia Borderline right axis deviation Confirmed by Cindee Lame [phone]) on 09/21/2022 2:59:36 PM  Radiology No results found.  Procedures Procedures    Medications Ordered in ED Medications - No data to display  ED Course/ Medical Decision Making/ A&P                          Medical Decision Making Amount and/or Complexity of Data Reviewed Labs: ordered. Decision-making details documented in ED Course.  Risk Decision regarding hospitalization.   This patient presents to the ED for concern of fatigue/jaundice, this involves an extensive number of treatment options, and is a complaint that carries with it a high risk of complications and morbidity.  I considered the following differential and admission for this acute,  potentially life threatening condition.   MDM:    DDX for generalized weakness, fatigue includes but is not limited to: Patient with known liver failure and alcoholic hepatitis recently admitted for the same. Worsening of symptoms seeming to coincide with stopping prednisolone. Consider hepatic encephalopathy given her report of trouble titrating her lactulose and lethargy. She is A&Ox4 at this time. She has no abdominal pain but is mildly tender on exam, no fever to indicate SBP and no abdominal fluid wave or swelling to indicate ascites. Her fatigue/jaundice is worsening raising concern for worsening liver failure.  Consider also infectious processes though she has no localizing symptoms of any infection, severe metabolic derangements, anemia, or electrolyte abnormalities. No CP or SOB to indicate ischemia/ACS, heart failure. Will obtain labs including LFTs and ammonia and likely d/w GI. Patient has known alcohol hepatitis and do not believe emergent imaging is required at this time.   Clinical Course as of 10/13/22 1521  Mon Sep 21, 2022  1458 Total Bilirubin(!!): 25.8 Steadily increasing [HN]  1458 Platelets: 174 [HN]  1459 Hemoglobin: 13.1 [HN]  1459 WBC(!): 19.4 [HN]  1459 Lactic Acid, Venous: 1.2 [HN]  1459 INR(!): 1.9 [HN]  1459 Creatinine: 0.75 [HN]  1459 BUN(!): 27 [HN]  1459 CO2(!): 19 [HN]  1459 Sodium(!): 127 [HN]  1459 Lipase(!): 59 [HN]  1510 Ammonia(!): 42 [HN]  1730 CXR with atelectasis but no PNA.  [HN]  1733 Did not speak with GI but they dropped a note with plan, to include:  blood cultures, UA and culture, chest x-ray PA and lateral, ultrasound to assess for ascites and tap if fluid present, include Doppler Hold antibiotics Do not plan to restart steroids Check a.m. cortisol level 2 Gram sodium diet after ultrasound this evening Repeat labs in a.m. Will plan to continue vitamin K 5 mg daily orally Continue lower dose Bumex at 0.5 mg daily and Aldactone 25 mg twice  daily   Will consult to hospitalist for admission [HN]  1734 Patient is signed out to the oncoming ED physician who is made aware of his history, presentation, exam, workup, and plan.   [HN]    Clinical Course User Index [  HN] Audley Hose, MD     Labs: I Ordered, and personally interpreted labs.  The pertinent results include:  those listed above  Additional history obtained from chart review.   Cardiac Monitoring: The patient was maintained on a cardiac monitor.  I personally viewed and interpreted the cardiac monitored which showed an underlying rhythm of: NSR  Social Determinants of Health: Patient lives independently   Disposition:  Admit to hospitalist  Co morbidities that complicate the patient evaluation  Past Medical History:  Diagnosis Date   Allergy    allergic rhinitis   Anxiety    situational   COVID 05/2021   Difficult intubation    states 2012 ablation surgery, had diff intubation,glide scope used, not noted in EPIC anesthesia record   Elevated hemoglobin A1c 08/08/2015   Elevated liver function tests 08/08/2015   GERD (gastroesophageal reflux disease)    Hyperlipidemia    Low serum vitamin D 08/08/2015   Status post endometrial ablation    Tennis elbow    currently in PT-10/16     Medicines No orders of the defined types were placed in this encounter.   I have reviewed the patients home medicines and have made adjustments as needed  Problem List / ED Course: Problem List Items Addressed This Visit       Digestive   Acute liver failure without hepatic coma   Cirrhosis, alcoholic ([place])   Acute on chronic alcoholic liver disease (HCC)   Decompensation of cirrhosis of liver (HCC) - Primary     Nervous and Auditory   * (Principal) Acute hepatic encephalopathy (HCC)     Other   Fatigue   Macrocytosis without anemia   History of alcoholic hepatitis   Lethargy   Hyponatremia   Other Visit Diagnoses     Serum total bilirubin elevated                      This note was created using dictation software, which may contain spelling or grammatical errors.    Audley Hose, MD 10/13/22 [phone]

## 2022-09-21 NOTE — Telephone Encounter (Signed)
Patient is calling in on behalf of wife. She has cirrhosis and is very fatigued and sick. He wants to know should they come in for lab work or go to ED. Please advise. Thank  you.

## 2022-09-21 NOTE — Telephone Encounter (Signed)
See results note dated 12/11

## 2022-09-21 NOTE — ED Provider Notes (Signed)
  Physical Exam  BP 99/62   Pulse 87   Temp 98.2 F (36.8 C) (Oral)   Resp (!) 22   Ht 5' 1.5" (1.562 m)   Wt 76.7 kg   SpO2 97%   BMI 31.42 kg/m     Procedures  Procedures  ED Course / MDM   Clinical Course as of 09/21/22 1739  Mon Sep 21, 2022  1458 Total Bilirubin(!!): 25.8 Steadily increasing [HN]  1458 Platelets: 174 [HN]  1459 Hemoglobin: 13.1 [HN]  1459 WBC(!): 19.4 [HN]  1459 Lactic Acid, Venous: 1.2 [HN]  1459 INR(!): 1.9 [HN]  1459 Creatinine: 0.75 [HN]  1459 BUN(!): 27 [HN]  1459 CO2(!): 19 [HN]  1459 Sodium(!): 127 [HN]  1459 Lipase(!): 59 [HN]  1510 Ammonia(!): 42 [HN]  1730 CXR with atelectasis but no PNA.  [HN]  1733 Did not speak with GI but they dropped a note with plan, to include:  blood cultures, UA and culture, chest x-ray PA and lateral, ultrasound to assess for ascites and tap if fluid present, include Doppler Hold antibiotics Do not plan to restart steroids Check a.m. cortisol level 2 Gram sodium diet after ultrasound this evening Repeat labs in a.m. Will plan to continue vitamin K 5 mg daily orally Continue lower dose Bumex at 0.5 mg daily and Aldactone 25 mg twice daily   Will consult to hospitalist for admission [HN]  1734 Patient is signed out to the oncoming ED physician who is made aware of his history, presentation, exam, workup, and plan.   [HN]    Clinical Course User Index [HN] Audley Hose, MD   Medical Decision Making Amount and/or Complexity of Data Reviewed Labs: ordered. Decision-making details documented in ED Course.  Risk Decision regarding hospitalization.   61F presenting with an elevated t bili of 25.8, direct bili pending, GI consulted due to jaundice.   GI evaluated the child and will see inpatient.  Hospitalist medicine was consulted for admission and the patient was subsequently admitted in stable condition.  T. bili resulted elevated to 2.1 concerning for obstructive jaundice in the setting of chronic  cirrhosis, severe alcoholic hepatitis.     Regan Lemming, MD 09/22/22 [phone]

## 2022-09-21 NOTE — Telephone Encounter (Signed)
Pt will be contacted by phone see 12/11 phone note

## 2022-09-21 NOTE — Telephone Encounter (Signed)
The pt husband called and wanted to make Dr Rush Landmark aware that he will be bringing the pt in for lethargy, fatigue and overall a feeling of "being unwell"  He was advised of the [facility] referral and labs in 1 week.  FYI

## 2022-09-21 NOTE — ED Triage Notes (Signed)
Patient said she has liver failure, increased fatigue/weakness over the weekend, unsteady gait. Patient has jaundice.

## 2022-09-21 NOTE — Telephone Encounter (Signed)
Inbound call from [place] with [facility] stating she got approval for the patient to be admitted as outpatient states she spoke with Dr.Mansouraty this am and wanted him aware that she got the approval. She is requesting a call back [phone]

## 2022-09-21 NOTE — ED Provider Triage Note (Signed)
Emergency Medicine Provider Triage Evaluation Note  Jamie Coleman , a 51 y.o. female  was evaluated in triage.  Pt complains of worsening lethargy and fatigue.  She was recently diagnosed with cirrhosis and has associated jaundice.  He said the jaundice has maybe slightly worsened over the past couple of weeks.  She also endorses some various abdominal pain in various spots.  She has not had any fevers or chills.  She denies any nausea or vomiting, chest pain, shortness of breath, ect.   Review of Systems  Positive:  Negative:   Physical Exam  BP 109/61   Pulse (!) 101   Temp 98.2 F (36.8 C) (Oral)   Resp 18   Ht 5' 1.5" (1.562 m)   Wt 76.7 kg   SpO2 98%   BMI 31.42 kg/m  Gen:   Awake, no distress   Resp:  Normal effort  MSK:   Moves extremities without difficulty  Other:  Abdomen distended, mild generalized tenderness, very jaundiced.   Medical Decision Making  Medically screening exam initiated at 11:51 AM.  Appropriate orders placed.  Jamie Coleman was informed that the remainder of the evaluation will be completed by another provider, this initial triage assessment does not replace that evaluation, and the importance of remaining in the ED until their evaluation is complete.     Adolphus Birchwood, PA-C 09/21/22 1152

## 2022-09-22 ENCOUNTER — Other Ambulatory Visit: Payer: Self-pay

## 2022-09-22 ENCOUNTER — Ambulatory Visit: Payer: 59 | Admitting: Nurse Practitioner

## 2022-09-22 ENCOUNTER — Other Ambulatory Visit: Payer: Self-pay | Admitting: Gastroenterology

## 2022-09-22 ENCOUNTER — Telehealth: Payer: Self-pay

## 2022-09-22 ENCOUNTER — Other Ambulatory Visit (HOSPITAL_COMMUNITY): Payer: Self-pay

## 2022-09-22 DIAGNOSIS — D7589 Other specified diseases of blood and blood-forming organs: Secondary | ICD-10-CM

## 2022-09-22 DIAGNOSIS — R5383 Other fatigue: Secondary | ICD-10-CM

## 2022-09-22 DIAGNOSIS — K7010 Alcoholic hepatitis without ascites: Secondary | ICD-10-CM

## 2022-09-22 DIAGNOSIS — Z8719 Personal history of other diseases of the digestive system: Secondary | ICD-10-CM

## 2022-09-22 DIAGNOSIS — E871 Hypo-osmolality and hyponatremia: Secondary | ICD-10-CM

## 2022-09-22 DIAGNOSIS — K709 Alcoholic liver disease, unspecified: Secondary | ICD-10-CM

## 2022-09-22 DIAGNOSIS — K7290 Hepatic failure, unspecified without coma: Secondary | ICD-10-CM

## 2022-09-22 DIAGNOSIS — K7200 Acute and subacute hepatic failure without coma: Secondary | ICD-10-CM

## 2022-09-22 DIAGNOSIS — K7460 Unspecified cirrhosis of liver: Secondary | ICD-10-CM

## 2022-09-22 LAB — COMPREHENSIVE METABOLIC PANEL
ALT: 58 U/L — ABNORMAL HIGH (ref 0–44)
AST: 135 U/L — ABNORMAL HIGH (ref 15–41)
Albumin: 2 g/dL — ABNORMAL LOW (ref 3.5–5.0)
Alkaline Phosphatase: 212 U/L — ABNORMAL HIGH (ref 38–126)
Anion gap: 11 (ref 5–15)
BUN: 28 mg/dL — ABNORMAL HIGH (ref 6–20)
CO2: 20 mmol/L — ABNORMAL LOW (ref 22–32)
Calcium: 8.8 mg/dL — ABNORMAL LOW (ref 8.9–10.3)
Chloride: 96 mmol/L — ABNORMAL LOW (ref 98–111)
Creatinine, Ser: 0.7 mg/dL (ref 0.44–1.00)
GFR, Estimated: >60 mL/min (ref >60)
Glucose, Bld: 111 mg/dL — ABNORMAL HIGH (ref 70–99)
Potassium: 4.9 mmol/L (ref 3.5–5.1)
Sodium: 127 mmol/L — ABNORMAL LOW (ref 135–145)
Total Bilirubin: 22.8 mg/dL (ref 0.3–1.2)
Total Protein: 5.4 g/dL — ABNORMAL LOW (ref 6.5–8.1)

## 2022-09-22 LAB — PROTIME-INR
INR: 1.9 — ABNORMAL HIGH (ref 0.8–1.2)
Prothrombin Time: 21.6 seconds — ABNORMAL HIGH (ref 11.4–15.2)

## 2022-09-22 LAB — AMMONIA: Ammonia: 63 umol/L — ABNORMAL HIGH (ref 9–35)

## 2022-09-22 LAB — CORTISOL-AM, BLOOD: Cortisol - AM: 11.8 ug/dL (ref 6.7–22.6)

## 2022-09-22 MED ORDER — ORAL CARE MOUTH RINSE: 15.0000 mL | OROMUCOSAL | Status: DC | PRN

## 2022-09-22 MED ORDER — ALPRAZOLAM 0.5 MG PO TABS
0.5000 mg | ORAL_TABLET | Freq: Two times a day (BID) | ORAL | 0 refills | Status: DC | PRN
  Filled 2022-09-22: qty 30, 15d supply, fill #0

## 2022-09-22 MED ORDER — PHYTONADIONE 5 MG PO TABS
5.0000 mg | ORAL_TABLET | Freq: Every day | ORAL | 0 refills | Status: AC
  Filled 2022-09-22: qty 14, 14d supply, fill #0

## 2022-09-22 NOTE — Addendum Note (Signed)
Addended by: Loura Pardon A on: 09/22/2022 05:20 PM   Modules accepted: Orders

## 2022-09-22 NOTE — Telephone Encounter (Signed)
I am not sure how to help this patient nurses do not handle FMLA.  Jamie Coleman may be able to help.

## 2022-09-22 NOTE — Progress Notes (Signed)
.   Transition of Care [facility]) Screening Note   Patient Details  Name: Jamie Coleman Date of Birth: [birth year]   Transition of Care [facility]) CM/SW Contact:    Illene Regulus, LCSW Phone Number: 09/22/2022, 11:56 AM    Transition of Care Department [facility]) has reviewed patient and no TOC needs have been identified at this time. We will continue to monitor patient advancement through interdisciplinary progression rounds. If new patient transition needs arise, please place a TOC consult.

## 2022-09-22 NOTE — Telephone Encounter (Signed)
-----  Message from Alfredia Ferguson, PA-C sent at 09/22/2022 11:35 AM EST ----- Regarding: labs, and VitK Beth, patient needs to come to the office for labs on Friday, 09/25/2022 for CBC, c-Met, venous ammonia and INR--reason severe alcoholic hepatitis  Put the orders under Dr. Donneta Romberg name  Also please send a prescription to her regular pharmacy for vitamin K 5 mg orally 1 p.o. daily #14 and no refills

## 2022-09-22 NOTE — Telephone Encounter (Signed)
Left message on machine to have [place] call back

## 2022-09-22 NOTE — Discharge Summary (Addendum)
Physician Discharge Summary  Jamie Coleman GYF:[id number] DOB: 1971/01/29 DOA: 09/21/2022  PCP: Gifford Shave, MD  Admit date: 09/21/2022 Discharge date: 09/22/2022  Admitted From: Home Disposition: Home  Recommendations for Outpatient Follow-up:  Follow up with PCP in 1-2 weeks Follow-up with GI as scheduled both locally and at [facility] as discussed  [place]: None Equipment/Devices: None   Discharge Condition: Stable CODE STATUS: Full Diet recommendation: Low-salt low-fat fluid restricted diet  Brief/Interim Summary: Jamie Coleman is a 51 y.o. female with medical history significant of cirrhosis, severe alcoholic hepatitis, obesity,, anxiety, alcohol use disorder(last reported drink November 3), GERD, diverticulosis and rectosigmoid hyperplastic polyps who was recently admitted to our facility on 11/22 through 11/27 found to have new diagnosis of underlying cirrhosis with profound liver abnormalities.  Patient was ultimately discharged on steroids, Bumex, Aldactone and vitamin K.  She presents as above with worsening symptoms of fatigue and weakness and jaundice.  GI has been consulted as they have been following with [facility] for transition to the liver transplant specialist there, hospitalist called for admission in the interim given her worsening clinical status over the past week. Admitted for further evaluation and treatment, GI consulted given her complicated history.   Patient admitted yesterday with presumed acute decompensated alcoholic hepatitis, severe.  Overnight she improved quite drastically, quicker than expected with supportive care.  Initial labs for infectious process are negative.  Symptoms are likely in the setting of prolonged steroid use with recent cessation, adrenal insufficiency unlikely given patient's improvement with supportive care alone.  Outpatient follow-up as above with PCP and GI for repeat testing labs and imaging as indicated.  Ultimate plan for  discharge to GI specialty at [facility] for transplant consideration, awaiting office visit per [facility] scheduling.  Discharge Diagnoses:  Principal Problem:   Acute hepatic encephalopathy (HCC) Active Problems:   Acute liver failure without hepatic coma   Alcoholic hepatitis   Cirrhosis, alcoholic (HCC)   Anxiety disorder   GERD   Fatigue   Alcohol dependence (HCC)   Macrocytosis without anemia   Acute on chronic alcoholic liver disease (HCC)   Decompensation of cirrhosis of liver (HCC)   History of alcoholic hepatitis   Lethargy  Severe alcoholic hepatitis, acute on chronic, stabilizing Chronic cirrhosis History of alcohol abuse disorder -GI following, appreciate insight and recommendations, ultimate plan last month was to transition patient to [facility] transplant service for further evaluation and treatment, transfer of care to their office is pending at this time. -Hold further steroids per GI, no fever or further symptoms, cultures preliminary negative unlikely infectious etiology -Repeat abdominal ultrasound without ascites, otherwise unremarkable for acute changes from prior -Continue previous medications including diuretics, electrolytes, low-salt diet vitamin K supplementation   Acute hepatic encephalopathy, hyperammonemia, fatigue, lethargy Rule out adrenal insufficiency -Cortisol level incorrectly collected overnight -patient symptoms improving without additional steroids or treatment, unlikely adrenal insufficiency, follow-up outpatient for repeat labs -Improving with supportive care, unlikely adrenal insufficiency versus above   Leukocytosis, reactive  -Likely leukemoid reaction in the setting of steroid use stress and decompensated alcoholic hepatitis -Cultures preliminary negative, afebrile without further symptomatology likely reactive  Anxiety -Follow-up with PCP for anxiolytics  GERD -Continue PPI  Discharge Instructions  Discharge Instructions     Discharge patient    Complete by: As directed    Discharge disposition: 01-Home or Self Care   Discharge patient date: 09/22/2022      Allergies as of 09/22/2022       Reactions   Lipitor [  atorvastatin] Other (See Comments)   myalgias        Medication List     TAKE these medications    bumetanide 1 MG tablet Commonly known as: BUMEX Take 1 tablet (1 mg total) by mouth daily. What changed: how much to take   CALCIUM + D PO Take 1 tablet by mouth daily.   Constulose 10 GM/15ML solution Generic drug: lactulose Take 30 mLs (20 g total) by mouth daily. What changed: Another medication with the same name was removed. Continue taking this medication, and follow the directions you see here.   fexofenadine 180 MG tablet Commonly known as: ALLEGRA Take 180 mg by mouth daily as needed for allergies.   folic acid 1 MG tablet Commonly known as: FOLVITE Take 1 mg by mouth daily.   hydrocortisone 25 MG suppository Commonly known as: ANUSOL-HC Place 1 suppository (25 mg total) rectally at bedtime as needed for hemorrhoids or anal itching. What changed: when to take this   Lumify 0.025 % Soln Generic drug: Brimonidine Tartrate Place 1 drop into both eyes as needed (brighten eyes).   multivitamin capsule Take 1 capsule by mouth daily.   omeprazole 20 MG capsule Commonly known as: PRILOSEC Take 20 mg by mouth daily.   phytonadione 5 MG tablet Commonly known as: VITAMIN K Take 1 tablet by mouth once daily for 14 days.   potassium chloride SA 20 MEQ tablet Commonly known as: KLOR-CON M Take 1 tablet (20 mEq total) by mouth daily.   REFRESH OP Place 1-2 drops into both eyes 4 (four) times daily as needed (dry eye).   Simethicone 250 MG Caps Take 250 mg by mouth 2 (two) times daily as needed (flatulence).   spironolactone 100 MG tablet Commonly known as: ALDACTONE Take 1 tablet (100 mg total) by mouth daily. What changed: how much to take   thiamine 500 MG tablet Take 500 mg by  mouth daily.   vitamin C 1000 MG tablet Take 1,000 mg by mouth daily.   Vitamin D 50 MCG (2000 UT) Caps Take 2,000 Units by mouth 3 (three) times a week. Mon, Wed, Friday        Allergies  Allergen Reactions   Lipitor [Atorvastatin] Other (See Comments)    myalgias    Consultations: GI,    Procedures/Studies: US ABDOMEN LIMITED WITH LIVER DOPPLER  Result Date: 09/21/2022 CLINICAL DATA:  Cirrhosis EXAM: DUPLEX ULTRASOUND OF LIVER TECHNIQUE: Color and duplex Doppler ultrasound was performed to evaluate the hepatic in-flow and out-flow vessels. COMPARISON:  MRI abdomen 09/02/2022 FINDINGS: Liver: Decreased echogenicity.  Lobulated contour. No focal lesion, mass or intrahepatic biliary ductal dilatation. Main Portal Vein size: 6.4 cm cm Portal Vein Velocities Main Prox: Bidirectional Main Mid: Hepatofugal, 15.5 centimeters/second Main Dist: Hepatofugal, 17.9 centimeter/second Right: Hepatofugal Left: Bidirectional, 21.9 centimeter/second Hepatic Vein Velocities Right:  33.3 cm/sec Middle:  45.0 cm/sec Left:  86.5 cm/sec IVC: Present and patent with normal respiratory phasicity. Hepatic Artery Velocity:  159 cm/sec Splenic Vein Velocity:  17.9 cm/sec Spleen: 8.1 cm x 13.2 cm x 6.7 cm with a total volume of 379 cm^3 (411 cm^3 is upper limit normal) Portal Vein Occlusion/Thrombus: No Splenic Vein Occlusion/Thrombus: No Ascites: None Varices: None IMPRESSION: 1. Cirrhotic liver morphology. 2. Patent portal vein with bidirectional hepatofugal flow. Findings are consistent with portal hypertension. 3. No thrombus identified. Electronically Signed   By: Ronney Asters M.D.   On: 09/21/2022 21:26   DG Chest 2 View  Result Date: 09/21/2022 CLINICAL  DATA:  Provided history: Leukocytosis. Additional history provided: Liver failure, increased weakness and fatigue over the weekend, unsteady gait, jaundice. EXAM: CHEST - 2 VIEW COMPARISON:  Prior chest radiographs 09/01/2022 and earlier. FINDINGS:  Heart size within normal limits. Subtle linear opacities within the left lung base consistent with minimal atelectasis. No appreciable airspace consolidation. No evidence of pleural effusion or pneumothorax. Degenerative changes of the spine. IMPRESSION: Minimal atelectasis within the left lung base. Otherwise, no evidence of acute cardiopulmonary abnormality. Electronically Signed   By: Kellie Simmering D.O.   On: 09/21/2022 17:15   [place] ASCITES (ABDOMEN LIMITED)  Result Date: 09/02/2022 CLINICAL DATA:  Ascites EXAM: LIMITED ABDOMEN ULTRASOUND FOR ASCITES TECHNIQUE: Limited ultrasound survey for ascites was performed in all four abdominal quadrants. COMPARISON:  None Available. FINDINGS: Small pocket of fluid is seen in the left lower quadrant. IMPRESSION: Trace ascites in the left lower quadrant Electronically Signed   By: Marin Roberts M.D.   On: 09/02/2022 14:16   ECHOCARDIOGRAM COMPLETE  Result Date: 09/02/2022    ECHOCARDIOGRAM REPORT   Patient Name:   ADRI SCHLOSS Date of Exam: 09/02/2022 Medical Rec #:  [id number]         Height:       61.5 in Accession #:    [id number]        Weight:       194.0 lb Date of Birth:  1971-03-22         BSA:          1.876 m Patient Age:    89 years          BP:           119/66 mmHg Patient Gender: F                 HR:           100 bpm. Exam Location:  Inpatient Procedure: 2D Echo Indications:    fluid overload  History:        Patient has prior history of Echocardiogram examinations, most                 recent 09/27/2013. Risk Factors:Dyslipidemia.  Sonographer:    Harvie Junior Referring Phys: [id number] SARA-MAIZ A THOMAS  Sonographer Comments: Image acquisition challenging due to patient body habitus. IMPRESSIONS  1. Left ventricular ejection fraction, by estimation, is 70 to 75%. The left ventricle has hyperdynamic function. The left ventricle has no regional wall motion abnormalities. Left ventricular diastolic parameters were normal.  2. Right ventricular  systolic function is hyperdynamic. The right ventricular size is normal. Tricuspid regurgitation signal is inadequate for assessing PA pressure.  3. The mitral valve was not well visualized. No evidence of mitral valve regurgitation. No evidence of mitral stenosis.  4. The aortic valve is tricuspid. Aortic valve regurgitation is not visualized. No aortic stenosis is present. Comparison(s): Unable to view prior study. FINDINGS  Left Ventricle: Left ventricular ejection fraction, by estimation, is 70 to 75%. The left ventricle has hyperdynamic function. The left ventricle has no regional wall motion abnormalities. The left ventricular internal cavity size was normal in size. There is no left ventricular hypertrophy. Left ventricular diastolic parameters were normal. Right Ventricle: The right ventricular size is normal. No increase in right ventricular wall thickness. Right ventricular systolic function is hyperdynamic. Tricuspid regurgitation signal is inadequate for assessing PA pressure. Left Atrium: Left atrial size was normal in size. Right Atrium: Right atrial size was normal in size. Pericardium:  There is no evidence of pericardial effusion. Presence of epicardial fat layer. Mitral Valve: The mitral valve was not well visualized. No evidence of mitral valve regurgitation. No evidence of mitral valve stenosis. Tricuspid Valve: The tricuspid valve is not well visualized. Tricuspid valve regurgitation is not demonstrated. No evidence of tricuspid stenosis. Aortic Valve: The aortic valve is tricuspid. Aortic valve regurgitation is not visualized. No aortic stenosis is present. Aortic valve mean gradient measures 6.0 mmHg. Aortic valve peak gradient measures 8.9 mmHg. Aortic valve area, by VTI measures 2.25 cm. Pulmonic Valve: The pulmonic valve was normal in structure. Pulmonic valve regurgitation is not visualized. No evidence of pulmonic stenosis. Aorta: The aortic root and ascending aorta are structurally  normal, with no evidence of dilitation. IAS/Shunts: No atrial level shunt detected by color flow Doppler.  LEFT VENTRICLE PLAX 2D LVIDd:         4.60 cm      Diastology LVIDs:         3.20 cm      LV e' medial:    9.36 cm/s LV PW:         0.80 cm      LV E/e' medial:  11.2 LV IVS:        0.80 cm      LV e' lateral:   10.10 cm/s LVOT diam:     1.70 cm      LV E/e' lateral: 10.4 LV SV:         66 LV SV Index:   35 LVOT Area:     2.27 cm  LV Volumes (MOD) LV vol d, MOD A2C: 71.5 ml LV vol d, MOD A4C: 111.0 ml LV vol s, MOD A2C: 32.0 ml LV vol s, MOD A4C: 48.2 ml LV SV MOD A2C:     39.5 ml LV SV MOD A4C:     111.0 ml LV SV MOD BP:      51.8 ml RIGHT VENTRICLE RV Basal diam:  3.10 cm RV Mid diam:    2.50 cm RV S prime:     18.50 cm/s TAPSE (M-mode): 2.4 cm LEFT ATRIUM             Index        RIGHT ATRIUM           Index LA diam:        3.30 cm 1.76 cm/m   RA Area:     10.20 cm LA Vol (A2C):   33.9 ml 18.07 ml/m  RA Volume:   19.40 ml  10.34 ml/m LA Vol (A4C):   66.7 ml 35.56 ml/m LA Biplane Vol: 51.1 ml 27.24 ml/m  AORTIC VALVE                     PULMONIC VALVE AV Area (Vmax):    2.25 cm      PV Vmax:       1.07 m/s AV Area (Vmean):   2.25 cm      PV Peak grad:  4.6 mmHg AV Area (VTI):     2.25 cm AV Vmax:           149.00 cm/s AV Vmean:          113.000 cm/s AV VTI:            0.292 m AV Peak Grad:      8.9 mmHg AV Mean Grad:      6.0 mmHg LVOT Vmax:  148.00 cm/s LVOT Vmean:        112.000 cm/s LVOT VTI:          0.289 m LVOT/AV VTI ratio: 0.99  AORTA Ao Root diam: 3.00 cm Ao Asc diam:  2.70 cm MITRAL VALVE MV Area (PHT): 3.31 cm     SHUNTS MV Decel Time: 229 msec     Systemic VTI:  0.29 m MR Peak grad: 21.3 mmHg     Systemic Diam: 1.70 cm MR Vmax:      231.00 cm/s MV E velocity: 105.00 cm/s MV A velocity: 115.00 cm/s MV E/A ratio:  0.91 Rudean Haskell MD Electronically signed by Rudean Haskell MD Signature Date/Time: 09/02/2022/10:50:04 AM    Final    MR ABDOMEN MRCP W WO  CONTAST  Result Date: 09/02/2022 CLINICAL DATA:  Jaundice history of alcohol associated hepatitis. EXAM: MRI ABDOMEN WITHOUT AND WITH CONTRAST (INCLUDING MRCP) TECHNIQUE: Multiplanar multisequence MR imaging of the abdomen was performed both before and after the administration of intravenous contrast. Heavily T2-weighted images of the biliary and pancreatic ducts were obtained, and three-dimensional MRCP images were rendered by post processing. CONTRAST:  8m GADAVIST GADOBUTROL 1 MMOL/ML IV SOLN COMPARISON:  Multiple priors including CT abdomen and pelvis September 01, 2022. FINDINGS: Lower chest: No acute abnormality. Hepatobiliary: No significant hepatic steatosis. Cirrhotic hepatic morphology. No suspicious hepatic lesion. Gallbladder is decompressed. Gallbladder wall thickening with pericholecystic fluid. Pancreas: No pancreatic ductal dilation. Edematous appearance of the pancreas with peripancreatic fluid. Homogeneous postcontrast enhancement of the pancreas. Spleen: Mild splenomegaly measuring 14.1 cm in maximum axial dimension. Adrenals/Urinary Tract: Bilateral adrenal glands appear normal. No hydronephrosis. No solid enhancing renal mass. Stomach/Bowel: Mild wall thickening of the proximal duodenum as well as the ascending colon through the hepatic flexure. Vascular/Lymphatic: Normal caliber abdominal aorta. Smooth IVC contours. The portal, splenic and superior mesenteric veins are patent. Abdominal portosystemic collateral vessels including esophageal varices, gastric varices and recannulized paraumbilical vein. Prominent upper abdominal lymph nodes for instance a periportal lymph node measuring 9 mm. Other:  Trace abdominal free fluid. Musculoskeletal: No suspicious bone lesions identified. IMPRESSION: 1. Edematous appearance of the pancreas with peripancreatic fluid. Homogeneous postcontrast enhancement of the pancreas. Findings are favored to reflect acute interstitial pancreatitis. Recommend  correlation with lipase. 2. Cirrhotic hepatic morphology with evidence of portal venous hypertension including portosystemic collateral vessels, trace ascites, mild splenomegaly and portal enterocolopathy. 3. No suspicious hepatic lesion identified. Electronically Signed   By: JDahlia BailiffM.D.   On: 09/02/2022 08:50   MR 3D Recon At Scanner  Result Date: 09/02/2022 CLINICAL DATA:  Jaundice history of alcohol associated hepatitis. EXAM: MRI ABDOMEN WITHOUT AND WITH CONTRAST (INCLUDING MRCP) TECHNIQUE: Multiplanar multisequence MR imaging of the abdomen was performed both before and after the administration of intravenous contrast. Heavily T2-weighted images of the biliary and pancreatic ducts were obtained, and three-dimensional MRCP images were rendered by post processing. CONTRAST:  959mGADAVIST GADOBUTROL 1 MMOL/ML IV SOLN COMPARISON:  Multiple priors including CT abdomen and pelvis September 01, 2022. FINDINGS: Lower chest: No acute abnormality. Hepatobiliary: No significant hepatic steatosis. Cirrhotic hepatic morphology. No suspicious hepatic lesion. Gallbladder is decompressed. Gallbladder wall thickening with pericholecystic fluid. Pancreas: No pancreatic ductal dilation. Edematous appearance of the pancreas with peripancreatic fluid. Homogeneous postcontrast enhancement of the pancreas. Spleen: Mild splenomegaly measuring 14.1 cm in maximum axial dimension. Adrenals/Urinary Tract: Bilateral adrenal glands appear normal. No hydronephrosis. No solid enhancing renal mass. Stomach/Bowel: Mild wall thickening of the proximal duodenum as well as the  ascending colon through the hepatic flexure. Vascular/Lymphatic: Normal caliber abdominal aorta. Smooth IVC contours. The portal, splenic and superior mesenteric veins are patent. Abdominal portosystemic collateral vessels including esophageal varices, gastric varices and recannulized paraumbilical vein. Prominent upper abdominal lymph nodes for instance a  periportal lymph node measuring 9 mm. Other:  Trace abdominal free fluid. Musculoskeletal: No suspicious bone lesions identified. IMPRESSION: 1. Edematous appearance of the pancreas with peripancreatic fluid. Homogeneous postcontrast enhancement of the pancreas. Findings are favored to reflect acute interstitial pancreatitis. Recommend correlation with lipase. 2. Cirrhotic hepatic morphology with evidence of portal venous hypertension including portosystemic collateral vessels, trace ascites, mild splenomegaly and portal enterocolopathy. 3. No suspicious hepatic lesion identified. Electronically Signed   By: Dahlia Bailiff M.D.   On: 09/02/2022 08:50   DG Chest Portable 1 View  Result Date: 09/01/2022 CLINICAL DATA:  Pulmonary edema and leg swelling. EXAM: PORTABLE CHEST 1 VIEW COMPARISON:  05/05/2022. FINDINGS: The heart size and mediastinal contours are within normal limits. No consolidation, effusion, or pneumothorax. No acute osseous abnormality. IMPRESSION: No active disease. Electronically Signed   By: Brett Fairy M.D.   On: 09/01/2022 21:09   CT ABDOMEN PELVIS W CONTRAST  Result Date: 09/01/2022 CLINICAL DATA:  Epigastric pain. Pt states she is in liver failure. States she had labs drawn this morning and was told to come to the hospital for admission. Pt reports nose bleeds, bleeding hemorrhoids, yellow skin. EXAM: CT ABDOMEN AND PELVIS WITH CONTRAST TECHNIQUE: Multidetector CT imaging of the abdomen and pelvis was performed using the standard protocol following bolus administration of intravenous contrast. RADIATION DOSE REDUCTION: This exam was performed according to the departmental dose-optimization program which includes automated exposure control, adjustment of the mA and/or kV according to patient size and/or use of iterative reconstruction technique. CONTRAST:  116m OMNIPAQUE IOHEXOL 300 MG/ML  SOLN COMPARISON:  Ultrasound abdomen 09/17/2021 FINDINGS: Lower chest: No acute abnormality.  Hepatobiliary: Nodular hepatic contour. Enlarged left hepatic lobe. The liver is enlarged measuring up to 20 cm. No focal liver abnormality. Contracted gallbladder. No gallstones, gallbladder wall thickening, or pericholecystic fluid. No biliary dilatation. Pancreas: No focal lesion. Normal pancreatic contour. No surrounding inflammatory changes. No main pancreatic ductal dilatation. Spleen: Normal in size without focal abnormality. Adrenals/Urinary Tract: No adrenal nodule bilaterally. Bilateral kidneys enhance symmetrically. No hydronephrosis. No hydroureter. The urinary bladder is unremarkable. On delayed imaging, there is no urothelial wall thickening and there are no filling defects in the opacified portions of the bilateral collecting systems or ureters. Stomach/Bowel: Stomach is within normal limits. No evidence of bowel wall thickening or dilatation. Appendix appears normal. Vascular/Lymphatic: Recanalized paraumbilical vein. Paraesophageal, perigastric, and abdominopelvic venous collaterals are noted. The main portal, splenic, superior mesenteric veins are patent. No abdominal aorta or iliac aneurysm. Mild atherosclerotic plaque of the aorta and its branches. No abdominal, pelvic, or inguinal lymphadenopathy. Reproductive: Uterus and bilateral adnexa are unremarkable. Other: Trace to small volume simple free fluid within the upper abdomen and within the pelvis. No intraperitoneal free gas. No organized fluid collection. Musculoskeletal: Subcutaneus soft tissue edema. No suspicious lytic or blastic osseous lesions. No acute displaced fracture. Multilevel degenerative changes of the spine. IMPRESSION: 1. Cirrhosis with portal hypertension. No focal liver lesions identified. Please note that liver protocol enhanced MR and CT are the most sensitive tests for the screening detection of hepatocellular carcinoma in the high risk setting of cirrhosis. 2. Trace to small volume simple free fluid ascites. 3.  Aortic  Atherosclerosis (ICD10-I70.0). Electronically Signed  By: Iven Finn M.D.   On: 09/01/2022 20:10   US Abdomen Complete  Result Date: 08/25/2022 CLINICAL DATA:  Epigastric pain.  Alcoholic cirrhosis. EXAM: ABDOMEN ULTRASOUND COMPLETE COMPARISON:  Ultrasound abdomen 09/17/2021 FINDINGS: Gallbladder: Gallbladder is contracted with mild wall thickening measuring up to 6 mm. No cholelithiasis. Negative sonographic Murphy's sign. No pericholecystic fluid. Common bile duct: Diameter: 3 mm Liver: Coarsened echogenicity and nodular contour. No focal lesion. Mixed flow is demonstrated within the portal vein, which is poorly evaluated. IVC: No abnormality visualized. Pancreas: Not well visualized due to overlying bowel gas. Spleen: Size and appearance within normal limits. Right Kidney: Length: 14 cm. Echogenicity within normal limits. No mass or hydronephrosis visualized. Left Kidney: Length: 10 cm. Echogenicity within normal limits. No mass or hydronephrosis visualized. Abdominal aorta: No aneurysm visualized. Other findings: None. IMPRESSION: 1. Cirrhotic morphology of the liver. No focal hepatic lesion. 2. Gallbladder is contracted with mild wall thickening, nonspecific, potentially secondary to cirrhosis. Consider clinical and laboratory correlation. No cholelithiasis. 3. Mixed directional flow demonstrated within the portal vein, which is poorly evaluated. Electronically Signed   By: Lovey Newcomer M.D.   On: 08/25/2022 12:34     Subjective: No acute issues or events overnight denies nausea vomiting diarrhea constipation headache fevers chills or chest pain   Discharge Exam: Vitals:   09/21/22 1948 09/22/22 0646  BP: (!) 107/57 (!) 93/54  Pulse: 98 95  Resp: 20 17  Temp: 98.3 F (36.8 C) 98.3 F (36.8 C)  SpO2: 98% 97%   Vitals:   09/21/22 1754 09/21/22 1802 09/21/22 1948 09/22/22 0646  BP: (!) 98/57 (!) 92/57 (!) 107/57 (!) 93/54  Pulse: 86 92 98 95  Resp: 16 (!) '23 20 17  '$ Temp:   98.3 F  (36.8 C) 98.3 F (36.8 C)  TempSrc:   Oral Oral  SpO2: 99% 99% 98% 97%  Weight:      Height:        General: Pt is alert, awake, not in acute distress, markedly icteric Cardiovascular: RRR, S1/S2 +, no rubs, no gallops Respiratory: CTA bilaterally, no wheezing, no rhonchi Abdominal: Soft, NT, ND, bowel sounds + without overt fluid wave shift Extremities: no edema, no cyanosis    The results of significant diagnostics from this hospitalization (including imaging, microbiology, ancillary and laboratory) are listed below for reference.     Microbiology: No results found for this or any previous visit (from the past 240 hour(s)).   Labs: BNP (last 3 results) Recent Labs    09/02/22 1331  BNP 761.9*   Basic Metabolic Panel: Recent Labs  Lab 09/16/22 1136 09/18/22 1143 09/21/22 1150 09/22/22 0356  NA 126* 127* 127* 127*  K 4.2 4.5 4.9 4.9  CL 89* 91* 95* 96*  CO2 27 23 19* 20*  GLUCOSE 150* 185* 120* 111*  BUN 24* 27* 27* 28*  CREATININE 0.95 0.92 0.75 0.70  CALCIUM 8.8 9.2 9.1 8.8*   Liver Function Tests: Recent Labs  Lab 09/16/22 1136 09/18/22 1143 09/21/22 1150 09/22/22 0356  AST 130* 161* 134* 135*  ALT 90* 87* 70* 58*  ALKPHOS 227* 248* 240* 212*  BILITOT 21.0* 23.6* 25.8* 22.8*  PROT 6.1 6.3 6.4* 5.4*  ALBUMIN 3.4* 3.3* 2.3* 2.0*   Recent Labs  Lab 09/21/22 1150  LIPASE 59*   Recent Labs  Lab 09/21/22 1436 09/22/22 0356  AMMONIA 42* 63*   CBC: Recent Labs  Lab 09/16/22 1136 09/18/22 1143 09/21/22 1150 09/21/22 1835  WBC 17.2* 18.7  Repeated and verified X2.* 19.4* 18.6*  NEUTROABS  --  14.8* 16.7* 15.6*  HGB 13.0 13.0 13.1 12.6  HCT 37.5 37.6 36.8 35.9*  MCV 105.4* 104.0* 101.1* 101.7*  PLT 207.0 206.0 174 164   Cardiac Enzymes: No results for input(s): "CKTOTAL", "CKMB", "CKMBINDEX", "TROPONINI" in the last 168 hours. BNP: Invalid input(s): "POCBNP" CBG: No results for input(s): "GLUCAP" in the last 168 hours. D-Dimer No  results for input(s): "DDIMER" in the last 72 hours. Hgb A1c No results for input(s): "HGBA1C" in the last 72 hours. Lipid Profile No results for input(s): "CHOL", "HDL", "LDLCALC", "TRIG", "CHOLHDL", "LDLDIRECT" in the last 72 hours. Thyroid function studies No results for input(s): "TSH", "T4TOTAL", "T3FREE", "THYROIDAB" in the last 72 hours.  Invalid input(s): "FREET3" Anemia work up No results for input(s): "VITAMINB12", "FOLATE", "FERRITIN", "TIBC", "IRON", "RETICCTPCT" in the last 72 hours. Urinalysis    Component Value Date/Time   COLORURINE AMBER (A) 09/21/2022 1822   APPEARANCEUR CLEAR 09/21/2022 1822   LABSPEC 1.015 09/21/2022 1822   PHURINE 5.0 09/21/2022 1822   GLUCOSEU 50 (A) 09/21/2022 1822   HGBUR NEGATIVE 09/21/2022 1822   BILIRUBINUR MODERATE (A) 09/21/2022 1822   BILIRUBINUR n 08/19/2016 1658   KETONESUR NEGATIVE 09/21/2022 1822   PROTEINUR NEGATIVE 09/21/2022 1822   UROBILINOGEN 0.2 07/15/2020 1618   NITRITE NEGATIVE 09/21/2022 1822   LEUKOCYTESUR NEGATIVE 09/21/2022 1822   Sepsis Labs Recent Labs  Lab 09/16/22 1136 09/18/22 1143 09/21/22 1150 09/21/22 1835  WBC 17.2* 18.7 Repeated and verified X2.* 19.4* 18.6*   Microbiology No results found for this or any previous visit (from the past 240 hour(s)).   Time coordinating discharge: Over 30 minutes  SIGNED:   Little Ishikawa, DO [facility] 09/22/2022, 11:45 AM Pager   If 7PM-7AM, please contact night-coverage [email or web address]

## 2022-09-22 NOTE — Telephone Encounter (Signed)
Patient's husband is calling regarding [employer] claims. Please advise

## 2022-09-22 NOTE — Progress Notes (Cosign Needed Addendum)
Patient ID: Jamie Coleman, female   DOB: 06-Aug-1971, 51 y.o.   MRN: [id number]    Progress Note   Subjective   Day # 2  CC; persistent severe alcoholic hepatitis, cirrhosis, lethargy/fatigue  Labs-sodium 127/potassium 4.9/BUN 28/creatinine 0.7 T. bili 22.8/alk phos 212/AST 135/ALT 58 Ammonia 63  A.m. cortisol was collected last p.m. in error-21  Chest x-ray minimal atelectasis left lung base otherwise unremarkable Doppler ultrasound-cirrhotic hepatic morphology patent portal vein with bidirectional hepatofugal flow no thrombus no comment regarding ascites  I discussed with ultrasound, no ascites was seen at the time of above ultrasound  She says she is feeling a little bit better today, not as fatigued as yesterday No other new symptoms   Vital signs in last 24 hours: Temp:  [98 F (36.7 C)-98.3 F (36.8 C)] 98.3 F (36.8 C) (12/12 0646) Pulse Rate:  [85-101] 95 (12/12 0646) Resp:  [16-27] 17 (12/12 0646) BP: (92-109)/(54-62) 93/54 (12/12 0646) SpO2:  [97 %-100 %] 97 % (12/12 0646) Weight:  [76.7 kg] 76.7 kg (12/11 1145) Last BM Date : 09/21/22 General:   Older white female in NAD-deeply jaundiced, husband at bedside Heart:  Regular rate and rhythm; no murmurs Lungs: Respirations even and unlabored, lungs CTA bilaterally Abdomen:  Soft, nontender and nondistended. Normal bowel sounds. Extremities:  Without edema. Neurologic:  Alert and oriented,  grossly normal neurologically. Psych:  Cooperative. Normal mood and affect.  Intake/Output from previous day: No intake/output data recorded. Intake/Output this shift: No intake/output data recorded.  Lab Results: Recent Labs    09/21/22 1150 09/21/22 1835  WBC 19.4* 18.6*  HGB 13.1 12.6  HCT 36.8 35.9*  PLT 174 164   BMET Recent Labs    09/21/22 1150 09/22/22 0356  NA 127* 127*  K 4.9 4.9  CL 95* 96*  CO2 19* 20*  GLUCOSE 120* 111*  BUN 27* 28*  CREATININE 0.75 0.70  CALCIUM 9.1 8.8*   LFT Recent  Labs    09/21/22 1835 09/22/22 0356  PROT  --  5.4*  ALBUMIN  --  2.0*  AST  --  135*  ALT  --  58*  ALKPHOS  --  212*  BILITOT  --  22.8*  BILIDIR 18.1*  --    PT/INR Recent Labs    09/21/22 1150 09/22/22 0356  LABPROT 21.5* 21.6*  INR 1.9* 1.9*    Studies/Results: US ABDOMEN LIMITED WITH LIVER DOPPLER  Result Date: 09/21/2022 CLINICAL DATA:  Cirrhosis EXAM: DUPLEX ULTRASOUND OF LIVER TECHNIQUE: Color and duplex Doppler ultrasound was performed to evaluate the hepatic in-flow and out-flow vessels. COMPARISON:  MRI abdomen 09/02/2022 FINDINGS: Liver: Decreased echogenicity.  Lobulated contour. No focal lesion, mass or intrahepatic biliary ductal dilatation. Main Portal Vein size: 6.4 cm cm Portal Vein Velocities Main Prox: Bidirectional Main Mid: Hepatofugal, 15.5 centimeters/second Main Dist: Hepatofugal, 17.9 centimeter/second Right: Hepatofugal Left: Bidirectional, 21.9 centimeter/second Hepatic Vein Velocities Right:  33.3 cm/sec Middle:  45.0 cm/sec Left:  86.5 cm/sec IVC: Present and patent with normal respiratory phasicity. Hepatic Artery Velocity:  159 cm/sec Splenic Vein Velocity:  17.9 cm/sec Spleen: 8.1 cm x 13.2 cm x 6.7 cm with a total volume of 379 cm^3 (411 cm^3 is upper limit normal) Portal Vein Occlusion/Thrombus: No Splenic Vein Occlusion/Thrombus: No Ascites: None Varices: None IMPRESSION: 1. Cirrhotic liver morphology. 2. Patent portal vein with bidirectional hepatofugal flow. Findings are consistent with portal hypertension. 3. No thrombus identified. Electronically Signed   By: Ronney Asters M.D.   On: 09/21/2022  21:26   DG Chest 2 View  Result Date: 09/21/2022 CLINICAL DATA:  Provided history: Leukocytosis. Additional history provided: Liver failure, increased weakness and fatigue over the weekend, unsteady gait, jaundice. EXAM: CHEST - 2 VIEW COMPARISON:  Prior chest radiographs 09/01/2022 and earlier. FINDINGS: Heart size within normal limits. Subtle linear  opacities within the left lung base consistent with minimal atelectasis. No appreciable airspace consolidation. No evidence of pleural effusion or pneumothorax. Degenerative changes of the spine. IMPRESSION: Minimal atelectasis within the left lung base. Otherwise, no evidence of acute cardiopulmonary abnormality. Electronically Signed   By: Kellie Simmering D.O.   On: 09/21/2022 17:15       Assessment / Plan:    #17 51 year old white female with severe alcoholic hepatitis, who has completed a 30-day course of prednisolone as of 09/17/2022.  Presented yesterday with 3-day history of progressive fatigue and lethargy No overt signs of hepatic encephalopathy and ammonia 42 on admission  Infectious evaluation negative at present with negative chest x-ray, negative ultrasound/no ascites, negative UA, blood culture pending  And a.m. cortisol not helpful as was drawn last p.m.  She  is mentating well today and feels a bit better-suspect some of the lethargy and fatigue have been secondary to component of adrenal insufficiency after 30-day course of prednisolone-steroids likely also masking some of her fatigue joint aches etc.  Hepatic parameters are stable today  M ELD= 25 MELD-Na= 30 M ELD 3.0= 31  Plan; Patient is stable for discharge to home today And are awaiting call from [place] for initial transplant hepatology visit.  Hopefully this will occur within the next 2 weeks  Continue 2 g sodium diet Alcohol abstinence forever Patient encouraged to continue with AA meetings and close follow-up with substance abuse counselor  Will continue Bumex at 0.5 mg daily, Aldactone 25 mg twice daily Decrease K-Dur 20 equivalents every other day Continue Lactulose 20-30 cc daily  Continue vitamin K 5 mg orally daily, will need new prescription Patient had mentioned that substance abuse counselor had suggested very dose low-dose Lexapro-discussed that this morning, as Lexapro is extensively metabolized via the  liver, she is advised to avoid Lexapro at this point as unlikely to offer any real benefit in her situation Continue folic acid, Multivite daily thiamine  She was asked to get refills for Xanax which she has been using as needed nightly from her PCP  We will arrange for outpatient labs at our office on Friday, 09/25/2022             Principal Problem:   Acute hepatic encephalopathy ([place]) Active Problems:   Anxiety disorder   GERD   Fatigue   Alcoholic hepatitis   Alcohol dependence ([place])   Acute liver failure without hepatic coma   Cirrhosis, alcoholic ([place])   Macrocytosis without anemia   Acute on chronic alcoholic liver disease ([place])   Decompensation of cirrhosis of liver ([place])   History of alcoholic hepatitis   Lethargy     LOS: 1 day   Olar Santini PA-C 09/22/2022, 8:50 AM

## 2022-09-22 NOTE — Telephone Encounter (Signed)
Jamie Coleman, Please find out if our team needs to reach out or if the patient needs to reach out.  I can give her this information to call today.  She remains hospitalized today and we will need to see if she will remain hospitalized or be able to be discharged in the next few days.  Thanks. GM

## 2022-09-22 NOTE — Plan of Care (Signed)
Discharge instructions reviewed with patient and spouse, questions answered, verbalized understanding.  Patient transported via wheelchair to main entrance to be taken home by spouse.

## 2022-09-23 ENCOUNTER — Other Ambulatory Visit: Payer: Self-pay | Admitting: Obstetrics and Gynecology

## 2022-09-23 ENCOUNTER — Encounter: Payer: Self-pay | Admitting: Gastroenterology

## 2022-09-23 DIAGNOSIS — N6310 Unspecified lump in the right breast, unspecified quadrant: Secondary | ICD-10-CM

## 2022-09-23 DIAGNOSIS — E871 Hypo-osmolality and hyponatremia: Secondary | ICD-10-CM

## 2022-09-23 NOTE — Telephone Encounter (Signed)
Kandy from [facility] returned your call. She said that the patient has been authorized from insurance for full transplant evaluation. You can call her back at [phone]    Left message on machine to call back

## 2022-09-24 ENCOUNTER — Telehealth: Payer: Self-pay | Admitting: Psychiatry

## 2022-09-24 ENCOUNTER — Telehealth: Payer: Self-pay | Admitting: *Deleted

## 2022-09-24 ENCOUNTER — Telehealth: Payer: Self-pay | Admitting: Nurse Practitioner

## 2022-09-24 DIAGNOSIS — K7030 Alcoholic cirrhosis of liver without ascites: Secondary | ICD-10-CM

## 2022-09-24 DIAGNOSIS — E2839 Other primary ovarian failure: Secondary | ICD-10-CM | POA: Insufficient documentation

## 2022-09-24 NOTE — Telephone Encounter (Signed)
[facility] or [employer]?  Let me know and I will order it  I cannot guarantee ins will cover so she may need to check but I would like her to get one

## 2022-09-24 NOTE — Telephone Encounter (Signed)
Pt has also sent a mychart regarding this. See mychart message

## 2022-09-24 NOTE — Telephone Encounter (Signed)
Candy from [facility] is calling wishing to speak with Patty. Call back # [phone]

## 2022-09-24 NOTE — Telephone Encounter (Signed)
Please work patient in with me for a pap next week.

## 2022-09-24 NOTE — Telephone Encounter (Signed)
Pt stated that her husband had questions. Pt stated that she would give him the message to call back

## 2022-09-24 NOTE — Telephone Encounter (Signed)
Pt notified of Dr. Marliss Coots comments. Pt said she can go to either city. Pt just ask regarding insurance to make sure they are aware it's for a liver transplant work up when ordering it/using diagnosis codes. Pt said she has new insurance and as long as they accept [place] she can go to either city

## 2022-09-24 NOTE — Telephone Encounter (Signed)
Ordered it for elam  Please call the location of your choice from the menu below to schedule your Mammogram and/or Bone Density appointment.      [facility]                 Phone: [phone] [address], [place] [address]    Service: Bone Density ONLY   *this site does NOT perform mammograms   Se can call to schedule  Thanks

## 2022-09-24 NOTE — Telephone Encounter (Signed)
Received a My Chart message from the pt and she has been scheduled for an appt with [facility].

## 2022-09-24 NOTE — Telephone Encounter (Signed)
Inbound call from patients husband requesting a call back to discuss questions he has about his wife. Please advise.

## 2022-09-24 NOTE — Telephone Encounter (Signed)
Patient husband Marcello Moores) called stating patient needs to have recent pap smear ASAP. Patient is currently on the liver transplant list and one of the requirements is a recent pap smear. Last annual exam 07/22/22. Patient husband Marcello Moores said if it could be done this month that would be great. Detailed notes are in epic.   Please advise

## 2022-09-24 NOTE — Telephone Encounter (Signed)
Inbound call from patient returning a call back at [phone]. Please advise.

## 2022-09-24 NOTE — Telephone Encounter (Signed)
Patient husband called and stated can patient get a bone density scan, Call back number [phone].

## 2022-09-25 ENCOUNTER — Other Ambulatory Visit (INDEPENDENT_AMBULATORY_CARE_PROVIDER_SITE_OTHER): Payer: 59

## 2022-09-25 ENCOUNTER — Telehealth: Payer: Self-pay | Admitting: Nurse Practitioner

## 2022-09-25 DIAGNOSIS — K760 Fatty (change of) liver, not elsewhere classified: Secondary | ICD-10-CM | POA: Diagnosis not present

## 2022-09-25 DIAGNOSIS — F411 Generalized anxiety disorder: Secondary | ICD-10-CM | POA: Diagnosis not present

## 2022-09-25 DIAGNOSIS — K7030 Alcoholic cirrhosis of liver without ascites: Secondary | ICD-10-CM | POA: Diagnosis not present

## 2022-09-25 DIAGNOSIS — D72829 Elevated white blood cell count, unspecified: Secondary | ICD-10-CM | POA: Diagnosis not present

## 2022-09-25 DIAGNOSIS — D62 Acute posthemorrhagic anemia: Secondary | ICD-10-CM | POA: Diagnosis not present

## 2022-09-25 DIAGNOSIS — Z01810 Encounter for preprocedural cardiovascular examination: Secondary | ICD-10-CM | POA: Diagnosis not present

## 2022-09-25 DIAGNOSIS — Z9889 Other specified postprocedural states: Secondary | ICD-10-CM | POA: Diagnosis not present

## 2022-09-25 DIAGNOSIS — G9341 Metabolic encephalopathy: Secondary | ICD-10-CM | POA: Diagnosis not present

## 2022-09-25 DIAGNOSIS — Z01818 Encounter for other preprocedural examination: Secondary | ICD-10-CM | POA: Diagnosis not present

## 2022-09-25 DIAGNOSIS — Z4659 Encounter for fitting and adjustment of other gastrointestinal appliance and device: Secondary | ICD-10-CM | POA: Diagnosis not present

## 2022-09-25 DIAGNOSIS — T380X5A Adverse effect of glucocorticoids and synthetic analogues, initial encounter: Secondary | ICD-10-CM | POA: Diagnosis not present

## 2022-09-25 DIAGNOSIS — D649 Anemia, unspecified: Secondary | ICD-10-CM | POA: Diagnosis not present

## 2022-09-25 DIAGNOSIS — Z944 Liver transplant status: Secondary | ICD-10-CM | POA: Diagnosis not present

## 2022-09-25 DIAGNOSIS — K7031 Alcoholic cirrhosis of liver with ascites: Secondary | ICD-10-CM | POA: Diagnosis not present

## 2022-09-25 DIAGNOSIS — K7010 Alcoholic hepatitis without ascites: Secondary | ICD-10-CM

## 2022-09-25 DIAGNOSIS — D696 Thrombocytopenia, unspecified: Secondary | ICD-10-CM | POA: Diagnosis not present

## 2022-09-25 DIAGNOSIS — R109 Unspecified abdominal pain: Secondary | ICD-10-CM | POA: Diagnosis not present

## 2022-09-25 DIAGNOSIS — K7689 Other specified diseases of liver: Secondary | ICD-10-CM | POA: Diagnosis not present

## 2022-09-25 DIAGNOSIS — D849 Immunodeficiency, unspecified: Secondary | ICD-10-CM | POA: Diagnosis not present

## 2022-09-25 DIAGNOSIS — R0602 Shortness of breath: Secondary | ICD-10-CM | POA: Diagnosis not present

## 2022-09-25 DIAGNOSIS — I959 Hypotension, unspecified: Secondary | ICD-10-CM | POA: Diagnosis not present

## 2022-09-25 DIAGNOSIS — Z792 Long term (current) use of antibiotics: Secondary | ICD-10-CM | POA: Diagnosis not present

## 2022-09-25 DIAGNOSIS — K1329 Other disturbances of oral epithelium, including tongue: Secondary | ICD-10-CM | POA: Diagnosis not present

## 2022-09-25 DIAGNOSIS — K219 Gastro-esophageal reflux disease without esophagitis: Secondary | ICD-10-CM | POA: Diagnosis not present

## 2022-09-25 DIAGNOSIS — K2100 Gastro-esophageal reflux disease with esophagitis, without bleeding: Secondary | ICD-10-CM | POA: Diagnosis not present

## 2022-09-25 DIAGNOSIS — Z515 Encounter for palliative care: Secondary | ICD-10-CM | POA: Diagnosis not present

## 2022-09-25 DIAGNOSIS — D689 Coagulation defect, unspecified: Secondary | ICD-10-CM | POA: Diagnosis not present

## 2022-09-25 DIAGNOSIS — Z452 Encounter for adjustment and management of vascular access device: Secondary | ICD-10-CM | POA: Diagnosis not present

## 2022-09-25 DIAGNOSIS — K7460 Unspecified cirrhosis of liver: Secondary | ICD-10-CM | POA: Diagnosis not present

## 2022-09-25 DIAGNOSIS — J9601 Acute respiratory failure with hypoxia: Secondary | ICD-10-CM | POA: Diagnosis not present

## 2022-09-25 DIAGNOSIS — K766 Portal hypertension: Secondary | ICD-10-CM | POA: Diagnosis not present

## 2022-09-25 DIAGNOSIS — D735 Infarction of spleen: Secondary | ICD-10-CM | POA: Diagnosis not present

## 2022-09-25 DIAGNOSIS — F1021 Alcohol dependence, in remission: Secondary | ICD-10-CM | POA: Diagnosis not present

## 2022-09-25 DIAGNOSIS — R918 Other nonspecific abnormal finding of lung field: Secondary | ICD-10-CM | POA: Diagnosis not present

## 2022-09-25 DIAGNOSIS — R188 Other ascites: Secondary | ICD-10-CM | POA: Diagnosis not present

## 2022-09-25 DIAGNOSIS — N179 Acute kidney failure, unspecified: Secondary | ICD-10-CM | POA: Diagnosis not present

## 2022-09-25 DIAGNOSIS — Z7189 Other specified counseling: Secondary | ICD-10-CM | POA: Diagnosis not present

## 2022-09-25 DIAGNOSIS — K7290 Hepatic failure, unspecified without coma: Secondary | ICD-10-CM | POA: Diagnosis not present

## 2022-09-25 DIAGNOSIS — E871 Hypo-osmolality and hyponatremia: Secondary | ICD-10-CM | POA: Diagnosis not present

## 2022-09-25 DIAGNOSIS — R739 Hyperglycemia, unspecified: Secondary | ICD-10-CM | POA: Diagnosis not present

## 2022-09-25 DIAGNOSIS — R92333 Mammographic heterogeneous density, bilateral breasts: Secondary | ICD-10-CM | POA: Diagnosis not present

## 2022-09-25 DIAGNOSIS — R161 Splenomegaly, not elsewhere classified: Secondary | ICD-10-CM | POA: Diagnosis not present

## 2022-09-25 DIAGNOSIS — F1014 Alcohol abuse with alcohol-induced mood disorder: Secondary | ICD-10-CM | POA: Diagnosis not present

## 2022-09-25 DIAGNOSIS — K8010 Calculus of gallbladder with chronic cholecystitis without obstruction: Secondary | ICD-10-CM | POA: Diagnosis not present

## 2022-09-25 DIAGNOSIS — F419 Anxiety disorder, unspecified: Secondary | ICD-10-CM | POA: Diagnosis not present

## 2022-09-25 DIAGNOSIS — Z7682 Awaiting organ transplant status: Secondary | ICD-10-CM | POA: Diagnosis not present

## 2022-09-25 LAB — CBC WITH DIFFERENTIAL/PLATELET
Basophils Absolute: 0.1 10*3/uL (ref 0.0–0.1)
Basophils Relative: 0.5 % (ref 0.0–3.0)
Eosinophils Absolute: 0.2 10*3/uL (ref 0.0–0.7)
Eosinophils Relative: 1 % (ref 0.0–5.0)
HCT: 36.1 % (ref 36.0–46.0)
Hemoglobin: 12.5 g/dL (ref 12.0–15.0)
Lymphocytes Relative: 9.4 % — ABNORMAL LOW (ref 12.0–46.0)
Lymphs Abs: 1.9 10*3/uL (ref 0.7–4.0)
MCHC: 34.8 g/dL (ref 30.0–36.0)
MCV: 103.7 fl — ABNORMAL HIGH (ref 78.0–100.0)
Monocytes Absolute: 1.3 10*3/uL — ABNORMAL HIGH (ref 0.1–1.0)
Monocytes Relative: 6.2 % (ref 3.0–12.0)
Neutro Abs: 16.8 10*3/uL — ABNORMAL HIGH (ref 1.4–7.7)
Neutrophils Relative %: 82.9 % — ABNORMAL HIGH (ref 43.0–77.0)
Platelets: 194 10*3/uL (ref 150.0–400.0)
RBC: 3.48 Mil/uL — ABNORMAL LOW (ref 3.87–5.11)
RDW: 15.7 % — ABNORMAL HIGH (ref 11.5–15.5)
WBC: 20.3 10*3/uL (ref 4.0–10.5)

## 2022-09-25 LAB — COMPREHENSIVE METABOLIC PANEL
ALT: 46 U/L — ABNORMAL HIGH (ref 0–35)
AST: 97 U/L — ABNORMAL HIGH (ref 0–37)
Albumin: 3 g/dL — ABNORMAL LOW (ref 3.5–5.2)
Alkaline Phosphatase: 300 U/L — ABNORMAL HIGH (ref 39–117)
BUN: 43 mg/dL — ABNORMAL HIGH (ref 6–23)
CO2: 21 mEq/L (ref 19–32)
Calcium: 9.4 mg/dL (ref 8.4–10.5)
Chloride: 89 mEq/L — ABNORMAL LOW (ref 96–112)
Creatinine, Ser: 1.67 mg/dL — ABNORMAL HIGH (ref 0.40–1.20)
GFR: 34.93 mL/min — ABNORMAL LOW (ref >60.00)
Glucose, Bld: 116 mg/dL — ABNORMAL HIGH (ref 70–99)
Potassium: 5.3 mEq/L — ABNORMAL HIGH (ref 3.5–5.1)
Sodium: 122 mEq/L — ABNORMAL LOW (ref 135–145)
Total Bilirubin: 30 mg/dL — ABNORMAL HIGH (ref 0.2–1.2)
Total Protein: 5.9 g/dL — ABNORMAL LOW (ref 6.0–8.3)

## 2022-09-25 LAB — PROTIME-INR
INR: 2.5 ratio — ABNORMAL HIGH (ref 0.8–1.0)
Prothrombin Time: 25.8 s — ABNORMAL HIGH (ref 9.6–13.1)

## 2022-09-25 LAB — AMMONIA: Ammonia: 66 umol/L — ABNORMAL HIGH (ref 11–35)

## 2022-09-25 NOTE — Telephone Encounter (Signed)
The pt came in this morning and had labs completed. She will be contacted as soon as we have results.

## 2022-09-25 NOTE — Telephone Encounter (Signed)
Spoke with patients spouse Marcello Moores.  Work in [place] scheduled with Dr. Quincy Simmonds on 09/28/22 at 1615, arrive 1600.   Spouse verbalizes understanding and is agreeable to date/time.  Encounter closed.

## 2022-09-25 NOTE — Telephone Encounter (Signed)
FMLA forms have been completed for both patient and spouse. They have been faxed back to [employer]. Copies of the FMLA forms have been scanned into patient's chart and her husband's chart. Patient has been informed that form have been completed.

## 2022-09-25 NOTE — Progress Notes (Deleted)
GYNECOLOGY  VISIT   HPI: 51 y.o.   Married  Caucasian  female   G1P1002 with No LMP recorded. Patient has had an ablation.   here for   pre-op pap smear  GYNECOLOGIC HISTORY: No LMP recorded. Patient has had an ablation. Contraception:  ablation Menopausal hormone therapy:  n/a Last mammogram:  09/15/21 Breast Density Category C, BI-RADS CATEGORY 3 probably benign Last pap smear:   07/16/21 negative: HR HPV negative        OB History     Gravida  1   Para  1   Term  1   Preterm      AB      Living  2      SAB      IAB      Ectopic      Multiple  1   Live Births  2              Patient Active Problem List   Diagnosis Date Noted   Estrogen deficiency 09/24/2022   Hyponatremia 09/23/2022   Acute on chronic alcoholic liver disease ([place]) 09/22/2022   Decompensation of cirrhosis of liver ([place]) 09/22/2022   History of alcoholic hepatitis 87/56/4332   Lethargy 09/22/2022   Acute hepatic encephalopathy ([place]) 09/21/2022   Cirrhosis, alcoholic ([place]) 95/18/8416   Macrocytosis without anemia 09/02/2022   Acute liver failure without hepatic coma 09/01/2022   Shortness of breath 05/05/2022   Bronchitis due to COVID-19 virus 05/05/2022   Alcohol dependence ([place]) 60/63/0160   Alcoholic hepatitis 10/93/2355   Alcohol withdrawal syndrome without complication (H[facility])    Elevated liver transaminase level 09/03/2019   Subclinical hypothyroidism 09/03/2019   Vitamin D deficiency 03/02/2018   Fatigue 03/02/2018   Pupil asymmetry 08/04/2016   Hemorrhoids 12/28/2014   Prediabetes 08/28/2014   Tremor 06/30/2013   Sebaceous cyst 02/15/2013   OTHER&UNSPECIFIED DISEASES THE ORAL SOFT TISSUES 07/25/2008   DERMATOFIBROMA, ARM 11/11/2007   NEOPLASM, SKIN, UNCERTAIN BEHAVIOR 73/22/0254   Hyperlipidemia 02/10/2007   Anxiety disorder 02/10/2007   ALLERGIC RHINITIS 02/10/2007   GERD 02/10/2007   IBS 02/10/2007    Past Medical History:  Diagnosis Date   Allergy    allergic  rhinitis   Anxiety    situational   COVID 05/2021   Difficult intubation    states 2012 ablation surgery, had diff intubation,glide scope used, not noted in EPIC anesthesia record   Elevated hemoglobin A1c 08/08/2015   Elevated liver function tests 08/08/2015   GERD (gastroesophageal reflux disease)    Hyperlipidemia    Low serum vitamin D 08/08/2015   Status post endometrial ablation    Tennis elbow    currently in PT-10/16    Past Surgical History:  Procedure Laterality Date   BIOPSY  02/09/2022   Procedure: BIOPSY;  Surgeon: Irving Copas., MD;  Location: [facility];  Service: Gastroenterology;;   CESAREAN SECTION     COLONOSCOPY WITH PROPOFOL N/A 02/09/2022   Procedure: COLONOSCOPY WITH PROPOFOL;  Surgeon: Irving Copas., MD;  Location: [facility];  Service: Gastroenterology;  Laterality: N/A;   ENDOMETRIAL ABLATION     8/12   ESOPHAGOGASTRODUODENOSCOPY (EGD) WITH PROPOFOL N/A 02/09/2022   Procedure: ESOPHAGOGASTRODUODENOSCOPY (EGD) WITH PROPOFOL;  Surgeon: Rush Landmark Telford Nab., MD;  Location: [facility];  Service: Gastroenterology;  Laterality: N/A;   EXTREMITY WIRE/PIN REMOVAL Left 07/16/2016   Procedure: PIN REMOVAL OF LEFT WRIST X 2;  Surgeon: Roseanne Kaufman, MD;  Location: [facility];  Service: Orthopedics;  Laterality: Left;   FOOT SURGERY Left    LAPAROSCOPIC TUBAL LIGATION  06/09/2011   Procedure: LAPAROSCOPIC TUBAL LIGATION;  Surgeon: Arloa Koh;  Location: [facility];  Service: Gynecology;  Laterality: N/A;   LIGAMENT REPAIR Left 05/08/2016   Procedure: LEFT WRIST SCAPHOLUNATE LIGAMENT RECONSTRUCTION WITH TENDON GRAFT PIN NEURECTOMY AND REPAIR;  Surgeon: Roseanne Kaufman, MD;  Location: [facility];  Service: Orthopedics;  Laterality: Left;   POLYPECTOMY  02/09/2022   Procedure: POLYPECTOMY;  Surgeon: Irving Copas., MD;  Location: [facility];  Service: Gastroenterology;;   TONSILLECTOMY     as child     Current Outpatient Medications  Medication Sig Dispense Refill   phytonadione (VITAMIN K) 5 MG tablet Take 1 tablet (5 mg total) by mouth daily for 14 doses. 14 tablet 0   ALPRAZolam (XANAX) 0.5 MG tablet Take 1 tablet (0.5 mg total) by mouth 2 (two) times daily as needed for anxiety. 30 tablet 0   Ascorbic Acid (VITAMIN C) 1000 MG tablet Take 1,000 mg by mouth daily.     Brimonidine Tartrate (LUMIFY) 0.025 % SOLN Place 1 drop into both eyes as needed (brighten eyes).     bumetanide (BUMEX) 1 MG tablet Take 1 tablet (1 mg total) by mouth daily. (Patient taking differently: Take 0.5 mg by mouth daily.) 30 tablet 3   Calcium Citrate-Vitamin D (CALCIUM + D PO) Take 1 tablet by mouth daily.     Cholecalciferol (VITAMIN D) 50 MCG (2000 UT) CAPS Take 2,000 Units by mouth 3 (three) times a week. Mon, Wed, Friday     fexofenadine (ALLEGRA) 180 MG tablet Take 180 mg by mouth daily as needed for allergies.     folic acid (FOLVITE) 1 MG tablet Take 1 mg by mouth daily.     hydrocortisone (ANUSOL-HC) 25 MG suppository Place 1 suppository (25 mg total) rectally at bedtime as needed for hemorrhoids or anal itching. (Patient taking differently: Place 25 mg rectally daily as needed for hemorrhoids or anal itching.) 12 suppository 1   lactulose (CHRONULAC) 10 GM/15ML solution Take 30 mLs (20 g total) by mouth daily. 236 mL 0   Multiple Vitamin (MULTIVITAMIN) capsule Take 1 capsule by mouth daily.       omeprazole (PRILOSEC) 20 MG capsule Take 20 mg by mouth daily.     Polyvinyl Alcohol-Povidone (REFRESH OP) Place 1-2 drops into both eyes 4 (four) times daily as needed (dry eye).     potassium chloride SA (KLOR-CON M) 20 MEQ tablet Take 1 tablet (20 mEq total) by mouth daily. 30 tablet 1   Simethicone 250 MG CAPS Take 250 mg by mouth 2 (two) times daily as needed (flatulence).     spironolactone (ALDACTONE) 100 MG tablet Take 1 tablet (100 mg total) by mouth daily. (Patient taking differently: Take 50 mg by  mouth daily.) 30 tablet 3   thiamine 500 MG tablet Take 500 mg by mouth daily.     No current facility-administered medications for this visit.     ALLERGIES: Lipitor [atorvastatin]  Family History  Problem Relation Age of Onset   Cancer Mother        CA insitu of appendix   Heart disease Father        CAD   Diabetes Father        type II   Alzheimer's disease Father    Colon polyps Father    Diabetes Brother        type II   Diabetes  Maternal Grandfather    Diabetes Paternal Grandmother    Diabetes Paternal Grandfather    Breast cancer Neg Hx    Colon cancer Neg Hx    Stomach cancer Neg Hx    Esophageal cancer Neg Hx    Pancreatic cancer Neg Hx     Social History   Socioeconomic History   Marital status: Married    Spouse name: Not on file   Number of children: 2   Years of education: Not on file   Highest education level: Not on file  Occupational History   Occupation: Nurse  Tobacco Use   Smoking status: Former    Types: Cigarettes    Quit date: 07/25/1983    Years since quitting: 39.1   Smokeless tobacco: Never   Tobacco comments:    in high school  Vaping Use   Vaping Use: Never used  Substance and Sexual Activity   Alcohol use: Not Currently    Alcohol/week: 14.0 standard drinks of alcohol    Types: 14 Glasses of wine per week    Comment: 6 shots daily 12 shots daily on weekends   Drug use: No   Sexual activity: Yes    Partners: Male    Birth control/protection: Surgical    Comment: Tubal ligation  Other Topics Concern   Not on file  Social History Narrative   Not on file   Social Determinants of Health   Financial Resource Strain: Not on file  Food Insecurity: No Food Insecurity (09/21/2022)   Hunger Vital Sign    Worried About Running Out of Food in the Last Year: Never true    Ran Out of Food in the Last Year: Never true  Transportation Needs: No Transportation Needs (09/21/2022)   PRAPARE - Hydrologist  (Medical): No    Lack of Transportation (Non-Medical): No  Physical Activity: Not on file  Stress: Not on file  Social Connections: Not on file  Intimate Partner Violence: Not At Risk (09/22/2022)   Humiliation, Afraid, Rape, and Kick questionnaire    Fear of Current or Ex-Partner: No    Emotionally Abused: No    Physically Abused: No    Sexually Abused: No    Review of Systems  PHYSICAL EXAMINATION:    There were no vitals taken for this visit.    General appearance: alert, cooperative and appears stated age Head: Normocephalic, without obvious abnormality, atraumatic Neck: no adenopathy, supple, symmetrical, trachea midline and thyroid normal to inspection and palpation Lungs: clear to auscultation bilaterally Breasts: normal appearance, no masses or tenderness, No nipple retraction or dimpling, No nipple discharge or bleeding, No axillary or supraclavicular adenopathy Heart: regular rate and rhythm Abdomen: soft, non-tender, no masses,  no organomegaly Extremities: extremities normal, atraumatic, no cyanosis or edema Skin: Skin color, texture, turgor normal. No rashes or lesions Lymph nodes: Cervical, supraclavicular, and axillary nodes normal. No abnormal inguinal nodes palpated Neurologic: Grossly normal  Pelvic: External genitalia:  no lesions              Urethra:  normal appearing urethra with no masses, tenderness or lesions              Bartholins and Skenes: normal                 Vagina: normal appearing vagina with normal color and discharge, no lesions              Cervix: no lesions  Bimanual Exam:  Uterus:  normal size, contour, position, consistency, mobility, non-tender              Adnexa: no mass, fullness, tenderness              Rectal exam: {yes no:[id number]}.  Confirms.              Anus:  normal sphincter tone, no lesions  Chaperone was present for exam:  ***  ASSESSMENT     PLAN     An After Visit Summary was printed and given  to the patient.  ______ minutes face to face time of which over 50% was spent in counseling.

## 2022-09-25 NOTE — Telephone Encounter (Signed)
Spoke with pt husband Marcello Moores : Marcello Moores stated that they talked to Dr. Rush Landmark this AM and they are on there way to [place] per his recommendations: Marcello Moores questioned FMLA paperwork for the pt Juliana and himself and stated that he has spoken with Chong Sicilian about this. I notified Marcello Moores  that I will reach out to Patty RN in regard to his request:

## 2022-09-25 NOTE — Telephone Encounter (Signed)
Patient's husband is calling seeking a call from a nurse states his wife's condition is worsening and he is not sure what to do. Please advise

## 2022-09-25 NOTE — Telephone Encounter (Signed)
Please note below

## 2022-09-25 NOTE — Telephone Encounter (Signed)
Spouse notified order is in and she can call and schedule appt when able

## 2022-09-25 NOTE — Telephone Encounter (Signed)
Ro have you spoken to the pt about FMLA paperwork?  I have not seen anything.

## 2022-09-26 DIAGNOSIS — K7010 Alcoholic hepatitis without ascites: Secondary | ICD-10-CM | POA: Diagnosis not present

## 2022-09-26 DIAGNOSIS — F1021 Alcohol dependence, in remission: Secondary | ICD-10-CM | POA: Diagnosis not present

## 2022-09-26 DIAGNOSIS — Z01818 Encounter for other preprocedural examination: Secondary | ICD-10-CM | POA: Diagnosis not present

## 2022-09-26 DIAGNOSIS — D689 Coagulation defect, unspecified: Secondary | ICD-10-CM | POA: Diagnosis not present

## 2022-09-26 DIAGNOSIS — D72829 Elevated white blood cell count, unspecified: Secondary | ICD-10-CM | POA: Diagnosis not present

## 2022-09-26 DIAGNOSIS — F419 Anxiety disorder, unspecified: Secondary | ICD-10-CM | POA: Diagnosis not present

## 2022-09-26 DIAGNOSIS — K7030 Alcoholic cirrhosis of liver without ascites: Secondary | ICD-10-CM | POA: Diagnosis not present

## 2022-09-26 LAB — CULTURE, BLOOD (SINGLE)
Culture: NO GROWTH
Special Requests: ADEQUATE

## 2022-09-27 DIAGNOSIS — F419 Anxiety disorder, unspecified: Secondary | ICD-10-CM | POA: Diagnosis not present

## 2022-09-27 DIAGNOSIS — N179 Acute kidney failure, unspecified: Secondary | ICD-10-CM | POA: Diagnosis not present

## 2022-09-27 DIAGNOSIS — K7010 Alcoholic hepatitis without ascites: Secondary | ICD-10-CM | POA: Diagnosis not present

## 2022-09-27 DIAGNOSIS — K7030 Alcoholic cirrhosis of liver without ascites: Secondary | ICD-10-CM | POA: Diagnosis not present

## 2022-09-27 DIAGNOSIS — D72829 Elevated white blood cell count, unspecified: Secondary | ICD-10-CM | POA: Diagnosis not present

## 2022-09-27 DIAGNOSIS — F1021 Alcohol dependence, in remission: Secondary | ICD-10-CM | POA: Diagnosis not present

## 2022-09-28 ENCOUNTER — Ambulatory Visit: Payer: 59 | Admitting: Obstetrics and Gynecology

## 2022-09-28 DIAGNOSIS — D72829 Elevated white blood cell count, unspecified: Secondary | ICD-10-CM | POA: Diagnosis not present

## 2022-09-28 DIAGNOSIS — F1021 Alcohol dependence, in remission: Secondary | ICD-10-CM | POA: Diagnosis not present

## 2022-09-28 DIAGNOSIS — N179 Acute kidney failure, unspecified: Secondary | ICD-10-CM | POA: Diagnosis not present

## 2022-09-28 DIAGNOSIS — R0602 Shortness of breath: Secondary | ICD-10-CM | POA: Diagnosis not present

## 2022-09-28 DIAGNOSIS — K7010 Alcoholic hepatitis without ascites: Secondary | ICD-10-CM | POA: Diagnosis not present

## 2022-09-28 DIAGNOSIS — R92333 Mammographic heterogeneous density, bilateral breasts: Secondary | ICD-10-CM | POA: Diagnosis not present

## 2022-09-28 DIAGNOSIS — F419 Anxiety disorder, unspecified: Secondary | ICD-10-CM | POA: Diagnosis not present

## 2022-09-28 DIAGNOSIS — Z01810 Encounter for preprocedural cardiovascular examination: Secondary | ICD-10-CM | POA: Diagnosis not present

## 2022-09-28 DIAGNOSIS — Z515 Encounter for palliative care: Secondary | ICD-10-CM | POA: Diagnosis not present

## 2022-09-28 DIAGNOSIS — K7030 Alcoholic cirrhosis of liver without ascites: Secondary | ICD-10-CM | POA: Diagnosis not present

## 2022-09-28 DIAGNOSIS — Z7189 Other specified counseling: Secondary | ICD-10-CM | POA: Diagnosis not present

## 2022-09-28 DIAGNOSIS — K1329 Other disturbances of oral epithelium, including tongue: Secondary | ICD-10-CM | POA: Diagnosis not present

## 2022-09-28 DIAGNOSIS — Z7682 Awaiting organ transplant status: Secondary | ICD-10-CM | POA: Diagnosis not present

## 2022-09-28 DIAGNOSIS — Z01818 Encounter for other preprocedural examination: Secondary | ICD-10-CM | POA: Diagnosis not present

## 2022-09-29 DIAGNOSIS — F1021 Alcohol dependence, in remission: Secondary | ICD-10-CM | POA: Diagnosis not present

## 2022-09-29 DIAGNOSIS — D72829 Elevated white blood cell count, unspecified: Secondary | ICD-10-CM | POA: Diagnosis not present

## 2022-09-29 DIAGNOSIS — K7010 Alcoholic hepatitis without ascites: Secondary | ICD-10-CM | POA: Diagnosis not present

## 2022-09-29 DIAGNOSIS — F419 Anxiety disorder, unspecified: Secondary | ICD-10-CM | POA: Diagnosis not present

## 2022-09-29 DIAGNOSIS — K7030 Alcoholic cirrhosis of liver without ascites: Secondary | ICD-10-CM | POA: Diagnosis not present

## 2022-09-29 DIAGNOSIS — Z7682 Awaiting organ transplant status: Secondary | ICD-10-CM | POA: Diagnosis not present

## 2022-09-30 DIAGNOSIS — E871 Hypo-osmolality and hyponatremia: Secondary | ICD-10-CM | POA: Diagnosis not present

## 2022-09-30 DIAGNOSIS — F1014 Alcohol abuse with alcohol-induced mood disorder: Secondary | ICD-10-CM | POA: Diagnosis not present

## 2022-09-30 DIAGNOSIS — D72829 Elevated white blood cell count, unspecified: Secondary | ICD-10-CM | POA: Diagnosis not present

## 2022-09-30 DIAGNOSIS — F419 Anxiety disorder, unspecified: Secondary | ICD-10-CM | POA: Diagnosis not present

## 2022-09-30 DIAGNOSIS — Z7682 Awaiting organ transplant status: Secondary | ICD-10-CM | POA: Diagnosis not present

## 2022-09-30 DIAGNOSIS — F1021 Alcohol dependence, in remission: Secondary | ICD-10-CM | POA: Diagnosis not present

## 2022-09-30 DIAGNOSIS — K7030 Alcoholic cirrhosis of liver without ascites: Secondary | ICD-10-CM | POA: Diagnosis not present

## 2022-09-30 DIAGNOSIS — K7010 Alcoholic hepatitis without ascites: Secondary | ICD-10-CM | POA: Diagnosis not present

## 2022-10-01 DIAGNOSIS — K7030 Alcoholic cirrhosis of liver without ascites: Secondary | ICD-10-CM | POA: Diagnosis not present

## 2022-10-01 DIAGNOSIS — Z7682 Awaiting organ transplant status: Secondary | ICD-10-CM | POA: Diagnosis not present

## 2022-10-01 DIAGNOSIS — D72829 Elevated white blood cell count, unspecified: Secondary | ICD-10-CM | POA: Diagnosis not present

## 2022-10-01 DIAGNOSIS — K7010 Alcoholic hepatitis without ascites: Secondary | ICD-10-CM | POA: Diagnosis not present

## 2022-10-01 DIAGNOSIS — F1021 Alcohol dependence, in remission: Secondary | ICD-10-CM | POA: Diagnosis not present

## 2022-10-01 DIAGNOSIS — F419 Anxiety disorder, unspecified: Secondary | ICD-10-CM | POA: Diagnosis not present

## 2022-10-01 DIAGNOSIS — E871 Hypo-osmolality and hyponatremia: Secondary | ICD-10-CM | POA: Diagnosis not present

## 2022-10-02 DIAGNOSIS — E871 Hypo-osmolality and hyponatremia: Secondary | ICD-10-CM | POA: Diagnosis not present

## 2022-10-02 DIAGNOSIS — F419 Anxiety disorder, unspecified: Secondary | ICD-10-CM | POA: Diagnosis not present

## 2022-10-02 DIAGNOSIS — K7010 Alcoholic hepatitis without ascites: Secondary | ICD-10-CM | POA: Diagnosis not present

## 2022-10-02 DIAGNOSIS — D72829 Elevated white blood cell count, unspecified: Secondary | ICD-10-CM | POA: Diagnosis not present

## 2022-10-02 DIAGNOSIS — F1021 Alcohol dependence, in remission: Secondary | ICD-10-CM | POA: Diagnosis not present

## 2022-10-02 DIAGNOSIS — Z7682 Awaiting organ transplant status: Secondary | ICD-10-CM | POA: Diagnosis not present

## 2022-10-02 DIAGNOSIS — K7030 Alcoholic cirrhosis of liver without ascites: Secondary | ICD-10-CM | POA: Diagnosis not present

## 2022-10-03 DIAGNOSIS — Z7682 Awaiting organ transplant status: Secondary | ICD-10-CM | POA: Diagnosis not present

## 2022-10-03 DIAGNOSIS — K7030 Alcoholic cirrhosis of liver without ascites: Secondary | ICD-10-CM | POA: Diagnosis not present

## 2022-10-03 DIAGNOSIS — K7010 Alcoholic hepatitis without ascites: Secondary | ICD-10-CM | POA: Diagnosis not present

## 2022-10-04 DIAGNOSIS — K7010 Alcoholic hepatitis without ascites: Secondary | ICD-10-CM | POA: Diagnosis not present

## 2022-10-04 DIAGNOSIS — Z7682 Awaiting organ transplant status: Secondary | ICD-10-CM | POA: Diagnosis not present

## 2022-10-04 DIAGNOSIS — K7030 Alcoholic cirrhosis of liver without ascites: Secondary | ICD-10-CM | POA: Diagnosis not present

## 2022-10-05 DIAGNOSIS — K7030 Alcoholic cirrhosis of liver without ascites: Secondary | ICD-10-CM | POA: Diagnosis not present

## 2022-10-05 DIAGNOSIS — Z7682 Awaiting organ transplant status: Secondary | ICD-10-CM | POA: Diagnosis not present

## 2022-10-05 DIAGNOSIS — K7010 Alcoholic hepatitis without ascites: Secondary | ICD-10-CM | POA: Diagnosis not present

## 2022-10-06 DIAGNOSIS — K7030 Alcoholic cirrhosis of liver without ascites: Secondary | ICD-10-CM | POA: Diagnosis not present

## 2022-10-06 DIAGNOSIS — K7010 Alcoholic hepatitis without ascites: Secondary | ICD-10-CM | POA: Diagnosis not present

## 2022-10-06 DIAGNOSIS — Z7682 Awaiting organ transplant status: Secondary | ICD-10-CM | POA: Diagnosis not present

## 2022-10-07 DIAGNOSIS — K7030 Alcoholic cirrhosis of liver without ascites: Secondary | ICD-10-CM | POA: Diagnosis not present

## 2022-10-07 DIAGNOSIS — N179 Acute kidney failure, unspecified: Secondary | ICD-10-CM | POA: Diagnosis not present

## 2022-10-07 DIAGNOSIS — K7460 Unspecified cirrhosis of liver: Secondary | ICD-10-CM | POA: Diagnosis not present

## 2022-10-07 DIAGNOSIS — Z4659 Encounter for fitting and adjustment of other gastrointestinal appliance and device: Secondary | ICD-10-CM | POA: Diagnosis not present

## 2022-10-07 DIAGNOSIS — K7290 Hepatic failure, unspecified without coma: Secondary | ICD-10-CM | POA: Diagnosis not present

## 2022-10-07 DIAGNOSIS — R918 Other nonspecific abnormal finding of lung field: Secondary | ICD-10-CM | POA: Diagnosis not present

## 2022-10-07 DIAGNOSIS — K760 Fatty (change of) liver, not elsewhere classified: Secondary | ICD-10-CM | POA: Diagnosis not present

## 2022-10-07 DIAGNOSIS — Z7682 Awaiting organ transplant status: Secondary | ICD-10-CM | POA: Diagnosis not present

## 2022-10-07 DIAGNOSIS — K7689 Other specified diseases of liver: Secondary | ICD-10-CM | POA: Diagnosis not present

## 2022-10-07 DIAGNOSIS — K8010 Calculus of gallbladder with chronic cholecystitis without obstruction: Secondary | ICD-10-CM | POA: Diagnosis not present

## 2022-10-07 DIAGNOSIS — D62 Acute posthemorrhagic anemia: Secondary | ICD-10-CM | POA: Diagnosis not present

## 2022-10-07 DIAGNOSIS — K219 Gastro-esophageal reflux disease without esophagitis: Secondary | ICD-10-CM | POA: Diagnosis not present

## 2022-10-08 ENCOUNTER — Other Ambulatory Visit: Payer: 59

## 2022-10-08 DIAGNOSIS — K2100 Gastro-esophageal reflux disease with esophagitis, without bleeding: Secondary | ICD-10-CM | POA: Diagnosis not present

## 2022-10-08 DIAGNOSIS — K7030 Alcoholic cirrhosis of liver without ascites: Secondary | ICD-10-CM | POA: Diagnosis not present

## 2022-10-08 DIAGNOSIS — D849 Immunodeficiency, unspecified: Secondary | ICD-10-CM | POA: Diagnosis not present

## 2022-10-08 DIAGNOSIS — N179 Acute kidney failure, unspecified: Secondary | ICD-10-CM | POA: Diagnosis not present

## 2022-10-08 DIAGNOSIS — D62 Acute posthemorrhagic anemia: Secondary | ICD-10-CM | POA: Diagnosis not present

## 2022-10-08 DIAGNOSIS — Z792 Long term (current) use of antibiotics: Secondary | ICD-10-CM | POA: Diagnosis not present

## 2022-10-08 DIAGNOSIS — D735 Infarction of spleen: Secondary | ICD-10-CM | POA: Diagnosis not present

## 2022-10-08 DIAGNOSIS — T380X5A Adverse effect of glucocorticoids and synthetic analogues, initial encounter: Secondary | ICD-10-CM | POA: Diagnosis not present

## 2022-10-08 DIAGNOSIS — Z944 Liver transplant status: Secondary | ICD-10-CM | POA: Diagnosis not present

## 2022-10-08 DIAGNOSIS — Z9889 Other specified postprocedural states: Secondary | ICD-10-CM | POA: Diagnosis not present

## 2022-10-08 DIAGNOSIS — R739 Hyperglycemia, unspecified: Secondary | ICD-10-CM | POA: Diagnosis not present

## 2022-10-09 DIAGNOSIS — R109 Unspecified abdominal pain: Secondary | ICD-10-CM | POA: Diagnosis not present

## 2022-10-09 DIAGNOSIS — Z452 Encounter for adjustment and management of vascular access device: Secondary | ICD-10-CM | POA: Diagnosis not present

## 2022-10-09 DIAGNOSIS — R918 Other nonspecific abnormal finding of lung field: Secondary | ICD-10-CM | POA: Diagnosis not present

## 2022-10-09 DIAGNOSIS — R739 Hyperglycemia, unspecified: Secondary | ICD-10-CM | POA: Diagnosis not present

## 2022-10-09 DIAGNOSIS — K7290 Hepatic failure, unspecified without coma: Secondary | ICD-10-CM | POA: Diagnosis not present

## 2022-10-09 DIAGNOSIS — N179 Acute kidney failure, unspecified: Secondary | ICD-10-CM | POA: Diagnosis not present

## 2022-10-09 DIAGNOSIS — D72829 Elevated white blood cell count, unspecified: Secondary | ICD-10-CM | POA: Diagnosis not present

## 2022-10-09 DIAGNOSIS — Z944 Liver transplant status: Secondary | ICD-10-CM | POA: Diagnosis not present

## 2022-10-09 DIAGNOSIS — T380X5A Adverse effect of glucocorticoids and synthetic analogues, initial encounter: Secondary | ICD-10-CM | POA: Diagnosis not present

## 2022-10-09 DIAGNOSIS — F419 Anxiety disorder, unspecified: Secondary | ICD-10-CM | POA: Diagnosis not present

## 2022-10-09 DIAGNOSIS — D62 Acute posthemorrhagic anemia: Secondary | ICD-10-CM | POA: Diagnosis not present

## 2022-10-09 DIAGNOSIS — J9601 Acute respiratory failure with hypoxia: Secondary | ICD-10-CM | POA: Diagnosis not present

## 2022-10-09 DIAGNOSIS — D735 Infarction of spleen: Secondary | ICD-10-CM | POA: Diagnosis not present

## 2022-10-09 DIAGNOSIS — Z792 Long term (current) use of antibiotics: Secondary | ICD-10-CM | POA: Diagnosis not present

## 2022-10-09 DIAGNOSIS — K7030 Alcoholic cirrhosis of liver without ascites: Secondary | ICD-10-CM | POA: Diagnosis not present

## 2022-10-09 DIAGNOSIS — K7010 Alcoholic hepatitis without ascites: Secondary | ICD-10-CM | POA: Diagnosis not present

## 2022-10-09 DIAGNOSIS — D849 Immunodeficiency, unspecified: Secondary | ICD-10-CM | POA: Diagnosis not present

## 2022-10-09 DIAGNOSIS — E871 Hypo-osmolality and hyponatremia: Secondary | ICD-10-CM | POA: Diagnosis not present

## 2022-10-09 DIAGNOSIS — R0602 Shortness of breath: Secondary | ICD-10-CM | POA: Diagnosis not present

## 2022-10-09 DIAGNOSIS — F1021 Alcohol dependence, in remission: Secondary | ICD-10-CM | POA: Diagnosis not present

## 2022-10-10 DIAGNOSIS — K7030 Alcoholic cirrhosis of liver without ascites: Secondary | ICD-10-CM | POA: Diagnosis not present

## 2022-10-10 DIAGNOSIS — D62 Acute posthemorrhagic anemia: Secondary | ICD-10-CM | POA: Diagnosis not present

## 2022-10-10 DIAGNOSIS — D735 Infarction of spleen: Secondary | ICD-10-CM | POA: Diagnosis not present

## 2022-10-10 DIAGNOSIS — K2100 Gastro-esophageal reflux disease with esophagitis, without bleeding: Secondary | ICD-10-CM | POA: Diagnosis not present

## 2022-10-10 DIAGNOSIS — Z792 Long term (current) use of antibiotics: Secondary | ICD-10-CM | POA: Diagnosis not present

## 2022-10-10 DIAGNOSIS — K7031 Alcoholic cirrhosis of liver with ascites: Secondary | ICD-10-CM | POA: Diagnosis not present

## 2022-10-10 DIAGNOSIS — D849 Immunodeficiency, unspecified: Secondary | ICD-10-CM | POA: Diagnosis not present

## 2022-10-10 DIAGNOSIS — N179 Acute kidney failure, unspecified: Secondary | ICD-10-CM | POA: Diagnosis not present

## 2022-10-10 DIAGNOSIS — Z944 Liver transplant status: Secondary | ICD-10-CM | POA: Diagnosis not present

## 2022-10-11 DIAGNOSIS — N179 Acute kidney failure, unspecified: Secondary | ICD-10-CM | POA: Diagnosis not present

## 2022-10-11 DIAGNOSIS — E871 Hypo-osmolality and hyponatremia: Secondary | ICD-10-CM | POA: Diagnosis not present

## 2022-10-11 DIAGNOSIS — Z792 Long term (current) use of antibiotics: Secondary | ICD-10-CM | POA: Diagnosis not present

## 2022-10-11 DIAGNOSIS — F1021 Alcohol dependence, in remission: Secondary | ICD-10-CM | POA: Diagnosis not present

## 2022-10-11 DIAGNOSIS — D735 Infarction of spleen: Secondary | ICD-10-CM | POA: Diagnosis not present

## 2022-10-11 DIAGNOSIS — K7290 Hepatic failure, unspecified without coma: Secondary | ICD-10-CM | POA: Diagnosis not present

## 2022-10-11 DIAGNOSIS — Z944 Liver transplant status: Secondary | ICD-10-CM | POA: Diagnosis not present

## 2022-10-11 DIAGNOSIS — R739 Hyperglycemia, unspecified: Secondary | ICD-10-CM | POA: Diagnosis not present

## 2022-10-11 DIAGNOSIS — D62 Acute posthemorrhagic anemia: Secondary | ICD-10-CM | POA: Diagnosis not present

## 2022-10-11 DIAGNOSIS — D72829 Elevated white blood cell count, unspecified: Secondary | ICD-10-CM | POA: Diagnosis not present

## 2022-10-11 DIAGNOSIS — K7030 Alcoholic cirrhosis of liver without ascites: Secondary | ICD-10-CM | POA: Diagnosis not present

## 2022-10-11 DIAGNOSIS — D649 Anemia, unspecified: Secondary | ICD-10-CM | POA: Diagnosis not present

## 2022-10-11 DIAGNOSIS — D849 Immunodeficiency, unspecified: Secondary | ICD-10-CM | POA: Diagnosis not present

## 2022-10-11 DIAGNOSIS — K7010 Alcoholic hepatitis without ascites: Secondary | ICD-10-CM | POA: Diagnosis not present

## 2022-10-12 DIAGNOSIS — G8918 Other acute postprocedural pain: Secondary | ICD-10-CM | POA: Diagnosis not present

## 2022-10-12 DIAGNOSIS — D849 Immunodeficiency, unspecified: Secondary | ICD-10-CM | POA: Diagnosis not present

## 2022-10-12 DIAGNOSIS — Z87891 Personal history of nicotine dependence: Secondary | ICD-10-CM | POA: Diagnosis not present

## 2022-10-12 DIAGNOSIS — K91871 Postprocedural hematoma of a digestive system organ or structure following other procedure: Secondary | ICD-10-CM | POA: Diagnosis not present

## 2022-10-12 DIAGNOSIS — I959 Hypotension, unspecified: Secondary | ICD-10-CM | POA: Diagnosis not present

## 2022-10-12 DIAGNOSIS — B1910 Unspecified viral hepatitis B without hepatic coma: Secondary | ICD-10-CM | POA: Diagnosis not present

## 2022-10-12 DIAGNOSIS — K7010 Alcoholic hepatitis without ascites: Secondary | ICD-10-CM | POA: Diagnosis not present

## 2022-10-12 DIAGNOSIS — Z944 Liver transplant status: Secondary | ICD-10-CM | POA: Diagnosis not present

## 2022-10-12 DIAGNOSIS — K219 Gastro-esophageal reflux disease without esophagitis: Secondary | ICD-10-CM | POA: Diagnosis not present

## 2022-10-12 DIAGNOSIS — K7030 Alcoholic cirrhosis of liver without ascites: Secondary | ICD-10-CM | POA: Diagnosis not present

## 2022-10-12 DIAGNOSIS — N179 Acute kidney failure, unspecified: Secondary | ICD-10-CM | POA: Diagnosis not present

## 2022-10-13 ENCOUNTER — Ambulatory Visit: Payer: Commercial Managed Care - PPO | Admitting: Gastroenterology

## 2022-10-13 DIAGNOSIS — I959 Hypotension, unspecified: Secondary | ICD-10-CM | POA: Diagnosis not present

## 2022-10-13 DIAGNOSIS — N179 Acute kidney failure, unspecified: Secondary | ICD-10-CM | POA: Diagnosis not present

## 2022-10-13 DIAGNOSIS — Z9189 Other specified personal risk factors, not elsewhere classified: Secondary | ICD-10-CM | POA: Diagnosis not present

## 2022-10-13 DIAGNOSIS — Z792 Long term (current) use of antibiotics: Secondary | ICD-10-CM | POA: Diagnosis not present

## 2022-10-13 DIAGNOSIS — E871 Hypo-osmolality and hyponatremia: Secondary | ICD-10-CM | POA: Diagnosis not present

## 2022-10-13 DIAGNOSIS — T380X5A Adverse effect of glucocorticoids and synthetic analogues, initial encounter: Secondary | ICD-10-CM | POA: Diagnosis not present

## 2022-10-13 DIAGNOSIS — K7030 Alcoholic cirrhosis of liver without ascites: Secondary | ICD-10-CM | POA: Diagnosis not present

## 2022-10-13 DIAGNOSIS — J952 Acute pulmonary insufficiency following nonthoracic surgery: Secondary | ICD-10-CM | POA: Diagnosis not present

## 2022-10-13 DIAGNOSIS — D849 Immunodeficiency, unspecified: Secondary | ICD-10-CM | POA: Diagnosis not present

## 2022-10-13 DIAGNOSIS — Z944 Liver transplant status: Secondary | ICD-10-CM | POA: Diagnosis not present

## 2022-10-13 DIAGNOSIS — K219 Gastro-esophageal reflux disease without esophagitis: Secondary | ICD-10-CM | POA: Diagnosis not present

## 2022-10-13 DIAGNOSIS — R739 Hyperglycemia, unspecified: Secondary | ICD-10-CM | POA: Diagnosis not present

## 2022-10-13 DIAGNOSIS — D735 Infarction of spleen: Secondary | ICD-10-CM | POA: Diagnosis not present

## 2022-10-13 DIAGNOSIS — B1910 Unspecified viral hepatitis B without hepatic coma: Secondary | ICD-10-CM | POA: Diagnosis not present

## 2022-10-13 DIAGNOSIS — G8918 Other acute postprocedural pain: Secondary | ICD-10-CM | POA: Diagnosis not present

## 2022-10-13 NOTE — Telephone Encounter (Signed)
Returned call. Informed patient that her husband forms ( 9 pages ref# [id number]) for Jamie Coleman was faxed back to [facility] this morning. If anything further is needed, please let us know. Patient states that she will give him the message.

## 2022-10-13 NOTE — Telephone Encounter (Signed)
Inbound call from patient spouse stating he received a call from [facility] stating a portion of the paperwork for spouse was not filled out. Please advise.  Thank you

## 2022-10-14 DIAGNOSIS — R5381 Other malaise: Secondary | ICD-10-CM | POA: Diagnosis not present

## 2022-10-14 DIAGNOSIS — D849 Immunodeficiency, unspecified: Secondary | ICD-10-CM | POA: Diagnosis not present

## 2022-10-14 DIAGNOSIS — Z9189 Other specified personal risk factors, not elsewhere classified: Secondary | ICD-10-CM | POA: Diagnosis not present

## 2022-10-14 DIAGNOSIS — T380X5A Adverse effect of glucocorticoids and synthetic analogues, initial encounter: Secondary | ICD-10-CM | POA: Diagnosis not present

## 2022-10-14 DIAGNOSIS — K7030 Alcoholic cirrhosis of liver without ascites: Secondary | ICD-10-CM | POA: Diagnosis not present

## 2022-10-14 DIAGNOSIS — Z944 Liver transplant status: Secondary | ICD-10-CM | POA: Diagnosis not present

## 2022-10-14 DIAGNOSIS — Z792 Long term (current) use of antibiotics: Secondary | ICD-10-CM | POA: Diagnosis not present

## 2022-10-14 DIAGNOSIS — E871 Hypo-osmolality and hyponatremia: Secondary | ICD-10-CM | POA: Diagnosis not present

## 2022-10-14 DIAGNOSIS — R739 Hyperglycemia, unspecified: Secondary | ICD-10-CM | POA: Diagnosis not present

## 2022-10-14 DIAGNOSIS — F1021 Alcohol dependence, in remission: Secondary | ICD-10-CM | POA: Diagnosis not present

## 2022-10-15 DIAGNOSIS — R739 Hyperglycemia, unspecified: Secondary | ICD-10-CM | POA: Diagnosis not present

## 2022-10-15 DIAGNOSIS — E871 Hypo-osmolality and hyponatremia: Secondary | ICD-10-CM | POA: Diagnosis not present

## 2022-10-15 DIAGNOSIS — F419 Anxiety disorder, unspecified: Secondary | ICD-10-CM | POA: Diagnosis not present

## 2022-10-15 DIAGNOSIS — N179 Acute kidney failure, unspecified: Secondary | ICD-10-CM | POA: Diagnosis not present

## 2022-10-15 DIAGNOSIS — K7030 Alcoholic cirrhosis of liver without ascites: Secondary | ICD-10-CM | POA: Diagnosis not present

## 2022-10-15 DIAGNOSIS — Z792 Long term (current) use of antibiotics: Secondary | ICD-10-CM | POA: Diagnosis not present

## 2022-10-15 DIAGNOSIS — D849 Immunodeficiency, unspecified: Secondary | ICD-10-CM | POA: Diagnosis not present

## 2022-10-15 DIAGNOSIS — Z9189 Other specified personal risk factors, not elsewhere classified: Secondary | ICD-10-CM | POA: Diagnosis not present

## 2022-10-15 DIAGNOSIS — Z944 Liver transplant status: Secondary | ICD-10-CM | POA: Diagnosis not present

## 2022-10-15 DIAGNOSIS — G8918 Other acute postprocedural pain: Secondary | ICD-10-CM | POA: Diagnosis not present

## 2022-10-15 DIAGNOSIS — K7290 Hepatic failure, unspecified without coma: Secondary | ICD-10-CM | POA: Diagnosis not present

## 2022-10-15 DIAGNOSIS — R5381 Other malaise: Secondary | ICD-10-CM | POA: Diagnosis not present

## 2022-10-15 DIAGNOSIS — F1021 Alcohol dependence, in remission: Secondary | ICD-10-CM | POA: Diagnosis not present

## 2022-10-16 DIAGNOSIS — N179 Acute kidney failure, unspecified: Secondary | ICD-10-CM | POA: Diagnosis not present

## 2022-10-16 DIAGNOSIS — D62 Acute posthemorrhagic anemia: Secondary | ICD-10-CM | POA: Diagnosis not present

## 2022-10-16 DIAGNOSIS — E871 Hypo-osmolality and hyponatremia: Secondary | ICD-10-CM | POA: Diagnosis not present

## 2022-10-16 DIAGNOSIS — D849 Immunodeficiency, unspecified: Secondary | ICD-10-CM | POA: Diagnosis not present

## 2022-10-16 DIAGNOSIS — K7030 Alcoholic cirrhosis of liver without ascites: Secondary | ICD-10-CM | POA: Diagnosis not present

## 2022-10-16 DIAGNOSIS — Z944 Liver transplant status: Secondary | ICD-10-CM | POA: Diagnosis not present

## 2022-10-16 DIAGNOSIS — R5381 Other malaise: Secondary | ICD-10-CM | POA: Diagnosis not present

## 2022-10-19 DIAGNOSIS — D849 Immunodeficiency, unspecified: Secondary | ICD-10-CM | POA: Diagnosis not present

## 2022-10-19 DIAGNOSIS — Z944 Liver transplant status: Secondary | ICD-10-CM | POA: Diagnosis not present

## 2022-10-20 DIAGNOSIS — R188 Other ascites: Secondary | ICD-10-CM | POA: Diagnosis not present

## 2022-10-20 DIAGNOSIS — Z944 Liver transplant status: Secondary | ICD-10-CM | POA: Diagnosis not present

## 2022-10-20 DIAGNOSIS — D72829 Elevated white blood cell count, unspecified: Secondary | ICD-10-CM | POA: Diagnosis not present

## 2022-10-20 DIAGNOSIS — R160 Hepatomegaly, not elsewhere classified: Secondary | ICD-10-CM | POA: Diagnosis not present

## 2022-10-21 ENCOUNTER — Other Ambulatory Visit (HOSPITAL_COMMUNITY): Payer: Self-pay

## 2022-10-21 DIAGNOSIS — K766 Portal hypertension: Secondary | ICD-10-CM | POA: Diagnosis not present

## 2022-10-21 DIAGNOSIS — R17 Unspecified jaundice: Secondary | ICD-10-CM | POA: Diagnosis not present

## 2022-10-21 DIAGNOSIS — I8510 Secondary esophageal varices without bleeding: Secondary | ICD-10-CM | POA: Diagnosis not present

## 2022-10-21 DIAGNOSIS — R5383 Other fatigue: Secondary | ICD-10-CM | POA: Diagnosis not present

## 2022-10-21 DIAGNOSIS — K7682 Hepatic encephalopathy: Secondary | ICD-10-CM | POA: Diagnosis not present

## 2022-10-21 DIAGNOSIS — K7031 Alcoholic cirrhosis of liver with ascites: Secondary | ICD-10-CM | POA: Diagnosis not present

## 2022-10-21 DIAGNOSIS — Z4823 Encounter for aftercare following liver transplant: Secondary | ICD-10-CM | POA: Diagnosis not present

## 2022-10-21 DIAGNOSIS — Z01818 Encounter for other preprocedural examination: Secondary | ICD-10-CM | POA: Diagnosis not present

## 2022-10-21 DIAGNOSIS — E871 Hypo-osmolality and hyponatremia: Secondary | ICD-10-CM | POA: Diagnosis not present

## 2022-10-21 DIAGNOSIS — R0602 Shortness of breath: Secondary | ICD-10-CM | POA: Diagnosis not present

## 2022-10-21 DIAGNOSIS — I10 Essential (primary) hypertension: Secondary | ICD-10-CM | POA: Diagnosis not present

## 2022-10-21 DIAGNOSIS — Z944 Liver transplant status: Secondary | ICD-10-CM | POA: Diagnosis not present

## 2022-10-21 DIAGNOSIS — D649 Anemia, unspecified: Secondary | ICD-10-CM | POA: Diagnosis not present

## 2022-10-21 DIAGNOSIS — R609 Edema, unspecified: Secondary | ICD-10-CM | POA: Diagnosis not present

## 2022-10-23 ENCOUNTER — Telehealth: Payer: Self-pay | Admitting: Family Medicine

## 2022-10-23 NOTE — Telephone Encounter (Signed)
Shirlee Limerick from [facility] Would like to know if Dr Glori Bickers or Dr Lorelei Pont is going to be following this patient for PT and OT ? She needs to know who will be signing her orders? Her paperwork has [facility] on them but [facility] is her Pcp.

## 2022-10-26 NOTE — Telephone Encounter (Signed)
Left VM letting Shirlee Limerick know Dr. Glori Bickers would be overseeing any orders not Dr. Lorelei Pont

## 2022-10-26 NOTE — Telephone Encounter (Signed)
You are correct, thanks

## 2022-10-26 NOTE — Telephone Encounter (Signed)
Will route to both providers for review

## 2022-10-26 NOTE — Telephone Encounter (Signed)
I have not seen the patient since 2017, so it would need to be the managing provider or Dr. Glori Bickers.

## 2022-10-28 DIAGNOSIS — R739 Hyperglycemia, unspecified: Secondary | ICD-10-CM | POA: Diagnosis not present

## 2022-10-28 DIAGNOSIS — E878 Other disorders of electrolyte and fluid balance, not elsewhere classified: Secondary | ICD-10-CM | POA: Diagnosis not present

## 2022-10-28 DIAGNOSIS — R5381 Other malaise: Secondary | ICD-10-CM | POA: Diagnosis not present

## 2022-10-28 DIAGNOSIS — M7989 Other specified soft tissue disorders: Secondary | ICD-10-CM | POA: Diagnosis not present

## 2022-10-28 DIAGNOSIS — D849 Immunodeficiency, unspecified: Secondary | ICD-10-CM | POA: Diagnosis not present

## 2022-10-28 DIAGNOSIS — T380X5A Adverse effect of glucocorticoids and synthetic analogues, initial encounter: Secondary | ICD-10-CM | POA: Diagnosis not present

## 2022-10-28 DIAGNOSIS — Z792 Long term (current) use of antibiotics: Secondary | ICD-10-CM | POA: Diagnosis not present

## 2022-10-28 DIAGNOSIS — E669 Obesity, unspecified: Secondary | ICD-10-CM | POA: Diagnosis not present

## 2022-10-28 DIAGNOSIS — F419 Anxiety disorder, unspecified: Secondary | ICD-10-CM | POA: Diagnosis not present

## 2022-10-28 DIAGNOSIS — Z944 Liver transplant status: Secondary | ICD-10-CM | POA: Diagnosis not present

## 2022-10-28 DIAGNOSIS — G8918 Other acute postprocedural pain: Secondary | ICD-10-CM | POA: Diagnosis not present

## 2022-11-02 ENCOUNTER — Other Ambulatory Visit (HOSPITAL_COMMUNITY): Payer: Self-pay

## 2022-11-03 DIAGNOSIS — Z944 Liver transplant status: Secondary | ICD-10-CM | POA: Diagnosis not present

## 2022-11-03 DIAGNOSIS — D849 Immunodeficiency, unspecified: Secondary | ICD-10-CM | POA: Diagnosis not present

## 2022-11-09 ENCOUNTER — Other Ambulatory Visit (HOSPITAL_COMMUNITY): Payer: Self-pay

## 2022-11-10 ENCOUNTER — Other Ambulatory Visit (HOSPITAL_COMMUNITY): Payer: Self-pay

## 2022-11-11 ENCOUNTER — Other Ambulatory Visit (HOSPITAL_COMMUNITY): Payer: Self-pay

## 2022-11-11 ENCOUNTER — Other Ambulatory Visit (HOSPITAL_BASED_OUTPATIENT_CLINIC_OR_DEPARTMENT_OTHER): Payer: Self-pay

## 2022-11-11 DIAGNOSIS — Z944 Liver transplant status: Secondary | ICD-10-CM | POA: Diagnosis not present

## 2022-11-11 DIAGNOSIS — Z1159 Encounter for screening for other viral diseases: Secondary | ICD-10-CM | POA: Diagnosis not present

## 2022-11-11 DIAGNOSIS — F1010 Alcohol abuse, uncomplicated: Secondary | ICD-10-CM | POA: Diagnosis not present

## 2022-11-11 DIAGNOSIS — D849 Immunodeficiency, unspecified: Secondary | ICD-10-CM | POA: Diagnosis not present

## 2022-11-11 DIAGNOSIS — Z114 Encounter for screening for human immunodeficiency virus [HIV]: Secondary | ICD-10-CM | POA: Diagnosis not present

## 2022-11-11 MED ORDER — GABAPENTIN 100 MG PO CAPS
100.0000 mg | ORAL_CAPSULE | Freq: Three times a day (TID) | ORAL | 0 refills | Status: DC
  Filled 2022-11-11: qty 90, 30d supply, fill #0

## 2022-11-11 MED ORDER — TACROLIMUS 1 MG PO CAPS
ORAL_CAPSULE | ORAL | 11 refills | Status: DC
  Filled 2022-11-11: qty 90, 30d supply, fill #0

## 2022-11-12 ENCOUNTER — Other Ambulatory Visit (HOSPITAL_BASED_OUTPATIENT_CLINIC_OR_DEPARTMENT_OTHER): Payer: Self-pay

## 2022-11-12 ENCOUNTER — Other Ambulatory Visit (HOSPITAL_COMMUNITY): Payer: Self-pay

## 2022-11-12 MED ORDER — MYCOPHENOLATE MOFETIL 250 MG PO CAPS
1000.0000 mg | ORAL_CAPSULE | Freq: Two times a day (BID) | ORAL | 10 refills | Status: DC
  Filled 2022-11-12: qty 240, 30d supply, fill #0

## 2022-11-12 MED ORDER — FREESTYLE LANCETS MISC: 0 refills | Status: DC

## 2022-11-12 MED ORDER — SULFAMETHOXAZOLE-TRIMETHOPRIM 800-160 MG PO TABS
1.0000 | ORAL_TABLET | ORAL | 0 refills | Status: DC
  Filled 2022-11-12: qty 23, 53d supply, fill #0

## 2022-11-12 MED ORDER — GLUCOSE BLOOD VI STRP: ORAL_STRIP | 0 refills | Status: DC

## 2022-11-12 MED ORDER — PREDNISONE 5 MG PO TABS
15.0000 mg | ORAL_TABLET | Freq: Every day | ORAL | 11 refills | Status: DC
  Filled 2022-11-12: qty 90, 30d supply, fill #0

## 2022-11-12 MED ORDER — CLOTRIMAZOLE 10 MG MT TROC
OROMUCOSAL | 1 refills | Status: DC
  Filled 2022-11-12: qty 60, 30d supply, fill #0
  Filled 2022-12-08: qty 60, 30d supply, fill #1

## 2022-11-12 MED ORDER — VALGANCICLOVIR HCL 450 MG PO TABS
450.0000 mg | ORAL_TABLET | Freq: Every day | ORAL | 4 refills | Status: DC
  Filled 2022-11-12: qty 30, 30d supply, fill #0
  Filled 2022-12-08: qty 30, 30d supply, fill #1
  Filled 2023-01-06: qty 30, 30d supply, fill #2
  Filled 2023-02-05: qty 30, 30d supply, fill #3

## 2022-11-12 MED ORDER — TACROLIMUS 1 MG PO CAPS
2.0000 mg | ORAL_CAPSULE | Freq: Two times a day (BID) | ORAL | 11 refills | Status: DC
  Filled 2022-11-12: qty 120, 30d supply, fill #0

## 2022-11-12 MED ORDER — FREESTYLE LITE W/DEVICE KIT: PACK | 0 refills | Status: DC

## 2022-11-12 MED ORDER — MAGNESIUM OXIDE -MG SUPPLEMENT 400 (240 MG) MG PO TABS: 1200.0000 mg | ORAL_TABLET | Freq: Two times a day (BID) | ORAL | 11 refills | Status: DC

## 2022-11-16 ENCOUNTER — Other Ambulatory Visit (HOSPITAL_COMMUNITY): Payer: Self-pay

## 2022-11-17 ENCOUNTER — Other Ambulatory Visit (HOSPITAL_BASED_OUTPATIENT_CLINIC_OR_DEPARTMENT_OTHER): Payer: Self-pay

## 2022-11-17 DIAGNOSIS — E871 Hypo-osmolality and hyponatremia: Secondary | ICD-10-CM | POA: Diagnosis not present

## 2022-11-17 DIAGNOSIS — D849 Immunodeficiency, unspecified: Secondary | ICD-10-CM | POA: Diagnosis not present

## 2022-11-17 DIAGNOSIS — B259 Cytomegaloviral disease, unspecified: Secondary | ICD-10-CM | POA: Diagnosis not present

## 2022-11-17 DIAGNOSIS — E878 Other disorders of electrolyte and fluid balance, not elsewhere classified: Secondary | ICD-10-CM | POA: Diagnosis not present

## 2022-11-17 DIAGNOSIS — F1010 Alcohol abuse, uncomplicated: Secondary | ICD-10-CM | POA: Diagnosis not present

## 2022-11-17 DIAGNOSIS — R197 Diarrhea, unspecified: Secondary | ICD-10-CM | POA: Diagnosis not present

## 2022-11-17 DIAGNOSIS — E569 Vitamin deficiency, unspecified: Secondary | ICD-10-CM | POA: Diagnosis not present

## 2022-11-17 DIAGNOSIS — R739 Hyperglycemia, unspecified: Secondary | ICD-10-CM | POA: Diagnosis not present

## 2022-11-17 DIAGNOSIS — Z944 Liver transplant status: Secondary | ICD-10-CM | POA: Diagnosis not present

## 2022-11-17 MED ORDER — TACROLIMUS 1 MG PO CAPS
2.0000 mg | ORAL_CAPSULE | Freq: Two times a day (BID) | ORAL | 11 refills | Status: DC
  Filled 2022-11-17: qty 120, 30d supply, fill #0

## 2022-11-18 ENCOUNTER — Other Ambulatory Visit (HOSPITAL_BASED_OUTPATIENT_CLINIC_OR_DEPARTMENT_OTHER): Payer: Self-pay

## 2022-11-18 MED ORDER — MYCOPHENOLATE MOFETIL 250 MG PO CAPS
750.0000 mg | ORAL_CAPSULE | Freq: Two times a day (BID) | ORAL | 11 refills | Status: DC
  Filled 2022-11-18 – 2022-12-08 (×2): qty 180, 30d supply, fill #0
  Filled 2023-01-06: qty 180, 30d supply, fill #1

## 2022-11-24 DIAGNOSIS — D849 Immunodeficiency, unspecified: Secondary | ICD-10-CM | POA: Diagnosis not present

## 2022-11-24 DIAGNOSIS — B259 Cytomegaloviral disease, unspecified: Secondary | ICD-10-CM | POA: Diagnosis not present

## 2022-11-24 DIAGNOSIS — Z944 Liver transplant status: Secondary | ICD-10-CM | POA: Diagnosis not present

## 2022-12-01 DIAGNOSIS — Z944 Liver transplant status: Secondary | ICD-10-CM | POA: Diagnosis not present

## 2022-12-01 DIAGNOSIS — D849 Immunodeficiency, unspecified: Secondary | ICD-10-CM | POA: Diagnosis not present

## 2022-12-04 ENCOUNTER — Other Ambulatory Visit (HOSPITAL_BASED_OUTPATIENT_CLINIC_OR_DEPARTMENT_OTHER): Payer: Self-pay

## 2022-12-04 MED ORDER — TACROLIMUS 1 MG PO CAPS
1.0000 mg | ORAL_CAPSULE | Freq: Two times a day (BID) | ORAL | 11 refills | Status: DC
  Filled 2022-12-04: qty 60, 30d supply, fill #0

## 2022-12-04 MED ORDER — PREDNISONE 5 MG PO TABS
12.5000 mg | ORAL_TABLET | Freq: Every day | ORAL | 11 refills | Status: DC
  Filled 2022-12-04 – 2022-12-08 (×2): qty 75, 30d supply, fill #0

## 2022-12-05 ENCOUNTER — Other Ambulatory Visit (HOSPITAL_COMMUNITY): Payer: Self-pay

## 2022-12-05 MED ORDER — ACAMPROSATE CALCIUM 333 MG PO TBEC
666.0000 mg | DELAYED_RELEASE_TABLET | Freq: Three times a day (TID) | ORAL | 1 refills | Status: DC
  Filled 2022-12-05: qty 180, 30d supply, fill #0
  Filled 2023-01-06: qty 180, 30d supply, fill #1

## 2022-12-08 ENCOUNTER — Other Ambulatory Visit (HOSPITAL_COMMUNITY): Payer: Self-pay

## 2022-12-08 ENCOUNTER — Other Ambulatory Visit (HOSPITAL_BASED_OUTPATIENT_CLINIC_OR_DEPARTMENT_OTHER): Payer: Self-pay

## 2022-12-08 DIAGNOSIS — Z944 Liver transplant status: Secondary | ICD-10-CM | POA: Diagnosis not present

## 2022-12-08 DIAGNOSIS — B259 Cytomegaloviral disease, unspecified: Secondary | ICD-10-CM | POA: Diagnosis not present

## 2022-12-08 DIAGNOSIS — D849 Immunodeficiency, unspecified: Secondary | ICD-10-CM | POA: Diagnosis not present

## 2022-12-08 MED ORDER — TACROLIMUS 1 MG PO CAPS
ORAL_CAPSULE | ORAL | 11 refills | Status: DC
  Filled 2022-12-08 – 2023-01-06 (×2): qty 120, 30d supply, fill #0

## 2022-12-09 ENCOUNTER — Other Ambulatory Visit: Payer: Self-pay

## 2022-12-09 ENCOUNTER — Other Ambulatory Visit (HOSPITAL_COMMUNITY): Payer: Self-pay

## 2022-12-10 ENCOUNTER — Other Ambulatory Visit (HOSPITAL_COMMUNITY): Payer: Self-pay

## 2022-12-10 ENCOUNTER — Other Ambulatory Visit: Payer: Self-pay

## 2022-12-10 MED ORDER — SULFAMETHOXAZOLE-TRIMETHOPRIM 800-160 MG PO TABS
1.0000 | ORAL_TABLET | ORAL | 0 refills | Status: DC
  Filled 2022-12-10: qty 36, 90d supply, fill #0
  Filled 2022-12-10 – 2023-01-06 (×2): qty 36, 84d supply, fill #0

## 2022-12-12 ENCOUNTER — Other Ambulatory Visit (HOSPITAL_COMMUNITY): Payer: Self-pay

## 2022-12-16 DIAGNOSIS — D849 Immunodeficiency, unspecified: Secondary | ICD-10-CM | POA: Diagnosis not present

## 2022-12-16 DIAGNOSIS — Z944 Liver transplant status: Secondary | ICD-10-CM | POA: Diagnosis not present

## 2022-12-21 ENCOUNTER — Other Ambulatory Visit (HOSPITAL_BASED_OUTPATIENT_CLINIC_OR_DEPARTMENT_OTHER): Payer: Self-pay

## 2022-12-21 MED ORDER — PREDNISONE 5 MG PO TABS
10.0000 mg | ORAL_TABLET | Freq: Every day | ORAL | 5 refills | Status: DC
  Filled 2022-12-21: qty 60, 30d supply, fill #0

## 2022-12-30 DIAGNOSIS — Z1283 Encounter for screening for malignant neoplasm of skin: Secondary | ICD-10-CM | POA: Diagnosis not present

## 2022-12-30 DIAGNOSIS — L650 Telogen effluvium: Secondary | ICD-10-CM | POA: Diagnosis not present

## 2022-12-30 DIAGNOSIS — D225 Melanocytic nevi of trunk: Secondary | ICD-10-CM | POA: Diagnosis not present

## 2022-12-30 DIAGNOSIS — L820 Inflamed seborrheic keratosis: Secondary | ICD-10-CM | POA: Diagnosis not present

## 2023-01-04 DIAGNOSIS — D849 Immunodeficiency, unspecified: Secondary | ICD-10-CM | POA: Diagnosis not present

## 2023-01-04 DIAGNOSIS — Z944 Liver transplant status: Secondary | ICD-10-CM | POA: Diagnosis not present

## 2023-01-06 ENCOUNTER — Other Ambulatory Visit (HOSPITAL_COMMUNITY): Payer: Self-pay

## 2023-01-06 MED ORDER — PREDNISONE 5 MG PO TABS
7.5000 mg | ORAL_TABLET | Freq: Every day | ORAL | 2 refills | Status: DC
  Filled 2023-01-06: qty 45, 30d supply, fill #0

## 2023-01-07 ENCOUNTER — Other Ambulatory Visit: Payer: Self-pay

## 2023-01-07 ENCOUNTER — Other Ambulatory Visit (HOSPITAL_COMMUNITY): Payer: Self-pay

## 2023-01-08 ENCOUNTER — Other Ambulatory Visit (HOSPITAL_COMMUNITY): Payer: Self-pay

## 2023-01-12 DIAGNOSIS — B259 Cytomegaloviral disease, unspecified: Secondary | ICD-10-CM | POA: Diagnosis not present

## 2023-01-12 DIAGNOSIS — F1010 Alcohol abuse, uncomplicated: Secondary | ICD-10-CM | POA: Diagnosis not present

## 2023-01-12 DIAGNOSIS — Z944 Liver transplant status: Secondary | ICD-10-CM | POA: Diagnosis not present

## 2023-01-12 DIAGNOSIS — D849 Immunodeficiency, unspecified: Secondary | ICD-10-CM | POA: Diagnosis not present

## 2023-01-14 ENCOUNTER — Other Ambulatory Visit (HOSPITAL_COMMUNITY): Payer: Self-pay

## 2023-01-14 MED ORDER — PREDNISONE 5 MG PO TABS: 5.0000 mg | ORAL_TABLET | Freq: Every day | ORAL | 2 refills | Status: DC

## 2023-01-15 ENCOUNTER — Other Ambulatory Visit (HOSPITAL_COMMUNITY): Payer: Self-pay

## 2023-01-25 DIAGNOSIS — Z944 Liver transplant status: Secondary | ICD-10-CM | POA: Diagnosis not present

## 2023-01-25 DIAGNOSIS — D849 Immunodeficiency, unspecified: Secondary | ICD-10-CM | POA: Diagnosis not present

## 2023-01-27 ENCOUNTER — Other Ambulatory Visit: Payer: Self-pay

## 2023-01-27 ENCOUNTER — Other Ambulatory Visit (HOSPITAL_COMMUNITY): Payer: Self-pay

## 2023-01-27 MED ORDER — PREDNISONE 5 MG PO TABS
2.5000 mg | ORAL_TABLET | Freq: Every day | ORAL | 2 refills | Status: DC
  Filled 2023-02-05: qty 15, 30d supply, fill #0

## 2023-01-27 MED ORDER — MYCOPHENOLATE MOFETIL 250 MG PO CAPS: 500.0000 mg | ORAL_CAPSULE | Freq: Two times a day (BID) | ORAL | 11 refills | Status: DC

## 2023-01-27 MED ORDER — TACROLIMUS 1 MG PO CAPS
3.0000 mg | ORAL_CAPSULE | Freq: Two times a day (BID) | ORAL | 11 refills | Status: DC
  Filled 2023-02-05: qty 180, 30d supply, fill #0

## 2023-01-28 ENCOUNTER — Other Ambulatory Visit (HOSPITAL_COMMUNITY): Payer: Self-pay

## 2023-02-03 DIAGNOSIS — D849 Immunodeficiency, unspecified: Secondary | ICD-10-CM | POA: Diagnosis not present

## 2023-02-03 DIAGNOSIS — Z944 Liver transplant status: Secondary | ICD-10-CM | POA: Diagnosis not present

## 2023-02-03 LAB — HM DIABETES EYE EXAM

## 2023-02-05 ENCOUNTER — Other Ambulatory Visit (HOSPITAL_COMMUNITY): Payer: Self-pay

## 2023-02-05 ENCOUNTER — Other Ambulatory Visit: Payer: Self-pay

## 2023-02-05 MED ORDER — MYCOPHENOLATE MOFETIL 250 MG PO CAPS
500.0000 mg | ORAL_CAPSULE | Freq: Every morning | ORAL | 11 refills | Status: DC
  Filled 2023-02-05: qty 90, 30d supply, fill #0

## 2023-02-10 ENCOUNTER — Other Ambulatory Visit (HOSPITAL_COMMUNITY): Payer: Self-pay

## 2023-02-11 DIAGNOSIS — Z944 Liver transplant status: Secondary | ICD-10-CM | POA: Diagnosis not present

## 2023-02-11 DIAGNOSIS — D849 Immunodeficiency, unspecified: Secondary | ICD-10-CM | POA: Diagnosis not present

## 2023-02-15 ENCOUNTER — Ambulatory Visit (INDEPENDENT_AMBULATORY_CARE_PROVIDER_SITE_OTHER): Payer: 59 | Admitting: Family Medicine

## 2023-02-15 ENCOUNTER — Encounter: Payer: Self-pay | Admitting: Family Medicine

## 2023-02-15 ENCOUNTER — Other Ambulatory Visit (HOSPITAL_COMMUNITY): Payer: Self-pay

## 2023-02-15 VITALS — BP 108/62 | HR 82 | Temp 97.6°F | Ht 61.5 in | Wt 164.0 lb

## 2023-02-15 DIAGNOSIS — Z780 Asymptomatic menopausal state: Secondary | ICD-10-CM

## 2023-02-15 DIAGNOSIS — M2550 Pain in unspecified joint: Secondary | ICD-10-CM

## 2023-02-15 DIAGNOSIS — E038 Other specified hypothyroidism: Secondary | ICD-10-CM | POA: Diagnosis not present

## 2023-02-15 DIAGNOSIS — Z944 Liver transplant status: Secondary | ICD-10-CM | POA: Diagnosis not present

## 2023-02-15 LAB — LUTEINIZING HORMONE: LH: 37.29 m[IU]/mL

## 2023-02-15 LAB — SEDIMENTATION RATE: Sed Rate: 5 mm/hr (ref 0–30)

## 2023-02-15 LAB — T4, FREE: Free T4: 0.63 ng/dL (ref 0.60–1.60)

## 2023-02-15 LAB — FOLLICLE STIMULATING HORMONE: FSH: 22 m[IU]/mL

## 2023-02-15 LAB — TSH: TSH: 1.29 u[IU]/mL (ref 0.35–5.50)

## 2023-02-15 MED ORDER — MYCOPHENOLATE MOFETIL 250 MG PO CAPS
250.0000 mg | ORAL_CAPSULE | Freq: Two times a day (BID) | ORAL | 11 refills | Status: DC
  Filled 2023-02-15: qty 60, 30d supply, fill #0

## 2023-02-15 MED ORDER — PREDNISONE 5 MG PO TABS
5.0000 mg | ORAL_TABLET | Freq: Every day | ORAL | 6 refills | Status: DC
  Filled 2023-02-15: qty 30, 30d supply, fill #0

## 2023-02-15 NOTE — Patient Instructions (Addendum)
Labs today- for auto immune joint problems   Stay active  Warm compresses are ok also  Run hands under warm water when stiff   Lab for menopause today   Lab for thyroid    Keep [place] posted

## 2023-02-15 NOTE — Assessment & Plan Note (Signed)
May be adding to joint complaints Ablation in past  Fsh and LH ordered today

## 2023-02-15 NOTE — Assessment & Plan Note (Signed)
Large and small joints in pt with liver transplant in dec No joint swelling  Is sun sensitive  Noted that both cell cept and tactolimus can have arthralgia as side effects Also going through menopause Also weaning off of prednisone  Her transplant team is aware  Today-ordered labs for auto immune joint dz

## 2023-02-15 NOTE — Progress Notes (Signed)
Subjective:    Patient ID: Jamie Coleman, female    DOB: 01-21-1971, 52 y.o.   MRN: [id number]  HPI Pt presents with aching joints  Sub clinical hypothyroid  Menopause    Wt Readings from Last 3 Encounters:  02/15/23 164 lb (74.4 kg)  09/21/22 169 lb (76.7 kg)  09/07/22 188 lb 4.4 oz (85.4 kg)   30.49 kg/m  Vitals:   02/15/23 1133  BP: 108/62  Pulse: 82  Temp: 97.6 F (36.4 C)  SpO2: 98%     Joint pain Knees Did have R knee injury years ago- drained fluid and no surgery  Hips-started hurting when she weaned pred to 10 mg  Elbows-hurt to flex Wrists- hurt to flex  Shoulders Fingers (esp thumbs) , stiff also  Toes   Mostly pain occurs with going up stairs  Can li  No muscle pain (easy fatiguabilty)   Trying to do more     No joint swelling  No rashes   No fam h/o auto imm joint disease that she knows of  Is sun sensitive (has had a little rash on arms and chest from sun)   No fever     Had liver transplant in dec 2023 On immunosup therapeutic and prednisone   More recently weaned down to 20 mg prednisone to 2.5 mg When symptoms worsened she increase to 5 mg daily     Previously on cellcept- is weaning that  Taking tacrolimus 3 mg bid    Recent labs from [facility] on 5/2 Mag of 1.5  low  (this is not new) -- mag with protein     399 mg twice daily  , this is improved  CMP noted gluc 102 and BUN 25 and ALT 62 Wbc 2.0 Tacrlimus level normal range  Normal TSH in jan   Sub clinical hypothyroid  Lost much of her hair (may be multi factorial)   Thinks she may finished with menopause  Last period was over a year ago / had ablation   ? If aches and pains are related Had some heat intolerance early on  No mood change   Patient Active Problem List   Diagnosis Date Noted   Multiple joint pain 02/15/2023   Menopause 02/15/2023   H/O liver transplant (HCC) 02/15/2023   Estrogen deficiency 09/24/2022   Acute on chronic alcoholic liver  disease (HCC) 69/62/9528   Decompensation of cirrhosis of liver (HCC) 09/22/2022   History of alcoholic hepatitis 09/22/2022   Cirrhosis, alcoholic (HCC) 09/02/2022   Macrocytosis without anemia 09/02/2022   Acute liver failure without hepatic coma 09/01/2022   Bronchitis due to COVID-19 virus 05/05/2022   Elevated liver transaminase level 09/03/2019   Subclinical hypothyroidism 09/03/2019   Vitamin D deficiency 03/02/2018   Fatigue 03/02/2018   Pupil asymmetry 08/04/2016   Hemorrhoids 12/28/2014   Prediabetes 08/28/2014   Tremor 06/30/2013   Sebaceous cyst 02/15/2013   OTHER&UNSPECIFIED DISEASES THE ORAL SOFT TISSUES 07/25/2008   DERMATOFIBROMA, ARM 11/11/2007   NEOPLASM, SKIN, UNCERTAIN BEHAVIOR 02/11/2007   Hyperlipidemia 02/10/2007   Anxiety disorder 02/10/2007   ALLERGIC RHINITIS 02/10/2007   GERD 02/10/2007   IBS 02/10/2007   Past Medical History:  Diagnosis Date   Allergy    allergic rhinitis   Anxiety    situational   COVID 05/2021   Difficult intubation    states 2012 ablation surgery, had diff intubation,glide scope used, not noted in EPIC anesthesia record   Elevated hemoglobin A1c 08/08/2015   Elevated  liver function tests 08/08/2015   GERD (gastroesophageal reflux disease)    Hyperlipidemia    Low serum vitamin D 08/08/2015   Status post endometrial ablation    Tennis elbow    currently in PT-10/16   Past Surgical History:  Procedure Laterality Date   BIOPSY  02/09/2022   Procedure: BIOPSY;  Surgeon: Meridee Score Netty Starring., MD;  Location: [facility];  Service: Gastroenterology;;   CESAREAN SECTION     COLONOSCOPY WITH PROPOFOL N/A 02/09/2022   Procedure: COLONOSCOPY WITH PROPOFOL;  Surgeon: Lemar Lofty., MD;  Location: [facility];  Service: Gastroenterology;  Laterality: N/A;   ENDOMETRIAL ABLATION     8/12   ESOPHAGOGASTRODUODENOSCOPY (EGD) WITH PROPOFOL N/A 02/09/2022   Procedure: ESOPHAGOGASTRODUODENOSCOPY (EGD) WITH PROPOFOL;   Surgeon: Meridee Score Netty Starring., MD;  Location: [facility];  Service: Gastroenterology;  Laterality: N/A;   EXTREMITY WIRE/PIN REMOVAL Left 07/16/2016   Procedure: PIN REMOVAL OF LEFT WRIST X 2;  Surgeon: Dominica Severin, MD;  Location: [facility];  Service: Orthopedics;  Laterality: Left;   FOOT SURGERY Left    LAPAROSCOPIC TUBAL LIGATION  06/09/2011   Procedure: LAPAROSCOPIC TUBAL LIGATION;  Surgeon: Melony Overly;  Location: [facility];  Service: Gynecology;  Laterality: N/A;   LIGAMENT REPAIR Left 05/08/2016   Procedure: LEFT WRIST SCAPHOLUNATE LIGAMENT RECONSTRUCTION WITH TENDON GRAFT PIN NEURECTOMY AND REPAIR;  Surgeon: Dominica Severin, MD;  Location: [facility];  Service: Orthopedics;  Laterality: Left;   LIVER TRANSPLANT  09/2022   POLYPECTOMY  02/09/2022   Procedure: POLYPECTOMY;  Surgeon: Mansouraty, Netty Starring., MD;  Location: [facility];  Service: Gastroenterology;;   TONSILLECTOMY     as child   Social History   Tobacco Use   Smoking status: Former    Types: Cigarettes    Quit date: 07/25/1983    Years since quitting: 39.5   Smokeless tobacco: Never   Tobacco comments:    in high school  Vaping Use   Vaping Use: Never used  Substance Use Topics   Alcohol use: Not Currently    Alcohol/week: 14.0 standard drinks of alcohol    Types: 14 Glasses of wine per week    Comment: 6 shots daily 12 shots daily on weekends   Drug use: No   Family History  Problem Relation Age of Onset   Cancer Mother        [place] insitu of appendix   Heart disease Father        CAD   Diabetes Father        type II   Alzheimer's disease Father    Colon polyps Father    Diabetes Brother        type II   Diabetes Maternal Grandfather    Diabetes Paternal Grandmother    Diabetes Paternal Grandfather    Breast cancer Neg Hx    Colon cancer Neg Hx    Stomach cancer Neg Hx    Esophageal cancer Neg Hx    Pancreatic cancer Neg Hx    Allergies  Allergen Reactions    Lipitor [Atorvastatin] Other (See Comments)    myalgias   Current Outpatient Medications on File Prior to Visit  Medication Sig Dispense Refill   Calcium Citrate-Vitamin D 315-5 MG-MCG TABS Take by mouth.     omeprazole (PRILOSEC) 20 MG capsule Take 20 mg by mouth daily.     Polyvinyl Alcohol-Povidone (REFRESH OP) Place 1-2 drops into both eyes 4 (four) times daily as needed (dry eye).  Specialty Vitamins Products (MG PLUS PROTEIN) 133 MG TABS Take by mouth.     tacrolimus (PROGRAF) 1 MG capsule Take 3 capsules (3 mg total) by mouth every 12 (twelve) hours. 180 capsule 11   valGANciclovir (VALCYTE) 450 MG tablet Take 1 tablet (450 mg total) by mouth daily with supper. 30 tablet 4   ALPRAZolam (XANAX) 0.5 MG tablet Take 1 tablet (0.5 mg total) by mouth 2 (two) times daily as needed for anxiety. (Patient not taking: Reported on 02/15/2023) 30 tablet 0   No current facility-administered medications on file prior to visit.    Review of Systems  Constitutional:  Negative for activity change, appetite change, fatigue, fever and unexpected weight change.  HENT:  Negative for congestion, ear pain, rhinorrhea, sinus pressure and sore throat.   Eyes:  Negative for pain, redness and visual disturbance.  Respiratory:  Negative for cough, shortness of breath and wheezing.   Cardiovascular:  Negative for chest pain and palpitations.  Gastrointestinal:  Negative for abdominal pain, blood in stool, constipation and diarrhea.  Endocrine: Negative for polydipsia and polyuria.  Genitourinary:  Negative for dysuria, frequency and urgency.  Musculoskeletal:  Positive for arthralgias. Negative for back pain, gait problem, joint swelling, myalgias, neck pain and neck stiffness.  Skin:  Negative for pallor and rash.  Allergic/Immunologic: Negative for environmental allergies.  Neurological:  Negative for dizziness, syncope and headaches.  Hematological:  Negative for adenopathy. Does not bruise/bleed easily.   Psychiatric/Behavioral:  Negative for decreased concentration and dysphoric mood. The patient is not nervous/anxious.        Objective:   Physical Exam Constitutional:      General: She is not in acute distress.    Appearance: Normal appearance. She is well-developed. She is obese. She is not ill-appearing or diaphoretic.  HENT:     Head: Normocephalic and atraumatic.  Eyes:     General: No scleral icterus.    Conjunctiva/sclera: Conjunctivae normal.     Pupils: Pupils are equal, round, and reactive to light.  Neck:     Thyroid: No thyromegaly.     Vascular: No carotid bruit or JVD.  Cardiovascular:     Rate and Rhythm: Normal rate and regular rhythm.     Heart sounds: Normal heart sounds.     No gallop.  Pulmonary:     Effort: Pulmonary effort is normal. No respiratory distress.     Breath sounds: Normal breath sounds. No wheezing or rales.  Abdominal:     General: There is no distension or abdominal bruit.     Palpations: Abdomen is soft. There is no mass.     Tenderness: There is no abdominal tenderness. There is no guarding or rebound.     Hernia: No hernia is present.     Comments: Healed scar noted   Musculoskeletal:     Cervical back: Normal range of motion and neck supple.     Right lower leg: No edema.     Left lower leg: No edema.     Comments: No acute joint swelling No acute joint tenderness  Normal rom joints  Pt notes elbow pain on full flexion  Lymphadenopathy:     Cervical: No cervical adenopathy.  Skin:    General: Skin is warm and dry.     Coloration: Skin is not pale.     Findings: No rash.     Comments: No longer jaundiced   Neurological:     Mental Status: She is alert.  Coordination: Coordination normal.     Deep Tendon Reflexes: Reflexes are normal and symmetric. Reflexes normal.  Psychiatric:        Mood and Affect: Mood normal.           Assessment & Plan:   Problem List Items Addressed This Visit       Endocrine    Subclinical hypothyroidism    Some more joint pain recently  Also s/p liver transplant   Not overly fatigued TSH and FT4 ordered      Relevant Orders   TSH   T4, free   ANA,IFA RA Diag Pnl w/rflx Tit/Patn     Other   H/O liver transplant (HCC)    Doing well  No further alcohol   On tacrolimus and prednisone and cellcept  Reviewed last notes and labs from [facility] (see note)  ALT was last 62       Menopause    May be adding to joint complaints Ablation in past  Fsh and LH ordered today      Relevant Orders   Follicle Stimulating Hormone   Luteinizing hormone   ANA,IFA RA Diag Pnl w/rflx Tit/Patn   Multiple joint pain - Primary    Large and small joints in pt with liver transplant in dec No joint swelling  Is sun sensitive  Noted that both cell cept and tactolimus can have arthralgia as side effects Also going through menopause Also weaning off of prednisone  Her transplant team is aware  Today-ordered labs for auto immune joint dz      Relevant Orders   Sedimentation Rate   Rheumatoid factor   TSH   ANA,IFA RA Diag Pnl w/rflx Tit/Patn

## 2023-02-15 NOTE — Assessment & Plan Note (Signed)
Some more joint pain recently  Also s/p liver transplant   Not overly fatigued TSH and FT4 ordered

## 2023-02-15 NOTE — Assessment & Plan Note (Signed)
Doing well  No further alcohol   On tacrolimus and prednisone and cellcept  Reviewed last notes and labs from [facility] (see note)  ALT was last 62

## 2023-02-16 ENCOUNTER — Other Ambulatory Visit (HOSPITAL_COMMUNITY): Payer: Self-pay

## 2023-02-16 LAB — RHEUMATOID FACTOR: Rheumatoid fact SerPl-aCnc: <10 IU/mL (ref <14)

## 2023-02-17 LAB — ANA,IFA RA DIAG PNL W/RFLX TIT/PATN
Anti Nuclear Antibody (ANA): NEGATIVE
Cyclic Citrullin Peptide Ab: <16 UNITS
Rheumatoid fact SerPl-aCnc: <10 IU/mL (ref <14)

## 2023-02-19 ENCOUNTER — Encounter: Payer: Self-pay | Admitting: Family Medicine

## 2023-02-22 DIAGNOSIS — Z944 Liver transplant status: Secondary | ICD-10-CM | POA: Diagnosis not present

## 2023-02-22 DIAGNOSIS — B259 Cytomegaloviral disease, unspecified: Secondary | ICD-10-CM | POA: Diagnosis not present

## 2023-02-22 DIAGNOSIS — F1011 Alcohol abuse, in remission: Secondary | ICD-10-CM | POA: Diagnosis not present

## 2023-02-22 DIAGNOSIS — D849 Immunodeficiency, unspecified: Secondary | ICD-10-CM | POA: Diagnosis not present

## 2023-02-22 DIAGNOSIS — F1010 Alcohol abuse, uncomplicated: Secondary | ICD-10-CM | POA: Diagnosis not present

## 2023-02-23 ENCOUNTER — Other Ambulatory Visit (HOSPITAL_COMMUNITY): Payer: Self-pay

## 2023-02-23 MED ORDER — PREDNISONE 5 MG PO TABS
10.0000 mg | ORAL_TABLET | Freq: Every day | ORAL | 11 refills | Status: DC
  Filled 2023-02-23: qty 60, 30d supply, fill #0

## 2023-02-26 DIAGNOSIS — T8641 Liver transplant rejection: Secondary | ICD-10-CM | POA: Diagnosis not present

## 2023-02-26 DIAGNOSIS — D84821 Immunodeficiency due to drugs: Secondary | ICD-10-CM | POA: Diagnosis not present

## 2023-02-26 DIAGNOSIS — M791 Myalgia, unspecified site: Secondary | ICD-10-CM | POA: Diagnosis not present

## 2023-02-26 DIAGNOSIS — Z888 Allergy status to other drugs, medicaments and biological substances status: Secondary | ICD-10-CM | POA: Diagnosis not present

## 2023-02-26 DIAGNOSIS — D849 Immunodeficiency, unspecified: Secondary | ICD-10-CM | POA: Diagnosis not present

## 2023-02-26 DIAGNOSIS — Z7960 Long term (current) use of unspecified immunomodulators and immunosuppressants: Secondary | ICD-10-CM | POA: Diagnosis not present

## 2023-02-26 DIAGNOSIS — R739 Hyperglycemia, unspecified: Secondary | ICD-10-CM | POA: Diagnosis not present

## 2023-02-26 DIAGNOSIS — K7030 Alcoholic cirrhosis of liver without ascites: Secondary | ICD-10-CM | POA: Diagnosis not present

## 2023-02-26 DIAGNOSIS — R079 Chest pain, unspecified: Secondary | ICD-10-CM | POA: Diagnosis not present

## 2023-02-26 DIAGNOSIS — T8649 Other complications of liver transplant: Secondary | ICD-10-CM | POA: Diagnosis not present

## 2023-02-26 DIAGNOSIS — T380X5A Adverse effect of glucocorticoids and synthetic analogues, initial encounter: Secondary | ICD-10-CM | POA: Diagnosis not present

## 2023-02-26 DIAGNOSIS — Z87891 Personal history of nicotine dependence: Secondary | ICD-10-CM | POA: Diagnosis not present

## 2023-02-26 DIAGNOSIS — D702 Other drug-induced agranulocytosis: Secondary | ICD-10-CM | POA: Diagnosis not present

## 2023-02-26 DIAGNOSIS — Z79899 Other long term (current) drug therapy: Secondary | ICD-10-CM | POA: Diagnosis not present

## 2023-02-26 DIAGNOSIS — K7581 Nonalcoholic steatohepatitis (NASH): Secondary | ICD-10-CM | POA: Diagnosis not present

## 2023-02-26 DIAGNOSIS — Z944 Liver transplant status: Secondary | ICD-10-CM | POA: Diagnosis not present

## 2023-02-26 DIAGNOSIS — Z7982 Long term (current) use of aspirin: Secondary | ICD-10-CM | POA: Diagnosis not present

## 2023-02-26 DIAGNOSIS — Z5181 Encounter for therapeutic drug level monitoring: Secondary | ICD-10-CM | POA: Diagnosis not present

## 2023-02-26 DIAGNOSIS — R7989 Other specified abnormal findings of blood chemistry: Secondary | ICD-10-CM | POA: Diagnosis not present

## 2023-02-26 DIAGNOSIS — R0789 Other chest pain: Secondary | ICD-10-CM | POA: Diagnosis not present

## 2023-03-01 ENCOUNTER — Other Ambulatory Visit (HOSPITAL_COMMUNITY): Payer: Self-pay

## 2023-03-02 ENCOUNTER — Other Ambulatory Visit (HOSPITAL_COMMUNITY): Payer: Self-pay

## 2023-03-02 MED ORDER — TACROLIMUS 1 MG PO CAPS
4.0000 mg | ORAL_CAPSULE | Freq: Two times a day (BID) | ORAL | 11 refills | Status: DC
  Filled 2023-03-02: qty 240, 30d supply, fill #0

## 2023-03-02 MED ORDER — SULFAMETHOXAZOLE-TRIMETHOPRIM 800-160 MG PO TABS
ORAL_TABLET | ORAL | 2 refills | Status: DC
  Filled 2023-04-22: qty 12, 28d supply, fill #0

## 2023-03-02 MED ORDER — PREDNISONE 5 MG PO TABS
40.0000 mg | ORAL_TABLET | Freq: Every day | ORAL | 2 refills | Status: DC
  Filled 2023-03-02 – 2023-03-04 (×2): qty 60, 7d supply, fill #0

## 2023-03-02 MED ORDER — MYCOPHENOLATE MOFETIL 250 MG PO CAPS
750.0000 mg | ORAL_CAPSULE | Freq: Two times a day (BID) | ORAL | 11 refills | Status: DC
  Filled 2023-03-02: qty 180, 30d supply, fill #0

## 2023-03-02 MED ORDER — GABAPENTIN 100 MG PO CAPS
100.0000 mg | ORAL_CAPSULE | Freq: Two times a day (BID) | ORAL | 0 refills | Status: DC
  Filled 2023-03-02: qty 60, 30d supply, fill #0

## 2023-03-02 MED ORDER — VALGANCICLOVIR HCL 450 MG PO TABS
900.0000 mg | ORAL_TABLET | Freq: Every day | ORAL | 1 refills | Status: DC
  Filled 2023-03-02: qty 60, 30d supply, fill #0

## 2023-03-03 ENCOUNTER — Telehealth: Payer: Self-pay

## 2023-03-03 NOTE — Transitions of Care (Post Inpatient/ED Visit) (Signed)
03/03/2023  Name: Jamie Coleman MRN: [id number] DOB: 1971/09/28  Today's TOC FU Call Status: Today's TOC FU Call Status:: Successful TOC FU Call Competed TOC FU Call Complete Date: 03/03/23  Transition Care Management Follow-up Telephone Call Date of Discharge: 03/02/23 Discharge Facility: Other ([facility]) Name of Other ([facility]) [facility] Facility: [facility] Type of Discharge: Inpatient Admission Primary Inpatient Discharge Diagnosis:: Chest pain, unspecified type How have you been since you were released from the hospital?: Better Any questions or concerns?: Yes Patient Questions/Concerns:: pt is requesting a bone density be ordered, states she has not had one this year and hospital recommended. Patient Questions/Concerns Addressed: Notified Provider of Patient Questions/Concerns  Items Reviewed: Did you receive and understand the discharge instructions provided?: Yes Medications obtained,verified, and reconciled?: Yes (Medications Reviewed) Any new allergies since your discharge?: No Dietary orders reviewed?: NA Do you have support at home?: Yes  Medications Reviewed Today: Medications Reviewed Today     Reviewed by Leigh Aurora, CMA (Certified Medical Assistant) on 03/03/23 at 1345  Med List Status: <None>   Medication Order Taking? Sig Documenting Provider Last Dose Status Informant  ALPRAZolam (XANAX) 0.5 MG tablet [id number] No Take 1 tablet (0.5 mg total) by mouth 2 (two) times daily as needed for anxiety.  Patient not taking: Reported on 02/15/2023   Judy Pimple, MD Not Taking Active   Calcium Citrate-Vitamin D 315-5 MG-MCG TABS [id number] No Take by mouth. [provider] Taking Active   gabapentin (NEURONTIN) 100 MG capsule [id number]  Take 1 capsule (100 mg total) by mouth 2 (two) times daily for 30 days   Active   mycophenolate (CELLCEPT) 250 MG capsule [id number]  Take 3 capsules (750 mg total) by mouth 2 (two) times  daily.   Active   omeprazole (PRILOSEC) 20 MG capsule [id number] No Take 20 mg by mouth daily. [provider] Taking Active Self, Spouse/Significant Other  Polyvinyl Alcohol-Povidone (REFRESH OP) [id number] No Place 1-2 drops into both eyes 4 (four) times daily as needed (dry eye). [provider] Taking Active Self, Spouse/Significant Other  predniSONE (DELTASONE) 5 MG tablet [id number]  Take 8 tablets (40 mg total) by mouth daily.   Active   Specialty Vitamins Products (MG PLUS PROTEIN) 133 MG TABS [id number] No Take by mouth. [provider] Taking Active   sulfamethoxazole-trimethoprim (BACTRIM DS) 800-160 MG tablet [id number]  Take 1 tablet by mouth every Monday, Wednesday and Friday for 90 days.   Active   tacrolimus (PROGRAF) 1 MG capsule [id number]  Take 4 capsules (4 mg total) by mouth every 12 (twelve) hours.   Active   valGANciclovir (VALCYTE) 450 MG tablet [id number]  Take 2 tablets (900 mg total) by mouth daily with dinner.   Active             Home Care and Equipment/Supplies: Were Home Health Services Ordered?: NA Any new equipment or medical supplies ordered?: NA  Functional Questionnaire: Do you need assistance with bathing/showering or dressing?: No Do you need assistance with meal preparation?: No Do you need assistance with eating?: No Do you have difficulty maintaining continence: No Do you need assistance with getting out of bed/getting out of a chair/moving?: No Do you have difficulty managing or taking your medications?: No  Follow up appointments reviewed: PCP Follow-up appointment confirmed?: No (pt declined, will call back if she changes her mind) MD Provider Line [phone] Given: Yes Specialist Hospital Follow-up appointment confirmed?: Yes Date of Specialist follow-up appointment?: 03/04/23 Follow-Up Specialty  Provider:: [facility] for follow up labs Do you need transportation to your follow-up appointment?:  No Do you understand care options if your condition(s) worsen?: Yes-patient verbalized understanding    SIGNATURE Agnes Lawrence, CMA (AAMA)  [facility] [phone]

## 2023-03-04 ENCOUNTER — Other Ambulatory Visit (HOSPITAL_COMMUNITY): Payer: Self-pay

## 2023-03-04 DIAGNOSIS — D849 Immunodeficiency, unspecified: Secondary | ICD-10-CM | POA: Diagnosis not present

## 2023-03-04 DIAGNOSIS — Z944 Liver transplant status: Secondary | ICD-10-CM | POA: Diagnosis not present

## 2023-03-05 ENCOUNTER — Other Ambulatory Visit: Payer: Self-pay

## 2023-03-05 ENCOUNTER — Other Ambulatory Visit (HOSPITAL_COMMUNITY): Payer: Self-pay

## 2023-03-05 MED ORDER — TACROLIMUS 1 MG PO CAPS
ORAL_CAPSULE | ORAL | 11 refills | Status: DC
  Filled 2023-03-05: qty 270, 30d supply, fill #0

## 2023-03-10 DIAGNOSIS — D849 Immunodeficiency, unspecified: Secondary | ICD-10-CM | POA: Diagnosis not present

## 2023-03-10 DIAGNOSIS — Z944 Liver transplant status: Secondary | ICD-10-CM | POA: Diagnosis not present

## 2023-03-11 ENCOUNTER — Other Ambulatory Visit (HOSPITAL_COMMUNITY): Payer: Self-pay

## 2023-03-11 MED ORDER — PREDNISONE 5 MG PO TABS
40.0000 mg | ORAL_TABLET | Freq: Every day | ORAL | 6 refills | Status: DC
  Filled 2023-03-11: qty 240, 30d supply, fill #0

## 2023-03-16 DIAGNOSIS — D849 Immunodeficiency, unspecified: Secondary | ICD-10-CM | POA: Diagnosis not present

## 2023-03-16 DIAGNOSIS — Z944 Liver transplant status: Secondary | ICD-10-CM | POA: Diagnosis not present

## 2023-03-17 ENCOUNTER — Other Ambulatory Visit (HOSPITAL_COMMUNITY): Payer: Self-pay

## 2023-03-17 MED ORDER — PREVYMIS 480 MG PO TABS
480.0000 mg | ORAL_TABLET | Freq: Every day | ORAL | 3 refills | Status: DC
  Filled 2023-03-17: qty 28, 28d supply, fill #0

## 2023-03-17 MED ORDER — TACROLIMUS 1 MG PO CAPS
ORAL_CAPSULE | ORAL | 11 refills | Status: DC
  Filled 2023-03-17: qty 210, 30d supply, fill #0

## 2023-03-18 ENCOUNTER — Other Ambulatory Visit (HOSPITAL_COMMUNITY): Payer: Self-pay

## 2023-03-20 ENCOUNTER — Other Ambulatory Visit (HOSPITAL_COMMUNITY): Payer: Self-pay

## 2023-03-24 ENCOUNTER — Other Ambulatory Visit (HOSPITAL_COMMUNITY): Payer: Self-pay

## 2023-03-24 DIAGNOSIS — F1010 Alcohol abuse, uncomplicated: Secondary | ICD-10-CM | POA: Diagnosis not present

## 2023-03-24 DIAGNOSIS — Z944 Liver transplant status: Secondary | ICD-10-CM | POA: Diagnosis not present

## 2023-03-24 DIAGNOSIS — F1011 Alcohol abuse, in remission: Secondary | ICD-10-CM | POA: Diagnosis not present

## 2023-03-24 DIAGNOSIS — B259 Cytomegaloviral disease, unspecified: Secondary | ICD-10-CM | POA: Diagnosis not present

## 2023-03-24 DIAGNOSIS — D849 Immunodeficiency, unspecified: Secondary | ICD-10-CM | POA: Diagnosis not present

## 2023-03-25 ENCOUNTER — Other Ambulatory Visit (HOSPITAL_COMMUNITY): Payer: Self-pay

## 2023-03-25 MED ORDER — PREDNISONE 5 MG PO TABS
35.0000 mg | ORAL_TABLET | Freq: Every day | ORAL | 11 refills | Status: DC
  Filled 2023-03-25: qty 210, 30d supply, fill #0

## 2023-03-25 MED ORDER — TACROLIMUS 1 MG PO CAPS
2.0000 mg | ORAL_CAPSULE | Freq: Two times a day (BID) | ORAL | 11 refills | Status: DC
  Filled 2023-03-25 – 2023-04-09 (×2): qty 120, 30d supply, fill #0

## 2023-03-27 ENCOUNTER — Other Ambulatory Visit (HOSPITAL_COMMUNITY): Payer: Self-pay

## 2023-04-01 ENCOUNTER — Other Ambulatory Visit (HOSPITAL_COMMUNITY): Payer: Self-pay

## 2023-04-01 DIAGNOSIS — Z944 Liver transplant status: Secondary | ICD-10-CM | POA: Diagnosis not present

## 2023-04-01 DIAGNOSIS — D849 Immunodeficiency, unspecified: Secondary | ICD-10-CM | POA: Diagnosis not present

## 2023-04-01 DIAGNOSIS — B259 Cytomegaloviral disease, unspecified: Secondary | ICD-10-CM | POA: Diagnosis not present

## 2023-04-01 MED ORDER — PREDNISONE 5 MG PO TABS: 30.0000 mg | ORAL_TABLET | Freq: Every day | ORAL | 11 refills | Status: DC

## 2023-04-02 ENCOUNTER — Other Ambulatory Visit (HOSPITAL_COMMUNITY): Payer: Self-pay

## 2023-04-06 ENCOUNTER — Other Ambulatory Visit (HOSPITAL_COMMUNITY): Payer: Self-pay

## 2023-04-06 MED ORDER — PREDNISONE 5 MG PO TABS
ORAL_TABLET | ORAL | 11 refills | Status: DC
  Filled 2023-04-06: qty 150, 30d supply, fill #0

## 2023-04-07 ENCOUNTER — Other Ambulatory Visit (HOSPITAL_COMMUNITY): Payer: Self-pay

## 2023-04-09 ENCOUNTER — Other Ambulatory Visit: Payer: Self-pay

## 2023-04-09 ENCOUNTER — Other Ambulatory Visit (HOSPITAL_COMMUNITY): Payer: Self-pay

## 2023-04-09 MED ORDER — MYCOPHENOLATE MOFETIL 250 MG PO CAPS
1000.0000 mg | ORAL_CAPSULE | Freq: Two times a day (BID) | ORAL | 11 refills | Status: DC
  Filled 2023-04-09: qty 240, 30d supply, fill #0
  Filled 2023-05-07: qty 240, 30d supply, fill #1
  Filled 2023-06-01: qty 240, 30d supply, fill #2

## 2023-04-10 ENCOUNTER — Other Ambulatory Visit (HOSPITAL_COMMUNITY): Payer: Self-pay

## 2023-04-13 ENCOUNTER — Other Ambulatory Visit (HOSPITAL_COMMUNITY): Payer: Self-pay

## 2023-04-20 DIAGNOSIS — Z944 Liver transplant status: Secondary | ICD-10-CM | POA: Diagnosis not present

## 2023-04-20 DIAGNOSIS — B259 Cytomegaloviral disease, unspecified: Secondary | ICD-10-CM | POA: Diagnosis not present

## 2023-04-20 DIAGNOSIS — D849 Immunodeficiency, unspecified: Secondary | ICD-10-CM | POA: Diagnosis not present

## 2023-04-21 ENCOUNTER — Other Ambulatory Visit (HOSPITAL_COMMUNITY): Payer: Self-pay

## 2023-04-21 MED ORDER — PREDNISONE 5 MG PO TABS
20.0000 mg | ORAL_TABLET | Freq: Every day | ORAL | 11 refills | Status: DC
  Filled 2023-04-21: qty 120, 120d supply, fill #0

## 2023-04-23 ENCOUNTER — Other Ambulatory Visit (HOSPITAL_COMMUNITY): Payer: Self-pay

## 2023-04-24 ENCOUNTER — Other Ambulatory Visit (HOSPITAL_COMMUNITY): Payer: Self-pay

## 2023-04-26 ENCOUNTER — Other Ambulatory Visit (HOSPITAL_COMMUNITY): Payer: Self-pay

## 2023-04-28 ENCOUNTER — Other Ambulatory Visit (HOSPITAL_COMMUNITY): Payer: Self-pay

## 2023-04-28 DIAGNOSIS — B259 Cytomegaloviral disease, unspecified: Secondary | ICD-10-CM | POA: Diagnosis not present

## 2023-04-28 DIAGNOSIS — Z944 Liver transplant status: Secondary | ICD-10-CM | POA: Diagnosis not present

## 2023-04-28 DIAGNOSIS — T380X5A Adverse effect of glucocorticoids and synthetic analogues, initial encounter: Secondary | ICD-10-CM | POA: Diagnosis not present

## 2023-04-28 DIAGNOSIS — F1010 Alcohol abuse, uncomplicated: Secondary | ICD-10-CM | POA: Diagnosis not present

## 2023-04-28 DIAGNOSIS — R739 Hyperglycemia, unspecified: Secondary | ICD-10-CM | POA: Diagnosis not present

## 2023-04-28 DIAGNOSIS — D849 Immunodeficiency, unspecified: Secondary | ICD-10-CM | POA: Diagnosis not present

## 2023-04-28 MED ORDER — FAMOTIDINE 40 MG PO TABS
40.0000 mg | ORAL_TABLET | Freq: Every day | ORAL | 11 refills | Status: DC
  Filled 2023-04-28 – 2023-05-07 (×2): qty 30, 30d supply, fill #0
  Filled 2023-06-01: qty 30, 30d supply, fill #1
  Filled 2023-07-04: qty 30, 30d supply, fill #2

## 2023-04-29 ENCOUNTER — Encounter: Payer: Self-pay | Admitting: Family Medicine

## 2023-04-29 ENCOUNTER — Other Ambulatory Visit (HOSPITAL_COMMUNITY): Payer: Self-pay

## 2023-04-29 ENCOUNTER — Ambulatory Visit: Payer: 59 | Admitting: Family Medicine

## 2023-04-29 VITALS — BP 114/62 | HR 82 | Temp 97.6°F | Ht 61.5 in | Wt 178.2 lb

## 2023-04-29 DIAGNOSIS — T380X5A Adverse effect of glucocorticoids and synthetic analogues, initial encounter: Secondary | ICD-10-CM | POA: Diagnosis not present

## 2023-04-29 DIAGNOSIS — R198 Other specified symptoms and signs involving the digestive system and abdomen: Secondary | ICD-10-CM | POA: Diagnosis not present

## 2023-04-29 DIAGNOSIS — E2839 Other primary ovarian failure: Secondary | ICD-10-CM

## 2023-04-29 DIAGNOSIS — E099 Drug or chemical induced diabetes mellitus without complications: Secondary | ICD-10-CM

## 2023-04-29 DIAGNOSIS — Z7952 Long term (current) use of systemic steroids: Secondary | ICD-10-CM | POA: Diagnosis not present

## 2023-04-29 DIAGNOSIS — Z944 Liver transplant status: Secondary | ICD-10-CM | POA: Diagnosis not present

## 2023-04-29 DIAGNOSIS — R7303 Prediabetes: Secondary | ICD-10-CM

## 2023-04-29 LAB — POCT GLYCOSYLATED HEMOGLOBIN (HGB A1C): Hemoglobin A1C: 6.9 % — AB (ref 4.0–5.6)

## 2023-04-29 MED ORDER — PREDNISONE 5 MG PO TABS
15.0000 mg | ORAL_TABLET | Freq: Every day | ORAL | 11 refills | Status: DC
  Filled 2023-04-29 – 2023-05-01 (×2): qty 90, 30d supply, fill #0

## 2023-04-29 MED ORDER — TACROLIMUS 1 MG PO CAPS
3.0000 mg | ORAL_CAPSULE | Freq: Two times a day (BID) | ORAL | 11 refills | Status: DC
  Filled 2023-04-29 – 2023-05-03 (×2): qty 180, 30d supply, fill #0
  Filled 2023-06-01: qty 180, 30d supply, fill #1
  Filled 2023-07-04: qty 180, 30d supply, fill #2

## 2023-04-29 NOTE — Assessment & Plan Note (Signed)
Since her liver transplant  Interested in PT to safely start some exercise to build strength Reassuring exam today  Referral done

## 2023-04-29 NOTE — Assessment & Plan Note (Signed)
Dexa ordered  Also on prednisone

## 2023-04-29 NOTE — Assessment & Plan Note (Signed)
Reviewed recent labs and notes from [facility] (yesterday) Overall doing well and stable   Gradually coming down on prednisone  Also continues mycophenolate and tacrolimus  Is compliant with meds and program No alcohol Mood is positive

## 2023-04-29 NOTE — Patient Instructions (Addendum)
Sliding scale for humalog   For glucose of  180-200  one unit  201 to 250- two units 251-300 -three units  301-350- -four units  351-400 five units   Monitor carefully for low sugar  Try to get most of your carbohydrates from produce (with the exception of white potatoes)  Eat less bread/pasta/rice/snack foods/cereals/sweets and other items from the middle of the grocery store (processed carbs)   Add some strength training to your routine, this is important for bone and brain health and can reduce your risk of falls and help your body use insulin properly and regulate weight  Light weights, exercise bands , and internet videos are a good way to start  Yoga (chair or regular), machines , floor exercises or a gym with machines are also good options   I put the referral in for PT  Please let us know if you don't hear in 1-2 weeks   You have an order for:  []   2D Mammogram  []   3D Mammogram  [x]   Bone Density     Please call for appointment:   []   [facility]  [address] [place] [address]  [phone]  []   [facility])   [address]  [place], [place] [address]  [phone]  []   [facility]      [address] [place], [place]        [phone]         []   [facility]  [address] [place], [place]  [phone]  [x]  [facility]   [address]   [place], [place] [address]  [phone]  []  [facility]  [address] [place], [place] [address] [phone]    Make sure to wear two piece clothing  No lotions powders or deodorants the day of the appointment Make sure to bring picture ID and insurance card.  Bring list of medications you are currently taking including any supplements.   Schedule your  screening mammogram through MyChart!   Select [facility] imaging sites can now be scheduled through MyChart.  Log into your MyChart account.  Go to 'Visit' (or 'Appointments' if  on mobile App) --> Schedule an  Appointment  Under 'Select a Reason for Visit' choose the Mammogram  Screening option.  Complete the pre-visit questions  and select the time and place that  best fits your schedule

## 2023-04-29 NOTE — Assessment & Plan Note (Signed)
Taking prednisone 15 mg now- recently dec from 20 and expects to be on it for about 3 more months with gradually dec dose Has weight gain/moon face / cushing's appearance  No symptoms from her hyperglycemia however and frustrated that diet and exercise do not seem to affect her glucose levels Lab Results  Component Value Date   HGBA1C 6.9 (A) 04/29/2023   Reviewed glucose and diet log today Proposed short term metformin-pt prefers not to  Will adjust her SSI (see AVS) by 1 unit - starting at glucose of 180 instead of 200  Will watch closely for hypoglycemia Discussed lower glycemic balanced diet  Discussed adding strength training as well  Plan follow up 3 months

## 2023-04-29 NOTE — Progress Notes (Signed)
Subjective:    Patient ID: Jamie Coleman, female    DOB: 03-22-1971, 52 y.o.   MRN: [id number]  HPI  Wt Readings from Last 3 Encounters:  04/29/23 178 lb 4 oz (80.9 kg)  02/15/23 164 lb (74.4 kg)  09/21/22 169 lb (76.7 kg)   33.13 kg/m  Vitals:   04/29/23 0934  BP: 114/62  Pulse: 82  Temp: 97.6 F (36.4 C)  SpO2: 98%   Pr presents for concerns about blood sugar   Doing ok  Fairly stable with everything     Takes prednisone since her liver transplant (to prevent rejection)  Is on 15 mg daily currently  Just went down from 20 mg   Worried about steroid induced hyperglycemia   Glucose is usually 90-one teens in am  Then takes med at 10  Then glucose is in upper 100s whether she eats or not (lunch time)  - between 11 and 1   Eats later usually  No certain foods make it go up    At dinner time- almost always in 200s  Takes 1-2 at dinner time  Follows a sliding scale   Bed time glucose is quite labile   mid 100s to mid 200s    Does not treat at bedtime unless over 300   Continues to go down on prednisone  Will be most likely 3 months before coming off of it    Lab Results  Component Value Date   HGBA1C 6.9 (A) 04/29/2023   Does not have symptoms when blood sugar is high   Has tried eating better-does not change her sugar levels Has tried exercising (walking 3 miles at park for instance)   Would like to play around with her sliding scale   Does not have muscle mass /esp in abdomen since her surgery     Also cellcept 1000 mg bid and tacrolimus 2 mg bid   Needs to schedule a bone density test    Lab Results  Component Value Date   HGBA1C 6.9 (A) 04/29/2023   Per labs at [facility] from yesterday  Wbc 8.7 Hb 13.5 Cr 1.0  Glucose 129 K of 4.1 Normal ast and alt and bili and albumin GFR 67 Mag of 1.6 (mildly low)      Patient Active Problem List   Diagnosis Date Noted   On prednisone therapy 04/29/2023   Abdominal weakness 04/29/2023    Multiple joint pain 02/15/2023   Menopause 02/15/2023   H/O liver transplant (HCC) 02/15/2023   Estrogen deficiency 09/24/2022   Acute on chronic alcoholic liver disease (HCC) 09/22/2022   Decompensation of cirrhosis of liver (HCC) 09/22/2022   History of alcoholic hepatitis 09/22/2022   Cirrhosis, alcoholic (HCC) 09/02/2022   Macrocytosis without anemia 09/02/2022   Acute liver failure without hepatic coma 09/01/2022   Bronchitis due to COVID-19 virus 05/05/2022   Elevated liver transaminase level 09/03/2019   Subclinical hypothyroidism 09/03/2019   Vitamin D deficiency 03/02/2018   Fatigue 03/02/2018   Pupil asymmetry 08/04/2016   Hemorrhoids 12/28/2014   Steroid-induced diabetes mellitus (HCC) 08/28/2014   Tremor 06/30/2013   Sebaceous cyst 02/15/2013   OTHER&UNSPECIFIED DISEASES THE ORAL SOFT TISSUES 07/25/2008   DERMATOFIBROMA, ARM 11/11/2007   NEOPLASM, SKIN, UNCERTAIN BEHAVIOR 02/11/2007   Hyperlipidemia 02/10/2007   Anxiety disorder 02/10/2007   ALLERGIC RHINITIS 02/10/2007   GERD 02/10/2007   IBS 02/10/2007   Past Medical History:  Diagnosis Date   Allergy    allergic rhinitis   Anxiety  situational   COVID 05/2021   Difficult intubation    states 2012 ablation surgery, had diff intubation,glide scope used, not noted in EPIC anesthesia record   Elevated hemoglobin A1c 08/08/2015   Elevated liver function tests 08/08/2015   GERD (gastroesophageal reflux disease)    Hyperlipidemia    Low serum vitamin D 08/08/2015   Status post endometrial ablation    Tennis elbow    currently in PT-10/16   Past Surgical History:  Procedure Laterality Date   BIOPSY  02/09/2022   Procedure: BIOPSY;  Surgeon: Lemar Lofty., MD;  Location: [facility];  Service: Gastroenterology;;   CESAREAN SECTION     COLONOSCOPY WITH PROPOFOL N/A 02/09/2022   Procedure: COLONOSCOPY WITH PROPOFOL;  Surgeon: Lemar Lofty., MD;  Location: [facility];  Service:  Gastroenterology;  Laterality: N/A;   ENDOMETRIAL ABLATION     8/12   ESOPHAGOGASTRODUODENOSCOPY (EGD) WITH PROPOFOL N/A 02/09/2022   Procedure: ESOPHAGOGASTRODUODENOSCOPY (EGD) WITH PROPOFOL;  Surgeon: Meridee Score Netty Starring., MD;  Location: [facility];  Service: Gastroenterology;  Laterality: N/A;   EXTREMITY WIRE/PIN REMOVAL Left 07/16/2016   Procedure: PIN REMOVAL OF LEFT WRIST X 2;  Surgeon: Dominica Severin, MD;  Location: [facility];  Service: Orthopedics;  Laterality: Left;   FOOT SURGERY Left    LAPAROSCOPIC TUBAL LIGATION  06/09/2011   Procedure: LAPAROSCOPIC TUBAL LIGATION;  Surgeon: Melony Overly;  Location: [facility];  Service: Gynecology;  Laterality: N/A;   LIGAMENT REPAIR Left 05/08/2016   Procedure: LEFT WRIST SCAPHOLUNATE LIGAMENT RECONSTRUCTION WITH TENDON GRAFT PIN NEURECTOMY AND REPAIR;  Surgeon: Dominica Severin, MD;  Location:  SURGERY CENTER;  Service: Orthopedics;  Laterality: Left;   LIVER TRANSPLANT  09/2022   POLYPECTOMY  02/09/2022   Procedure: POLYPECTOMY;  Surgeon: Mansouraty, Netty Starring., MD;  Location: [facility];  Service: Gastroenterology;;   TONSILLECTOMY     as child   Social History   Tobacco Use   Smoking status: Former    Current packs/day: 0.00    Types: Cigarettes    Quit date: 07/25/1983    Years since quitting: 39.7   Smokeless tobacco: Never   Tobacco comments:    in high school  Vaping Use   Vaping status: Never Used  Substance Use Topics   Alcohol use: Not Currently    Alcohol/week: 14.0 standard drinks of alcohol    Types: 14 Glasses of wine per week    Comment: 6 shots daily 12 shots daily on weekends   Drug use: No   Family History  Problem Relation Age of Onset   Cancer Mother        [place] insitu of appendix   Heart disease Father        CAD   Diabetes Father        type II   Alzheimer's disease Father    Colon polyps Father    Diabetes Brother        type II   Diabetes Maternal Grandfather     Diabetes Paternal Grandmother    Diabetes Paternal Grandfather    Breast cancer Neg Hx    Colon cancer Neg Hx    Stomach cancer Neg Hx    Esophageal cancer Neg Hx    Pancreatic cancer Neg Hx    Allergies  Allergen Reactions   Lipitor [Atorvastatin] Other (See Comments)    myalgias   Current Outpatient Medications on File Prior to Visit  Medication Sig Dispense Refill   Calcium Citrate-Vitamin D 315-5  MG-MCG TABS Take by mouth.     famotidine (PEPCID) 40 MG tablet Take 1 tablet (40 mg total) by mouth at bedtime 30 tablet 11   insulin lispro (HUMALOG) 100 UNIT/ML injection Inject 1 Units into the skin daily as needed for high blood sugar. As directed     mycophenolate (CELLCEPT) 250 MG capsule Take 4 capsules (1,000 mg total) by mouth every 12 (twelve) hours. 240 capsule 11   omeprazole (PRILOSEC) 20 MG capsule Take 20 mg by mouth daily.     Polyvinyl Alcohol-Povidone (REFRESH OP) Place 1-2 drops into both eyes 4 (four) times daily as needed (dry eye).     predniSONE (DELTASONE) 5 MG tablet Take 4 tablets (20 mg total) by mouth daily. (Patient taking differently: Take 15 mg by mouth daily.) 120 tablet 11   Specialty Vitamins Products (MG PLUS PROTEIN) 133 MG TABS Take by mouth.     tacrolimus (PROGRAF) 1 MG capsule Take 2 capsules (2 mg total) by mouth every 12 (twelve) hours. 120 capsule 11   No current facility-administered medications on file prior to visit.    Review of Systems  Constitutional:  Positive for unexpected weight change. Negative for activity change, appetite change, fatigue and fever.       Has weight distribution change since starting prednisone Some side effects of anti rejection drugs   HENT:  Negative for congestion, ear pain, rhinorrhea, sinus pressure and sore throat.   Eyes:  Negative for pain, redness and visual disturbance.  Respiratory:  Negative for cough, shortness of breath and wheezing.   Cardiovascular:  Negative for chest pain and palpitations.   Gastrointestinal:  Negative for abdominal pain, blood in stool, constipation and diarrhea.       Lack of muscle tone in abdomen Feels weak  Endocrine: Negative for polydipsia and polyuria.  Genitourinary:  Negative for dysuria, frequency and urgency.  Musculoskeletal:  Negative for arthralgias, back pain and myalgias.  Skin:  Negative for pallor and rash.  Allergic/Immunologic: Negative for environmental allergies.  Neurological:  Positive for weakness. Negative for dizziness, syncope and headaches.  Hematological:  Negative for adenopathy. Does not bruise/bleed easily.  Psychiatric/Behavioral:  Negative for decreased concentration and dysphoric mood. The patient is not nervous/anxious.        Objective:   Physical Exam Constitutional:      General: She is not in acute distress.    Appearance: Normal appearance. She is well-developed. She is obese. She is not ill-appearing or diaphoretic.     Comments: Round face from steroid use   HENT:     Head: Normocephalic and atraumatic.  Eyes:     Conjunctiva/sclera: Conjunctivae normal.     Pupils: Pupils are equal, round, and reactive to light.  Neck:     Thyroid: No thyromegaly.     Vascular: No carotid bruit or JVD.  Cardiovascular:     Rate and Rhythm: Normal rate and regular rhythm.     Heart sounds: Normal heart sounds.     No gallop.  Pulmonary:     Effort: Pulmonary effort is normal. No respiratory distress.     Breath sounds: Normal breath sounds. No wheezing or rales.  Abdominal:     General: There is no distension or abdominal bruit.     Palpations: Abdomen is soft. There is no mass.     Tenderness: There is no abdominal tenderness. There is no guarding.     Hernia: No hernia is present.     Comments: Large horizontal  abd scar from transplant No palpable hernia noted Non tender    Musculoskeletal:     Cervical back: Normal range of motion and neck supple.     Right lower leg: No edema.     Left lower leg: No edema.      Comments: Hump on back noted from steroid use   Lymphadenopathy:     Cervical: No cervical adenopathy.  Skin:    General: Skin is warm and dry.     Coloration: Skin is not jaundiced or pale.     Findings: No bruising or rash.  Neurological:     Mental Status: She is alert.     Coordination: Coordination normal.     Deep Tendon Reflexes: Reflexes are normal and symmetric. Reflexes normal.     Comments: Lack of sensation around scar on abdomen  Psychiatric:        Mood and Affect: Mood normal.     Comments: In good spirits            Assessment & Plan:   Problem List Items Addressed This Visit       Endocrine   Steroid-induced diabetes mellitus (HCC) - Primary    Taking prednisone 15 mg now- recently dec from 20 and expects to be on it for about 3 more months with gradually dec dose Has weight gain/moon face / cushing's appearance  No symptoms from her hyperglycemia however and frustrated that diet and exercise do not seem to affect her glucose levels Lab Results  Component Value Date   HGBA1C 6.9 (A) 04/29/2023   Reviewed glucose and diet log today Proposed short term metformin-pt prefers not to  Will adjust her SSI (see AVS) by 1 unit - starting at glucose of 180 instead of 200  Will watch closely for hypoglycemia Discussed lower glycemic balanced diet  Discussed adding strength training as well  Plan follow up 3 months        Relevant Medications   insulin lispro (HUMALOG) 100 UNIT/ML injection     Other   On prednisone therapy    Gradually decreasing dose Causing hyperglycemia Also discussed concerns re : bone density   Dexa ordered Pt will call to schedule        Relevant Orders   DG Bone Density   H/O liver transplant (HCC)    Reviewed recent labs and notes from [facility] (yesterday) Overall doing well and stable   Gradually coming down on prednisone  Also continues mycophenolate and tacrolimus  Is compliant with meds and program No  alcohol Mood is positive       Estrogen deficiency    Dexa ordered  Also on prednisone       Relevant Orders   DG Bone Density   Abdominal weakness    Since her liver transplant  Interested in PT to safely start some exercise to build strength Reassuring exam today  Referral done       Relevant Orders   Ambulatory referral to Physical Therapy

## 2023-04-29 NOTE — Assessment & Plan Note (Signed)
Gradually decreasing dose Causing hyperglycemia Also discussed concerns re : bone density   Dexa ordered Pt will call to schedule

## 2023-05-01 ENCOUNTER — Other Ambulatory Visit (HOSPITAL_COMMUNITY): Payer: Self-pay

## 2023-05-03 ENCOUNTER — Other Ambulatory Visit: Payer: Self-pay

## 2023-05-03 ENCOUNTER — Other Ambulatory Visit (HOSPITAL_COMMUNITY): Payer: Self-pay

## 2023-05-05 DIAGNOSIS — T380X5A Adverse effect of glucocorticoids and synthetic analogues, initial encounter: Secondary | ICD-10-CM | POA: Diagnosis not present

## 2023-05-05 DIAGNOSIS — B259 Cytomegaloviral disease, unspecified: Secondary | ICD-10-CM | POA: Diagnosis not present

## 2023-05-05 DIAGNOSIS — D849 Immunodeficiency, unspecified: Secondary | ICD-10-CM | POA: Diagnosis not present

## 2023-05-05 DIAGNOSIS — F1010 Alcohol abuse, uncomplicated: Secondary | ICD-10-CM | POA: Diagnosis not present

## 2023-05-05 DIAGNOSIS — Z944 Liver transplant status: Secondary | ICD-10-CM | POA: Diagnosis not present

## 2023-05-05 DIAGNOSIS — R739 Hyperglycemia, unspecified: Secondary | ICD-10-CM | POA: Diagnosis not present

## 2023-05-05 DIAGNOSIS — F1011 Alcohol abuse, in remission: Secondary | ICD-10-CM | POA: Diagnosis not present

## 2023-05-06 ENCOUNTER — Other Ambulatory Visit (HOSPITAL_COMMUNITY): Payer: Self-pay

## 2023-05-07 ENCOUNTER — Other Ambulatory Visit (HOSPITAL_COMMUNITY): Payer: Self-pay

## 2023-05-07 ENCOUNTER — Other Ambulatory Visit: Payer: Self-pay

## 2023-05-12 DIAGNOSIS — Z944 Liver transplant status: Secondary | ICD-10-CM | POA: Diagnosis not present

## 2023-05-12 DIAGNOSIS — D849 Immunodeficiency, unspecified: Secondary | ICD-10-CM | POA: Diagnosis not present

## 2023-05-13 ENCOUNTER — Other Ambulatory Visit (HOSPITAL_COMMUNITY): Payer: Self-pay

## 2023-05-13 MED ORDER — PREDNISONE 5 MG PO TABS
10.0000 mg | ORAL_TABLET | Freq: Every day | ORAL | 11 refills | Status: DC
  Filled 2023-05-13: qty 60, 30d supply, fill #0

## 2023-05-18 ENCOUNTER — Other Ambulatory Visit (HOSPITAL_COMMUNITY): Payer: Self-pay

## 2023-05-19 ENCOUNTER — Other Ambulatory Visit (HOSPITAL_COMMUNITY): Payer: Self-pay

## 2023-05-19 DIAGNOSIS — D849 Immunodeficiency, unspecified: Secondary | ICD-10-CM | POA: Diagnosis not present

## 2023-05-19 DIAGNOSIS — Z944 Liver transplant status: Secondary | ICD-10-CM | POA: Diagnosis not present

## 2023-05-19 MED ORDER — PREDNISONE 5 MG PO TABS
7.5000 mg | ORAL_TABLET | Freq: Every day | ORAL | 11 refills | Status: DC
  Filled 2023-05-19 – 2023-06-01 (×2): qty 45, 30d supply, fill #0

## 2023-05-20 ENCOUNTER — Other Ambulatory Visit (HOSPITAL_COMMUNITY): Payer: Self-pay

## 2023-05-22 ENCOUNTER — Other Ambulatory Visit (HOSPITAL_COMMUNITY): Payer: Self-pay

## 2023-06-01 ENCOUNTER — Other Ambulatory Visit (HOSPITAL_COMMUNITY): Payer: Self-pay

## 2023-06-02 DIAGNOSIS — D849 Immunodeficiency, unspecified: Secondary | ICD-10-CM | POA: Diagnosis not present

## 2023-06-02 DIAGNOSIS — Z944 Liver transplant status: Secondary | ICD-10-CM | POA: Diagnosis not present

## 2023-06-02 DIAGNOSIS — B259 Cytomegaloviral disease, unspecified: Secondary | ICD-10-CM | POA: Diagnosis not present

## 2023-06-03 ENCOUNTER — Other Ambulatory Visit (HOSPITAL_COMMUNITY): Payer: Self-pay

## 2023-06-03 ENCOUNTER — Ambulatory Visit: Payer: 59 | Admitting: Family Medicine

## 2023-06-04 ENCOUNTER — Other Ambulatory Visit (HOSPITAL_COMMUNITY): Payer: Self-pay

## 2023-06-04 MED ORDER — PREDNISONE 5 MG PO TABS
5.0000 mg | ORAL_TABLET | Freq: Every day | ORAL | 11 refills | Status: DC
  Filled 2023-06-04: qty 30, 30d supply, fill #0

## 2023-06-07 ENCOUNTER — Other Ambulatory Visit (HOSPITAL_COMMUNITY): Payer: Self-pay

## 2023-06-15 DIAGNOSIS — B259 Cytomegaloviral disease, unspecified: Secondary | ICD-10-CM | POA: Diagnosis not present

## 2023-06-15 DIAGNOSIS — D849 Immunodeficiency, unspecified: Secondary | ICD-10-CM | POA: Diagnosis not present

## 2023-06-15 DIAGNOSIS — Z944 Liver transplant status: Secondary | ICD-10-CM | POA: Diagnosis not present

## 2023-06-17 ENCOUNTER — Other Ambulatory Visit (HOSPITAL_COMMUNITY): Payer: Self-pay

## 2023-06-17 DIAGNOSIS — Z944 Liver transplant status: Secondary | ICD-10-CM | POA: Diagnosis not present

## 2023-06-17 DIAGNOSIS — B259 Cytomegaloviral disease, unspecified: Secondary | ICD-10-CM | POA: Diagnosis not present

## 2023-06-17 DIAGNOSIS — D849 Immunodeficiency, unspecified: Secondary | ICD-10-CM | POA: Diagnosis not present

## 2023-06-17 MED ORDER — MYCOPHENOLATE MOFETIL 250 MG PO CAPS
500.0000 mg | ORAL_CAPSULE | Freq: Two times a day (BID) | ORAL | 11 refills | Status: DC
  Filled 2023-06-17: qty 120, 30d supply, fill #0

## 2023-06-18 ENCOUNTER — Other Ambulatory Visit (HOSPITAL_COMMUNITY): Payer: Self-pay

## 2023-06-18 DIAGNOSIS — B259 Cytomegaloviral disease, unspecified: Secondary | ICD-10-CM | POA: Diagnosis not present

## 2023-06-18 DIAGNOSIS — D849 Immunodeficiency, unspecified: Secondary | ICD-10-CM | POA: Diagnosis not present

## 2023-06-18 DIAGNOSIS — Z944 Liver transplant status: Secondary | ICD-10-CM | POA: Diagnosis not present

## 2023-06-18 DIAGNOSIS — R768 Other specified abnormal immunological findings in serum: Secondary | ICD-10-CM | POA: Diagnosis not present

## 2023-06-18 DIAGNOSIS — F1011 Alcohol abuse, in remission: Secondary | ICD-10-CM | POA: Diagnosis not present

## 2023-06-18 DIAGNOSIS — F1010 Alcohol abuse, uncomplicated: Secondary | ICD-10-CM | POA: Diagnosis not present

## 2023-06-21 DIAGNOSIS — D849 Immunodeficiency, unspecified: Secondary | ICD-10-CM | POA: Diagnosis not present

## 2023-06-21 DIAGNOSIS — B259 Cytomegaloviral disease, unspecified: Secondary | ICD-10-CM | POA: Diagnosis not present

## 2023-06-21 DIAGNOSIS — Z944 Liver transplant status: Secondary | ICD-10-CM | POA: Diagnosis not present

## 2023-06-22 DIAGNOSIS — D849 Immunodeficiency, unspecified: Secondary | ICD-10-CM | POA: Diagnosis not present

## 2023-06-22 DIAGNOSIS — B259 Cytomegaloviral disease, unspecified: Secondary | ICD-10-CM | POA: Diagnosis not present

## 2023-06-22 DIAGNOSIS — Z944 Liver transplant status: Secondary | ICD-10-CM | POA: Diagnosis not present

## 2023-06-28 DIAGNOSIS — R768 Other specified abnormal immunological findings in serum: Secondary | ICD-10-CM | POA: Diagnosis not present

## 2023-06-28 DIAGNOSIS — Z794 Long term (current) use of insulin: Secondary | ICD-10-CM | POA: Diagnosis not present

## 2023-06-28 DIAGNOSIS — R7989 Other specified abnormal findings of blood chemistry: Secondary | ICD-10-CM | POA: Diagnosis not present

## 2023-06-28 DIAGNOSIS — Z944 Liver transplant status: Secondary | ICD-10-CM | POA: Diagnosis not present

## 2023-06-28 DIAGNOSIS — Z79621 Long term (current) use of calcineurin inhibitor: Secondary | ICD-10-CM | POA: Diagnosis not present

## 2023-06-28 DIAGNOSIS — Z8616 Personal history of COVID-19: Secondary | ICD-10-CM | POA: Diagnosis not present

## 2023-06-28 DIAGNOSIS — T380X5A Adverse effect of glucocorticoids and synthetic analogues, initial encounter: Secondary | ICD-10-CM | POA: Diagnosis not present

## 2023-06-28 DIAGNOSIS — B259 Cytomegaloviral disease, unspecified: Secondary | ICD-10-CM | POA: Diagnosis not present

## 2023-06-28 DIAGNOSIS — D849 Immunodeficiency, unspecified: Secondary | ICD-10-CM | POA: Diagnosis not present

## 2023-06-28 DIAGNOSIS — E1165 Type 2 diabetes mellitus with hyperglycemia: Secondary | ICD-10-CM | POA: Diagnosis not present

## 2023-06-28 DIAGNOSIS — Z79899 Other long term (current) drug therapy: Secondary | ICD-10-CM | POA: Diagnosis not present

## 2023-06-28 DIAGNOSIS — T8641 Liver transplant rejection: Secondary | ICD-10-CM | POA: Diagnosis not present

## 2023-06-28 DIAGNOSIS — R739 Hyperglycemia, unspecified: Secondary | ICD-10-CM | POA: Diagnosis not present

## 2023-06-28 DIAGNOSIS — Z7982 Long term (current) use of aspirin: Secondary | ICD-10-CM | POA: Diagnosis not present

## 2023-06-28 DIAGNOSIS — D84821 Immunodeficiency due to drugs: Secondary | ICD-10-CM | POA: Diagnosis not present

## 2023-06-29 ENCOUNTER — Other Ambulatory Visit: Payer: 59

## 2023-06-29 DIAGNOSIS — R7989 Other specified abnormal findings of blood chemistry: Secondary | ICD-10-CM | POA: Diagnosis not present

## 2023-06-29 DIAGNOSIS — R768 Other specified abnormal immunological findings in serum: Secondary | ICD-10-CM | POA: Diagnosis not present

## 2023-06-29 DIAGNOSIS — D849 Immunodeficiency, unspecified: Secondary | ICD-10-CM | POA: Diagnosis not present

## 2023-06-29 DIAGNOSIS — T8641 Liver transplant rejection: Secondary | ICD-10-CM | POA: Diagnosis not present

## 2023-06-29 DIAGNOSIS — Z944 Liver transplant status: Secondary | ICD-10-CM | POA: Diagnosis not present

## 2023-06-30 DIAGNOSIS — D849 Immunodeficiency, unspecified: Secondary | ICD-10-CM | POA: Diagnosis not present

## 2023-06-30 DIAGNOSIS — Z944 Liver transplant status: Secondary | ICD-10-CM | POA: Diagnosis not present

## 2023-06-30 DIAGNOSIS — T8641 Liver transplant rejection: Secondary | ICD-10-CM | POA: Diagnosis not present

## 2023-06-30 DIAGNOSIS — R768 Other specified abnormal immunological findings in serum: Secondary | ICD-10-CM | POA: Diagnosis not present

## 2023-06-30 DIAGNOSIS — R7989 Other specified abnormal findings of blood chemistry: Secondary | ICD-10-CM | POA: Diagnosis not present

## 2023-07-01 DIAGNOSIS — T380X5A Adverse effect of glucocorticoids and synthetic analogues, initial encounter: Secondary | ICD-10-CM | POA: Diagnosis not present

## 2023-07-01 DIAGNOSIS — D849 Immunodeficiency, unspecified: Secondary | ICD-10-CM | POA: Diagnosis not present

## 2023-07-01 DIAGNOSIS — R768 Other specified abnormal immunological findings in serum: Secondary | ICD-10-CM | POA: Diagnosis not present

## 2023-07-01 DIAGNOSIS — R7989 Other specified abnormal findings of blood chemistry: Secondary | ICD-10-CM | POA: Diagnosis not present

## 2023-07-01 DIAGNOSIS — R739 Hyperglycemia, unspecified: Secondary | ICD-10-CM | POA: Diagnosis not present

## 2023-07-01 DIAGNOSIS — Z944 Liver transplant status: Secondary | ICD-10-CM | POA: Diagnosis not present

## 2023-07-01 DIAGNOSIS — T8641 Liver transplant rejection: Secondary | ICD-10-CM | POA: Diagnosis not present

## 2023-07-02 DIAGNOSIS — Z944 Liver transplant status: Secondary | ICD-10-CM | POA: Diagnosis not present

## 2023-07-02 DIAGNOSIS — R7989 Other specified abnormal findings of blood chemistry: Secondary | ICD-10-CM | POA: Diagnosis not present

## 2023-07-02 DIAGNOSIS — T8641 Liver transplant rejection: Secondary | ICD-10-CM | POA: Diagnosis not present

## 2023-07-02 DIAGNOSIS — R768 Other specified abnormal immunological findings in serum: Secondary | ICD-10-CM | POA: Diagnosis not present

## 2023-07-02 DIAGNOSIS — D849 Immunodeficiency, unspecified: Secondary | ICD-10-CM | POA: Diagnosis not present

## 2023-07-03 ENCOUNTER — Other Ambulatory Visit (HOSPITAL_COMMUNITY): Payer: Self-pay

## 2023-07-03 DIAGNOSIS — D849 Immunodeficiency, unspecified: Secondary | ICD-10-CM | POA: Diagnosis not present

## 2023-07-03 DIAGNOSIS — T8641 Liver transplant rejection: Secondary | ICD-10-CM | POA: Diagnosis not present

## 2023-07-03 DIAGNOSIS — R7989 Other specified abnormal findings of blood chemistry: Secondary | ICD-10-CM | POA: Diagnosis not present

## 2023-07-03 DIAGNOSIS — Z944 Liver transplant status: Secondary | ICD-10-CM | POA: Diagnosis not present

## 2023-07-03 DIAGNOSIS — R768 Other specified abnormal immunological findings in serum: Secondary | ICD-10-CM | POA: Diagnosis not present

## 2023-07-03 DIAGNOSIS — B259 Cytomegaloviral disease, unspecified: Secondary | ICD-10-CM | POA: Diagnosis not present

## 2023-07-03 MED ORDER — MELATONIN 3 MG PO TABS
6.0000 mg | ORAL_TABLET | Freq: Every evening | ORAL | 0 refills | Status: DC | PRN
  Filled 2023-07-03: qty 60, 30d supply, fill #0

## 2023-07-03 MED ORDER — MYCOPHENOLATE MOFETIL 250 MG PO CAPS
1000.0000 mg | ORAL_CAPSULE | Freq: Two times a day (BID) | ORAL | 11 refills | Status: DC
  Filled 2023-07-03: qty 240, 30d supply, fill #0

## 2023-07-03 MED ORDER — PREDNISONE 5 MG PO TABS
ORAL_TABLET | ORAL | 0 refills | Status: DC
  Filled 2023-07-03: qty 291, 144d supply, fill #0

## 2023-07-04 DIAGNOSIS — D849 Immunodeficiency, unspecified: Secondary | ICD-10-CM | POA: Diagnosis not present

## 2023-07-04 DIAGNOSIS — B259 Cytomegaloviral disease, unspecified: Secondary | ICD-10-CM | POA: Diagnosis not present

## 2023-07-04 DIAGNOSIS — Z944 Liver transplant status: Secondary | ICD-10-CM | POA: Diagnosis not present

## 2023-07-05 ENCOUNTER — Other Ambulatory Visit (HOSPITAL_COMMUNITY): Payer: Self-pay

## 2023-07-05 DIAGNOSIS — Z7982 Long term (current) use of aspirin: Secondary | ICD-10-CM | POA: Diagnosis not present

## 2023-07-05 DIAGNOSIS — K838 Other specified diseases of biliary tract: Secondary | ICD-10-CM | POA: Diagnosis not present

## 2023-07-05 DIAGNOSIS — K7581 Nonalcoholic steatohepatitis (NASH): Secondary | ICD-10-CM | POA: Diagnosis not present

## 2023-07-05 DIAGNOSIS — D849 Immunodeficiency, unspecified: Secondary | ICD-10-CM | POA: Diagnosis not present

## 2023-07-05 DIAGNOSIS — K7290 Hepatic failure, unspecified without coma: Secondary | ICD-10-CM | POA: Diagnosis not present

## 2023-07-05 DIAGNOSIS — Z79621 Long term (current) use of calcineurin inhibitor: Secondary | ICD-10-CM | POA: Diagnosis not present

## 2023-07-05 DIAGNOSIS — R739 Hyperglycemia, unspecified: Secondary | ICD-10-CM | POA: Diagnosis not present

## 2023-07-05 DIAGNOSIS — T8641 Liver transplant rejection: Secondary | ICD-10-CM | POA: Diagnosis not present

## 2023-07-05 DIAGNOSIS — F419 Anxiety disorder, unspecified: Secondary | ICD-10-CM | POA: Diagnosis not present

## 2023-07-05 DIAGNOSIS — R21 Rash and other nonspecific skin eruption: Secondary | ICD-10-CM | POA: Diagnosis not present

## 2023-07-05 DIAGNOSIS — L508 Other urticaria: Secondary | ICD-10-CM | POA: Diagnosis not present

## 2023-07-05 DIAGNOSIS — Z5181 Encounter for therapeutic drug level monitoring: Secondary | ICD-10-CM | POA: Diagnosis not present

## 2023-07-05 DIAGNOSIS — Z7952 Long term (current) use of systemic steroids: Secondary | ICD-10-CM | POA: Diagnosis not present

## 2023-07-05 DIAGNOSIS — T8649 Other complications of liver transplant: Secondary | ICD-10-CM | POA: Diagnosis not present

## 2023-07-05 DIAGNOSIS — Z4682 Encounter for fitting and adjustment of non-vascular catheter: Secondary | ICD-10-CM | POA: Diagnosis not present

## 2023-07-05 DIAGNOSIS — Z8616 Personal history of COVID-19: Secondary | ICD-10-CM | POA: Diagnosis not present

## 2023-07-05 DIAGNOSIS — Z794 Long term (current) use of insulin: Secondary | ICD-10-CM | POA: Diagnosis not present

## 2023-07-05 DIAGNOSIS — K7400 Hepatic fibrosis, unspecified: Secondary | ICD-10-CM | POA: Diagnosis not present

## 2023-07-05 DIAGNOSIS — R7989 Other specified abnormal findings of blood chemistry: Secondary | ICD-10-CM | POA: Diagnosis not present

## 2023-07-05 DIAGNOSIS — Z792 Long term (current) use of antibiotics: Secondary | ICD-10-CM | POA: Diagnosis not present

## 2023-07-05 DIAGNOSIS — L309 Dermatitis, unspecified: Secondary | ICD-10-CM | POA: Diagnosis not present

## 2023-07-05 DIAGNOSIS — E099 Drug or chemical induced diabetes mellitus without complications: Secondary | ICD-10-CM | POA: Diagnosis not present

## 2023-07-05 DIAGNOSIS — T380X5D Adverse effect of glucocorticoids and synthetic analogues, subsequent encounter: Secondary | ICD-10-CM | POA: Diagnosis not present

## 2023-07-05 DIAGNOSIS — K739 Chronic hepatitis, unspecified: Secondary | ICD-10-CM | POA: Diagnosis not present

## 2023-07-05 DIAGNOSIS — T380X5A Adverse effect of glucocorticoids and synthetic analogues, initial encounter: Secondary | ICD-10-CM | POA: Diagnosis not present

## 2023-07-05 DIAGNOSIS — F1011 Alcohol abuse, in remission: Secondary | ICD-10-CM | POA: Diagnosis not present

## 2023-07-05 DIAGNOSIS — T8640 Unspecified complication of liver transplant: Secondary | ICD-10-CM | POA: Diagnosis not present

## 2023-07-05 DIAGNOSIS — B259 Cytomegaloviral disease, unspecified: Secondary | ICD-10-CM | POA: Diagnosis not present

## 2023-07-05 DIAGNOSIS — D84821 Immunodeficiency due to drugs: Secondary | ICD-10-CM | POA: Diagnosis not present

## 2023-07-05 DIAGNOSIS — R7401 Elevation of levels of liver transaminase levels: Secondary | ICD-10-CM | POA: Diagnosis not present

## 2023-07-05 DIAGNOSIS — Z944 Liver transplant status: Secondary | ICD-10-CM | POA: Diagnosis not present

## 2023-07-08 ENCOUNTER — Inpatient Hospital Stay: Payer: 59 | Admitting: Family Medicine

## 2023-07-09 DIAGNOSIS — R21 Rash and other nonspecific skin eruption: Secondary | ICD-10-CM | POA: Diagnosis not present

## 2023-07-13 ENCOUNTER — Ambulatory Visit: Payer: 59

## 2023-07-14 ENCOUNTER — Other Ambulatory Visit (HOSPITAL_COMMUNITY): Payer: Self-pay

## 2023-07-14 NOTE — Progress Notes (Signed)
52 y.o. [id number] Married Caucasian female here for annual exam.    No vaginal bleeding.  No menopausal symptoms.  Had an endometrial ablation.   Had a liver transplant at [facility].  Doing tx for CMV. Has had rejection symptoms.   She has been told she has a robust immune system, which was treated to eliminate this response.   Just out of the hospital after a 3 week stay.   Has been dealing with anemia, low platelets, and shortness of breath.  Her care team is aware.  Has had recurrent Covid infections.   Advised to do a Dexa scan due to prednisone treatment.  She will do through her PCP.   PCP:   Dr. Milinda Antis  No LMP recorded. Patient has had an ablation.           Sexually active: Yes.    The current method of family planning is ablation.    Exercising: No.   Smoker:  former  Health Maintenance: Pap:  07/16/21 neg: HR HPV neg, 03/21/18 neg: HR HPV neg History of abnormal Pap:  yes MMG: 09/28/2022 - BI-RADS1 - [facility], 09/15/21 Breast Density cat C, BI-RADS CAT 3 probably benign Colonoscopy:  02/09/22 BMD:   she will do here in [place].  TDaP:  2006.   Gardasil:   no HIV: 09/21/22 NR Hep C: 08/19/22 NR Screening Labs:  PCP   reports that she quit smoking about 40 years ago. Her smoking use included cigarettes. She has never used smokeless tobacco. She reports that she does not currently use alcohol after a past usage of about 14.0 standard drinks of alcohol per week. She reports that she does not use drugs.  Past Medical History:  Diagnosis Date   Allergy    allergic rhinitis   Anxiety    situational   COVID 05/2021   Difficult intubation    states 2012 ablation surgery, had diff intubation,glide scope used, not noted in EPIC anesthesia record   Elevated hemoglobin A1c 08/08/2015   Elevated liver function tests 08/08/2015   GERD (gastroesophageal reflux disease)    Hyperlipidemia    Low serum vitamin D 08/08/2015   Status post endometrial ablation    Tennis elbow    currently  in PT-10/16    Past Surgical History:  Procedure Laterality Date   BIOPSY  02/09/2022   Procedure: BIOPSY;  Surgeon: Lemar Lofty., MD;  Location: [facility];  Service: Gastroenterology;;   CESAREAN SECTION     COLONOSCOPY WITH PROPOFOL N/A 02/09/2022   Procedure: COLONOSCOPY WITH PROPOFOL;  Surgeon: Lemar Lofty., MD;  Location: [facility];  Service: Gastroenterology;  Laterality: N/A;   ENDOMETRIAL ABLATION     8/12   ESOPHAGOGASTRODUODENOSCOPY (EGD) WITH PROPOFOL N/A 02/09/2022   Procedure: ESOPHAGOGASTRODUODENOSCOPY (EGD) WITH PROPOFOL;  Surgeon: Meridee Score Netty Starring., MD;  Location: [facility];  Service: Gastroenterology;  Laterality: N/A;   EXTREMITY WIRE/PIN REMOVAL Left 07/16/2016   Procedure: PIN REMOVAL OF LEFT WRIST X 2;  Surgeon: Dominica Severin, MD;  Location: [facility];  Service: Orthopedics;  Laterality: Left;   FOOT SURGERY Left    LAPAROSCOPIC TUBAL LIGATION  06/09/2011   Procedure: LAPAROSCOPIC TUBAL LIGATION;  Surgeon: Melony Overly;  Location: [facility];  Service: Gynecology;  Laterality: N/A;   LIGAMENT REPAIR Left 05/08/2016   Procedure: LEFT WRIST SCAPHOLUNATE LIGAMENT RECONSTRUCTION WITH TENDON GRAFT PIN NEURECTOMY AND REPAIR;  Surgeon: Dominica Severin, MD;  Location: [facility];  Service: Orthopedics;  Laterality: Left;  LIVER TRANSPLANT  09/2022   POLYPECTOMY  02/09/2022   Procedure: POLYPECTOMY;  Surgeon: Mansouraty, Netty Starring., MD;  Location: [facility];  Service: Gastroenterology;;   TONSILLECTOMY     as child    Current Outpatient Medications  Medication Sig Dispense Refill   aspirin 81 MG chewable tablet Chew by mouth daily.     Calcium Citrate-Vitamin D 315-5 MG-MCG TABS Take by mouth.     clotrimazole (MYCELEX) 10 MG troche Take 1 tablet (10 mg total) by mouth 2 (two) times daily Dissolve tablet (troche) slowly and completely in mouth after breakfast and dinner. 60 Troche 2   fexofenadine  (ALLEGRA) 180 MG tablet Take 180 mg by mouth daily.     hydrocortisone 2.5 % cream Apply topically 2 (two) times daily as needed (itching) 30 g 0   insulin glargine-yfgn (SEMGLEE) 100 UNIT/ML injection Inject into the skin.     insulin lispro (HUMALOG) 100 UNIT/ML injection Inject 1 Units into the skin daily as needed for high blood sugar. As directed     insulin lispro (HUMALOG) 100 UNIT/ML KwikPen Max daily dose 110 units. 15 mL 2   mycophenolate (CELLCEPT) 250 MG capsule Take 6 capsules (1,500 mg total) by mouth every 12 (twelve) hours. 240 capsule 11   omeprazole (PRILOSEC) 20 MG capsule Take 20 mg by mouth daily.     Polyvinyl Alcohol-Povidone (REFRESH OP) Place 1-2 drops into both eyes 4 (four) times daily as needed (dry eye).     predniSONE (DELTASONE) 20 MG tablet Take 3 tablets (60 mg total) by mouth daily. 30 tablet 11   Specialty Vitamins Products (MG PLUS PROTEIN) 133 MG TABS Take by mouth.     Specialty Vitamins Products (MG PLUS PROTEIN) 133 MG TABS Take 3 tablets (399 mg total) by mouth 3 (three) times daily with meals Please space out 2 hours from immunosuppression meds (prograf) 540 tablet 0   sulfamethoxazole-trimethoprim (BACTRIM DS) 800-160 MG tablet Take 1 tablet (160 mg of trimethoprim total) by mouth every Monday, Wednesday, and Friday for 30 days 12 tablet 2   tacrolimus (PROGRAF) 1 MG capsule Take 4 capsules (4 mg total) by mouth every 12 (twelve) hours. 240 capsule 11   TECHLITE PEN NEEDLES 31G X 5 MM MISC      traZODone (DESYREL) 100 MG tablet Take 1-1.5 tablets (100-150 mg total) by mouth at bedtime as needed for sleep 30 tablet 1   clotrimazole (LOTRIMIN) 1 % cream Apply topically 2 (two) times daily Apply to groin, mix with hydrocortisone cream 1:1 (Patient not taking: Reported on 07/26/2023) 85 g 0   famotidine (PEPCID) 40 MG tablet Take 1 tablet (40 mg total) by mouth at bedtime (Patient not taking: Reported on 07/26/2023) 30 tablet 11   hydrOXYzine (VISTARIL) 25 MG  capsule Take 1 capsule (25 mg total) by mouth every 6 (six) hours as needed for Itching or Anxiety for up to 10 days (Patient not taking: Reported on 07/26/2023) 30 capsule 0   loratadine (CLARITIN) 10 MG tablet Take 1 tablet (10 mg total) by mouth every 12 (twelve) hours. (Patient not taking: Reported on 07/26/2023) 60 tablet 0   LORazepam (ATIVAN) 1 MG tablet Take 1 tablet (1 mg total) by mouth every 8 (eight) hours as needed for up to 10 days (Patient not taking: Reported on 07/26/2023) 10 tablet 0   melatonin 3 MG TABS tablet Take 2 tablets (6 mg total) by mouth at bedtime as needed. (Patient not taking: Reported on 07/26/2023) 60 tablet  0   triamcinolone cream (KENALOG) 0.1 % Apply topically 2 (two) times daily (Patient not taking: Reported on 07/26/2023) 30 g 0   No current facility-administered medications for this visit.    Family History  Problem Relation Age of Onset   Cancer Mother        [place] insitu of appendix   Heart disease Father        CAD   Diabetes Father        type II   Alzheimer's disease Father    Colon polyps Father    Diabetes Brother        type II   Diabetes Maternal Grandfather    Diabetes Paternal Grandmother    Diabetes Paternal Grandfather    Breast cancer Neg Hx    Colon cancer Neg Hx    Stomach cancer Neg Hx    Esophageal cancer Neg Hx    Pancreatic cancer Neg Hx     Review of Systems  All other systems reviewed and are negative.   Exam:   BP 118/84 (BP Location: Right Arm, Patient Position: Sitting, Cuff Size: Normal)   Pulse (!) 116   Ht 5' 2.5" (1.588 m)   Wt 188 lb (85.3 kg)   SpO2 99%   BMI 33.84 kg/m     General appearance: alert, cooperative and appears stated age Head: normocephalic, without obvious abnormality, atraumatic Neck: no adenopathy, supple, symmetrical, trachea midline and thyroid normal to inspection and palpation Lungs: clear to auscultation bilaterally Breasts: left - normal appearance, no masses or tenderness, No  nipple retraction or dimpling, No nipple discharge or bleeding, No axillary adenopathy Right - normal appearance, 3 - 4 mm firm periareolar mass at 7:00 (old change), no tenderness, No nipple retraction or dimpling, No nipple discharge or bleeding, No axillary adenopathy Heart: regular rate and rhythm Abdomen: upper abdominal curvilinear horizontal incision, soft, non-tender; no masses, no organomegaly Extremities: extremities normal, atraumatic, no cyanosis or edema Skin: skin color, texture, turgor normal. No rashes or lesions.  Bruising noted.  Lymph nodes: cervical, supraclavicular, and axillary nodes normal. Neurologic: grossly normal  Pelvic: External genitalia:  no lesions              No abnormal inguinal nodes palpated.              Urethra:  normal appearing urethra with no masses, tenderness or lesions              Bartholins and Skenes: normal                 Vagina: normal appearing vagina with normal color and discharge, no lesions              Cervix: no lesions              Pap taken: yes Bimanual Exam:  Uterus:  normal size, contour, position, consistency, mobility, non-tender              Adnexa: no mass, fullness, tenderness              Rectal exam: yes.  Confirms.              Anus:  normal sphincter tone, no lesions  Chaperone was present for exam:  Warren Lacy, CMA  Assessment:   Well woman visit with gynecologic exam. Status post BTL and endometrial ablation.  Remote history of abnormal pap. Uncertain history of LEEP procedure.  Cervical cancer screening.  History of prior bilateral  nipple discharge. Normal prolactin and TSH.  Hx elevated prolactin many years ago.  Status post liver transplant.  Right breast perioareolar mass. Prior dx imaging benign.   Plan: Mammogram screening discussed. Self breast awareness reviewed. Pap and HR HPV collected.  Guidelines for Calcium, Vitamin D, regular exercise program including cardiovascular and weight bearing  exercise. FU yearly and prn.  Follow up annually and prn.

## 2023-07-19 ENCOUNTER — Ambulatory Visit: Payer: 59

## 2023-07-19 ENCOUNTER — Other Ambulatory Visit (HOSPITAL_COMMUNITY): Payer: Self-pay

## 2023-07-20 IMAGING — US US BREAST*R* LIMITED INC AXILLA
1 series · 3 of 3 positions shown · non-contrast
Comparison: Previous exam(s).

CLINICAL DATA: Short interval follow-up for probably benign RIGHT
breast mass. Patient describes no interval change in palpable lump
in the periareolar RIGHT breast.

EXAM:
ULTRASOUND OF THE RIGHT BREAST

[Series 1: us breast*right* limited inc axilla · 0.05mm/px · 3 of 3 slices shown]
[im 1/3]
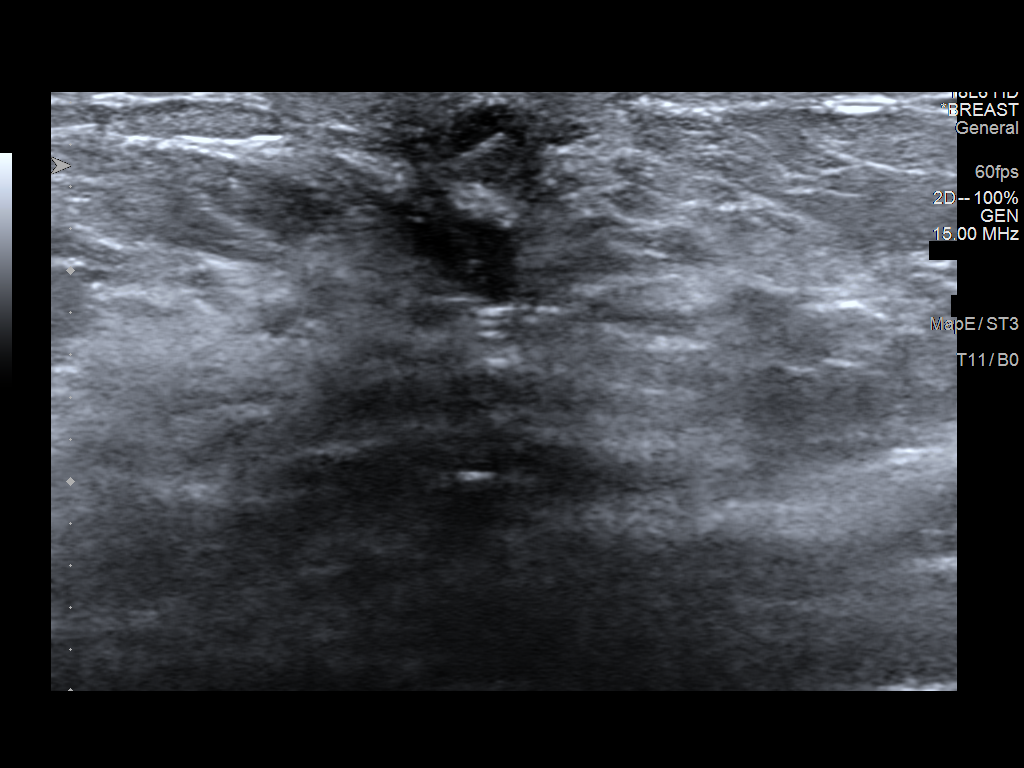
[im 2/3]
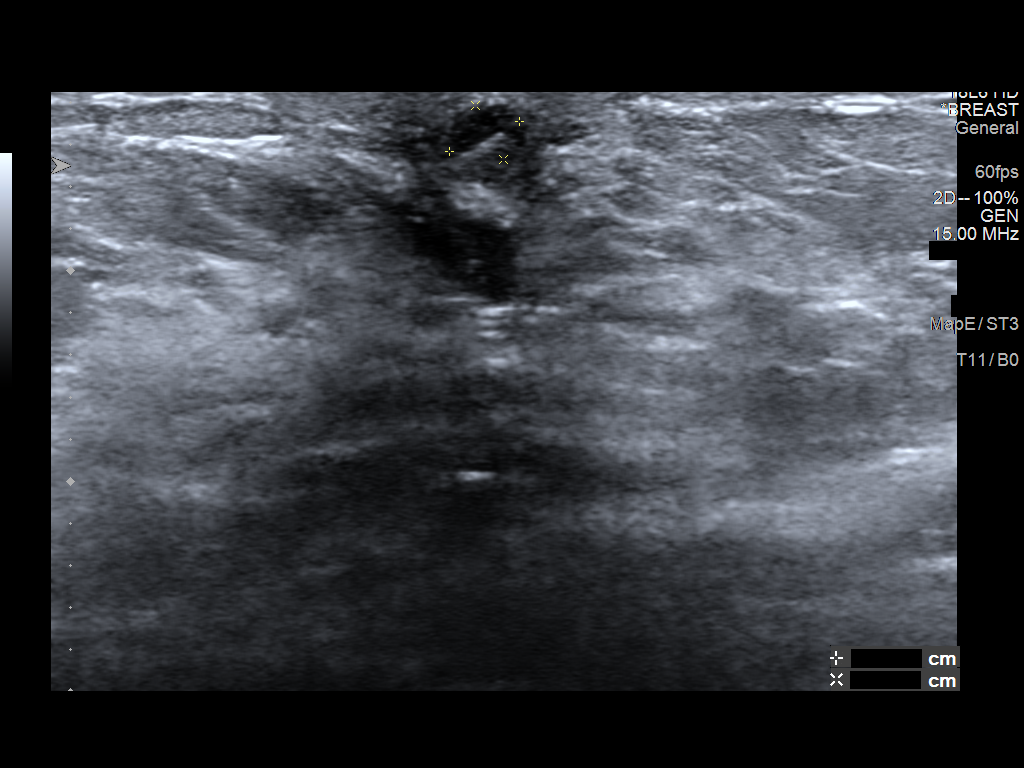
[im 3/3]
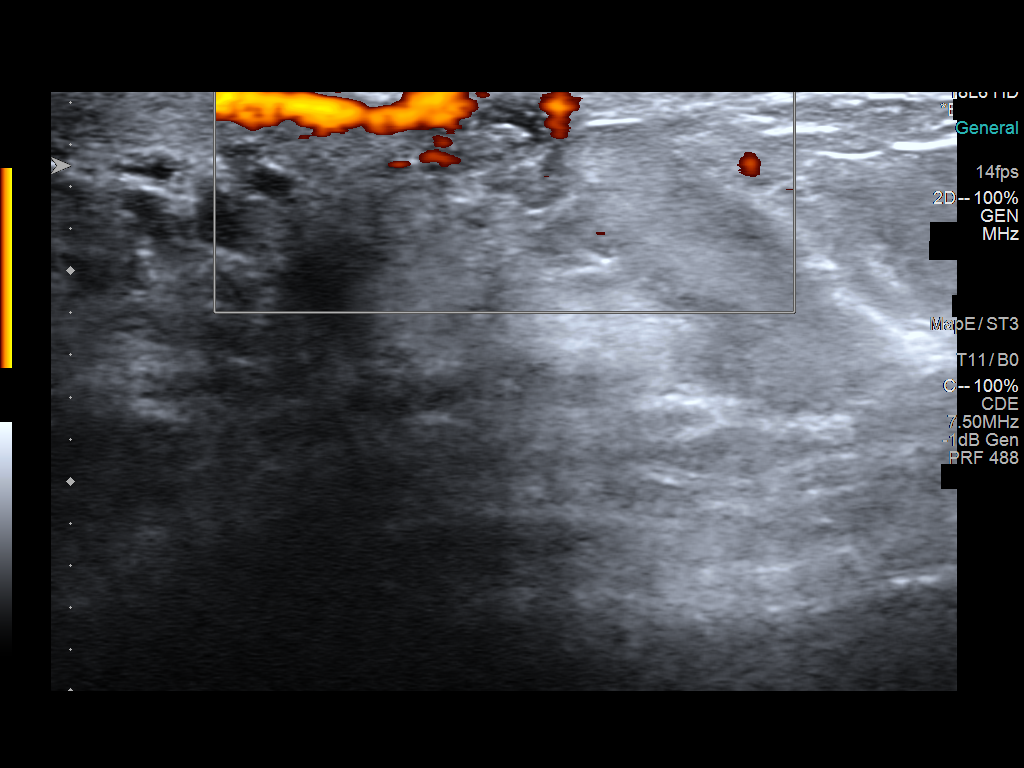

[3 of 3 positions shown; findings below may reference images not displayed]

FINDINGS: On physical exam, I palpate a discrete superficial BB-sized mass in
the areolar margin, 8 o'clock location.

Targeted ultrasound is performed, showing a circumscribed
intradermal mass corresponding to palpable mass in the 8 o'clock
location 1 centimeter from the nipple, measuring 0.3 x 0.4 x
centimeters. There is no internal blood flow by Doppler evaluation.
Mass appears similar to prior.
IMPRESSION: Stable appearance of probably benign lesion, likely a prominent
[REDACTED] gland. There is no sonographic evidence for malignancy.

RECOMMENDATION:
Recommend bilateral diagnostic mammogram with RIGHT breast
ultrasound in September 2021.

I have discussed the findings and recommendations with the patient.
If applicable, a reminder letter will be sent to the patient
regarding the next appointment.

BI-RADS CATEGORY  3: Probably benign.

## 2023-07-22 ENCOUNTER — Other Ambulatory Visit (HOSPITAL_COMMUNITY): Payer: Self-pay

## 2023-07-22 MED ORDER — LORATADINE 10 MG PO TABS
10.0000 mg | ORAL_TABLET | Freq: Two times a day (BID) | ORAL | 0 refills | Status: DC
  Filled 2023-07-22: qty 60, 30d supply, fill #0

## 2023-07-22 MED ORDER — TRAZODONE HCL 100 MG PO TABS
100.0000 mg | ORAL_TABLET | Freq: Every evening | ORAL | 1 refills | Status: DC | PRN
  Filled 2023-07-22: qty 30, 20d supply, fill #0
  Filled 2023-08-27: qty 30, 20d supply, fill #1

## 2023-07-22 MED ORDER — HYDROCORTISONE 2.5 % EX CREA
TOPICAL_CREAM | CUTANEOUS | 0 refills | Status: DC
  Filled 2023-07-22: qty 30, 10d supply, fill #0

## 2023-07-22 MED ORDER — PREDNISONE 20 MG PO TABS
60.0000 mg | ORAL_TABLET | Freq: Every day | ORAL | 11 refills | Status: DC
  Filled 2023-07-22: qty 30, 10d supply, fill #0
  Filled 2023-08-02 (×2): qty 30, 10d supply, fill #1

## 2023-07-22 MED ORDER — CLOTRIMAZOLE 1 % EX CREA: TOPICAL_CREAM | CUTANEOUS | 0 refills | Status: DC

## 2023-07-22 MED ORDER — MYCOPHENOLATE MOFETIL 250 MG PO CAPS
1500.0000 mg | ORAL_CAPSULE | Freq: Two times a day (BID) | ORAL | 11 refills | Status: DC
  Filled 2023-07-22: qty 240, 20d supply, fill #0
  Filled 2023-08-23: qty 240, 20d supply, fill #1
  Filled 2023-09-08: qty 240, 20d supply, fill #2
  Filled 2023-09-29: qty 240, 20d supply, fill #3
  Filled 2023-10-19: qty 240, 20d supply, fill #4
  Filled 2023-11-10: qty 240, 20d supply, fill #5
  Filled 2023-11-30: qty 240, 20d supply, fill #6

## 2023-07-22 MED ORDER — TACROLIMUS 1 MG PO CAPS
5.0000 mg | ORAL_CAPSULE | Freq: Two times a day (BID) | ORAL | 11 refills | Status: DC
  Filled 2023-07-22: qty 300, 30d supply, fill #0

## 2023-07-22 MED ORDER — TRIAMCINOLONE ACETONIDE 0.1 % EX CREA
TOPICAL_CREAM | Freq: Two times a day (BID) | CUTANEOUS | 0 refills | Status: DC
  Filled 2023-07-22: qty 30, 10d supply, fill #0

## 2023-07-22 MED ORDER — INSULIN GLARGINE-YFGN 100 UNIT/ML ~~LOC~~ SOLN
15.0000 [IU] | Freq: Every evening | SUBCUTANEOUS | 0 refills | Status: DC
  Filled 2023-07-22: qty 10, 28d supply, fill #0

## 2023-07-22 MED ORDER — MG PLUS PROTEIN 133 MG PO TABS
ORAL_TABLET | ORAL | 0 refills | Status: DC
  Filled 2023-07-22: qty 540, 60d supply, fill #0

## 2023-07-22 MED ORDER — INSULIN LISPRO (1 UNIT DIAL) 100 UNIT/ML (KWIKPEN)
PEN_INJECTOR | SUBCUTANEOUS | 2 refills | Status: DC
Start: 2023-07-22 — End: 2023-09-22
  Filled 2023-07-22: qty 15, 14d supply, fill #0
  Filled 2023-08-02 (×3): qty 15, 14d supply, fill #1
  Filled 2023-08-02: qty 15, 13d supply, fill #1
  Filled 2023-08-03: qty 15, 14d supply, fill #1
  Filled 2023-08-27: qty 15, 14d supply, fill #2

## 2023-07-22 MED ORDER — LORAZEPAM 1 MG PO TABS
1.0000 mg | ORAL_TABLET | Freq: Three times a day (TID) | ORAL | 0 refills | Status: DC | PRN
  Filled 2023-07-22: qty 10, 4d supply, fill #0

## 2023-07-22 MED ORDER — HYDROXYZINE PAMOATE 25 MG PO CAPS
ORAL_CAPSULE | ORAL | 0 refills | Status: DC
  Filled 2023-07-22: qty 30, 10d supply, fill #0

## 2023-07-22 MED ORDER — SULFAMETHOXAZOLE-TRIMETHOPRIM 800-160 MG PO TABS
ORAL_TABLET | ORAL | 2 refills | Status: DC
  Filled 2023-07-22: qty 12, 28d supply, fill #0
  Filled 2023-08-27: qty 12, 28d supply, fill #1
  Filled 2023-09-22: qty 12, 28d supply, fill #2

## 2023-07-23 ENCOUNTER — Other Ambulatory Visit (HOSPITAL_COMMUNITY): Payer: Self-pay

## 2023-07-23 DIAGNOSIS — B259 Cytomegaloviral disease, unspecified: Secondary | ICD-10-CM | POA: Diagnosis not present

## 2023-07-23 DIAGNOSIS — D849 Immunodeficiency, unspecified: Secondary | ICD-10-CM | POA: Diagnosis not present

## 2023-07-23 DIAGNOSIS — Z944 Liver transplant status: Secondary | ICD-10-CM | POA: Diagnosis not present

## 2023-07-23 MED ORDER — CLOTRIMAZOLE 10 MG MT TROC
OROMUCOSAL | 2 refills | Status: DC
  Filled 2023-07-23: qty 60, 30d supply, fill #0
  Filled 2023-08-27: qty 60, 30d supply, fill #1
  Filled 2023-09-22: qty 60, 30d supply, fill #2

## 2023-07-24 DIAGNOSIS — B259 Cytomegaloviral disease, unspecified: Secondary | ICD-10-CM | POA: Diagnosis not present

## 2023-07-24 DIAGNOSIS — D849 Immunodeficiency, unspecified: Secondary | ICD-10-CM | POA: Diagnosis not present

## 2023-07-24 DIAGNOSIS — Z944 Liver transplant status: Secondary | ICD-10-CM | POA: Diagnosis not present

## 2023-07-26 ENCOUNTER — Other Ambulatory Visit (HOSPITAL_COMMUNITY): Payer: Self-pay

## 2023-07-26 ENCOUNTER — Other Ambulatory Visit: Payer: Self-pay

## 2023-07-26 ENCOUNTER — Ambulatory Visit: Payer: 59 | Attending: Family Medicine

## 2023-07-26 DIAGNOSIS — R293 Abnormal posture: Secondary | ICD-10-CM | POA: Insufficient documentation

## 2023-07-26 DIAGNOSIS — R279 Unspecified lack of coordination: Secondary | ICD-10-CM | POA: Diagnosis not present

## 2023-07-26 DIAGNOSIS — D849 Immunodeficiency, unspecified: Secondary | ICD-10-CM | POA: Diagnosis not present

## 2023-07-26 DIAGNOSIS — B259 Cytomegaloviral disease, unspecified: Secondary | ICD-10-CM | POA: Diagnosis not present

## 2023-07-26 DIAGNOSIS — M6281 Muscle weakness (generalized): Secondary | ICD-10-CM | POA: Insufficient documentation

## 2023-07-26 DIAGNOSIS — Z944 Liver transplant status: Secondary | ICD-10-CM | POA: Diagnosis not present

## 2023-07-26 MED ORDER — TACROLIMUS 1 MG PO CAPS
1.0000 mg | ORAL_CAPSULE | Freq: Two times a day (BID) | ORAL | 11 refills | Status: DC
  Filled 2023-07-26: qty 270, 135d supply, fill #0

## 2023-07-26 MED ORDER — TACROLIMUS 1 MG PO CAPS
4.0000 mg | ORAL_CAPSULE | Freq: Two times a day (BID) | ORAL | 11 refills | Status: DC
  Filled 2023-07-26: qty 240, 30d supply, fill #0

## 2023-07-26 NOTE — Therapy (Signed)
OUTPATIENT PHYSICAL THERAPY FEMALE PELVIC EVALUATION   Patient Name: Jamie Coleman MRN: [id number] DOB:1971-04-24, 52 y.o., female Today's Date: 07/26/2023  END OF SESSION:  PT End of Session - 07/26/23 1101     Visit Number 1    Date for PT Re-Evaluation 01/10/24    Authorization Type [employer] employee    PT Start Time 1100    PT Stop Time 1140    PT Time Calculation (min) 40 min    Activity Tolerance Patient tolerated treatment well    Behavior During Therapy [facility] for tasks assessed/performed             Past Medical History:  Diagnosis Date   Allergy    allergic rhinitis   Anxiety    situational   COVID 05/2021   Difficult intubation    states 2012 ablation surgery, had diff intubation,glide scope used, not noted in EPIC anesthesia record   Elevated hemoglobin A1c 08/08/2015   Elevated liver function tests 08/08/2015   GERD (gastroesophageal reflux disease)    Hyperlipidemia    Low serum vitamin D 08/08/2015   Status post endometrial ablation    Tennis elbow    currently in PT-10/16   Past Surgical History:  Procedure Laterality Date   BIOPSY  02/09/2022   Procedure: BIOPSY;  Surgeon: Lemar Lofty., MD;  Location: [facility];  Service: Gastroenterology;;   CESAREAN SECTION     COLONOSCOPY WITH PROPOFOL N/A 02/09/2022   Procedure: COLONOSCOPY WITH PROPOFOL;  Surgeon: Lemar Lofty., MD;  Location: [facility];  Service: Gastroenterology;  Laterality: N/A;   ENDOMETRIAL ABLATION     8/12   ESOPHAGOGASTRODUODENOSCOPY (EGD) WITH PROPOFOL N/A 02/09/2022   Procedure: ESOPHAGOGASTRODUODENOSCOPY (EGD) WITH PROPOFOL;  Surgeon: Meridee Score Netty Starring., MD;  Location: [facility];  Service: Gastroenterology;  Laterality: N/A;   EXTREMITY WIRE/PIN REMOVAL Left 07/16/2016   Procedure: PIN REMOVAL OF LEFT WRIST X 2;  Surgeon: Dominica Severin, MD;  Location: [facility];  Service: Orthopedics;  Laterality: Left;   FOOT SURGERY Left     LAPAROSCOPIC TUBAL LIGATION  06/09/2011   Procedure: LAPAROSCOPIC TUBAL LIGATION;  Surgeon: Melony Overly;  Location: [facility];  Service: Gynecology;  Laterality: N/A;   LIGAMENT REPAIR Left 05/08/2016   Procedure: LEFT WRIST SCAPHOLUNATE LIGAMENT RECONSTRUCTION WITH TENDON GRAFT PIN NEURECTOMY AND REPAIR;  Surgeon: Dominica Severin, MD;  Location: [facility];  Service: Orthopedics;  Laterality: Left;   LIVER TRANSPLANT  09/2022   POLYPECTOMY  02/09/2022   Procedure: POLYPECTOMY;  Surgeon: Mansouraty, Netty Starring., MD;  Location: [facility];  Service: Gastroenterology;;   TONSILLECTOMY     as child   Patient Active Problem List   Diagnosis Date Noted   On prednisone therapy 04/29/2023   Abdominal weakness 04/29/2023   Multiple joint pain 02/15/2023   Menopause 02/15/2023   H/O liver transplant (HCC) 02/15/2023   Estrogen deficiency 09/24/2022   Acute on chronic alcoholic liver disease (HCC) 09/22/2022   Decompensation of cirrhosis of liver (HCC) 09/22/2022   History of alcoholic hepatitis 09/22/2022   Cirrhosis, alcoholic (HCC) 09/02/2022   Macrocytosis without anemia 09/02/2022   Acute liver failure without hepatic coma 09/01/2022   Bronchitis due to COVID-19 virus 05/05/2022   Elevated liver transaminase level 09/03/2019   Subclinical hypothyroidism 09/03/2019   Vitamin D deficiency 03/02/2018   Fatigue 03/02/2018   Pupil asymmetry 08/04/2016   Hemorrhoids 12/28/2014   Steroid-induced diabetes mellitus (HCC) 08/28/2014   Tremor 06/30/2013   Sebaceous  cyst 02/15/2013   OTHER&UNSPECIFIED DISEASES THE ORAL SOFT TISSUES 07/25/2008   DERMATOFIBROMA, ARM 11/11/2007   NEOPLASM, SKIN, UNCERTAIN BEHAVIOR 02/11/2007   Hyperlipidemia 02/10/2007   Anxiety disorder 02/10/2007   Allergic rhinitis 02/10/2007   GERD 02/10/2007   IBS 02/10/2007    PCP: Judy Pimple, MD  REFERRING PROVIDER: Judy Pimple, MD  REFERRING DIAG: R19.8 (ICD-10-CM) - Abdominal  weakness  THERAPY DIAG:  Muscle weakness (generalized)  Abnormal posture  Unspecified lack of coordination  Rationale for Evaluation and Treatment: Rehabilitation  ONSET DATE: December 2023  SUBJECTIVE:                                                                                                                                                                                           SUBJECTIVE STATEMENT: Pt states that she has just been released from hospital last week for issues with her liver rejection. She states that since her liver transplant in December 2023 she feels like she has not been able to get core strength back. She was instructed not to do anything for 6 months. No restrictions currently.  Fluid intake: Yes: a lot of water; 2-3 liters of just water; liter of other fluids     PAIN:  Are you having pain? No   PRECAUTIONS: Other: Liver transplant December 2023, c-section  RED FLAGS: None   WEIGHT BEARING RESTRICTIONS: No  FALLS:  Has patient fallen in last 6 months? No  LIVING ENVIRONMENT: Lives with: lives with their family Lives in: House/apartment   OCCUPATION: nurse in GI  PLOF: Independent  PATIENT GOALS: to learn safe abdominal exercises   PERTINENT HISTORY:  Liver transplant, c-section   BOWEL MOVEMENT: Pain with bowel movement: No Type of bowel movement:Frequency 1-2x/day and Strain No Fully empty rectum: Yes: - Leakage: No Pads: No Fiber supplement: No  URINATION: Pain with urination: No Fully empty bladder: Yes: - Stream: Strong Urgency: No Frequency: every 3-4 hours  Leakage:  none Pads: No  INTERCOURSE: Pain with intercourse:  no pain   PREGNANCY: Vaginal deliveries no  Tearing No C-section deliveries 1 - twins - abdominal muscle tearing Currently pregnant No  PROLAPSE: None   OBJECTIVE:  Note: Objective measures were completed at Evaluation unless otherwise noted. 07/26/23: COGNITION: Overall cognitive  status: Within functional limits for tasks assessed     SENSATION: Light touch: Appears intact Proprioception: Appears intact   FUNCTIONAL TESTS:  Squat: Rt weight shift with Lt trunk rotation Single leg stance:  Rt: unstable, shaking, pelvic drop  Lt: unstable, shaking, pelvic drop Chin up test: significant difficulty, doming in lower abdominals ASLR: (-) bil, but  shaking evident in bil LE Sit-up test: unable 0/3 Diastasis recti abdominus: 2 finger width separation with distortion inferior to umbilicus  GAIT: Comments: decreased trunk rotation  POSTURE: rounded shoulders, forward head, decreased lumbar lordosis, increased thoracic kyphosis, posterior pelvic tilt, and -  LUMBARAROM/PROM:  A/PROM A/PROM  Eval % available  Flexion 100  Extension 75  Right lateral flexion 50  Left lateral flexion 75  Right rotation 75, pain under Rt ribs  Left rotation 100   (Blank rows = not tested)   PALPATION:   General  Significant restriction throughout upper abdominals  TODAY'S TREATMENT:                                                                                                                              DATE:  07/26/23  EVAL  Neuromuscular re-education: Transversus abdominus training with multimodal cues for improved motor control and breath coordination Bil UE ball press 10x Side lying UE ball press 5x bil Exercises: Lower trunk rotation 10x Open books 5x bil   PATIENT EDUCATION:  Education details: See above Person educated: Patient Education method: Programmer, multimedia, Demonstration, Tactile cues, Verbal cues, and Handouts Education comprehension: verbalized understanding  HOME EXERCISE PROGRAM: [id number]  ASSESSMENT:  CLINICAL IMPRESSION: Patient is a 52 y.o. female who was seen today for physical therapy evaluation and treatment for abdominal weakness since liver transplant in December 2023. Exam findings notable for decreased functional core strength with pelvic  drop in single leg stance and poor balance, abnormal squat form with weight shift, decreased lumbar A/ROM, abdominal scar tissue restriction, diastasis recti 2 finger widths with distortion, and 0/3 sit-up test. Signs and symptoms are most consistent with abdominal scar tissue restriction, core weakness, and poor proprioception of abdominals due to scar tissue/multiple surgeries. Initial treatment consisted of transversus abdominus contraction training, core exercise progressions, and abdominal mobility activities; she demonstrated good tolerance to all activities this session with no discomfort reported. She will continue to benefit from skilled PT intervention in order to improve core strength and begin/progress functional strengthening program.   OBJECTIVE IMPAIRMENTS: decreased activity tolerance, decreased coordination, decreased endurance, decreased mobility, decreased strength, increased fascial restrictions, increased muscle spasms, impaired tone, postural dysfunction, and pain.   ACTIVITY LIMITATIONS: lifting, bending, sitting, squatting, and bed mobility  PARTICIPATION LIMITATIONS: community activity and occupation  PERSONAL FACTORS: 1 comorbidity: medical history  are also affecting patient's functional outcome.   REHAB POTENTIAL: Good  CLINICAL DECISION MAKING: Stable/uncomplicated  EVALUATION COMPLEXITY: Low   GOALS: Goals reviewed with patient? Yes  SHORT TERM GOALS: Target date: 08/23/23  Pt will be independent with HEP.   Baseline: Goal status: INITIAL  2.  Pt will be able to perform active transversus abdominus contraction with appropriate pressure management and breathing.  Baseline:  Goal status: INITIAL  3.  Pt will be independent with self scar tissue mobilization.  Baseline:  Goal status: INITIAL    LONG TERM GOALS: Target date: 01/10/2024  Pt  will be independent with advanced HEP.   Baseline:  Goal status: INITIAL  2.  Pt will be able to sit up in bed  and perform bed mobility without abdominal distortion.  Baseline:  Goal status: INITIAL  3.  Pt will be able to score 2/3 on sit-up test without abdominal distortion. Baseline:  Goal status: INITIAL   PLAN:  PT FREQUENCY: 1-2x/week  PT DURATION: 8 weeks  PLANNED INTERVENTIONS: 97110-Therapeutic exercises, 97530- Therapeutic activity, 97112- Neuromuscular re-education, 97535- Self Care, 16109- Manual therapy, Patient/Family education, Dry Needling, Biofeedback, and 97146- PT Re-evaluation  PLAN FOR NEXT SESSION: Plan to progress core strengthening; mobility exercises; teach scar tissue mobilization.    Julio Alm, PT, DPT10/14/242:40 PM

## 2023-07-28 ENCOUNTER — Other Ambulatory Visit (HOSPITAL_COMMUNITY): Payer: Self-pay

## 2023-07-28 ENCOUNTER — Other Ambulatory Visit (HOSPITAL_COMMUNITY)
Admission: RE | Admit: 2023-07-28 | Discharge: 2023-07-28 | Disposition: A | Discharge status: Home / Self Care | Payer: 59 | Source: Ambulatory Visit | Attending: Obstetrics and Gynecology | Admitting: Obstetrics and Gynecology

## 2023-07-28 ENCOUNTER — Ambulatory Visit (INDEPENDENT_AMBULATORY_CARE_PROVIDER_SITE_OTHER): Payer: 59 | Admitting: Obstetrics and Gynecology

## 2023-07-28 ENCOUNTER — Encounter: Payer: Self-pay | Admitting: Obstetrics and Gynecology

## 2023-07-28 VITALS — BP 118/84 | HR 116 | Ht 62.5 in | Wt 188.0 lb

## 2023-07-28 DIAGNOSIS — Z124 Encounter for screening for malignant neoplasm of cervix: Secondary | ICD-10-CM

## 2023-07-28 DIAGNOSIS — D849 Immunodeficiency, unspecified: Secondary | ICD-10-CM | POA: Diagnosis not present

## 2023-07-28 DIAGNOSIS — Z01419 Encounter for gynecological examination (general) (routine) without abnormal findings: Secondary | ICD-10-CM

## 2023-07-28 DIAGNOSIS — Z944 Liver transplant status: Secondary | ICD-10-CM | POA: Diagnosis not present

## 2023-07-28 DIAGNOSIS — B259 Cytomegaloviral disease, unspecified: Secondary | ICD-10-CM | POA: Diagnosis not present

## 2023-07-28 MED ORDER — TACROLIMUS 1 MG PO CAPS
ORAL_CAPSULE | ORAL | 11 refills | Status: DC
  Filled 2023-07-28: qty 270, 30d supply, fill #0

## 2023-07-28 NOTE — Patient Instructions (Signed)

## 2023-07-29 ENCOUNTER — Other Ambulatory Visit (HOSPITAL_COMMUNITY): Payer: Self-pay

## 2023-07-29 DIAGNOSIS — Z944 Liver transplant status: Secondary | ICD-10-CM | POA: Diagnosis not present

## 2023-07-29 DIAGNOSIS — B259 Cytomegaloviral disease, unspecified: Secondary | ICD-10-CM | POA: Diagnosis not present

## 2023-07-29 DIAGNOSIS — D849 Immunodeficiency, unspecified: Secondary | ICD-10-CM | POA: Diagnosis not present

## 2023-07-29 LAB — CYTOLOGY - PAP
Adequacy: ABSENT
Comment: NEGATIVE
Diagnosis: NEGATIVE
High risk HPV: NEGATIVE

## 2023-07-30 ENCOUNTER — Other Ambulatory Visit (HOSPITAL_COMMUNITY): Payer: Self-pay

## 2023-08-02 ENCOUNTER — Ambulatory Visit: Payer: 59

## 2023-08-02 ENCOUNTER — Other Ambulatory Visit: Payer: Self-pay | Admitting: Family Medicine

## 2023-08-02 ENCOUNTER — Other Ambulatory Visit (HOSPITAL_COMMUNITY): Payer: Self-pay

## 2023-08-02 DIAGNOSIS — T8641 Liver transplant rejection: Secondary | ICD-10-CM | POA: Diagnosis not present

## 2023-08-02 DIAGNOSIS — R279 Unspecified lack of coordination: Secondary | ICD-10-CM

## 2023-08-02 DIAGNOSIS — R293 Abnormal posture: Secondary | ICD-10-CM

## 2023-08-02 DIAGNOSIS — M6281 Muscle weakness (generalized): Secondary | ICD-10-CM

## 2023-08-02 DIAGNOSIS — D849 Immunodeficiency, unspecified: Secondary | ICD-10-CM | POA: Diagnosis not present

## 2023-08-02 MED ORDER — INSUPEN PEN NEEDLES 31G X 5 MM MISC
1 refills | Status: DC
  Filled 2023-08-02: qty 100, 30d supply, fill #0

## 2023-08-02 NOTE — Telephone Encounter (Signed)
On med list as a historical entry, please advise

## 2023-08-02 NOTE — Therapy (Signed)
OUTPATIENT PHYSICAL THERAPY FEMALE PELVIC TREATMENT   Patient Name: Jamie Coleman MRN: [id number] DOB:[birth year], 52 y.o., female Today's Date: 08/02/2023  END OF SESSION:  PT End of Session - 08/02/23 1109     Visit Number 2    Date for PT Re-Evaluation 01/10/24    Authorization Type [employer] employee    PT Start Time 1106    PT Stop Time 1144    PT Time Calculation (min) 38 min    Activity Tolerance Patient tolerated treatment well    Behavior During Therapy [facility] for tasks assessed/performed              Past Medical History:  Diagnosis Date   Allergy    allergic rhinitis   Anxiety    situational   COVID 05/2021   Difficult intubation    states 2012 ablation surgery, had diff intubation,glide scope used, not noted in EPIC anesthesia record   Elevated hemoglobin A1c 08/08/2015   Elevated liver function tests 08/08/2015   GERD (gastroesophageal reflux disease)    Hyperlipidemia    Low serum vitamin D 08/08/2015   Status post endometrial ablation    Tennis elbow    currently in PT-10/16   Past Surgical History:  Procedure Laterality Date   BIOPSY  02/09/2022   Procedure: BIOPSY;  Surgeon: Jamie Lofty., MD;  Location: [facility];  Service: Gastroenterology;;   CESAREAN SECTION     COLONOSCOPY WITH PROPOFOL N/A 02/09/2022   Procedure: COLONOSCOPY WITH PROPOFOL;  Surgeon: Jamie Lofty., MD;  Location: [facility];  Service: Gastroenterology;  Laterality: N/A;   ENDOMETRIAL ABLATION     8/12   ESOPHAGOGASTRODUODENOSCOPY (EGD) WITH PROPOFOL N/A 02/09/2022   Procedure: ESOPHAGOGASTRODUODENOSCOPY (EGD) WITH PROPOFOL;  Surgeon: Jamie Score Netty Starring., MD;  Location: [facility];  Service: Gastroenterology;  Laterality: N/A;   EXTREMITY WIRE/PIN REMOVAL Left 07/16/2016   Procedure: PIN REMOVAL OF LEFT WRIST X 2;  Surgeon: Jamie Severin, MD;  Location: [facility];  Service: Orthopedics;  Laterality: Left;   FOOT SURGERY  Left    LAPAROSCOPIC TUBAL LIGATION  06/09/2011   Procedure: LAPAROSCOPIC TUBAL LIGATION;  Surgeon: Jamie Coleman;  Location: [facility];  Service: Gynecology;  Laterality: N/A;   LIGAMENT REPAIR Left 05/08/2016   Procedure: LEFT WRIST SCAPHOLUNATE LIGAMENT RECONSTRUCTION WITH TENDON GRAFT PIN NEURECTOMY AND REPAIR;  Surgeon: Jamie Severin, MD;  Location: [facility];  Service: Orthopedics;  Laterality: Left;   LIVER TRANSPLANT  09/2022   POLYPECTOMY  02/09/2022   Procedure: POLYPECTOMY;  Surgeon: Mansouraty, Netty Starring., MD;  Location: [facility];  Service: Gastroenterology;;   TONSILLECTOMY     as child   Patient Active Problem List   Diagnosis Date Noted   On prednisone therapy 04/29/2023   Abdominal weakness 04/29/2023   Multiple joint pain 02/15/2023   Menopause 02/15/2023   H/O liver transplant (HCC) 02/15/2023   Estrogen deficiency 09/24/2022   Acute on chronic alcoholic liver disease (HCC) 09/22/2022   Decompensation of cirrhosis of liver (HCC) 09/22/2022   History of alcoholic hepatitis 09/22/2022   Cirrhosis, alcoholic (HCC) 09/02/2022   Macrocytosis without anemia 09/02/2022   Acute liver failure without hepatic coma 09/01/2022   Bronchitis due to COVID-19 virus 05/05/2022   Elevated liver transaminase level 09/03/2019   Subclinical hypothyroidism 09/03/2019   Vitamin D deficiency 03/02/2018   Fatigue 03/02/2018   Pupil asymmetry 08/04/2016   Hemorrhoids 12/28/2014   Steroid-induced diabetes mellitus (HCC) 08/28/2014   Tremor 06/30/2013  Sebaceous cyst 02/15/2013   OTHER&UNSPECIFIED DISEASES THE ORAL SOFT TISSUES 07/25/2008   DERMATOFIBROMA, ARM 11/11/2007   NEOPLASM, SKIN, UNCERTAIN BEHAVIOR 02/11/2007   Hyperlipidemia 02/10/2007   Anxiety disorder 02/10/2007   Allergic rhinitis 02/10/2007   GERD 02/10/2007   IBS 02/10/2007    PCP: Jamie Pimple, MD  REFERRING PROVIDER: Judy Pimple, MD  REFERRING DIAG: R19.8 (ICD-10-CM) - Abdominal  weakness  THERAPY DIAG:  Muscle weakness (generalized)  Abnormal posture  Unspecified lack of coordination  Rationale for Evaluation and Treatment: Rehabilitation  ONSET DATE: December 2023  SUBJECTIVE:                                                                                                                                                                                           SUBJECTIVE STATEMENT: Pt states that she is having a hard week and is very fatigued. She feels like she is very weak.   PAIN:  Are you having pain? No   PRECAUTIONS: Other: Liver transplant December 2023, c-section  RED FLAGS: None   WEIGHT BEARING RESTRICTIONS: No  FALLS:  Has patient fallen in last 6 months? No  LIVING ENVIRONMENT: Lives with: lives with their family Lives in: House/apartment   OCCUPATION: nurse in GI  PLOF: Independent  PATIENT GOALS: to learn safe abdominal exercises   PERTINENT HISTORY:  Liver transplant, c-section   BOWEL MOVEMENT: Pain with bowel movement: No Type of bowel movement:Frequency 1-2x/day and Strain No Fully empty rectum: Yes: - Leakage: No Pads: No Fiber supplement: No  URINATION: Pain with urination: No Fully empty bladder: Yes: - Stream: Strong Urgency: No Frequency: every 3-4 hours  Leakage:  none Pads: No  INTERCOURSE: Pain with intercourse:  no pain   PREGNANCY: Vaginal deliveries no  Tearing No C-section deliveries 1 - twins - abdominal muscle tearing Currently pregnant No  PROLAPSE: None   OBJECTIVE:  Note: Objective measures were completed at Evaluation unless otherwise noted. 07/26/23: COGNITION: Overall cognitive status: Within functional limits for tasks assessed     SENSATION: Light touch: Appears intact Proprioception: Appears intact   FUNCTIONAL TESTS:  Squat: Rt weight shift with Lt trunk rotation Single leg stance:  Rt: unstable, shaking, pelvic drop  Lt: unstable, shaking, pelvic drop Chin  up test: significant difficulty, doming in lower abdominals ASLR: (-) bil, but shaking evident in bil LE Sit-up test: unable 0/3 Diastasis recti abdominus: 2 finger width separation with distortion inferior to umbilicus  GAIT: Comments: decreased trunk rotation  POSTURE: rounded shoulders, forward head, decreased lumbar lordosis, increased thoracic kyphosis, posterior pelvic tilt, and -  LUMBARAROM/PROM:  A/PROM A/PROM  Eval %  available  Flexion 100  Extension 75  Right lateral flexion 50  Left lateral flexion 75  Right rotation 75, pain under Rt ribs  Left rotation 100   (Blank rows = not tested)   PALPATION:   General  Significant restriction throughout upper abdominals  TODAY'S TREATMENT:                                                                                                                              DATE:  08/02/23 Manual: Abdominal scar tissue mobilization in supine Abdominal Instrument assisted soft tissue mobilization to scar tissue and for myofascial release in supine Diaphragm release bil (Lt>Rt) Exercises: Lower trunk rotation 2 x 10 Cat cow 2 x 10 Child's pose 10 breaths Seated lateral flexion over peanut 10 breaths bil   07/26/23  EVAL  Neuromuscular re-education: Transversus abdominus training with multimodal cues for improved motor control and breath coordination Bil UE ball press 10x Side lying UE ball press 5x bil Exercises: Lower trunk rotation 10x Open books 5x bil   PATIENT EDUCATION:  Education details: See above Person educated: Patient Education method: Programmer, multimedia, Demonstration, Tactile cues, Verbal cues, and Handouts Education comprehension: verbalized understanding  HOME EXERCISE PROGRAM: [id number]  ASSESSMENT:  CLINICAL IMPRESSION: Today abdominal scar tissue mobilization performed manually and with use of negative pressure suction cup to improve mobility. Pt states that she can even feel scar tissue restriction due  to increase in fluid in abdomen with prednisone. She states that this felt much better after manual techniques today. We discussed techniques for working on this at home. She did well with mobility progressions today and HEP updated. We will plan to perform more core progressions next session. She will continue to benefit from skilled PT intervention in order to improve core strength and begin/progress functional strengthening program.   OBJECTIVE IMPAIRMENTS: decreased activity tolerance, decreased coordination, decreased endurance, decreased mobility, decreased strength, increased fascial restrictions, increased muscle spasms, impaired tone, postural dysfunction, and pain.   ACTIVITY LIMITATIONS: lifting, bending, sitting, squatting, and bed mobility  PARTICIPATION LIMITATIONS: community activity and occupation  PERSONAL FACTORS: 1 comorbidity: medical history  are also affecting patient's functional outcome.   REHAB POTENTIAL: Good  CLINICAL DECISION MAKING: Stable/uncomplicated  EVALUATION COMPLEXITY: Low   GOALS: Goals reviewed with patient? Yes  SHORT TERM GOALS: Target date: 08/23/23  Pt will be independent with HEP.   Baseline: Goal status: INITIAL  2.  Pt will be able to perform active transversus abdominus contraction with appropriate pressure management and breathing.  Baseline:  Goal status: INITIAL  3.  Pt will be independent with self scar tissue mobilization.  Baseline:  Goal status: INITIAL    LONG TERM GOALS: Target date: 01/10/2024  Pt will be independent with advanced HEP.   Baseline:  Goal status: INITIAL  2.  Pt will be able to sit up in bed and perform bed mobility without abdominal distortion.  Baseline:  Goal status: INITIAL  3.  Pt will be able to score 2/3 on sit-up test without abdominal distortion. Baseline:  Goal status: INITIAL   PLAN:  PT FREQUENCY: 1-2x/week  PT DURATION: 8 weeks  PLANNED INTERVENTIONS: 97110-Therapeutic  exercises, 97530- Therapeutic activity, 97112- Neuromuscular re-education, 97535- Self Care, [id number]- Manual therapy, Patient/Family education, Dry Needling, Biofeedback, and 97146- PT Re-evaluation  PLAN FOR NEXT SESSION: Plan to progress core strengthening; mobility exercises   Julio Alm, PT, DPT10/21/2411:47 AM

## 2023-08-03 ENCOUNTER — Other Ambulatory Visit: Payer: Self-pay

## 2023-08-03 ENCOUNTER — Other Ambulatory Visit (HOSPITAL_COMMUNITY): Payer: Self-pay

## 2023-08-03 MED ORDER — TACROLIMUS 1 MG PO CAPS
3.0000 mg | ORAL_CAPSULE | Freq: Two times a day (BID) | ORAL | 11 refills | Status: DC
  Filled 2023-08-03: qty 180, 30d supply, fill #0

## 2023-08-03 MED ORDER — PREDNISONE 20 MG PO TABS
40.0000 mg | ORAL_TABLET | Freq: Every morning | ORAL | 11 refills | Status: DC
  Filled 2023-08-03: qty 60, 30d supply, fill #0

## 2023-08-03 MED ORDER — PREDNISONE 5 MG PO TABS
15.0000 mg | ORAL_TABLET | Freq: Every morning | ORAL | 11 refills | Status: DC
  Filled 2023-08-03: qty 90, 30d supply, fill #0

## 2023-08-04 DIAGNOSIS — Z944 Liver transplant status: Secondary | ICD-10-CM | POA: Diagnosis not present

## 2023-08-04 DIAGNOSIS — B259 Cytomegaloviral disease, unspecified: Secondary | ICD-10-CM | POA: Diagnosis not present

## 2023-08-04 DIAGNOSIS — T8641 Liver transplant rejection: Secondary | ICD-10-CM | POA: Diagnosis not present

## 2023-08-04 DIAGNOSIS — D849 Immunodeficiency, unspecified: Secondary | ICD-10-CM | POA: Diagnosis not present

## 2023-08-04 DIAGNOSIS — R7401 Elevation of levels of liver transaminase levels: Secondary | ICD-10-CM | POA: Diagnosis not present

## 2023-08-05 DIAGNOSIS — D849 Immunodeficiency, unspecified: Secondary | ICD-10-CM | POA: Diagnosis not present

## 2023-08-05 DIAGNOSIS — Z944 Liver transplant status: Secondary | ICD-10-CM | POA: Diagnosis not present

## 2023-08-05 DIAGNOSIS — B259 Cytomegaloviral disease, unspecified: Secondary | ICD-10-CM | POA: Diagnosis not present

## 2023-08-06 ENCOUNTER — Other Ambulatory Visit (HOSPITAL_COMMUNITY): Payer: Self-pay

## 2023-08-06 MED ORDER — ACYCLOVIR 400 MG PO TABS
400.0000 mg | ORAL_TABLET | Freq: Two times a day (BID) | ORAL | 0 refills | Status: DC
  Filled 2023-08-06: qty 60, 30d supply, fill #0

## 2023-08-07 ENCOUNTER — Other Ambulatory Visit (HOSPITAL_COMMUNITY): Payer: Self-pay

## 2023-08-07 MED ORDER — TACROLIMUS 1 MG PO CAPS
ORAL_CAPSULE | ORAL | 11 refills | Status: DC
  Filled 2023-08-07: qty 120, 30d supply, fill #0

## 2023-08-09 DIAGNOSIS — D849 Immunodeficiency, unspecified: Secondary | ICD-10-CM | POA: Diagnosis not present

## 2023-08-09 DIAGNOSIS — T8641 Liver transplant rejection: Secondary | ICD-10-CM | POA: Diagnosis not present

## 2023-08-09 DIAGNOSIS — B259 Cytomegaloviral disease, unspecified: Secondary | ICD-10-CM | POA: Diagnosis not present

## 2023-08-10 ENCOUNTER — Other Ambulatory Visit (HOSPITAL_COMMUNITY): Payer: Self-pay

## 2023-08-10 ENCOUNTER — Other Ambulatory Visit: Payer: Self-pay

## 2023-08-10 ENCOUNTER — Ambulatory Visit: Payer: 59

## 2023-08-10 DIAGNOSIS — M6281 Muscle weakness (generalized): Secondary | ICD-10-CM | POA: Diagnosis not present

## 2023-08-10 DIAGNOSIS — R279 Unspecified lack of coordination: Secondary | ICD-10-CM | POA: Diagnosis not present

## 2023-08-10 DIAGNOSIS — R293 Abnormal posture: Secondary | ICD-10-CM

## 2023-08-10 MED ORDER — TACROLIMUS 1 MG PO CAPS: ORAL_CAPSULE | ORAL | 11 refills | Status: DC

## 2023-08-10 MED ORDER — PREDNISONE 5 MG PO TABS: 10.0000 mg | ORAL_TABLET | Freq: Every morning | ORAL | 11 refills | Status: DC

## 2023-08-10 MED ORDER — PREDNISONE 20 MG PO TABS
40.0000 mg | ORAL_TABLET | Freq: Every morning | ORAL | 11 refills | Status: DC
  Filled 2023-08-10: qty 60, 30d supply, fill #0

## 2023-08-10 NOTE — Therapy (Signed)
OUTPATIENT PHYSICAL THERAPY FEMALE PELVIC TREATMENT   Patient Name: Jamie Coleman MRN: [id number] DOB:December 16, 1970, 52 y.o., female Today's Date: 08/10/2023  END OF SESSION:  PT End of Session - 08/10/23 1404     Visit Number 3    Date for PT Re-Evaluation 01/10/24    Authorization Type [employer] employee    PT Start Time 1400    PT Stop Time 1440    PT Time Calculation (min) 40 min    Activity Tolerance Patient tolerated treatment well    Behavior During Therapy [facility] for tasks assessed/performed              Past Medical History:  Diagnosis Date   Allergy    allergic rhinitis   Anxiety    situational   COVID 05/2021   Difficult intubation    states 2012 ablation surgery, had diff intubation,glide scope used, not noted in EPIC anesthesia record   Elevated hemoglobin A1c 08/08/2015   Elevated liver function tests 08/08/2015   GERD (gastroesophageal reflux disease)    Hyperlipidemia    Low serum vitamin D 08/08/2015   Status post endometrial ablation    Tennis elbow    currently in PT-10/16   Past Surgical History:  Procedure Laterality Date   BIOPSY  02/09/2022   Procedure: BIOPSY;  Surgeon: Lemar Lofty., MD;  Location: [facility];  Service: Gastroenterology;;   CESAREAN SECTION     COLONOSCOPY WITH PROPOFOL N/A 02/09/2022   Procedure: COLONOSCOPY WITH PROPOFOL;  Surgeon: Lemar Lofty., MD;  Location: [facility];  Service: Gastroenterology;  Laterality: N/A;   ENDOMETRIAL ABLATION     8/12   ESOPHAGOGASTRODUODENOSCOPY (EGD) WITH PROPOFOL N/A 02/09/2022   Procedure: ESOPHAGOGASTRODUODENOSCOPY (EGD) WITH PROPOFOL;  Surgeon: Meridee Score Netty Starring., MD;  Location: [facility];  Service: Gastroenterology;  Laterality: N/A;   EXTREMITY WIRE/PIN REMOVAL Left 07/16/2016   Procedure: PIN REMOVAL OF LEFT WRIST X 2;  Surgeon: Dominica Severin, MD;  Location: [facility];  Service: Orthopedics;  Laterality: Left;   FOOT SURGERY  Left    LAPAROSCOPIC TUBAL LIGATION  06/09/2011   Procedure: LAPAROSCOPIC TUBAL LIGATION;  Surgeon: Melony Overly;  Location: [facility];  Service: Gynecology;  Laterality: N/A;   LIGAMENT REPAIR Left 05/08/2016   Procedure: LEFT WRIST SCAPHOLUNATE LIGAMENT RECONSTRUCTION WITH TENDON GRAFT PIN NEURECTOMY AND REPAIR;  Surgeon: Dominica Severin, MD;  Location: [facility];  Service: Orthopedics;  Laterality: Left;   LIVER TRANSPLANT  09/2022   POLYPECTOMY  02/09/2022   Procedure: POLYPECTOMY;  Surgeon: Mansouraty, Netty Starring., MD;  Location: [facility];  Service: Gastroenterology;;   TONSILLECTOMY     as child   Patient Active Problem List   Diagnosis Date Noted   On prednisone therapy 04/29/2023   Abdominal weakness 04/29/2023   Multiple joint pain 02/15/2023   Menopause 02/15/2023   H/O liver transplant (HCC) 02/15/2023   Estrogen deficiency 09/24/2022   Acute on chronic alcoholic liver disease (HCC) 09/22/2022   Decompensation of cirrhosis of liver (HCC) 09/22/2022   History of alcoholic hepatitis 09/22/2022   Cirrhosis, alcoholic (HCC) 09/02/2022   Macrocytosis without anemia 09/02/2022   Acute liver failure without hepatic coma 09/01/2022   Bronchitis due to COVID-19 virus 05/05/2022   Elevated liver transaminase level 09/03/2019   Subclinical hypothyroidism 09/03/2019   Vitamin D deficiency 03/02/2018   Fatigue 03/02/2018   Pupil asymmetry 08/04/2016   Hemorrhoids 12/28/2014   Steroid-induced diabetes mellitus (HCC) 08/28/2014   Tremor 06/30/2013  Sebaceous cyst 02/15/2013   OTHER&UNSPECIFIED DISEASES THE ORAL SOFT TISSUES 07/25/2008   DERMATOFIBROMA, ARM 11/11/2007   NEOPLASM, SKIN, UNCERTAIN BEHAVIOR 02/11/2007   Hyperlipidemia 02/10/2007   Anxiety disorder 02/10/2007   Allergic rhinitis 02/10/2007   GERD 02/10/2007   IBS 02/10/2007    PCP: Judy Pimple, MD  REFERRING PROVIDER: Judy Pimple, MD  REFERRING DIAG: R19.8 (ICD-10-CM) - Abdominal  weakness  THERAPY DIAG:  Muscle weakness (generalized)  Abnormal posture  Unspecified lack of coordination  Rationale for Evaluation and Treatment: Rehabilitation  ONSET DATE: December 2023  SUBJECTIVE:                                                                                                                                                                                           SUBJECTIVE STATEMENT: Pt reports having a little bit better week this week. She has been stretching daily, but not working on strengthening as often. She feels like scar tissue was better after last session.   PAIN:  Are you having pain? No   PRECAUTIONS: Other: Liver transplant December 2023, c-section  RED FLAGS: None   WEIGHT BEARING RESTRICTIONS: No  FALLS:  Has patient fallen in last 6 months? No  LIVING ENVIRONMENT: Lives with: lives with their family Lives in: House/apartment   OCCUPATION: nurse in GI  PLOF: Independent  PATIENT GOALS: to learn safe abdominal exercises   PERTINENT HISTORY:  Liver transplant, c-section   BOWEL MOVEMENT: Pain with bowel movement: No Type of bowel movement:Frequency 1-2x/day and Strain No Fully empty rectum: Yes: - Leakage: No Pads: No Fiber supplement: No  URINATION: Pain with urination: No Fully empty bladder: Yes: - Stream: Strong Urgency: No Frequency: every 3-4 hours  Leakage:  none Pads: No  INTERCOURSE: Pain with intercourse:  no pain   PREGNANCY: Vaginal deliveries no  Tearing No C-section deliveries 1 - twins - abdominal muscle tearing Currently pregnant No  PROLAPSE: None   OBJECTIVE:  Note: Objective measures were completed at Evaluation unless otherwise noted. 07/26/23: COGNITION: Overall cognitive status: Within functional limits for tasks assessed     SENSATION: Light touch: Appears intact Proprioception: Appears intact   FUNCTIONAL TESTS:  Squat: Rt weight shift with Lt trunk rotation Single  leg stance:  Rt: unstable, shaking, pelvic drop  Lt: unstable, shaking, pelvic drop Chin up test: significant difficulty, doming in lower abdominals ASLR: (-) bil, but shaking evident in bil LE Sit-up test: unable 0/3 Diastasis recti abdominus: 2 finger width separation with distortion inferior to umbilicus  GAIT: Comments: decreased trunk rotation  POSTURE: rounded shoulders, forward head, decreased lumbar lordosis, increased thoracic kyphosis, posterior  pelvic tilt, and -  LUMBARAROM/PROM:  A/PROM A/PROM  Eval % available  Flexion 100  Extension 75  Right lateral flexion 50  Left lateral flexion 75  Right rotation 75, pain under Rt ribs  Left rotation 100   (Blank rows = not tested)   PALPATION:   General  Significant restriction throughout upper abdominals  TODAY'S TREATMENT:                                                                                                                              DATE:  08/10/23 Manual: Abdominal scar tissue mobilization in supine Diaphragm release bil (Lt>Rt) Neuromuscular re-education: Hip adduction supine 10x with transversus abdominus  Bridge with hip adduction and transversus abdominus 2 x 10 Clam shells 10x bil   08/02/23 Manual: Abdominal scar tissue mobilization in supine Abdominal Instrument assisted soft tissue mobilization to scar tissue and for myofascial release in supine Diaphragm release bil (Lt>Rt) Exercises: Lower trunk rotation 2 x 10 Cat cow 2 x 10 Child's pose 10 breaths Seated lateral flexion over peanut 10 breaths bil   07/26/23  EVAL  Neuromuscular re-education: Transversus abdominus training with multimodal cues for improved motor control and breath coordination Bil UE ball press 10x Side lying UE ball press 5x bil Exercises: Lower trunk rotation 10x Open books 5x bil   PATIENT EDUCATION:  Education details: See above Person educated: Patient Education method: Programmer, multimedia, Demonstration,  Tactile cues, Verbal cues, and Handouts Education comprehension: verbalized understanding  HOME EXERCISE PROGRAM: [id number]  ASSESSMENT:  CLINICAL IMPRESSION: Pt feeling better this week with less scar tissue restriction during movement. Manual techniques continued to abdomen to further improve scar tissue since restriction still present and likely impacting abdominal activation. She tolerated well and did have improvement throughout session. Strengthening and mobility exercises progressed to tolerance and to provide appropriate challenge without over fatiguing patient. She will continue to benefit from skilled PT intervention in order to improve core strength and begin/progress functional strengthening program.   OBJECTIVE IMPAIRMENTS: decreased activity tolerance, decreased coordination, decreased endurance, decreased mobility, decreased strength, increased fascial restrictions, increased muscle spasms, impaired tone, postural dysfunction, and pain.   ACTIVITY LIMITATIONS: lifting, bending, sitting, squatting, and bed mobility  PARTICIPATION LIMITATIONS: community activity and occupation  PERSONAL FACTORS: 1 comorbidity: medical history  are also affecting patient's functional outcome.   REHAB POTENTIAL: Good  CLINICAL DECISION MAKING: Stable/uncomplicated  EVALUATION COMPLEXITY: Low   GOALS: Goals reviewed with patient? Yes  SHORT TERM GOALS: Target date: 08/23/23  Pt will be independent with HEP.   Baseline: Goal status: INITIAL  2.  Pt will be able to perform active transversus abdominus contraction with appropriate pressure management and breathing.  Baseline:  Goal status: INITIAL  3.  Pt will be independent with self scar tissue mobilization.  Baseline:  Goal status: INITIAL    LONG TERM GOALS: Target date: 01/10/2024  Pt will be independent with advanced HEP.   Baseline:  Goal  status: INITIAL  2.  Pt will be able to sit up in bed and perform bed mobility  without abdominal distortion.  Baseline:  Goal status: INITIAL  3.  Pt will be able to score 2/3 on sit-up test without abdominal distortion. Baseline:  Goal status: INITIAL   PLAN:  PT FREQUENCY: 1-2x/week  PT DURATION: 8 weeks  PLANNED INTERVENTIONS: 97110-Therapeutic exercises, 97530- Therapeutic activity, 97112- Neuromuscular re-education, 97535- Self Care, 75643- Manual therapy, Patient/Family education, Dry Needling, Biofeedback, and 97146- PT Re-evaluation  PLAN FOR NEXT SESSION: Plan to progress core strengthening; mobility exercises   Julio Alm, PT, DPT10/29/242:41 PM

## 2023-08-11 ENCOUNTER — Other Ambulatory Visit (HOSPITAL_COMMUNITY): Payer: Self-pay

## 2023-08-11 DIAGNOSIS — Z944 Liver transplant status: Secondary | ICD-10-CM | POA: Diagnosis not present

## 2023-08-11 DIAGNOSIS — B259 Cytomegaloviral disease, unspecified: Secondary | ICD-10-CM | POA: Diagnosis not present

## 2023-08-11 DIAGNOSIS — D849 Immunodeficiency, unspecified: Secondary | ICD-10-CM | POA: Diagnosis not present

## 2023-08-12 DIAGNOSIS — T8641 Liver transplant rejection: Secondary | ICD-10-CM | POA: Diagnosis not present

## 2023-08-12 DIAGNOSIS — B259 Cytomegaloviral disease, unspecified: Secondary | ICD-10-CM | POA: Diagnosis not present

## 2023-08-12 DIAGNOSIS — D849 Immunodeficiency, unspecified: Secondary | ICD-10-CM | POA: Diagnosis not present

## 2023-08-13 ENCOUNTER — Other Ambulatory Visit (HOSPITAL_COMMUNITY): Payer: Self-pay

## 2023-08-13 DIAGNOSIS — T380X5A Adverse effect of glucocorticoids and synthetic analogues, initial encounter: Secondary | ICD-10-CM | POA: Diagnosis not present

## 2023-08-13 DIAGNOSIS — Z944 Liver transplant status: Secondary | ICD-10-CM | POA: Diagnosis not present

## 2023-08-13 DIAGNOSIS — R739 Hyperglycemia, unspecified: Secondary | ICD-10-CM | POA: Diagnosis not present

## 2023-08-13 MED ORDER — INSULIN PEN NEEDLE 31G X 5 MM MISC
3 refills | Status: DC
  Filled 2023-08-13: qty 100, 25d supply, fill #0
  Filled 2023-09-22: qty 100, 30d supply, fill #0
  Filled 2023-11-12: qty 100, 30d supply, fill #1

## 2023-08-13 MED ORDER — INSULIN GLARGINE-YFGN 100 UNIT/ML ~~LOC~~ SOLN
10.0000 [IU] | Freq: Every day | SUBCUTANEOUS | 3 refills | Status: DC
  Filled 2023-08-13: qty 10, 90d supply, fill #0
  Filled 2023-08-16: qty 10, 28d supply, fill #0
  Filled 2023-09-08: qty 10, 28d supply, fill #1
  Filled 2023-10-08: qty 10, 28d supply, fill #2

## 2023-08-13 MED ORDER — FREESTYLE LIBRE 2 SENSOR MISC
5 refills | Status: DC
  Filled 2023-08-13: qty 2, 28d supply, fill #0
  Filled 2023-09-08: qty 2, 28d supply, fill #1
  Filled 2023-10-08: qty 2, 28d supply, fill #2
  Filled 2023-10-29: qty 2, 28d supply, fill #3
  Filled 2023-11-22: qty 2, 28d supply, fill #4
  Filled 2023-12-31: qty 2, 28d supply, fill #5

## 2023-08-13 MED ORDER — FREESTYLE LIBRE 2 READER DEVI
1 refills | Status: DC
  Filled 2023-08-13: qty 1, 30d supply, fill #0

## 2023-08-13 MED ORDER — INSULIN DEGLUDEC 100 UNIT/ML ~~LOC~~ SOLN
10.0000 [IU] | Freq: Every evening | SUBCUTANEOUS | 3 refills | Status: DC
  Filled 2023-08-13: qty 10, 56d supply, fill #0

## 2023-08-13 MED ORDER — INSULIN LISPRO (1 UNIT DIAL) 100 UNIT/ML (KWIKPEN)
PEN_INJECTOR | SUBCUTANEOUS | 2 refills | Status: DC
  Filled 2023-08-13 – 2023-09-07 (×2): qty 15, 14d supply, fill #0
  Filled 2023-09-22: qty 15, 14d supply, fill #1
  Filled 2023-10-08: qty 15, 14d supply, fill #2

## 2023-08-16 ENCOUNTER — Other Ambulatory Visit (HOSPITAL_COMMUNITY): Payer: Self-pay

## 2023-08-16 ENCOUNTER — Other Ambulatory Visit: Payer: Self-pay

## 2023-08-16 DIAGNOSIS — D849 Immunodeficiency, unspecified: Secondary | ICD-10-CM | POA: Diagnosis not present

## 2023-08-16 DIAGNOSIS — B259 Cytomegaloviral disease, unspecified: Secondary | ICD-10-CM | POA: Diagnosis not present

## 2023-08-16 DIAGNOSIS — Z944 Liver transplant status: Secondary | ICD-10-CM | POA: Diagnosis not present

## 2023-08-16 MED ORDER — VALGANCICLOVIR HCL 450 MG PO TABS
ORAL_TABLET | ORAL | 11 refills | Status: DC
  Filled 2023-08-16: qty 120, 30d supply, fill #0

## 2023-08-17 ENCOUNTER — Other Ambulatory Visit (HOSPITAL_COMMUNITY): Payer: Self-pay

## 2023-08-18 ENCOUNTER — Ambulatory Visit: Payer: 59 | Attending: Family Medicine

## 2023-08-18 DIAGNOSIS — M6281 Muscle weakness (generalized): Secondary | ICD-10-CM | POA: Diagnosis not present

## 2023-08-18 DIAGNOSIS — R279 Unspecified lack of coordination: Secondary | ICD-10-CM | POA: Insufficient documentation

## 2023-08-18 DIAGNOSIS — B259 Cytomegaloviral disease, unspecified: Secondary | ICD-10-CM | POA: Diagnosis not present

## 2023-08-18 DIAGNOSIS — D849 Immunodeficiency, unspecified: Secondary | ICD-10-CM | POA: Diagnosis not present

## 2023-08-18 DIAGNOSIS — R293 Abnormal posture: Secondary | ICD-10-CM | POA: Insufficient documentation

## 2023-08-18 DIAGNOSIS — Z944 Liver transplant status: Secondary | ICD-10-CM | POA: Diagnosis not present

## 2023-08-18 NOTE — Therapy (Signed)
OUTPATIENT PHYSICAL THERAPY FEMALE PELVIC TREATMENT   Patient Name: Jamie Coleman MRN: [id number] DOB:[birth year], 52 y.o., female Today's Date: 08/18/2023  END OF SESSION:  PT End of Session - 08/18/23 1529     Visit Number 4    Date for PT Re-Evaluation 01/10/24    Authorization Type [employer] employee    PT Start Time 1530    PT Stop Time 1610    PT Time Calculation (min) 40 min    Activity Tolerance Patient tolerated treatment well    Behavior During Therapy [facility] for tasks assessed/performed              Past Medical History:  Diagnosis Date   Allergy    allergic rhinitis   Anxiety    situational   COVID 05/2021   Difficult intubation    states 2012 ablation surgery, had diff intubation,glide scope used, not noted in EPIC anesthesia record   Elevated hemoglobin A1c 08/08/2015   Elevated liver function tests 08/08/2015   GERD (gastroesophageal reflux disease)    Hyperlipidemia    Low serum vitamin D 08/08/2015   Status post endometrial ablation    Tennis elbow    currently in PT-10/16   Past Surgical History:  Procedure Laterality Date   BIOPSY  02/09/2022   Procedure: BIOPSY;  Surgeon: Lemar Lofty., MD;  Location: [facility];  Service: Gastroenterology;;   CESAREAN SECTION     COLONOSCOPY WITH PROPOFOL N/A 02/09/2022   Procedure: COLONOSCOPY WITH PROPOFOL;  Surgeon: Lemar Lofty., MD;  Location: [facility];  Service: Gastroenterology;  Laterality: N/A;   ENDOMETRIAL ABLATION     8/12   ESOPHAGOGASTRODUODENOSCOPY (EGD) WITH PROPOFOL N/A 02/09/2022   Procedure: ESOPHAGOGASTRODUODENOSCOPY (EGD) WITH PROPOFOL;  Surgeon: Meridee Score Netty Starring., MD;  Location: [facility];  Service: Gastroenterology;  Laterality: N/A;   EXTREMITY WIRE/PIN REMOVAL Left 07/16/2016   Procedure: PIN REMOVAL OF LEFT WRIST X 2;  Surgeon: Dominica Severin, MD;  Location: [facility];  Service: Orthopedics;  Laterality: Left;   FOOT SURGERY Left     LAPAROSCOPIC TUBAL LIGATION  06/09/2011   Procedure: LAPAROSCOPIC TUBAL LIGATION;  Surgeon: Melony Overly;  Location: [facility];  Service: Gynecology;  Laterality: N/A;   LIGAMENT REPAIR Left 05/08/2016   Procedure: LEFT WRIST SCAPHOLUNATE LIGAMENT RECONSTRUCTION WITH TENDON GRAFT PIN NEURECTOMY AND REPAIR;  Surgeon: Dominica Severin, MD;  Location: [facility];  Service: Orthopedics;  Laterality: Left;   LIVER TRANSPLANT  09/2022   POLYPECTOMY  02/09/2022   Procedure: POLYPECTOMY;  Surgeon: Mansouraty, Netty Starring., MD;  Location: [facility];  Service: Gastroenterology;;   TONSILLECTOMY     as child   Patient Active Problem List   Diagnosis Date Noted   On prednisone therapy 04/29/2023   Abdominal weakness 04/29/2023   Multiple joint pain 02/15/2023   Menopause 02/15/2023   H/O liver transplant (HCC) 02/15/2023   Estrogen deficiency 09/24/2022   Acute on chronic alcoholic liver disease (HCC) 09/22/2022   Decompensation of cirrhosis of liver (HCC) 09/22/2022   History of alcoholic hepatitis 09/22/2022   Cirrhosis, alcoholic (HCC) 09/02/2022   Macrocytosis without anemia 09/02/2022   Acute liver failure without hepatic coma 09/01/2022   Bronchitis due to COVID-19 virus 05/05/2022   Elevated liver transaminase level 09/03/2019   Subclinical hypothyroidism 09/03/2019   Vitamin D deficiency 03/02/2018   Fatigue 03/02/2018   Pupil asymmetry 08/04/2016   Hemorrhoids 12/28/2014   Steroid-induced diabetes mellitus (HCC) 08/28/2014   Tremor 06/30/2013  Sebaceous cyst 02/15/2013   OTHER&UNSPECIFIED DISEASES THE ORAL SOFT TISSUES 07/25/2008   DERMATOFIBROMA, ARM 11/11/2007   NEOPLASM, SKIN, UNCERTAIN BEHAVIOR 02/11/2007   Hyperlipidemia 02/10/2007   Anxiety disorder 02/10/2007   Allergic rhinitis 02/10/2007   GERD 02/10/2007   IBS 02/10/2007    PCP: Judy Pimple, MD  REFERRING PROVIDER: Judy Pimple, MD  REFERRING DIAG: R19.8 (ICD-10-CM) - Abdominal  weakness  THERAPY DIAG:  Muscle weakness (generalized)  Abnormal posture  Unspecified lack of coordination  Rationale for Evaluation and Treatment: Rehabilitation  ONSET DATE: December 2023  SUBJECTIVE:                                                                                                                                                                                           SUBJECTIVE STATEMENT: Pt states that she did well after last session. She is a little sore today, but feels like she has just been very active the last couple of days. Exercises are still making her a little sore.   PAIN:  Are you having pain? No   PRECAUTIONS: Other: Liver transplant December 2023, c-section  RED FLAGS: None   WEIGHT BEARING RESTRICTIONS: No  FALLS:  Has patient fallen in last 6 months? No  LIVING ENVIRONMENT: Lives with: lives with their family Lives in: House/apartment   OCCUPATION: nurse in GI  PLOF: Independent  PATIENT GOALS: to learn safe abdominal exercises   PERTINENT HISTORY:  Liver transplant, c-section   BOWEL MOVEMENT: Pain with bowel movement: No Type of bowel movement:Frequency 1-2x/day and Strain No Fully empty rectum: Yes: - Leakage: No Pads: No Fiber supplement: No  URINATION: Pain with urination: No Fully empty bladder: Yes: - Stream: Strong Urgency: No Frequency: every 3-4 hours  Leakage:  none Pads: No  INTERCOURSE: Pain with intercourse:  no pain   PREGNANCY: Vaginal deliveries no  Tearing No C-section deliveries 1 - twins - abdominal muscle tearing Currently pregnant No  PROLAPSE: None   OBJECTIVE:  Note: Objective measures were completed at Evaluation unless otherwise noted. 07/26/23: COGNITION: Overall cognitive status: Within functional limits for tasks assessed     SENSATION: Light touch: Appears intact Proprioception: Appears intact   FUNCTIONAL TESTS:  Squat: Rt weight shift with Lt trunk  rotation Single leg stance:  Rt: unstable, shaking, pelvic drop  Lt: unstable, shaking, pelvic drop Chin up test: significant difficulty, doming in lower abdominals ASLR: (-) bil, but shaking evident in bil LE Sit-up test: unable 0/3 Diastasis recti abdominus: 2 finger width separation with distortion inferior to umbilicus  GAIT: Comments: decreased trunk rotation  POSTURE: rounded shoulders, forward head, decreased lumbar  lordosis, increased thoracic kyphosis, posterior pelvic tilt, and -  LUMBARAROM/PROM:  A/PROM A/PROM  Eval % available  Flexion 100  Extension 75  Right lateral flexion 50  Left lateral flexion 75  Right rotation 75, pain under Rt ribs  Left rotation 100   (Blank rows = not tested)   PALPATION:   General  Significant restriction throughout upper abdominals  TODAY'S TREATMENT:                                                                                                                              DATE:  08/18/23 Manual: Abdominal scar tissue mobilization in supine Diaphragm release bil (Lt>Rt) Neuromuscular re-education: Supine pelvic tilts on dynadisc 2 x 10 Bridge with hip adduction, pelvic floor muscle, and transversus abdominus 2 x 10 Exercises: Straight leg raise 10x bil Standing table peanut ball roll outs 10x Hamstring foam rollers in supine 10x Lower trunk rotation 2 x 10  08/10/23 Manual: Abdominal scar tissue mobilization in supine Diaphragm release bil (Lt>Rt) Neuromuscular re-education: Hip adduction supine 10x with transversus abdominus  Bridge with hip adduction and transversus abdominus 2 x 10 Clam shells 10x bil   08/02/23 Manual: Abdominal scar tissue mobilization in supine Abdominal Instrument assisted soft tissue mobilization to scar tissue and for myofascial release in supine Diaphragm release bil (Lt>Rt) Exercises: Lower trunk rotation 2 x 10 Cat cow 2 x 10 Child's pose 10 breaths Seated lateral flexion over  peanut 10 breaths bil   PATIENT EDUCATION:  Education details: See above Person educated: Patient Education method: Explanation, Demonstration, Tactile cues, Verbal cues, and Handouts Education comprehension: verbalized understanding  HOME EXERCISE PROGRAM: [id number]  ASSESSMENT:  CLINICAL IMPRESSION: Pt doing well overall with experiencing just mild soreness after exercises. We continued to perform abdominal scar tissue mobilization. She is starting to regain some sensation with numbness and tingling present in upper abdomen. She is demonstrating less restriction throughout incision, but there is still some present; she tolerated manual techniques well today. Good tolerance to progression of exercises, but she does fatigue quickly. She will continue to benefit from skilled PT intervention in order to improve core strength and begin/progress functional strengthening program.   OBJECTIVE IMPAIRMENTS: decreased activity tolerance, decreased coordination, decreased endurance, decreased mobility, decreased strength, increased fascial restrictions, increased muscle spasms, impaired tone, postural dysfunction, and pain.   ACTIVITY LIMITATIONS: lifting, bending, sitting, squatting, and bed mobility  PARTICIPATION LIMITATIONS: community activity and occupation  PERSONAL FACTORS: 1 comorbidity: medical history  are also affecting patient's functional outcome.   REHAB POTENTIAL: Good  CLINICAL DECISION MAKING: Stable/uncomplicated  EVALUATION COMPLEXITY: Low   GOALS: Goals reviewed with patient? Yes  SHORT TERM GOALS: Target date: 08/23/23  Pt will be independent with HEP.   Baseline: Goal status: INITIAL  2.  Pt will be able to perform active transversus abdominus contraction with appropriate pressure management and breathing.  Baseline:  Goal status: INITIAL  3.  Pt will be independent with  self scar tissue mobilization.  Baseline:  Goal status: INITIAL    LONG TERM GOALS:  Target date: 01/10/2024  Pt will be independent with advanced HEP.   Baseline:  Goal status: INITIAL  2.  Pt will be able to sit up in bed and perform bed mobility without abdominal distortion.  Baseline:  Goal status: INITIAL  3.  Pt will be able to score 2/3 on sit-up test without abdominal distortion. Baseline:  Goal status: INITIAL   PLAN:  PT FREQUENCY: 1-2x/week  PT DURATION: 8 weeks  PLANNED INTERVENTIONS: 97110-Therapeutic exercises, 97530- Therapeutic activity, 97112- Neuromuscular re-education, 97535- Self Care, 13086- Manual therapy, Patient/Family education, Dry Needling, Biofeedback, and 97146- PT Re-evaluation  PLAN FOR NEXT SESSION: Plan to progress core strengthening; mobility exercises   Julio Alm, PT, DPT11/06/244:14 PM

## 2023-08-19 ENCOUNTER — Other Ambulatory Visit (HOSPITAL_COMMUNITY): Payer: Self-pay

## 2023-08-20 ENCOUNTER — Other Ambulatory Visit (HOSPITAL_COMMUNITY): Payer: Self-pay

## 2023-08-20 MED ORDER — PREDNISONE 20 MG PO TABS
40.0000 mg | ORAL_TABLET | Freq: Every day | ORAL | 11 refills | Status: DC
  Filled 2023-08-20 – 2023-08-27 (×2): qty 60, 30d supply, fill #0

## 2023-08-20 MED ORDER — PREDNISONE 5 MG PO TABS
5.0000 mg | ORAL_TABLET | Freq: Every day | ORAL | 11 refills | Status: DC
  Filled 2023-08-20: qty 30, 30d supply, fill #0

## 2023-08-20 MED ORDER — TACROLIMUS 1 MG PO CAPS
5.0000 mg | ORAL_CAPSULE | ORAL | 11 refills | Status: DC
  Filled 2023-08-20: qty 150, 30d supply, fill #0

## 2023-08-23 ENCOUNTER — Encounter: Payer: Self-pay | Admitting: Obstetrics and Gynecology

## 2023-08-23 ENCOUNTER — Other Ambulatory Visit: Payer: Self-pay

## 2023-08-24 ENCOUNTER — Other Ambulatory Visit (HOSPITAL_COMMUNITY): Payer: Self-pay

## 2023-08-24 DIAGNOSIS — D849 Immunodeficiency, unspecified: Secondary | ICD-10-CM | POA: Diagnosis not present

## 2023-08-24 DIAGNOSIS — Z944 Liver transplant status: Secondary | ICD-10-CM | POA: Diagnosis not present

## 2023-08-24 DIAGNOSIS — B259 Cytomegaloviral disease, unspecified: Secondary | ICD-10-CM | POA: Diagnosis not present

## 2023-08-24 MED ORDER — TACROLIMUS 1 MG PO CAPS
ORAL_CAPSULE | ORAL | 11 refills | Status: DC
  Filled 2023-08-24: qty 90, 30d supply, fill #0

## 2023-08-25 ENCOUNTER — Other Ambulatory Visit: Payer: Self-pay | Admitting: *Deleted

## 2023-08-25 ENCOUNTER — Other Ambulatory Visit (HOSPITAL_COMMUNITY): Payer: Self-pay

## 2023-08-25 ENCOUNTER — Inpatient Hospital Stay
Admission: RE | Admit: 2023-08-25 | Discharge: 2023-08-25 | Disposition: A | Discharge status: Home / Self Care | Payer: Self-pay | Source: Ambulatory Visit | Attending: Family Medicine | Admitting: Family Medicine

## 2023-08-25 ENCOUNTER — Other Ambulatory Visit: Payer: Self-pay | Admitting: Family Medicine

## 2023-08-25 DIAGNOSIS — Z1231 Encounter for screening mammogram for malignant neoplasm of breast: Secondary | ICD-10-CM

## 2023-08-25 NOTE — Telephone Encounter (Signed)
Per investigation of EMR:  Looks possibly like pt's PCP expedited this for her.  Would you mind confirming before responding back to the pt?  Thanks.

## 2023-08-25 NOTE — Telephone Encounter (Signed)
Dx Mammo/US Rt Breast @ [facility] 09/15/2021-birads 3 benign (recommended by radiologist 65mo dx b/l mammo & rt breast US)  Dx b/l mammo only performed @ [facility] 09/28/2022-birads 1 (recommendation from their radiologist was to return to routine screening mammos in 48yr)  Now pt desires to go to the [facility] for her imaging this yr.  Will contact the [facility] prior to notifying Dr. Edward Jolly what they will need as far as orders if anything.  Spoke w/ Asher Muir, @ [facility] and she reported that the pt will have to have a release signed for them so that they can get the results from imaging done at [facility] in 2023 prior to determining what kind of imaging that she will need.   Will notify pt via mychart msg.

## 2023-08-26 ENCOUNTER — Ambulatory Visit: Payer: 59

## 2023-08-26 DIAGNOSIS — R279 Unspecified lack of coordination: Secondary | ICD-10-CM | POA: Diagnosis not present

## 2023-08-26 DIAGNOSIS — R293 Abnormal posture: Secondary | ICD-10-CM | POA: Diagnosis not present

## 2023-08-26 DIAGNOSIS — M6281 Muscle weakness (generalized): Secondary | ICD-10-CM | POA: Diagnosis not present

## 2023-08-26 NOTE — Therapy (Signed)
OUTPATIENT PHYSICAL THERAPY FEMALE PELVIC TREATMENT   Patient Name: Jamie Coleman MRN: [id number] DOB:12-30-1970, 52 y.o., female Today's Date: 08/26/2023  END OF SESSION:  PT End of Session - 08/26/23 1022     Visit Number 5    Date for PT Re-Evaluation 01/10/24    Authorization Type [employer] employee    PT Start Time 1021    PT Stop Time 1059    PT Time Calculation (min) 38 min    Activity Tolerance Patient tolerated treatment well    Behavior During Therapy [facility] for tasks assessed/performed              Past Medical History:  Diagnosis Date   Allergy    allergic rhinitis   Anxiety    situational   COVID 05/2021   Difficult intubation    states 2012 ablation surgery, had diff intubation,glide scope used, not noted in EPIC anesthesia record   Elevated hemoglobin A1c 08/08/2015   Elevated liver function tests 08/08/2015   GERD (gastroesophageal reflux disease)    Hyperlipidemia    Low serum vitamin D 08/08/2015   Status post endometrial ablation    Tennis elbow    currently in PT-10/16   Past Surgical History:  Procedure Laterality Date   BIOPSY  02/09/2022   Procedure: BIOPSY;  Surgeon: Lemar Lofty., MD;  Location: [facility];  Service: Gastroenterology;;   CESAREAN SECTION     COLONOSCOPY WITH PROPOFOL N/A 02/09/2022   Procedure: COLONOSCOPY WITH PROPOFOL;  Surgeon: Lemar Lofty., MD;  Location: [facility];  Service: Gastroenterology;  Laterality: N/A;   ENDOMETRIAL ABLATION     8/12   ESOPHAGOGASTRODUODENOSCOPY (EGD) WITH PROPOFOL N/A 02/09/2022   Procedure: ESOPHAGOGASTRODUODENOSCOPY (EGD) WITH PROPOFOL;  Surgeon: Meridee Score Netty Starring., MD;  Location: [facility];  Service: Gastroenterology;  Laterality: N/A;   EXTREMITY WIRE/PIN REMOVAL Left 07/16/2016   Procedure: PIN REMOVAL OF LEFT WRIST X 2;  Surgeon: Dominica Severin, MD;  Location: [facility];  Service: Orthopedics;  Laterality: Left;   FOOT SURGERY  Left    LAPAROSCOPIC TUBAL LIGATION  06/09/2011   Procedure: LAPAROSCOPIC TUBAL LIGATION;  Surgeon: Melony Overly;  Location: [facility];  Service: Gynecology;  Laterality: N/A;   LIGAMENT REPAIR Left 05/08/2016   Procedure: LEFT WRIST SCAPHOLUNATE LIGAMENT RECONSTRUCTION WITH TENDON GRAFT PIN NEURECTOMY AND REPAIR;  Surgeon: Dominica Severin, MD;  Location:  S[facility]ERY [facility];  Service: Orthopedics;  Laterality: Left;   LIVER TRANSPLANT  09/2022   POLYPECTOMY  02/09/2022   Procedure: POLYPECTOMY;  Surgeon: Mansouraty, Netty Starring., MD;  Location: [facility];  Service: Gastroenterology;;   TONSILLECTOMY     as child   Patient Active Problem List   Diagnosis Date Noted   On prednisone therapy 04/29/2023   Abdominal weakness 04/29/2023   Multiple joint pain 02/15/2023   Menopause 02/15/2023   H/O liver transplant (HCC) 02/15/2023   Estrogen deficiency 09/24/2022   Acute on chronic alcoholic liver disease (HCC) 09/22/2022   Decompensation of cirrhosis of liver (HCC) 09/22/2022   History of alcoholic hepatitis 09/22/2022   Cirrhosis, alcoholic (HCC) 09/02/2022   Macrocytosis without anemia 09/02/2022   Acute liver failure without hepatic coma 09/01/2022   Bronchitis due to COVID-19 virus 05/05/2022   Elevated liver transaminase level 09/03/2019   Subclinical hypothyroidism 09/03/2019   Vitamin D deficiency 03/02/2018   Fatigue 03/02/2018   Pupil asymmetry 08/04/2016   Hemorrhoids 12/28/2014   Steroid-induced diabetes mellitus (HCC) 08/28/2014   Tremor 06/30/2013  Sebaceous cyst 02/15/2013   OTHER&UNSPECIFIED DISEASES THE ORAL SOFT TISSUES 07/25/2008   DERMATOFIBROMA, ARM 11/11/2007   NEOPLASM, SKIN, UNCERTAIN BEHAVIOR 02/11/2007   Hyperlipidemia 02/10/2007   Anxiety disorder 02/10/2007   Allergic rhinitis 02/10/2007   GERD 02/10/2007   IBS 02/10/2007    PCP: Judy Pimple, MD  REFERRING PROVIDER: Judy Pimple, MD  REFERRING DIAG: R19.8 (ICD-10-CM) - Abdominal  weakness  THERAPY DIAG:  Muscle weakness (generalized)  Abnormal posture  Unspecified lack of coordination  Rationale for Evaluation and Treatment: Rehabilitation  ONSET DATE: December 2023  SUBJECTIVE:                                                                                                                                                                                           SUBJECTIVE STATEMENT: Pt states that fluid from prednisone continues to get worse and bother her, but other than that she is feeling good and like exercises are going well.   PAIN:  Are you having pain? No   PRECAUTIONS: Other: Liver transplant December 2023, c-section  RED FLAGS: None   WEIGHT BEARING RESTRICTIONS: No  FALLS:  Has patient fallen in last 6 months? No  LIVING ENVIRONMENT: Lives with: lives with their family Lives in: House/apartment   OCCUPATION: nurse in GI  PLOF: Independent  PATIENT GOALS: to learn safe abdominal exercises   PERTINENT HISTORY:  Liver transplant, c-section   BOWEL MOVEMENT: Pain with bowel movement: No Type of bowel movement:Frequency 1-2x/day and Strain No Fully empty rectum: Yes: - Leakage: No Pads: No Fiber supplement: No  URINATION: Pain with urination: No Fully empty bladder: Yes: - Stream: Strong Urgency: No Frequency: every 3-4 hours  Leakage:  none Pads: No  INTERCOURSE: Pain with intercourse:  no pain   PREGNANCY: Vaginal deliveries no  Tearing No C-section deliveries 1 - twins - abdominal muscle tearing Currently pregnant No  PROLAPSE: None   OBJECTIVE:  Note: Objective measures were completed at Evaluation unless otherwise noted. 07/26/23: COGNITION: Overall cognitive status: Within functional limits for tasks assessed     SENSATION: Light touch: Appears intact Proprioception: Appears intact   FUNCTIONAL TESTS:  Squat: Rt weight shift with Lt trunk rotation Single leg stance:  Rt: unstable, shaking,  pelvic drop  Lt: unstable, shaking, pelvic drop Chin up test: significant difficulty, doming in lower abdominals ASLR: (-) bil, but shaking evident in bil LE Sit-up test: unable 0/3 Diastasis recti abdominus: 2 finger width separation with distortion inferior to umbilicus  GAIT: Comments: decreased trunk rotation  POSTURE: rounded shoulders, forward head, decreased lumbar lordosis, increased thoracic kyphosis, posterior pelvic tilt, and -  LUMBARAROM/PROM:  A/PROM A/PROM  Eval % available  Flexion 100  Extension 75  Right lateral flexion 50  Left lateral flexion 75  Right rotation 75, pain under Rt ribs  Left rotation 100   (Blank rows = not tested)   PALPATION:   General  Significant restriction throughout upper abdominals  TODAY'S TREATMENT:                                                                                                                              DATE:  08/26/23 Neuromuscular re-education: Bridge with April 01, 2024x 10 Modified dead bug 2 x 10 Exercises: Seated forward fold 10 breaths Seated piriformis stretch 2 minutes bil Clam shells 2 x 10 bil Side lying hip abduction 2 x 10 bil Side lying hip adduction 2 x 10 bil Lower trunk rotation 2 x 10  08/18/23 Manual: Abdominal scar tissue mobilization in supine Diaphragm release bil (Lt>Rt) Neuromuscular re-education: Supine pelvic tilts on dynadisc 2 x 10 Bridge with hip adduction, pelvic floor muscle, and transversus abdominus 2 x 10 Exercises: Straight leg raise 10x bil Standing table peanut ball roll outs 10x Hamstring foam rollers in supine 10x Lower trunk rotation 2 x 10  08/10/23 Manual: Abdominal scar tissue mobilization in supine Diaphragm release bil (Lt>Rt) Neuromuscular re-education: Hip adduction supine 10x with transversus abdominus  Bridge with hip adduction and transversus abdominus 2 x 10 Clam shells 10x bil      PATIENT EDUCATION:  Education details: See above Person  educated: Patient Education method: Explanation, Demonstration, Tactile cues, Verbal cues, and Handouts Education comprehension: verbalized understanding  HOME EXERCISE PROGRAM: [id number]  ASSESSMENT:  CLINICAL IMPRESSION: Pt continues to do well with strengthening program at home and progressions in session. She was able to progress hip strengthening today, but did have difficulty with hip adduction. She is able to breathe well through exercises and demonstrate good core coordination with increased difficulty. She will continue to benefit from skilled PT intervention in order to improve core strength and begin/progress functional strengthening program.   OBJECTIVE IMPAIRMENTS: decreased activity tolerance, decreased coordination, decreased endurance, decreased mobility, decreased strength, increased fascial restrictions, increased muscle spasms, impaired tone, postural dysfunction, and pain.   ACTIVITY LIMITATIONS: lifting, bending, sitting, squatting, and bed mobility  PARTICIPATION LIMITATIONS: community activity and occupation  PERSONAL FACTORS: 1 comorbidity: medical history  are also affecting patient's functional outcome.   REHAB POTENTIAL: Good  CLINICAL DECISION MAKING: Stable/uncomplicated  EVALUATION COMPLEXITY: Low   GOALS: Goals reviewed with patient? Yes  SHORT TERM GOALS: Target date: 08/23/23 - updated 08/26/23  Pt will be independent with HEP.   Baseline: Goal status: MET 08/26/23  2.  Pt will be able to perform active transversus abdominus contraction with appropriate pressure management and breathing.  Baseline:  Goal status: MET 08/26/23  3.  Pt will be independent with self scar tissue mobilization.  Baseline:  Goal status: MET 08/26/23    LONG TERM GOALS: Target  date: 01/10/2024 - updated 08/26/23  Pt will be independent with advanced HEP.   Baseline:  Goal status: IN PROGRESS  2.  Pt will be able to sit up in bed and perform bed mobility  without abdominal distortion.  Baseline:  Goal status: IN PROGRESS  3.  Pt will be able to score 2/3 on sit-up test without abdominal distortion. Baseline:  Goal status: IN PROGRESS   PLAN:  PT FREQUENCY: 1-2x/week  PT DURATION: 8 weeks  PLANNED INTERVENTIONS: 97110-Therapeutic exercises, 97530- Therapeutic activity, 97112- Neuromuscular re-education, 97535- Self Care, 86578- Manual therapy, Patient/Family education, Dry Needling, Biofeedback, and 97146- PT Re-evaluation  PLAN FOR NEXT SESSION: Plan to progress core strengthening; mobility exercises   Julio Alm, PT, DPT11/14/2410:56 AM

## 2023-08-27 ENCOUNTER — Other Ambulatory Visit: Payer: Self-pay

## 2023-08-27 ENCOUNTER — Other Ambulatory Visit (HOSPITAL_COMMUNITY): Payer: Self-pay

## 2023-08-30 ENCOUNTER — Ambulatory Visit: Payer: 59

## 2023-08-30 DIAGNOSIS — R279 Unspecified lack of coordination: Secondary | ICD-10-CM | POA: Diagnosis not present

## 2023-08-30 DIAGNOSIS — R293 Abnormal posture: Secondary | ICD-10-CM | POA: Diagnosis not present

## 2023-08-30 DIAGNOSIS — M6281 Muscle weakness (generalized): Secondary | ICD-10-CM | POA: Diagnosis not present

## 2023-08-30 NOTE — Therapy (Addendum)
OUTPATIENT PHYSICAL THERAPY FEMALE PELVIC TREATMENT   Patient Name: Jamie Coleman MRN: [id number] DOB:11-15-1970, 52 y.o., female Today's Date: 08/30/2023  END OF SESSION:  PT End of Session - 08/30/23 1405     Visit Number 6    Date for PT Re-Evaluation 01/10/24    Authorization Type [employer] employee    PT Start Time 1402    PT Stop Time 1440    PT Time Calculation (min) 38 min    Activity Tolerance Patient tolerated treatment well    Behavior During Therapy [facility] for tasks assessed/performed              Past Medical History:  Diagnosis Date   Allergy    allergic rhinitis   Anxiety    situational   COVID 05/2021   Difficult intubation    states 2012 ablation surgery, had diff intubation,glide scope used, not noted in EPIC anesthesia record   Elevated hemoglobin A1c 08/08/2015   Elevated liver function tests 08/08/2015   GERD (gastroesophageal reflux disease)    Hyperlipidemia    Low serum vitamin D 08/08/2015   Status post endometrial ablation    Tennis elbow    currently in PT-10/16   Past Surgical History:  Procedure Laterality Date   BIOPSY  02/09/2022   Procedure: BIOPSY;  Surgeon: Lemar Lofty., MD;  Location: [facility];  Service: Gastroenterology;;   CESAREAN SECTION     COLONOSCOPY WITH PROPOFOL N/A 02/09/2022   Procedure: COLONOSCOPY WITH PROPOFOL;  Surgeon: Lemar Lofty., MD;  Location: [facility];  Service: Gastroenterology;  Laterality: N/A;   ENDOMETRIAL ABLATION     8/12   ESOPHAGOGASTRODUODENOSCOPY (EGD) WITH PROPOFOL N/A 02/09/2022   Procedure: ESOPHAGOGASTRODUODENOSCOPY (EGD) WITH PROPOFOL;  Surgeon: Meridee Score Netty Starring., MD;  Location: [facility];  Service: Gastroenterology;  Laterality: N/A;   EXTREMITY WIRE/PIN REMOVAL Left 07/16/2016   Procedure: PIN REMOVAL OF LEFT WRIST X 2;  Surgeon: Dominica Severin, MD;  Location: [facility];  Service: Orthopedics;  Laterality: Left;   FOOT SURGERY  Left    LAPAROSCOPIC TUBAL LIGATION  06/09/2011   Procedure: LAPAROSCOPIC TUBAL LIGATION;  Surgeon: Melony Overly;  Location: [facility];  Service: Gynecology;  Laterality: N/A;   LIGAMENT REPAIR Left 05/08/2016   Procedure: LEFT WRIST SCAPHOLUNATE LIGAMENT RECONSTRUCTION WITH TENDON GRAFT PIN NEURECTOMY AND REPAIR;  Surgeon: Dominica Severin, MD;  Location:  [facility];  Service: Orthopedics;  Laterality: Left;   LIVER TRANSPLANT  09/2022   POLYPECTOMY  02/09/2022   Procedure: POLYPECTOMY;  Surgeon: Mansouraty, Netty Starring., MD;  Location: [facility];  Service: Gastroenterology;;   TONSILLECTOMY     as child   Patient Active Problem List   Diagnosis Date Noted   On prednisone therapy 04/29/2023   Abdominal weakness 04/29/2023   Multiple joint pain 02/15/2023   Menopause 02/15/2023   H/O liver transplant (HCC) 02/15/2023   Estrogen deficiency 09/24/2022   Acute on chronic alcoholic liver disease (HCC) 09/22/2022   Decompensation of cirrhosis of liver (HCC) 09/22/2022   History of alcoholic hepatitis 09/22/2022   Cirrhosis, alcoholic (HCC) 09/02/2022   Macrocytosis without anemia 09/02/2022   Acute liver failure without hepatic coma 09/01/2022   Bronchitis due to COVID-19 virus 05/05/2022   Elevated liver transaminase level 09/03/2019   Subclinical hypothyroidism 09/03/2019   Vitamin D deficiency 03/02/2018   Fatigue 03/02/2018   Pupil asymmetry 08/04/2016   Hemorrhoids 12/28/2014   Steroid-induced diabetes mellitus (HCC) 08/28/2014   Tremor 06/30/2013  Sebaceous cyst 02/15/2013   OTHER&UNSPECIFIED DISEASES THE ORAL SOFT TISSUES 07/25/2008   DERMATOFIBROMA, ARM 11/11/2007   NEOPLASM, SKIN, UNCERTAIN BEHAVIOR 02/11/2007   Hyperlipidemia 02/10/2007   Anxiety disorder 02/10/2007   Allergic rhinitis 02/10/2007   GERD 02/10/2007   IBS 02/10/2007    PCP: Judy Pimple, MD  REFERRING PROVIDER: Judy Pimple, MD  REFERRING DIAG: R19.8 (ICD-10-CM) - Abdominal  weakness  THERAPY DIAG:  Muscle weakness (generalized)  Abnormal posture  Unspecified lack of coordination  Rationale for Evaluation and Treatment: Rehabilitation  ONSET DATE: December 2023  SUBJECTIVE:                                                                                                                                                                                           SUBJECTIVE STATEMENT: Pt states that fluid from prednisone continues to get worse and bother her, but other than that she is feeling good and like exercises are going well.   PAIN:  Are you having pain? No   PRECAUTIONS: Other: Liver transplant December 2023, c-section  RED FLAGS: None   WEIGHT BEARING RESTRICTIONS: No  FALLS:  Has patient fallen in last 6 months? No  LIVING ENVIRONMENT: Lives with: lives with their family Lives in: House/apartment   OCCUPATION: nurse in GI  PLOF: Independent  PATIENT GOALS: to learn safe abdominal exercises   PERTINENT HISTORY:  Liver transplant, c-section   BOWEL MOVEMENT: Pain with bowel movement: No Type of bowel movement:Frequency 1-2x/day and Strain No Fully empty rectum: Yes: - Leakage: No Pads: No Fiber supplement: No  URINATION: Pain with urination: No Fully empty bladder: Yes: - Stream: Strong Urgency: No Frequency: every 3-4 hours  Leakage:  none Pads: No  INTERCOURSE: Pain with intercourse:  no pain   PREGNANCY: Vaginal deliveries no  Tearing No C-section deliveries 1 - twins - abdominal muscle tearing Currently pregnant No  PROLAPSE: None   OBJECTIVE:  Note: Objective measures were completed at Evaluation unless otherwise noted. 07/26/23: COGNITION: Overall cognitive status: Within functional limits for tasks assessed     SENSATION: Light touch: Appears intact Proprioception: Appears intact   FUNCTIONAL TESTS:  Squat: Rt weight shift with Lt trunk rotation Single leg stance:  Rt: unstable, shaking,  pelvic drop  Lt: unstable, shaking, pelvic drop Chin up test: significant difficulty, doming in lower abdominals ASLR: (-) bil, but shaking evident in bil LE Sit-up test: unable 0/3 Diastasis recti abdominus: 2 finger width separation with distortion inferior to umbilicus  GAIT: Comments: decreased trunk rotation  POSTURE: rounded shoulders, forward head, decreased lumbar lordosis, increased thoracic kyphosis, posterior pelvic tilt, and -  LUMBARAROM/PROM:  A/PROM A/PROM  Eval % available  Flexion 100  Extension 75  Right lateral flexion 50  Left lateral flexion 75  Right rotation 75, pain under Rt ribs  Left rotation 100   (Blank rows = not tested)   PALPATION:   General  Significant restriction throughout upper abdominals  TODAY'S TREATMENT:                                                                                                                              DATE:  08/30/23 Manual: Abdominal scar tissue mobilization in supine Diaphragm release bil (Lt>Rt) Exercises: Puppy dog pose 10 breaths Cobra 10 breaths Seated thoracic extension 10x  08/26/23 Neuromuscular re-education: Bridge with 03/27/24x 10 Modified dead bug 2 x 10 Exercises: Seated forward fold 10 breaths Seated piriformis stretch 2 minutes bil Clam shells 2 x 10 bil Side lying hip abduction 2 x 10 bil Side lying hip adduction 2 x 10 bil Lower trunk rotation 2 x 10  08/18/23 Manual: Abdominal scar tissue mobilization in supine Diaphragm release bil (Lt>Rt) Neuromuscular re-education: Supine pelvic tilts on dynadisc 2 x 10 Bridge with hip adduction, pelvic floor muscle, and transversus abdominus 2 x 10 Exercises: Straight leg raise 10x bil Standing table peanut ball roll outs 10x Hamstring foam rollers in supine 10x Lower trunk rotation 2 x 10     PATIENT EDUCATION:  Education details: See above Person educated: Patient Education method: Explanation, Demonstration, Tactile cues,  Verbal cues, and Handouts Education comprehension: verbalized understanding  HOME EXERCISE PROGRAM: [id number]  ASSESSMENT:  CLINICAL IMPRESSION: Pt doing very well overall. She has been able to be more active, but is still having difficulty with swelling. She did well with mobility exercises, but became window with puppy dog pose likely due to positioning with abdominal swelling. Abdominal scar tissue mobilization performed with improvements, but still restriction present that responded well. Pt said that her low back was sore coming in, but after manual techniques to abdomen and stretches back felt better. She will continue to benefit from skilled PT intervention in order to improve core strength and begin/progress functional strengthening program.   OBJECTIVE IMPAIRMENTS: decreased activity tolerance, decreased coordination, decreased endurance, decreased mobility, decreased strength, increased fascial restrictions, increased muscle spasms, impaired tone, postural dysfunction, and pain.   ACTIVITY LIMITATIONS: lifting, bending, sitting, squatting, and bed mobility  PARTICIPATION LIMITATIONS: community activity and occupation  PERSONAL FACTORS: 1 comorbidity: medical history  are also affecting patient's functional outcome.   REHAB POTENTIAL: Good  CLINICAL DECISION MAKING: Stable/uncomplicated  EVALUATION COMPLEXITY: Low   GOALS: Goals reviewed with patient? Yes  SHORT TERM GOALS: Target date: 08/23/23 - updated 08/26/23  Pt will be independent with HEP.   Baseline: Goal status: MET 08/26/23  2.  Pt will be able to perform active transversus abdominus contraction with appropriate pressure management and breathing.  Baseline:  Goal status: MET 08/26/23  3.  Pt will be independent with  self scar tissue mobilization.  Baseline:  Goal status: MET 08/26/23    LONG TERM GOALS: Target date: 01/10/2024 - updated 08/26/23  Pt will be independent with advanced HEP.   Baseline:   Goal status: IN PROGRESS  2.  Pt will be able to sit up in bed and perform bed mobility without abdominal distortion.  Baseline:  Goal status: IN PROGRESS  3.  Pt will be able to score 2/3 on sit-up test without abdominal distortion. Baseline:  Goal status: IN PROGRESS   PLAN:  PT FREQUENCY: 1-2x/week  PT DURATION: 8 weeks  PLANNED INTERVENTIONS: 97110-Therapeutic exercises, 97530- Therapeutic activity, [id number]- Neuromuscular re-education, 97535- Self Care, [id number]- Manual therapy, Patient/Family education, Dry Needling, Biofeedback, and [id number]- PT Re-evaluation  PLAN FOR NEXT SESSION: Plan to progress core strengthening; mobility exercises; plan to treat low back tightness.    Julio Alm, PT, DPT11/18/242:43 PM

## 2023-08-31 DIAGNOSIS — B259 Cytomegaloviral disease, unspecified: Secondary | ICD-10-CM | POA: Diagnosis not present

## 2023-08-31 DIAGNOSIS — T8641 Liver transplant rejection: Secondary | ICD-10-CM | POA: Diagnosis not present

## 2023-08-31 DIAGNOSIS — D84821 Immunodeficiency due to drugs: Secondary | ICD-10-CM | POA: Diagnosis not present

## 2023-09-01 ENCOUNTER — Other Ambulatory Visit (HOSPITAL_COMMUNITY): Payer: Self-pay

## 2023-09-01 MED ORDER — PREDNISONE 20 MG PO TABS
40.0000 mg | ORAL_TABLET | Freq: Every day | ORAL | 11 refills | Status: DC
  Filled 2023-09-01 – 2023-09-07 (×2): qty 60, 30d supply, fill #0

## 2023-09-02 ENCOUNTER — Other Ambulatory Visit (HOSPITAL_COMMUNITY): Payer: Self-pay

## 2023-09-02 MED ORDER — VALGANCICLOVIR HCL 450 MG PO TABS
ORAL_TABLET | ORAL | 11 refills | Status: DC
  Filled 2023-09-02 – 2023-09-22 (×2): qty 60, 30d supply, fill #0
  Filled 2023-11-10: qty 60, 30d supply, fill #1
  Filled 2023-12-05: qty 60, 30d supply, fill #2

## 2023-09-06 ENCOUNTER — Ambulatory Visit: Payer: 59

## 2023-09-06 DIAGNOSIS — Z944 Liver transplant status: Secondary | ICD-10-CM | POA: Diagnosis not present

## 2023-09-06 DIAGNOSIS — R279 Unspecified lack of coordination: Secondary | ICD-10-CM | POA: Diagnosis not present

## 2023-09-06 DIAGNOSIS — B259 Cytomegaloviral disease, unspecified: Secondary | ICD-10-CM | POA: Diagnosis not present

## 2023-09-06 DIAGNOSIS — M6281 Muscle weakness (generalized): Secondary | ICD-10-CM

## 2023-09-06 DIAGNOSIS — D849 Immunodeficiency, unspecified: Secondary | ICD-10-CM | POA: Diagnosis not present

## 2023-09-06 DIAGNOSIS — R293 Abnormal posture: Secondary | ICD-10-CM

## 2023-09-06 NOTE — Patient Instructions (Signed)

## 2023-09-06 NOTE — Therapy (Signed)
OUTPATIENT PHYSICAL THERAPY FEMALE PELVIC TREATMENT   Patient Name: Jamie Coleman MRN: [id number] DOB:06-22-1971, 51 y.o., female Today's Date: 09/06/2023  END OF SESSION:  PT End of Session - 09/06/23 0842     Visit Number 7    Date for PT Re-Evaluation 01/10/24    Authorization Type [employer] employee    PT Start Time 0845    PT Stop Time 0925    PT Time Calculation (min) 40 min    Activity Tolerance Patient tolerated treatment well    Behavior During Therapy [facility] for tasks assessed/performed              Past Medical History:  Diagnosis Date   Allergy    allergic rhinitis   Anxiety    situational   COVID 05/2021   Difficult intubation    states 2012 ablation surgery, had diff intubation,glide scope used, not noted in EPIC anesthesia record   Elevated hemoglobin A1c 08/08/2015   Elevated liver function tests 08/08/2015   GERD (gastroesophageal reflux disease)    Hyperlipidemia    Low serum vitamin D 08/08/2015   Status post endometrial ablation    Tennis elbow    currently in PT-10/16   Past Surgical History:  Procedure Laterality Date   BIOPSY  02/09/2022   Procedure: BIOPSY;  Surgeon: Lemar Lofty., MD;  Location: [facility];  Service: Gastroenterology;;   CESAREAN SECTION     COLONOSCOPY WITH PROPOFOL N/A 02/09/2022   Procedure: COLONOSCOPY WITH PROPOFOL;  Surgeon: Lemar Lofty., MD;  Location: [facility];  Service: Gastroenterology;  Laterality: N/A;   ENDOMETRIAL ABLATION     8/12   ESOPHAGOGASTRODUODENOSCOPY (EGD) WITH PROPOFOL N/A 02/09/2022   Procedure: ESOPHAGOGASTRODUODENOSCOPY (EGD) WITH PROPOFOL;  Surgeon: Meridee Score Netty Starring., MD;  Location: [facility];  Service: Gastroenterology;  Laterality: N/A;   EXTREMITY WIRE/PIN REMOVAL Left 07/16/2016   Procedure: PIN REMOVAL OF LEFT WRIST X 2;  Surgeon: Dominica Severin, MD;  Location: [facility];  Service: Orthopedics;  Laterality: Left;   FOOT SURGERY  Left    LAPAROSCOPIC TUBAL LIGATION  06/09/2011   Procedure: LAPAROSCOPIC TUBAL LIGATION;  Surgeon: Melony Overly;  Location: [facility];  Service: Gynecology;  Laterality: N/A;   LIGAMENT REPAIR Left 05/08/2016   Procedure: LEFT WRIST SCAPHOLUNATE LIGAMENT RECONSTRUCTION WITH TENDON GRAFT PIN NEURECTOMY AND REPAIR;  Surgeon: Dominica Severin, MD;  Location: [facility];  Service: Orthopedics;  Laterality: Left;   LIVER TRANSPLANT  09/2022   POLYPECTOMY  02/09/2022   Procedure: POLYPECTOMY;  Surgeon: Mansouraty, Netty Starring., MD;  Location: [facility];  Service: Gastroenterology;;   TONSILLECTOMY     as child   Patient Active Problem List   Diagnosis Date Noted   On prednisone therapy 04/29/2023   Abdominal weakness 04/29/2023   Multiple joint pain 02/15/2023   Menopause 02/15/2023   H/O liver transplant (HCC) 02/15/2023   Estrogen deficiency 09/24/2022   Acute on chronic alcoholic liver disease (HCC) 09/22/2022   Decompensation of cirrhosis of liver (HCC) 09/22/2022   History of alcoholic hepatitis 09/22/2022   Cirrhosis, alcoholic (HCC) 09/02/2022   Macrocytosis without anemia 09/02/2022   Acute liver failure without hepatic coma 09/01/2022   Bronchitis due to COVID-19 virus 05/05/2022   Elevated liver transaminase level 09/03/2019   Subclinical hypothyroidism 09/03/2019   Vitamin D deficiency 03/02/2018   Fatigue 03/02/2018   Pupil asymmetry 08/04/2016   Hemorrhoids 12/28/2014   Steroid-induced diabetes mellitus (HCC) 08/28/2014   Tremor 06/30/2013  Sebaceous cyst 02/15/2013   OTHER&UNSPECIFIED DISEASES THE ORAL SOFT TISSUES 07/25/2008   DERMATOFIBROMA, ARM 11/11/2007   NEOPLASM, SKIN, UNCERTAIN BEHAVIOR 02/11/2007   Hyperlipidemia 02/10/2007   Anxiety disorder 02/10/2007   Allergic rhinitis 02/10/2007   GERD 02/10/2007   IBS 02/10/2007    PCP: Judy Pimple, MD  REFERRING PROVIDER: Judy Pimple, MD  REFERRING DIAG: R19.8 (ICD-10-CM) - Abdominal  weakness  THERAPY DIAG:  Muscle weakness (generalized)  Abnormal posture  Unspecified lack of coordination  Rationale for Evaluation and Treatment: Rehabilitation  ONSET DATE: December 2023  SUBJECTIVE:                                                                                                                                                                                           SUBJECTIVE STATEMENT: Pt states that she feels like abdominal scar tissue is doing much better. Working on scar tissue in our sessions has allowed her to do more without pain or fear of movement.   PAIN:  PAIN:  Are you having pain? Yes NPRS scale: 0/10, after walking for a minute pain will increase to 7/10 Pain location: low back Pain orientation: Bilateral  PAIN TYPE: radiating and aching/sharp Pain description: intermittent  Aggravating factors: walking, bending over getting ready to sit Relieving factors: stretching, flexion to stretch out    PRECAUTIONS: Other: Liver transplant December 2023, c-section  RED FLAGS: None   WEIGHT BEARING RESTRICTIONS: No  FALLS:  Has patient fallen in last 6 months? No  LIVING ENVIRONMENT: Lives with: lives with their family Lives in: House/apartment   OCCUPATION: nurse in GI  PLOF: Independent  PATIENT GOALS: to learn safe abdominal exercises   PERTINENT HISTORY:  Liver transplant, c-section   BOWEL MOVEMENT: Pain with bowel movement: No Type of bowel movement:Frequency 1-2x/day and Strain No Fully empty rectum: Yes: - Leakage: No Pads: No Fiber supplement: No  URINATION: Pain with urination: No Fully empty bladder: Yes: - Stream: Strong Urgency: No Frequency: every 3-4 hours  Leakage:  none Pads: No  INTERCOURSE: Pain with intercourse:  no pain   PREGNANCY: Vaginal deliveries no  Tearing No C-section deliveries 1 - twins - abdominal muscle tearing Currently pregnant No  PROLAPSE: None   OBJECTIVE:  Note:  Objective measures were completed at Evaluation unless otherwise noted. 07/26/23: COGNITION: Overall cognitive status: Within functional limits for tasks assessed     SENSATION: Light touch: Appears intact Proprioception: Appears intact   FUNCTIONAL TESTS:  Squat: Rt weight shift with Lt trunk rotation Single leg stance:  Rt: unstable, shaking, pelvic drop  Lt: unstable, shaking, pelvic drop Chin up test: significant  difficulty, doming in lower abdominals ASLR: (-) bil, but shaking evident in bil LE Sit-up test: unable 0/3 Diastasis recti abdominus: 2 finger width separation with distortion inferior to umbilicus  GAIT: Comments: decreased trunk rotation  POSTURE: rounded shoulders, forward head, decreased lumbar lordosis, increased thoracic kyphosis, posterior pelvic tilt, and -  LUMBARAROM/PROM:  A/PROM A/PROM  Eval % available  Flexion 100  Extension 75  Right lateral flexion 50  Left lateral flexion 75  Right rotation 75, pain under Rt ribs  Left rotation 100   (Blank rows = not tested)   PALPATION:   General  Significant restriction throughout upper abdominals  TODAY'S TREATMENT:                                                                                                                              DATE:  09/06/23 Manual: Trigger Point Dry-Needling  Treatment instructions: Expect mild to moderate muscle soreness. S/S of pneumothorax if dry needled over a lung field, and to seek immediate medical attention should they occur. Patient verbalized understanding of these instructions and education.  Patient Consent Given: Yes Education handout provided: Yes Muscles treated: Bil glutes  Electrical stimulation performed: No Parameters: N/A Treatment response/outcome: twitch response/release Soft tissue mobilization to lumbar paraspinals and glutes  Abdominal scar tissue mobilization in supine Diaphragm release bil (Lt>Rt)   08/30/23 Manual: Abdominal scar  tissue mobilization in supine Diaphragm release bil (Lt>Rt) Exercises: Puppy dog pose 10 breaths Cobra 10 breaths Seated thoracic extension 10x  08/26/23 Neuromuscular re-education: Bridge with 03-14-2024x 10 Modified dead bug 2 x 10 Exercises: Seated forward fold 10 breaths Seated piriformis stretch 2 minutes bil Clam shells 2 x 10 bil Side lying hip abduction 2 x 10 bil Side lying hip adduction 2 x 10 bil Lower trunk rotation 2 x 10    PATIENT EDUCATION:  Education details: See above Person educated: Patient Education method: Programmer, multimedia, Demonstration, Tactile cues, Verbal cues, and Handouts Education comprehension: verbalized understanding  HOME EXERCISE PROGRAM: [id number]  ASSESSMENT:  CLINICAL IMPRESSION: Pt feels like the abdominal scar tissue mobilization is helping her to be more active and decrease fear of movement. She is having more low back pain and feels like it's due to water weight from prednisone. Due to these things, emphasis of today's treatment was placed on manual techniques. Dry needling tried in bil glutes with notable decrease in soft tissue restriction. She tolerated soft tissue mobilization very well to this area and abdominals/scar tissue as well. She was encouraged to work on stretches and exercises throughout the day to help diminish soreness. She will continue to benefit from skilled PT intervention in order to improve core strength and begin/progress functional strengthening program.   OBJECTIVE IMPAIRMENTS: decreased activity tolerance, decreased coordination, decreased endurance, decreased mobility, decreased strength, increased fascial restrictions, increased muscle spasms, impaired tone, postural dysfunction, and pain.   ACTIVITY LIMITATIONS: lifting, bending, sitting, squatting, and bed mobility  PARTICIPATION LIMITATIONS: community  activity and occupation  PERSONAL FACTORS: 1 comorbidity: medical history  are also affecting patient's  functional outcome.   REHAB POTENTIAL: Good  CLINICAL DECISION MAKING: Stable/uncomplicated  EVALUATION COMPLEXITY: Low   GOALS: Goals reviewed with patient? Yes  SHORT TERM GOALS: Target date: 08/23/23 - updated 08/26/23  Pt will be independent with HEP.   Baseline: Goal status: MET 08/26/23  2.  Pt will be able to perform active transversus abdominus contraction with appropriate pressure management and breathing.  Baseline:  Goal status: MET 08/26/23  3.  Pt will be independent with self scar tissue mobilization.  Baseline:  Goal status: MET 08/26/23    LONG TERM GOALS: Target date: 01/10/2024 - updated 08/26/23  Pt will be independent with advanced HEP.   Baseline:  Goal status: IN PROGRESS  2.  Pt will be able to sit up in bed and perform bed mobility without abdominal distortion.  Baseline:  Goal status: IN PROGRESS  3.  Pt will be able to score 2/3 on sit-up test without abdominal distortion. Baseline:  Goal status: IN PROGRESS   PLAN:  PT FREQUENCY: 1-2x/week  PT DURATION: 8 weeks  PLANNED INTERVENTIONS: 97110-Therapeutic exercises, 97530- Therapeutic activity, [id number]- Neuromuscular re-education, 97535- Self Care, 64332- Manual therapy, Patient/Family education, Dry Needling, Biofeedback, and 97146- PT Re-evaluation  PLAN FOR NEXT SESSION: Plan to progress core strengthening; mobility exercises; plan to treat low back tightness.    Julio Alm, PT, DPT11/25/249:30 AM

## 2023-09-07 ENCOUNTER — Other Ambulatory Visit (HOSPITAL_COMMUNITY): Payer: Self-pay

## 2023-09-08 ENCOUNTER — Other Ambulatory Visit (HOSPITAL_COMMUNITY): Payer: Self-pay

## 2023-09-08 MED ORDER — PREDNISONE 5 MG PO TABS
ORAL_TABLET | ORAL | 11 refills | Status: DC
  Filled 2023-09-08: qty 90, 30d supply, fill #0

## 2023-09-08 MED ORDER — PREDNISONE 20 MG PO TABS
ORAL_TABLET | ORAL | 11 refills | Status: DC
  Filled 2023-09-08 (×2): qty 30, 30d supply, fill #0

## 2023-09-10 ENCOUNTER — Other Ambulatory Visit: Payer: Self-pay

## 2023-09-10 ENCOUNTER — Other Ambulatory Visit (HOSPITAL_COMMUNITY): Payer: Self-pay

## 2023-09-11 ENCOUNTER — Other Ambulatory Visit (HOSPITAL_COMMUNITY): Payer: Self-pay

## 2023-09-13 ENCOUNTER — Other Ambulatory Visit (HOSPITAL_COMMUNITY): Payer: Self-pay

## 2023-09-14 ENCOUNTER — Ambulatory Visit: Payer: 59 | Attending: Family Medicine

## 2023-09-14 DIAGNOSIS — R279 Unspecified lack of coordination: Secondary | ICD-10-CM | POA: Diagnosis not present

## 2023-09-14 DIAGNOSIS — M6281 Muscle weakness (generalized): Secondary | ICD-10-CM | POA: Insufficient documentation

## 2023-09-14 DIAGNOSIS — R293 Abnormal posture: Secondary | ICD-10-CM | POA: Diagnosis not present

## 2023-09-14 DIAGNOSIS — M62838 Other muscle spasm: Secondary | ICD-10-CM | POA: Diagnosis not present

## 2023-09-14 NOTE — Therapy (Signed)
OUTPATIENT PHYSICAL THERAPY FEMALE PELVIC TREATMENT   Patient Name: Jamie Coleman MRN: [id number] DOB:1971/06/23, 52 y.o., female Today's Date: 09/14/2023  END OF SESSION:  PT End of Session - 09/14/23 1617     Visit Number 8    Date for PT Re-Evaluation 01/10/24    Authorization Type [employer] employee    PT Start Time 1615    PT Stop Time 1655    PT Time Calculation (min) 40 min    Activity Tolerance Patient tolerated treatment well    Behavior During Therapy [facility] for tasks assessed/performed              Past Medical History:  Diagnosis Date   Allergy    allergic rhinitis   Anxiety    situational   COVID 05/2021   Difficult intubation    states 2012 ablation surgery, had diff intubation,glide scope used, not noted in EPIC anesthesia record   Elevated hemoglobin A1c 08/08/2015   Elevated liver function tests 08/08/2015   GERD (gastroesophageal reflux disease)    Hyperlipidemia    Low serum vitamin D 08/08/2015   Status post endometrial ablation    Tennis elbow    currently in PT-10/16   Past Surgical History:  Procedure Laterality Date   BIOPSY  02/09/2022   Procedure: BIOPSY;  Surgeon: Lemar Lofty., MD;  Location: [facility];  Service: Gastroenterology;;   CESAREAN SECTION     COLONOSCOPY WITH PROPOFOL N/A 02/09/2022   Procedure: COLONOSCOPY WITH PROPOFOL;  Surgeon: Lemar Lofty., MD;  Location: [facility];  Service: Gastroenterology;  Laterality: N/A;   ENDOMETRIAL ABLATION     8/12   ESOPHAGOGASTRODUODENOSCOPY (EGD) WITH PROPOFOL N/A 02/09/2022   Procedure: ESOPHAGOGASTRODUODENOSCOPY (EGD) WITH PROPOFOL;  Surgeon: Meridee Score Netty Starring., MD;  Location: [facility];  Service: Gastroenterology;  Laterality: N/A;   EXTREMITY WIRE/PIN REMOVAL Left 07/16/2016   Procedure: PIN REMOVAL OF LEFT WRIST X 2;  Surgeon: Dominica Severin, MD;  Location: [facility];  Service: Orthopedics;  Laterality: Left;   FOOT SURGERY Left     LAPAROSCOPIC TUBAL LIGATION  06/09/2011   Procedure: LAPAROSCOPIC TUBAL LIGATION;  Surgeon: Melony Overly;  Location: [facility];  Service: Gynecology;  Laterality: N/A;   LIGAMENT REPAIR Left 05/08/2016   Procedure: LEFT WRIST SCAPHOLUNATE LIGAMENT RECONSTRUCTION WITH TENDON GRAFT PIN NEURECTOMY AND REPAIR;  Surgeon: Dominica Severin, MD;  Location: [facility];  Service: Orthopedics;  Laterality: Left;   LIVER TRANSPLANT  09/2022   POLYPECTOMY  02/09/2022   Procedure: POLYPECTOMY;  Surgeon: Mansouraty, Netty Starring., MD;  Location: [facility];  Service: Gastroenterology;;   TONSILLECTOMY     as child   Patient Active Problem List   Diagnosis Date Noted   On prednisone therapy 04/29/2023   Abdominal weakness 04/29/2023   Multiple joint pain 02/15/2023   Menopause 02/15/2023   H/O liver transplant (HCC) 02/15/2023   Estrogen deficiency 09/24/2022   Acute on chronic alcoholic liver disease (HCC) 09/22/2022   Decompensation of cirrhosis of liver (HCC) 09/22/2022   History of alcoholic hepatitis 09/22/2022   Cirrhosis, alcoholic (HCC) 09/02/2022   Macrocytosis without anemia 09/02/2022   Acute liver failure without hepatic coma 09/01/2022   Bronchitis due to COVID-19 virus 05/05/2022   Elevated liver transaminase level 09/03/2019   Subclinical hypothyroidism 09/03/2019   Vitamin D deficiency 03/02/2018   Fatigue 03/02/2018   Pupil asymmetry 08/04/2016   Hemorrhoids 12/28/2014   Steroid-induced diabetes mellitus (HCC) 08/28/2014   Tremor 06/30/2013  Sebaceous cyst 02/15/2013   OTHER&UNSPECIFIED DISEASES THE ORAL SOFT TISSUES 07/25/2008   DERMATOFIBROMA, ARM 11/11/2007   NEOPLASM, SKIN, UNCERTAIN BEHAVIOR 02/11/2007   Hyperlipidemia 02/10/2007   Anxiety disorder 02/10/2007   Allergic rhinitis 02/10/2007   GERD 02/10/2007   IBS 02/10/2007    PCP: Judy Pimple, MD  REFERRING PROVIDER: Judy Pimple, MD  REFERRING DIAG: R19.8 (ICD-10-CM) - Abdominal  weakness  THERAPY DIAG:  Muscle weakness (generalized)  Abnormal posture  Unspecified lack of coordination  Rationale for Evaluation and Treatment: Rehabilitation  ONSET DATE: December 2023  SUBJECTIVE:                                                                                                                                                                                           SUBJECTIVE STATEMENT: Pt states that her low back is still bothering her. She felt good after last session for several days, but then her back started bothering her more after Thanksgiving.   PAIN:  PAIN:  Are you having pain? Yes NPRS scale: 0/10, but when her back catches she goes up to a 10/10 Pain location: low back Pain orientation: Bilateral  PAIN TYPE: radiating and aching/sharp Pain description: intermittent  Aggravating factors: walking, bending over getting ready to sit Relieving factors: stretching, flexion to stretch out    PRECAUTIONS: Other: Liver transplant December 2023, c-section  RED FLAGS: None   WEIGHT BEARING RESTRICTIONS: No  FALLS:  Has patient fallen in last 6 months? No  LIVING ENVIRONMENT: Lives with: lives with their family Lives in: House/apartment   OCCUPATION: nurse in GI  PLOF: Independent  PATIENT GOALS: to learn safe abdominal exercises   PERTINENT HISTORY:  Liver transplant, c-section   BOWEL MOVEMENT: Pain with bowel movement: No Type of bowel movement:Frequency 1-2x/day and Strain No Fully empty rectum: Yes: - Leakage: No Pads: No Fiber supplement: No  URINATION: Pain with urination: No Fully empty bladder: Yes: - Stream: Strong Urgency: No Frequency: every 3-4 hours  Leakage:  none Pads: No  INTERCOURSE: Pain with intercourse:  no pain   PREGNANCY: Vaginal deliveries no  Tearing No C-section deliveries 1 - twins - abdominal muscle tearing Currently pregnant No  PROLAPSE: None   OBJECTIVE:  Note: Objective  measures were completed at Evaluation unless otherwise noted. 07/26/23: COGNITION: Overall cognitive status: Within functional limits for tasks assessed     SENSATION: Light touch: Appears intact Proprioception: Appears intact   FUNCTIONAL TESTS:  Squat: Rt weight shift with Lt trunk rotation Single leg stance:  Rt: unstable, shaking, pelvic drop  Lt: unstable, shaking, pelvic drop Chin up test: significant difficulty, doming  in lower abdominals ASLR: (-) bil, but shaking evident in bil LE Sit-up test: unable 0/3 Diastasis recti abdominus: 2 finger width separation with distortion inferior to umbilicus  GAIT: Comments: decreased trunk rotation  POSTURE: rounded shoulders, forward head, decreased lumbar lordosis, increased thoracic kyphosis, posterior pelvic tilt, and -  LUMBARAROM/PROM:  A/PROM A/PROM  Eval % available  Flexion 100  Extension 75  Right lateral flexion 50  Left lateral flexion 75  Right rotation 75, pain under Rt ribs  Left rotation 100   (Blank rows = not tested)   PALPATION:   General  Significant restriction throughout upper abdominals  TODAY'S TREATMENT:                                                                                                                              DATE:  09/14/23 Manual: Trigger Point Dry-Needling  Treatment instructions: Expect mild to moderate muscle soreness. S/S of pneumothorax if dry needled over a lung field, and to seek immediate medical attention should they occur. Patient verbalized understanding of these instructions and education.  Patient Consent Given: Yes Education handout provided: Yes Muscles treated: Bil glutes  Electrical stimulation performed: No Parameters: N/A Treatment response/outcome: twitch response/release Soft tissue mobilization to lumbar paraspinals and glutes Exercises: Bridges with hip adduction 2 x 10 Bridge with clam shell 2 x 5  09/06/23 Manual: Trigger Point Dry-Needling   Treatment instructions: Expect mild to moderate muscle soreness. S/S of pneumothorax if dry needled over a lung field, and to seek immediate medical attention should they occur. Patient verbalized understanding of these instructions and education.  Patient Consent Given: Yes Education handout provided: Yes Muscles treated: Bil glutes  Electrical stimulation performed: No Parameters: N/A Treatment response/outcome: twitch response/release Soft tissue mobilization to lumbar paraspinals and glutes  Abdominal scar tissue mobilization in supine Diaphragm release bil (Lt>Rt)   08/30/23 Manual: Abdominal scar tissue mobilization in supine Diaphragm release bil (Lt>Rt) Exercises: Puppy dog pose 10 breaths Cobra 10 breaths Seated thoracic extension 10x   PATIENT EDUCATION:  Education details: See above Person educated: Patient Education method: Programmer, multimedia, Demonstration, Actor cues, Verbal cues, and Handouts Education comprehension: verbalized understanding  HOME EXERCISE PROGRAM: [id number]  ASSESSMENT:  CLINICAL IMPRESSION: Due to persistent pain, but relief after last treatment session, we continued dry needling and manual techniques to low back. She tolerated very well and demonstrated improvement in trigger points in bil glutes. She did well with targeted gluteal strengthening, but did have some low back discomfort when coming back down from bridge after clam shell. Once she was was purposefully contracting core during this exercise, pain decreased. She was encouraged to work on exercises when she is experiencing low back pain. She will continue to benefit from skilled PT intervention in order to improve core strength and begin/progress functional strengthening program.   OBJECTIVE IMPAIRMENTS: decreased activity tolerance, decreased coordination, decreased endurance, decreased mobility, decreased strength, increased fascial restrictions, increased muscle spasms, impaired tone,  postural dysfunction, and pain.   ACTIVITY LIMITATIONS: lifting, bending, sitting, squatting, and bed mobility  PARTICIPATION LIMITATIONS: community activity and occupation  PERSONAL FACTORS: 1 comorbidity: medical history  are also affecting patient's functional outcome.   REHAB POTENTIAL: Good  CLINICAL DECISION MAKING: Stable/uncomplicated  EVALUATION COMPLEXITY: Low   GOALS: Goals reviewed with patient? Yes  SHORT TERM GOALS: Target date: 08/23/23 - updated 08/26/23  Pt will be independent with HEP.   Baseline: Goal status: MET 08/26/23  2.  Pt will be able to perform active transversus abdominus contraction with appropriate pressure management and breathing.  Baseline:  Goal status: MET 08/26/23  3.  Pt will be independent with self scar tissue mobilization.  Baseline:  Goal status: MET 08/26/23    LONG TERM GOALS: Target date: 01/10/2024 - updated 08/26/23  Pt will be independent with advanced HEP.   Baseline:  Goal status: IN PROGRESS  2.  Pt will be able to sit up in bed and perform bed mobility without abdominal distortion.  Baseline:  Goal status: IN PROGRESS  3.  Pt will be able to score 2/3 on sit-up test without abdominal distortion. Baseline:  Goal status: IN PROGRESS   PLAN:  PT FREQUENCY: 1-2x/week  PT DURATION: 8 weeks  PLANNED INTERVENTIONS: 97110-Therapeutic exercises, 97530- Therapeutic activity, [id number]- Neuromuscular re-education, 97535- Self Care, [id number]- Manual therapy, Patient/Family education, Dry Needling, Biofeedback, and 97146- PT Re-evaluation  PLAN FOR NEXT SESSION: Plan to progress core strengthening; mobility exercises; plan to treat low back tightness.    Julio Alm, PT, DPT12/03/244:59 PM

## 2023-09-15 DIAGNOSIS — T8641 Liver transplant rejection: Secondary | ICD-10-CM | POA: Diagnosis not present

## 2023-09-15 DIAGNOSIS — F1010 Alcohol abuse, uncomplicated: Secondary | ICD-10-CM | POA: Diagnosis not present

## 2023-09-15 DIAGNOSIS — B259 Cytomegaloviral disease, unspecified: Secondary | ICD-10-CM | POA: Diagnosis not present

## 2023-09-15 DIAGNOSIS — D849 Immunodeficiency, unspecified: Secondary | ICD-10-CM | POA: Diagnosis not present

## 2023-09-15 DIAGNOSIS — F1011 Alcohol abuse, in remission: Secondary | ICD-10-CM | POA: Diagnosis not present

## 2023-09-15 DIAGNOSIS — Z1159 Encounter for screening for other viral diseases: Secondary | ICD-10-CM | POA: Diagnosis not present

## 2023-09-16 ENCOUNTER — Ambulatory Visit
Admission: RE | Admit: 2023-09-16 | Discharge: 2023-09-16 | Disposition: A | Discharge status: Home / Self Care | Payer: 59 | Source: Ambulatory Visit | Attending: Family Medicine | Admitting: Family Medicine

## 2023-09-16 ENCOUNTER — Other Ambulatory Visit (HOSPITAL_COMMUNITY): Payer: Self-pay

## 2023-09-16 DIAGNOSIS — E2839 Other primary ovarian failure: Secondary | ICD-10-CM | POA: Diagnosis not present

## 2023-09-16 DIAGNOSIS — Z1231 Encounter for screening mammogram for malignant neoplasm of breast: Secondary | ICD-10-CM | POA: Diagnosis not present

## 2023-09-16 DIAGNOSIS — Z7952 Long term (current) use of systemic steroids: Secondary | ICD-10-CM | POA: Diagnosis not present

## 2023-09-16 DIAGNOSIS — M85852 Other specified disorders of bone density and structure, left thigh: Secondary | ICD-10-CM | POA: Diagnosis not present

## 2023-09-16 DIAGNOSIS — Z780 Asymptomatic menopausal state: Secondary | ICD-10-CM | POA: Diagnosis not present

## 2023-09-16 MED ORDER — PREDNISONE 5 MG PO TABS
10.0000 mg | ORAL_TABLET | Freq: Every morning | ORAL | 11 refills | Status: DC
  Filled 2023-09-16: qty 60, 30d supply, fill #0

## 2023-09-16 MED ORDER — PREDNISONE 20 MG PO TABS
20.0000 mg | ORAL_TABLET | Freq: Every morning | ORAL | 11 refills | Status: DC
  Filled 2023-09-16: qty 30, 30d supply, fill #0

## 2023-09-19 ENCOUNTER — Encounter: Payer: Self-pay | Admitting: Family Medicine

## 2023-09-19 DIAGNOSIS — M8580 Other specified disorders of bone density and structure, unspecified site: Secondary | ICD-10-CM | POA: Insufficient documentation

## 2023-09-20 ENCOUNTER — Encounter: Payer: Self-pay | Admitting: Family Medicine

## 2023-09-20 NOTE — Telephone Encounter (Signed)
DEXA  sent to Transplant team.  See pt's other comments

## 2023-09-21 ENCOUNTER — Other Ambulatory Visit (HOSPITAL_COMMUNITY): Payer: Self-pay

## 2023-09-21 DIAGNOSIS — B259 Cytomegaloviral disease, unspecified: Secondary | ICD-10-CM | POA: Diagnosis not present

## 2023-09-21 DIAGNOSIS — D84821 Immunodeficiency due to drugs: Secondary | ICD-10-CM | POA: Diagnosis not present

## 2023-09-21 DIAGNOSIS — T8641 Liver transplant rejection: Secondary | ICD-10-CM | POA: Diagnosis not present

## 2023-09-22 ENCOUNTER — Ambulatory Visit (INDEPENDENT_AMBULATORY_CARE_PROVIDER_SITE_OTHER)
Admission: RE | Admit: 2023-09-22 | Discharge: 2023-09-22 | Disposition: A | Discharge status: Home / Self Care | Payer: 59 | Source: Ambulatory Visit | Attending: Family Medicine | Admitting: Family Medicine

## 2023-09-22 ENCOUNTER — Ambulatory Visit (INDEPENDENT_AMBULATORY_CARE_PROVIDER_SITE_OTHER): Payer: 59 | Admitting: Family Medicine

## 2023-09-22 ENCOUNTER — Other Ambulatory Visit (HOSPITAL_COMMUNITY): Payer: Self-pay

## 2023-09-22 VITALS — BP 135/70 | HR 102 | Temp 97.6°F | Ht 62.5 in | Wt 201.0 lb

## 2023-09-22 DIAGNOSIS — M545 Low back pain, unspecified: Secondary | ICD-10-CM | POA: Diagnosis not present

## 2023-09-22 DIAGNOSIS — M5441 Lumbago with sciatica, right side: Secondary | ICD-10-CM

## 2023-09-22 DIAGNOSIS — M439 Deforming dorsopathy, unspecified: Secondary | ICD-10-CM | POA: Diagnosis not present

## 2023-09-22 DIAGNOSIS — M5442 Lumbago with sciatica, left side: Secondary | ICD-10-CM

## 2023-09-22 DIAGNOSIS — B370 Candidal stomatitis: Secondary | ICD-10-CM | POA: Diagnosis not present

## 2023-09-22 DIAGNOSIS — M47816 Spondylosis without myelopathy or radiculopathy, lumbar region: Secondary | ICD-10-CM | POA: Diagnosis not present

## 2023-09-22 MED ORDER — LIDOCAINE 5 % EX PTCH
1.0000 | MEDICATED_PATCH | Freq: Every day | CUTANEOUS | 0 refills | Status: DC | PRN
  Filled 2023-09-22: qty 15, 15d supply, fill #0

## 2023-09-22 MED ORDER — METHOCARBAMOL 500 MG PO TABS
250.0000 mg | ORAL_TABLET | Freq: Two times a day (BID) | ORAL | 0 refills | Status: DC | PRN
  Filled 2023-09-22: qty 15, 15d supply, fill #0

## 2023-09-22 NOTE — Patient Instructions (Addendum)
Use some heat on low back for 10 mintues at a time to relax muscles  Slow walking is good also if you tolerate that  Keep stretching  Continue PT   Voltaren gel may help also   I will look into muscle relaxers safe for liver and send to [facility]   Xray now

## 2023-09-22 NOTE — Progress Notes (Signed)
Subjective:    Patient ID: Jamie Coleman, female    DOB: 01-11-1971, 52 y.o.   MRN: [id number]  HPI  Wt Readings from Last 3 Encounters:  09/22/23 201 lb (91.2 kg)  07/28/23 188 lb (85.3 kg)  04/29/23 178 lb 4 oz (80.9 kg)   36.18 kg/m  Vitals:   09/22/23 1535 09/22/23 1558  BP: (!) 148/84 135/70  Pulse: (!) 102   Temp: 97.6 F (36.4 C)   SpO2: 98%    Pt presents for c/o pain in back and hips  Also possible thrush in mouth   Started a few weeks ago   Feels like nerve pain  Radiates to both lateral buttock/hip areas -worse on the left  Cannot find a good position to relieve it  Back feels very tight/pressure sensation   Feels unstable in left hip area  Radiates to front of leg but not groin  Nothing past that (knee and ankle/foot) are ok   No foot drop  No sudden loss of sensation or strength  No sudden loss of bowel or bladder control   Dry needling helped some  Less so the 2nd time   No trauma that she knows of    Per pt - has  been having PT treat back/hip/nerve pain the last two weeks at therapy. She has done dry needling, massage and given me exercises with no relief. It is a new pain I have not had before radiating from back to hips to thighs. Should I come to see you?    Xray today   DG Lumbar Spine 2-3 Views (Accession [id number]) (Order [id number]) Imaging Date: 09/22/2023 Department: [facility] Ordering/Authorizing: Ketra Duchesne, Audrie Gallus, MD   Exam Status  Status  Final [99]   PACS Intelerad Image Link   Show images for DG Lumbar Spine 2-3 Views Study Result  Narrative & Impression  CLINICAL DATA:  Low back pain radiating to the SI joints bilaterally. Worse on the left.   EXAM: LUMBAR SPINE - 2 VIEW   COMPARISON:  None Available.   FINDINGS: Five lumbar-type vertebral bodies. There is levoconvex curvature of the spine centered at L3. Preserved vertebral body heights. There is disc height loss at  L2-3, L3-4. Minimal endplate osteophytes. Prominent lower lumbar facet degenerative changes. No listhesis. Surgical clips overlie the right upper quadrant. If there is further concern of the acute process additional cross-sectional study could be considered as clinically appropriate.   IMPRESSION: Curvature of the spine with moderate degenerative changes.     Was in hosp 3.5 weeks for acute liver rejection after covid Doing better now   Was finally getting back to normal (mentally)  Low endurance   Some white discoloration of tongue -sides  Takes clotrimazole twice daily  Does not hurt  Taste is ok     Patient Active Problem List   Diagnosis Date Noted   Low back pain 09/22/2023   Thrush 09/22/2023   Osteopenia 09/19/2023   On prednisone therapy 04/29/2023   Abdominal weakness 04/29/2023   Multiple joint pain 02/15/2023   Menopause 02/15/2023   H/O liver transplant (HCC) 02/15/2023   Estrogen deficiency 09/24/2022   Acute on chronic alcoholic liver disease (HCC) 09/22/2022   Decompensation of cirrhosis of liver (HCC) 09/22/2022   History of alcoholic hepatitis 09/22/2022   Cirrhosis, alcoholic (HCC) 09/02/2022   Macrocytosis without anemia 09/02/2022   Acute liver failure without hepatic coma 09/01/2022   Bronchitis due to COVID-19 virus  05/05/2022   Elevated liver transaminase level 09/03/2019   Subclinical hypothyroidism 09/03/2019   Vitamin D deficiency 03/02/2018   Fatigue 03/02/2018   Pupil asymmetry 08/04/2016   Hemorrhoids 12/28/2014   Steroid-induced diabetes mellitus (HCC) 08/28/2014   Tremor 06/30/2013   Sebaceous cyst 02/15/2013   OTHER&UNSPECIFIED DISEASES THE ORAL SOFT TISSUES 07/25/2008   DERMATOFIBROMA, ARM 11/11/2007   NEOPLASM, SKIN, UNCERTAIN BEHAVIOR 02/11/2007   Hyperlipidemia 02/10/2007   Anxiety disorder 02/10/2007   Allergic rhinitis 02/10/2007   GERD 02/10/2007   IBS 02/10/2007   Past Medical History:  Diagnosis Date   Allergy     allergic rhinitis   Anxiety    situational   COVID 05/2021   Difficult intubation    states 2012 ablation surgery, had diff intubation,glide scope used, not noted in EPIC anesthesia record   Elevated hemoglobin A1c 08/08/2015   Elevated liver function tests 08/08/2015   GERD (gastroesophageal reflux disease)    Hyperlipidemia    Low serum vitamin D 08/08/2015   Status post endometrial ablation    Tennis elbow    currently in PT-10/16   Past Surgical History:  Procedure Laterality Date   BIOPSY  02/09/2022   Procedure: BIOPSY;  Surgeon: Lemar Lofty., MD;  Location: [facility];  Service: Gastroenterology;;   CESAREAN SECTION     COLONOSCOPY WITH PROPOFOL N/A 02/09/2022   Procedure: COLONOSCOPY WITH PROPOFOL;  Surgeon: Lemar Lofty., MD;  Location: [facility];  Service: Gastroenterology;  Laterality: N/A;   ENDOMETRIAL ABLATION     8/12   ESOPHAGOGASTRODUODENOSCOPY (EGD) WITH PROPOFOL N/A 02/09/2022   Procedure: ESOPHAGOGASTRODUODENOSCOPY (EGD) WITH PROPOFOL;  Surgeon: Meridee Score Netty Starring., MD;  Location: [facility];  Service: Gastroenterology;  Laterality: N/A;   EXTREMITY WIRE/PIN REMOVAL Left 07/16/2016   Procedure: PIN REMOVAL OF LEFT WRIST X 2;  Surgeon: Dominica Severin, MD;  Location: [facility];  Service: Orthopedics;  Laterality: Left;   FOOT SURGERY Left    LAPAROSCOPIC TUBAL LIGATION  06/09/2011   Procedure: LAPAROSCOPIC TUBAL LIGATION;  Surgeon: Melony Overly;  Location: [facility];  Service: Gynecology;  Laterality: N/A;   LIGAMENT REPAIR Left 05/08/2016   Procedure: LEFT WRIST SCAPHOLUNATE LIGAMENT RECONSTRUCTION WITH TENDON GRAFT PIN NEURECTOMY AND REPAIR;  Surgeon: Dominica Severin, MD;  Location: [facility];  Service: Orthopedics;  Laterality: Left;   LIVER TRANSPLANT  09/2022   POLYPECTOMY  02/09/2022   Procedure: POLYPECTOMY;  Surgeon: Mansouraty, Netty Starring., MD;  Location: [facility];  Service:  Gastroenterology;;   TONSILLECTOMY     as child   Social History   Tobacco Use   Smoking status: Former    Current packs/day: 0.00    Types: Cigarettes    Quit date: 07/25/1983    Years since quitting: 40.1   Smokeless tobacco: Never   Tobacco comments:    in high school  Vaping Use   Vaping status: Never Used  Substance Use Topics   Alcohol use: Not Currently    Alcohol/week: 14.0 standard drinks of alcohol    Types: 14 Glasses of wine per week    Comment: 6 shots daily 12 shots daily on weekends   Drug use: No   Family History  Problem Relation Age of Onset   Cancer Mother        [place] insitu of appendix   Heart disease Father        CAD   Diabetes Father        type II   Alzheimer's  disease Father    Colon polyps Father    Diabetes Brother        type II   Diabetes Maternal Grandfather    Diabetes Paternal Grandmother    Diabetes Paternal Grandfather    Breast cancer Neg Hx    Colon cancer Neg Hx    Stomach cancer Neg Hx    Esophageal cancer Neg Hx    Pancreatic cancer Neg Hx    Allergies  Allergen Reactions   Lipitor [Atorvastatin] Other (See Comments)    myalgias   Current Outpatient Medications on File Prior to Visit  Medication Sig Dispense Refill   aspirin 81 MG chewable tablet Chew by mouth daily.     Calcium Citrate-Vitamin D 315-5 MG-MCG TABS Take by mouth.     clotrimazole (MYCELEX) 10 MG troche Take 1 tablet (10 mg total) by mouth 2 (two) times daily Dissolve tablet (troche) slowly and completely in mouth after breakfast and dinner. 60 Troche 2   Continuous Glucose Sensor (FREESTYLE LIBRE 2 SENSOR) MISC Apply 1 sensor every 14 days for glucose monitoring.  Remove old sensor & replace with new sensor every 14 days 2 each 5   fexofenadine (ALLEGRA) 180 MG tablet Take 180 mg by mouth daily.     insulin glargine-yfgn (SEMGLEE) 100 UNIT/ML injection Inject 0.1 mLs (10 Units total) into the skin at bedtime. 10 mL 3   insulin lispro (HUMALOG) 100 UNIT/ML  KwikPen Please see diabetes discharge instructions. Max daily dose 110 units. 15 mL 2   Insulin Pen Needle 31G X 5 MM MISC Use as directed 100 each 3   mycophenolate (CELLCEPT) 250 MG capsule Take 6 capsules (1,500 mg total) by mouth every 12 (twelve) hours. 240 capsule 11   omeprazole (PRILOSEC) 20 MG capsule Take 20 mg by mouth daily.     Polyvinyl Alcohol-Povidone (REFRESH OP) Place 1-2 drops into both eyes 4 (four) times daily as needed (dry eye).     Specialty Vitamins Products (MG PLUS PROTEIN) 133 MG TABS Take 3 tablets (399 mg total) by mouth 3 (three) times daily with meals Please space out 2 hours from immunosuppression meds (prograf) 540 tablet 0   sulfamethoxazole-trimethoprim (BACTRIM DS) 800-160 MG tablet Take 1 tablet (160 mg of trimethoprim total) by mouth every Monday, Wednesday, and Friday for 30 days 12 tablet 2   tacrolimus (PROGRAF) 1 MG capsule Take 2 capsules (2 mg total) by mouth every morning AND 1 capsule (1 mg total) every evening. 90 capsule 11   valGANciclovir (VALCYTE) 450 MG tablet Take 2 tablets (900 mg total) by mouth once daily With largest meal of the day 60 tablet 11   No current facility-administered medications on file prior to visit.    Review of Systems  Constitutional:  Negative for activity change, appetite change, fatigue, fever and unexpected weight change.  HENT:  Negative for congestion, ear pain, rhinorrhea, sinus pressure and sore throat.   Eyes:  Negative for pain, redness and visual disturbance.  Respiratory:  Negative for cough, shortness of breath and wheezing.   Cardiovascular:  Negative for chest pain and palpitations.  Gastrointestinal:  Negative for abdominal pain, blood in stool, constipation and diarrhea.  Endocrine: Negative for polydipsia and polyuria.  Genitourinary:  Negative for dysuria, frequency and urgency.  Musculoskeletal:  Positive for back pain. Negative for arthralgias and myalgias.  Skin:  Negative for pallor and rash.   Allergic/Immunologic: Negative for environmental allergies.  Neurological:  Negative for dizziness, syncope and headaches.  Hematological:  Negative for adenopathy. Does not bruise/bleed easily.  Psychiatric/Behavioral:  Negative for decreased concentration and dysphoric mood. The patient is not nervous/anxious.        Objective:   Physical Exam Constitutional:      General: She is not in acute distress.    Appearance: Normal appearance. She is obese. She is not ill-appearing or diaphoretic.  HENT:     Head: Normocephalic and atraumatic.     Mouth/Throat:     Mouth: Mucous membranes are moist.     Comments: Some white coating of tongue on sides that is not removable   Eyes:     Conjunctiva/sclera: Conjunctivae normal.     Pupils: Pupils are equal, round, and reactive to light.  Cardiovascular:     Rate and Rhythm: Regular rhythm. Tachycardia present.  Pulmonary:     Effort: Pulmonary effort is normal. No respiratory distress.  Musculoskeletal:     Lumbar back: Spasms and tenderness present. No swelling. Decreased range of motion. Negative right straight leg raise test and negative left straight leg raise test.     Comments: No obv deformity of TS or LS  Some loss of lordosis  Tenderness over left greater than right lumbar musculature and SI area  Neg slr  Pain is worse with flex and improved with ext of LS  No neuro changes Normal rom of hips   Skin:    General: Skin is warm and dry.     Findings: No erythema or rash.  Neurological:     Mental Status: She is alert.     Sensory: No sensory deficit.     Motor: No weakness.     Coordination: Coordination normal.     Gait: Gait normal.     Deep Tendon Reflexes: Reflexes normal.  Psychiatric:        Mood and Affect: Mood normal.           Assessment & Plan:   Problem List Items Addressed This Visit       Digestive   Thrush    In a liver transplant pt taking prednisone and sulfa antibiotic Overall no  discomfort or change in taste  Taking clotrimazole troches bid  Only visible on sides of tongue today   Encouraged change in toothbrush frequently  Update if symptoms occur  Continue follow up with transplant team        Other   Low back pain - Primary    Today worse on the left  ? L3 comp fracture vs deg change on dexa (has osteopenia on prednisone)  On imaging today some degenerative discussed changes, osteophytes and facet change noted with some scoliosis  Recommend heat and continued stretching and PT  Trial of low dose methocarbamol (250 mg) and lidoderm patch with great caution due to liver transplant status  Update if not starting to improve in a week or if worsening  Call back and Er precautions noted in detail today         Relevant Medications   methocarbamol (ROBAXIN) 500 MG tablet   Other Relevant Orders   DG Lumbar Spine 2-3 Views (Completed)

## 2023-09-22 NOTE — Assessment & Plan Note (Addendum)
Today worse on the left  ? L3 comp fracture vs deg change on dexa (has osteopenia on prednisone)  On imaging today some degenerative discussed changes, osteophytes and facet change noted with some scoliosis  Recommend heat and continued stretching and PT  Trial of low dose methocarbamol (250 mg) and lidoderm patch with great caution due to liver transplant status  Update if not starting to improve in a week or if worsening  Call back and Er precautions noted in detail today

## 2023-09-22 NOTE — Assessment & Plan Note (Signed)
In a liver transplant pt taking prednisone and sulfa antibiotic Overall no discomfort or change in taste  Taking clotrimazole troches bid  Only visible on sides of tongue today   Encouraged change in toothbrush frequently  Update if symptoms occur  Continue follow up with transplant team

## 2023-09-23 ENCOUNTER — Other Ambulatory Visit (HOSPITAL_COMMUNITY): Payer: Self-pay

## 2023-09-23 ENCOUNTER — Other Ambulatory Visit: Payer: Self-pay

## 2023-09-23 ENCOUNTER — Ambulatory Visit: Payer: 59

## 2023-09-23 DIAGNOSIS — M62838 Other muscle spasm: Secondary | ICD-10-CM | POA: Diagnosis not present

## 2023-09-23 DIAGNOSIS — M6281 Muscle weakness (generalized): Secondary | ICD-10-CM

## 2023-09-23 DIAGNOSIS — R279 Unspecified lack of coordination: Secondary | ICD-10-CM

## 2023-09-23 DIAGNOSIS — R293 Abnormal posture: Secondary | ICD-10-CM | POA: Diagnosis not present

## 2023-09-23 MED ORDER — PREDNISONE 5 MG PO TABS
ORAL_TABLET | ORAL | 11 refills | Status: DC
  Filled 2023-09-23: qty 150, 30d supply, fill #0

## 2023-09-23 NOTE — Therapy (Signed)
OUTPATIENT PHYSICAL THERAPY FEMALE PELVIC TREATMENT   Patient Name: Jamie Coleman MRN: [id number] DOB:09-25-1971, 52 y.o., female Today's Date: 09/23/2023  END OF SESSION:  PT End of Session - 09/23/23 1535     Visit Number 9    Date for PT Re-Evaluation 01/10/24    Authorization Type [employer] employee    PT Start Time 1530    PT Stop Time 1610    PT Time Calculation (min) 40 min    Activity Tolerance Patient tolerated treatment well    Behavior During Therapy [facility] for tasks assessed/performed              Past Medical History:  Diagnosis Date   Allergy    allergic rhinitis   Anxiety    situational   COVID 05/2021   Difficult intubation    states 2012 ablation surgery, had diff intubation,glide scope used, not noted in EPIC anesthesia record   Elevated hemoglobin A1c 08/08/2015   Elevated liver function tests 08/08/2015   GERD (gastroesophageal reflux disease)    Hyperlipidemia    Low serum vitamin D 08/08/2015   Status post endometrial ablation    Tennis elbow    currently in PT-10/16   Past Surgical History:  Procedure Laterality Date   BIOPSY  02/09/2022   Procedure: BIOPSY;  Surgeon: Lemar Lofty., MD;  Location: [facility];  Service: Gastroenterology;;   CESAREAN SECTION     COLONOSCOPY WITH PROPOFOL N/A 02/09/2022   Procedure: COLONOSCOPY WITH PROPOFOL;  Surgeon: Lemar Lofty., MD;  Location: [facility];  Service: Gastroenterology;  Laterality: N/A;   ENDOMETRIAL ABLATION     8/12   ESOPHAGOGASTRODUODENOSCOPY (EGD) WITH PROPOFOL N/A 02/09/2022   Procedure: ESOPHAGOGASTRODUODENOSCOPY (EGD) WITH PROPOFOL;  Surgeon: Meridee Score Netty Starring., MD;  Location: [facility];  Service: Gastroenterology;  Laterality: N/A;   EXTREMITY WIRE/PIN REMOVAL Left 07/16/2016   Procedure: PIN REMOVAL OF LEFT WRIST X 2;  Surgeon: Dominica Severin, MD;  Location: [facility];  Service: Orthopedics;  Laterality: Left;   FOOT SURGERY  Left    LAPAROSCOPIC TUBAL LIGATION  06/09/2011   Procedure: LAPAROSCOPIC TUBAL LIGATION;  Surgeon: Melony Overly;  Location: [facility];  Service: Gynecology;  Laterality: N/A;   LIGAMENT REPAIR Left 05/08/2016   Procedure: LEFT WRIST SCAPHOLUNATE LIGAMENT RECONSTRUCTION WITH TENDON GRAFT PIN NEURECTOMY AND REPAIR;  Surgeon: Dominica Severin, MD;  Location: [facility];  Service: Orthopedics;  Laterality: Left;   LIVER TRANSPLANT  09/2022   POLYPECTOMY  02/09/2022   Procedure: POLYPECTOMY;  Surgeon: Mansouraty, Netty Starring., MD;  Location: [facility];  Service: Gastroenterology;;   TONSILLECTOMY     as child   Patient Active Problem List   Diagnosis Date Noted   Low back pain 09/22/2023   Thrush 09/22/2023   Osteopenia 09/19/2023   On prednisone therapy 04/29/2023   Abdominal weakness 04/29/2023   Multiple joint pain 02/15/2023   Menopause 02/15/2023   H/O liver transplant (HCC) 02/15/2023   Estrogen deficiency 09/24/2022   Acute on chronic alcoholic liver disease (HCC) 09/22/2022   Decompensation of cirrhosis of liver (HCC) 09/22/2022   History of alcoholic hepatitis 09/22/2022   Cirrhosis, alcoholic (HCC) 09/02/2022   Macrocytosis without anemia 09/02/2022   Acute liver failure without hepatic coma 09/01/2022   Bronchitis due to COVID-19 virus 05/05/2022   Elevated liver transaminase level 09/03/2019   Subclinical hypothyroidism 09/03/2019   Vitamin D deficiency 03/02/2018   Fatigue 03/02/2018   Pupil asymmetry 08/04/2016   Hemorrhoids  12/28/2014   Steroid-induced diabetes mellitus (HCC) 08/28/2014   Tremor 06/30/2013   Sebaceous cyst 02/15/2013   OTHER&UNSPECIFIED DISEASES THE ORAL SOFT TISSUES 07/25/2008   DERMATOFIBROMA, ARM 11/11/2007   NEOPLASM, SKIN, UNCERTAIN BEHAVIOR 02/11/2007   Hyperlipidemia 02/10/2007   Anxiety disorder 02/10/2007   Allergic rhinitis 02/10/2007   GERD 02/10/2007   IBS 02/10/2007    PCP: Judy Pimple, MD  REFERRING PROVIDER:  Judy Pimple, MD  REFERRING DIAG: R19.8 (ICD-10-CM) - Abdominal weakness  THERAPY DIAG:  Muscle weakness (generalized)  Abnormal posture  Unspecified lack of coordination  Rationale for Evaluation and Treatment: Rehabilitation  ONSET DATE: December 2023  SUBJECTIVE:                                                                                                                                                                                           SUBJECTIVE STATEMENT: Pt states that dry needling helps for the first day or 2, and then the pain returns. She went to MD and had an x-ray without any notable findings.   PAIN:  PAIN:  Are you having pain? Yes NPRS scale: 2/10 Pain location: low back Pain orientation: Bilateral  PAIN TYPE: radiating and aching/sharp Pain description: intermittent  Aggravating factors: walking, bending over getting ready to sit Relieving factors: stretching, flexion to stretch out    PRECAUTIONS: Other: Liver transplant December 2023, c-section  RED FLAGS: None   WEIGHT BEARING RESTRICTIONS: No  FALLS:  Has patient fallen in last 6 months? No  LIVING ENVIRONMENT: Lives with: lives with their family Lives in: House/apartment   OCCUPATION: nurse in GI  PLOF: Independent  PATIENT GOALS: to learn safe abdominal exercises   PERTINENT HISTORY:  Liver transplant, c-section   BOWEL MOVEMENT: Pain with bowel movement: No Type of bowel movement:Frequency 1-2x/day and Strain No Fully empty rectum: Yes: - Leakage: No Pads: No Fiber supplement: No  URINATION: Pain with urination: No Fully empty bladder: Yes: - Stream: Strong Urgency: No Frequency: every 3-4 hours  Leakage:  none Pads: No  INTERCOURSE: Pain with intercourse:  no pain   PREGNANCY: Vaginal deliveries no  Tearing No C-section deliveries 1 - twins - abdominal muscle tearing Currently pregnant No  PROLAPSE: None   OBJECTIVE:  Note: Objective measures  were completed at Evaluation unless otherwise noted. 09/23/23: Lt posterior/Rt anterior pelvic rotation  07/26/23: COGNITION: Overall cognitive status: Within functional limits for tasks assessed     SENSATION: Light touch: Appears intact Proprioception: Appears intact   FUNCTIONAL TESTS:  Squat: Rt weight shift with Lt trunk rotation Single leg stance:  Rt: unstable, shaking, pelvic drop  Lt:  unstable, shaking, pelvic drop Chin up test: significant difficulty, doming in lower abdominals ASLR: (-) bil, but shaking evident in bil LE Sit-up test: unable 0/3 Diastasis recti abdominus: 2 finger width separation with distortion inferior to umbilicus  GAIT: Comments: decreased trunk rotation  POSTURE: rounded shoulders, forward head, decreased lumbar lordosis, increased thoracic kyphosis, posterior pelvic tilt, and -  LUMBARAROM/PROM:  A/PROM A/PROM  Eval % available  Flexion 100  Extension 75  Right lateral flexion 50  Left lateral flexion 75  Right rotation 75, pain under Rt ribs  Left rotation 100   (Blank rows = not tested)   PALPATION:   General  Significant restriction throughout upper abdominals  TODAY'S TREATMENT:                                                                                                                              DATE:  09/23/23 Manual: Trigger Point Dry-Needling  Treatment instructions: Expect mild to moderate muscle soreness. S/S of pneumothorax if dry needled over a lung field, and to seek immediate medical attention should they occur. Patient verbalized understanding of these instructions and education.  Patient Consent Given: Yes Education handout provided: Yes Muscles treated: Bil glutes  Electrical stimulation performed: No Parameters: N/A Treatment response/outcome: twitch response/release Soft tissue mobilization to lumbar paraspinals and glutes Neuromuscular re-education: Muscle energy technique for Lt posterior and Rt  anterior pelvic rotation  Shotgun technique for pelvic stability Therapeutic activities: Sit<>Stand with core engagement practice Forward bending over counter with core engagement Working on relaxing glutes in standing   09/14/23 Manual: Trigger Point Dry-Needling  Treatment instructions: Expect mild to moderate muscle soreness. S/S of pneumothorax if dry needled over a lung field, and to seek immediate medical attention should they occur. Patient verbalized understanding of these instructions and education.  Patient Consent Given: Yes Education handout provided: Yes Muscles treated: Bil glutes  Electrical stimulation performed: No Parameters: N/A Treatment response/outcome: twitch response/release Soft tissue mobilization to lumbar paraspinals and glutes Exercises: Bridges with hip adduction 2 x 10 Bridge with clam shell 2 x 5  09/06/23 Manual: Trigger Point Dry-Needling  Treatment instructions: Expect mild to moderate muscle soreness. S/S of pneumothorax if dry needled over a lung field, and to seek immediate medical attention should they occur. Patient verbalized understanding of these instructions and education.  Patient Consent Given: Yes Education handout provided: Yes Muscles treated: Bil glutes  Electrical stimulation performed: No Parameters: N/A Treatment response/outcome: twitch response/release Soft tissue mobilization to lumbar paraspinals and glutes  Abdominal scar tissue mobilization in supine Diaphragm release bil (Lt>Rt)  PATIENT EDUCATION:  Education details: See above Person educated: Patient Education method: Explanation, Demonstration, Tactile cues, Verbal cues, and Handouts Education comprehension: verbalized understanding  HOME EXERCISE PROGRAM: [id number]  ASSESSMENT:  CLINICAL IMPRESSION: Due to when patient is having pain and core weakness/scar tissue restriction, it was consistent with pelvic instability/SIJ pain. Due to this we examined  pelvic alignment and stability and  she was found to have Lt posterior and Rt anterior rotation. She did very well with muscle energy techniques and shotgun stabilization with good reduction in sit<>stand pain and forward leaning after this was performed. Dry needling performed again after this in order to help reduce significant trigger points that were present again. Hopefully working on improving pelvic alignment and stabilization will allow dry needling to make more long term changes without gluteal muscles spasming to help stabilize. However, believe she is in the habit of clenching glutes due to core weakness; we went over active relaxation in standing. Written handout provided on pelvic alignment and stabilization exercises. She will continue to benefit from skilled PT intervention in order to improve core strength and begin/progress functional strengthening program.   OBJECTIVE IMPAIRMENTS: decreased activity tolerance, decreased coordination, decreased endurance, decreased mobility, decreased strength, increased fascial restrictions, increased muscle spasms, impaired tone, postural dysfunction, and pain.   ACTIVITY LIMITATIONS: lifting, bending, sitting, squatting, and bed mobility  PARTICIPATION LIMITATIONS: community activity and occupation  PERSONAL FACTORS: 1 comorbidity: medical history  are also affecting patient's functional outcome.   REHAB POTENTIAL: Good  CLINICAL DECISION MAKING: Stable/uncomplicated  EVALUATION COMPLEXITY: Low   GOALS: Goals reviewed with patient? Yes  SHORT TERM GOALS: Target date: 08/23/23 - updated 08/26/23  Pt will be independent with HEP.   Baseline: Goal status: MET 08/26/23  2.  Pt will be able to perform active transversus abdominus contraction with appropriate pressure management and breathing.  Baseline:  Goal status: MET 08/26/23  3.  Pt will be independent with self scar tissue mobilization.  Baseline:  Goal status: MET  08/26/23    LONG TERM GOALS: Target date: 01/10/2024 - updated 08/26/23  Pt will be independent with advanced HEP.   Baseline:  Goal status: IN PROGRESS  2.  Pt will be able to sit up in bed and perform bed mobility without abdominal distortion.  Baseline:  Goal status: IN PROGRESS  3.  Pt will be able to score 2/3 on sit-up test without abdominal distortion. Baseline:  Goal status: IN PROGRESS   PLAN:  PT FREQUENCY: 1-2x/week  PT DURATION: 8 weeks  PLANNED INTERVENTIONS: 97110-Therapeutic exercises, [id number]- Therapeutic activity, [id number]- Neuromuscular re-education, 97535- Self Care, [id number]- Manual therapy, Patient/Family education, Dry Needling, Biofeedback, and [id number]- PT Re-evaluation  PLAN FOR NEXT SESSION: Plan to progress core strengthening; mobility exercises; plan to treat low back tightness.    Julio Alm, PT, DPT12/12/244:31 PM

## 2023-09-24 ENCOUNTER — Other Ambulatory Visit (HOSPITAL_COMMUNITY): Payer: Self-pay

## 2023-09-24 DIAGNOSIS — T380X5A Adverse effect of glucocorticoids and synthetic analogues, initial encounter: Secondary | ICD-10-CM | POA: Diagnosis not present

## 2023-09-24 DIAGNOSIS — R739 Hyperglycemia, unspecified: Secondary | ICD-10-CM | POA: Diagnosis not present

## 2023-09-24 MED ORDER — PREDNISONE 5 MG PO TABS
5.0000 mg | ORAL_TABLET | Freq: Every day | ORAL | 11 refills | Status: DC
  Filled 2023-09-24 (×3): qty 150, 150d supply, fill #0

## 2023-09-24 MED ORDER — INSULIN LISPRO (1 UNIT DIAL) 100 UNIT/ML (KWIKPEN)
110.0000 [IU] | PEN_INJECTOR | Freq: Every day | SUBCUTANEOUS | 2 refills | Status: DC
  Filled 2023-09-24: qty 15, 13d supply, fill #0

## 2023-09-25 ENCOUNTER — Other Ambulatory Visit (HOSPITAL_COMMUNITY): Payer: Self-pay

## 2023-09-28 DIAGNOSIS — D849 Immunodeficiency, unspecified: Secondary | ICD-10-CM | POA: Diagnosis not present

## 2023-09-28 DIAGNOSIS — B259 Cytomegaloviral disease, unspecified: Secondary | ICD-10-CM | POA: Diagnosis not present

## 2023-09-28 DIAGNOSIS — T8641 Liver transplant rejection: Secondary | ICD-10-CM | POA: Diagnosis not present

## 2023-09-29 ENCOUNTER — Other Ambulatory Visit (HOSPITAL_COMMUNITY): Payer: Self-pay

## 2023-09-29 ENCOUNTER — Other Ambulatory Visit: Payer: Self-pay

## 2023-09-29 MED ORDER — PREDNISONE 5 MG PO TABS
5.0000 mg | ORAL_TABLET | Freq: Every day | ORAL | 11 refills | Status: DC
  Filled 2023-09-29 (×2): qty 120, 120d supply, fill #0

## 2023-09-29 MED ORDER — TACROLIMUS 1 MG PO CAPS
ORAL_CAPSULE | ORAL | 11 refills | Status: DC
  Filled 2023-09-29: qty 120, 30d supply, fill #0

## 2023-09-30 ENCOUNTER — Other Ambulatory Visit: Payer: Self-pay

## 2023-09-30 ENCOUNTER — Ambulatory Visit: Payer: 59

## 2023-09-30 DIAGNOSIS — R293 Abnormal posture: Secondary | ICD-10-CM | POA: Diagnosis not present

## 2023-09-30 DIAGNOSIS — M62838 Other muscle spasm: Secondary | ICD-10-CM

## 2023-09-30 DIAGNOSIS — M6281 Muscle weakness (generalized): Secondary | ICD-10-CM

## 2023-09-30 DIAGNOSIS — R279 Unspecified lack of coordination: Secondary | ICD-10-CM

## 2023-09-30 NOTE — Therapy (Addendum)
 OUTPATIENT PHYSICAL THERAPY FEMALE PELVIC TREATMENT   Patient Name: Jamie Coleman MRN: [id number] DOB:05-May-1971, 52 y.o., female Today's Date: 09/30/2023  END OF SESSION:  PT End of Session - 09/30/23 1535     Visit Number 10    Date for PT Re-Evaluation 01/10/24    Authorization Type [employer] employee    PT Start Time 1530    PT Stop Time 1610    PT Time Calculation (min) 40 min    Activity Tolerance Patient tolerated treatment well    Behavior During Therapy [facility] for tasks assessed/performed              Past Medical History:  Diagnosis Date   Allergy    allergic rhinitis   Anxiety    situational   COVID 05/2021   Difficult intubation    states 2012 ablation surgery, had diff intubation,glide scope used, not noted in EPIC anesthesia record   Elevated hemoglobin A1c 08/08/2015   Elevated liver function tests 08/08/2015   GERD (gastroesophageal reflux disease)    Hyperlipidemia    Low serum vitamin D  08/08/2015   Status post endometrial ablation    Tennis elbow    currently in PT-10/16   Past Surgical History:  Procedure Laterality Date   BIOPSY  02/09/2022   Procedure: BIOPSY;  Surgeon: Wilhelmenia Aloha Raddle., MD;  Location: [facility];  Service: Gastroenterology;;   CESAREAN SECTION     COLONOSCOPY WITH PROPOFOL  N/A 02/09/2022   Procedure: COLONOSCOPY WITH PROPOFOL ;  Surgeon: Wilhelmenia Aloha Raddle., MD;  Location: [facility];  Service: Gastroenterology;  Laterality: N/A;   ENDOMETRIAL ABLATION     8/12   ESOPHAGOGASTRODUODENOSCOPY (EGD) WITH PROPOFOL  N/A 02/09/2022   Procedure: ESOPHAGOGASTRODUODENOSCOPY (EGD) WITH PROPOFOL ;  Surgeon: Wilhelmenia Aloha Raddle., MD;  Location: [facility];  Service: Gastroenterology;  Laterality: N/A;   EXTREMITY WIRE/PIN REMOVAL Left 07/16/2016   Procedure: PIN REMOVAL OF LEFT WRIST X 2;  Surgeon: Elsie Mussel, MD;  Location: [facility];  Service: Orthopedics;  Laterality: Left;   FOOT SURGERY  Left    LAPAROSCOPIC TUBAL LIGATION  06/09/2011   Procedure: LAPAROSCOPIC TUBAL LIGATION;  Surgeon: Bobie DELENA Cary;  Location: [facility];  Service: Gynecology;  Laterality: N/A;   LIGAMENT REPAIR Left 05/08/2016   Procedure: LEFT WRIST SCAPHOLUNATE LIGAMENT RECONSTRUCTION WITH TENDON GRAFT PIN NEURECTOMY AND REPAIR;  Surgeon: Elsie Mussel, MD;  Location:  [facility];  Service: Orthopedics;  Laterality: Left;   LIVER TRANSPLANT  09/2022   POLYPECTOMY  02/09/2022   Procedure: POLYPECTOMY;  Surgeon: Mansouraty, Aloha Raddle., MD;  Location: [facility];  Service: Gastroenterology;;   TONSILLECTOMY     as child   Patient Active Problem List   Diagnosis Date Noted   Low back pain 09/22/2023   Thrush 09/22/2023   Osteopenia 09/19/2023   On prednisone  therapy 04/29/2023   Abdominal weakness 04/29/2023   Multiple joint pain 02/15/2023   Menopause 02/15/2023   H/O liver transplant (HCC) 02/15/2023   Estrogen deficiency 09/24/2022   Acute on chronic alcoholic liver disease (HCC) 09/22/2022   Decompensation of cirrhosis of liver (HCC) 09/22/2022   History of alcoholic hepatitis 09/22/2022   Cirrhosis, alcoholic (HCC) 09/02/2022   Macrocytosis without anemia 09/02/2022   Acute liver failure without hepatic coma 09/01/2022   Bronchitis due to COVID-19 virus 05/05/2022   Elevated liver transaminase level 09/03/2019   Subclinical hypothyroidism 09/03/2019   Vitamin D  deficiency 03/02/2018   Fatigue 03/02/2018   Pupil asymmetry 08/04/2016   Hemorrhoids  12/28/2014   Steroid-induced diabetes mellitus (HCC) 08/28/2014   Tremor 06/30/2013   Sebaceous cyst 02/15/2013   OTHER&UNSPECIFIED DISEASES THE ORAL SOFT TISSUES 07/25/2008   DERMATOFIBROMA, ARM 11/11/2007   NEOPLASM, SKIN, UNCERTAIN BEHAVIOR 02/11/2007   Hyperlipidemia 02/10/2007   Anxiety disorder 02/10/2007   Allergic rhinitis 02/10/2007   GERD 02/10/2007   IBS 02/10/2007    PCP: Randeen Laine LABOR, MD  REFERRING PROVIDER:  Randeen Laine LABOR, MD  REFERRING DIAG: R19.8 (ICD-10-CM) - Abdominal weakness  THERAPY DIAG:  Muscle weakness (generalized)  Abnormal posture  Unspecified lack of coordination  Other muscle spasm  Rationale for Evaluation and Treatment: Rehabilitation  ONSET DATE: December 2023  SUBJECTIVE:                                                                                                                                                                                           SUBJECTIVE STATEMENT: Pt states that last time did not help low back at all. She has continued working on exercises. She is having more pain in upper abdomen as well.   PAIN:  PAIN:  Are you having pain? Yes NPRS scale: 8/10 Pain location: low back Pain orientation: Bilateral  PAIN TYPE: tight Pain description: intermittent  Aggravating factors: walking, bending over getting ready to sit Relieving factors: stretching, flexion to stretch out    PRECAUTIONS: Other: Liver transplant December 2023, c-section  RED FLAGS: None   WEIGHT BEARING RESTRICTIONS: No  FALLS:  Has patient fallen in last 6 months? No  LIVING ENVIRONMENT: Lives with: lives with their family Lives in: House/apartment   OCCUPATION: nurse in GI  PLOF: Independent  PATIENT GOALS: to learn safe abdominal exercises   PERTINENT HISTORY:  Liver transplant, c-section   BOWEL MOVEMENT: Pain with bowel movement: No Type of bowel movement:Frequency 1-2x/day and Strain No Fully empty rectum: Yes: - Leakage: No Pads: No Fiber supplement: No  URINATION: Pain with urination: No Fully empty bladder: Yes: - Stream: Strong Urgency: No Frequency: every 3-4 hours  Leakage: none Pads: No  INTERCOURSE: Pain with intercourse: no pain   PREGNANCY: Vaginal deliveries no  Tearing No C-section deliveries 1 - twins - abdominal muscle tearing Currently pregnant No  PROLAPSE: None   OBJECTIVE:  Note: Objective measures were  completed at Evaluation unless otherwise noted. 09/23/23: Lt posterior/Rt anterior pelvic rotation  07/26/23: COGNITION: Overall cognitive status: Within functional limits for tasks assessed     SENSATION: Light touch: Appears intact Proprioception: Appears intact   FUNCTIONAL TESTS:  Squat: Rt weight shift with Lt trunk rotation Single leg stance:  Rt: unstable, shaking, pelvic drop  Lt: unstable,  shaking, pelvic drop Chin up test: significant difficulty, doming in lower abdominals ASLR: (-) bil, but shaking evident in bil LE Sit-up test: unable 0/3 Diastasis recti abdominus: 2 finger width separation with distortion inferior to umbilicus  GAIT: Comments: decreased trunk rotation  POSTURE: rounded shoulders, forward head, decreased lumbar lordosis, increased thoracic kyphosis, posterior pelvic tilt, and -  LUMBARAROM/PROM:  A/PROM A/PROM  Eval % available  Flexion 100  Extension 75  Right lateral flexion 50  Left lateral flexion 75  Right rotation 75, pain under Rt ribs  Left rotation 100   (Blank rows = not tested)   PALPATION:   General  Significant restriction throughout upper abdominals  TODAY'S TREATMENT:                                                                                                                              DATE:  09/30/23 Manual: Abdominal scar tissue mobilization in supine Diaphragm release bil (Lt>Rt) Neuromuscular re-education: Bridge with hip adduction, transversus abdominus, and pelvic floor muscle 2 x 10 Squats to table no weight 10x Squats with core activation 8 lbs 10x Exercises: Butterfly 10 breaths Seated pelvic tilts 2 x 10  09/23/23 Manual: Trigger Point Dry-Needling  Treatment instructions: Expect mild to moderate muscle soreness. S/S of pneumothorax if dry needled over a lung field, and to seek immediate medical attention should they occur. Patient verbalized understanding of these instructions and  education.  Patient Consent Given: Yes Education handout provided: Yes Muscles treated: Bil glutes  Electrical stimulation performed: No Parameters: N/A Treatment response/outcome: twitch response/release Soft tissue mobilization to lumbar paraspinals and glutes Neuromuscular re-education: Muscle energy technique for Lt posterior and Rt anterior pelvic rotation  Shotgun technique for pelvic stability Therapeutic activities: Sit<>Stand with core engagement practice Forward bending over counter with core engagement Working on relaxing glutes in standing   09/14/23 Manual: Trigger Point Dry-Needling  Treatment instructions: Expect mild to moderate muscle soreness. S/S of pneumothorax if dry needled over a lung field, and to seek immediate medical attention should they occur. Patient verbalized understanding of these instructions and education.  Patient Consent Given: Yes Education handout provided: Yes Muscles treated: Bil glutes  Electrical stimulation performed: No Parameters: N/A Treatment response/outcome: twitch response/release Soft tissue mobilization to lumbar paraspinals and glutes Exercises: Bridges with hip adduction 2 x 10 Bridge with clam shell 2 x 5   PATIENT EDUCATION:  Education details: See above Person educated: Patient Education method: Programmer, multimedia, Demonstration, Actor cues, Verbal cues, and Handouts Education comprehension: verbalized understanding  HOME EXERCISE PROGRAM: [id number]  ASSESSMENT:  CLINICAL IMPRESSION: Pt still having considerable low back and hip pain that was not impacted at all by dry needling last session. She does not feel like pelvic stabilization exercises are any more helpful than just sitting and resting; she was instructed to stop these for the time being. Due to increased abdominal pain and tightness that is also likely impacting low back pain, we continued  scar tissue mobilization to this area with good tolerance. She was able  to perform some mobility exercises, but pelvic tilts increased pain. Squats were tolerable and she was encouraged to work on when she has pain to see if they help. Wondering if her pain is decreasing due to tapering off of prednisone . We will continue to monitor and treat accordingly. She will continue to benefit from skilled PT intervention in order to improve core strength and begin/progress functional strengthening program.   OBJECTIVE IMPAIRMENTS: decreased activity tolerance, decreased coordination, decreased endurance, decreased mobility, decreased strength, increased fascial restrictions, increased muscle spasms, impaired tone, postural dysfunction, and pain.   ACTIVITY LIMITATIONS: lifting, bending, sitting, squatting, and bed mobility  PARTICIPATION LIMITATIONS: community activity and occupation  PERSONAL FACTORS: 1 comorbidity: medical history are also affecting patient's functional outcome.   REHAB POTENTIAL: Good  CLINICAL DECISION MAKING: Stable/uncomplicated  EVALUATION COMPLEXITY: Low   GOALS: Goals reviewed with patient? Yes  SHORT TERM GOALS: Target date: 08/23/23 - updated 08/26/23  Pt will be independent with HEP.   Baseline: Goal status: MET 08/26/23  2.  Pt will be able to perform active transversus abdominus contraction with appropriate pressure management and breathing.  Baseline:  Goal status: MET 08/26/23  3.  Pt will be independent with self scar tissue mobilization.  Baseline:  Goal status: MET 08/26/23    LONG TERM GOALS: Target date: 01/10/2024 - updated 08/26/23 - updated 09/30/23  Pt will be independent with advanced HEP.   Baseline:  Goal status: IN PROGRESS 09/30/23  2.  Pt will be able to sit up in bed and perform bed mobility without abdominal distortion.  Baseline:  Goal status: IN PROGRESS 09/30/23  3.  Pt will be able to score 2/3 on sit-up test without abdominal distortion. Baseline:  Goal status: IN PROGRESS  09/30/23   PLAN:  PT FREQUENCY: 1-2x/week  PT DURATION: 8 weeks  PLANNED INTERVENTIONS: 97110-Therapeutic exercises, 97530- Therapeutic activity, 97112- Neuromuscular re-education, 97535- Self Care, [id number]- Manual therapy, Patient/Family education, Dry Needling, Biofeedback, and [id number]- PT Re-evaluation  PLAN FOR NEXT SESSION: Plan to progress core strengthening; mobility exercises; plan to treat low back tightness.    Josette Mares, PT, DPT12/19/244:19 PM  PHYSICAL THERAPY DISCHARGE SUMMARY  Visits from Start of Care: 10  Current functional level related to goals / functional outcomes: Unknown   Remaining deficits: See above   Education / Equipment: HEP   Patient agrees to discharge. Patient goals were partially met. Patient is being discharged due to not returning since the last visit.  Josette Mares, PT, DPT08/01/2509:21 AM

## 2023-10-01 ENCOUNTER — Other Ambulatory Visit (HOSPITAL_COMMUNITY): Payer: Self-pay

## 2023-10-02 ENCOUNTER — Encounter: Payer: Self-pay | Admitting: Family Medicine

## 2023-10-02 DIAGNOSIS — M5442 Lumbago with sciatica, left side: Secondary | ICD-10-CM

## 2023-10-04 DIAGNOSIS — T8641 Liver transplant rejection: Secondary | ICD-10-CM | POA: Diagnosis not present

## 2023-10-04 DIAGNOSIS — D84821 Immunodeficiency due to drugs: Secondary | ICD-10-CM | POA: Diagnosis not present

## 2023-10-04 DIAGNOSIS — B259 Cytomegaloviral disease, unspecified: Secondary | ICD-10-CM | POA: Diagnosis not present

## 2023-10-05 ENCOUNTER — Other Ambulatory Visit (HOSPITAL_BASED_OUTPATIENT_CLINIC_OR_DEPARTMENT_OTHER): Payer: Self-pay

## 2023-10-05 ENCOUNTER — Other Ambulatory Visit (HOSPITAL_COMMUNITY): Payer: Self-pay

## 2023-10-05 ENCOUNTER — Ambulatory Visit: Payer: 59

## 2023-10-05 MED ORDER — GABAPENTIN 100 MG PO CAPS
100.0000 mg | ORAL_CAPSULE | Freq: Three times a day (TID) | ORAL | 1 refills | Status: DC
  Filled 2023-10-05: qty 90, 30d supply, fill #0
  Filled 2023-11-10: qty 90, 30d supply, fill #1

## 2023-10-07 ENCOUNTER — Other Ambulatory Visit (HOSPITAL_COMMUNITY): Payer: Self-pay

## 2023-10-07 MED ORDER — PREDNISONE 5 MG PO TABS
17.5000 mg | ORAL_TABLET | Freq: Every day | ORAL | 11 refills | Status: DC
  Filled 2023-10-07: qty 105, 30d supply, fill #0

## 2023-10-08 ENCOUNTER — Encounter: Payer: Self-pay | Admitting: *Deleted

## 2023-10-08 ENCOUNTER — Other Ambulatory Visit (HOSPITAL_COMMUNITY): Payer: Self-pay

## 2023-10-08 ENCOUNTER — Other Ambulatory Visit: Payer: Self-pay

## 2023-10-08 ENCOUNTER — Encounter: Payer: 59 | Admitting: Family Medicine

## 2023-10-11 DIAGNOSIS — T8641 Liver transplant rejection: Secondary | ICD-10-CM | POA: Diagnosis not present

## 2023-10-11 DIAGNOSIS — B259 Cytomegaloviral disease, unspecified: Secondary | ICD-10-CM | POA: Diagnosis not present

## 2023-10-11 DIAGNOSIS — D849 Immunodeficiency, unspecified: Secondary | ICD-10-CM | POA: Diagnosis not present

## 2023-10-14 ENCOUNTER — Other Ambulatory Visit (HOSPITAL_COMMUNITY): Payer: Self-pay

## 2023-10-14 MED ORDER — TACROLIMUS 1 MG PO CAPS: ORAL_CAPSULE | ORAL | 11 refills | Status: DC

## 2023-10-14 MED ORDER — PREDNISONE 5 MG PO TABS
15.0000 mg | ORAL_TABLET | Freq: Every day | ORAL | 11 refills | Status: DC
  Filled 2023-10-14: qty 90, 30d supply, fill #0

## 2023-10-18 ENCOUNTER — Other Ambulatory Visit (HOSPITAL_COMMUNITY): Payer: Self-pay

## 2023-10-18 DIAGNOSIS — G90523 Complex regional pain syndrome I of lower limb, bilateral: Secondary | ICD-10-CM | POA: Diagnosis not present

## 2023-10-18 DIAGNOSIS — M5416 Radiculopathy, lumbar region: Secondary | ICD-10-CM | POA: Diagnosis not present

## 2023-10-18 DIAGNOSIS — M8580 Other specified disorders of bone density and structure, unspecified site: Secondary | ICD-10-CM | POA: Diagnosis not present

## 2023-10-18 DIAGNOSIS — Z944 Liver transplant status: Secondary | ICD-10-CM | POA: Diagnosis not present

## 2023-10-18 MED ORDER — OXYCODONE HCL 5 MG PO TABS
5.0000 mg | ORAL_TABLET | Freq: Three times a day (TID) | ORAL | 0 refills | Status: DC | PRN
  Filled 2023-10-18: qty 15, 5d supply, fill #0

## 2023-10-19 ENCOUNTER — Other Ambulatory Visit (HOSPITAL_COMMUNITY): Payer: Self-pay

## 2023-10-19 ENCOUNTER — Encounter (HOSPITAL_COMMUNITY): Payer: Self-pay

## 2023-10-19 DIAGNOSIS — Z1159 Encounter for screening for other viral diseases: Secondary | ICD-10-CM | POA: Diagnosis not present

## 2023-10-19 DIAGNOSIS — B259 Cytomegaloviral disease, unspecified: Secondary | ICD-10-CM | POA: Diagnosis not present

## 2023-10-19 DIAGNOSIS — T8641 Liver transplant rejection: Secondary | ICD-10-CM | POA: Diagnosis not present

## 2023-10-19 DIAGNOSIS — D84821 Immunodeficiency due to drugs: Secondary | ICD-10-CM | POA: Diagnosis not present

## 2023-10-20 ENCOUNTER — Other Ambulatory Visit (HOSPITAL_COMMUNITY): Payer: Self-pay

## 2023-10-21 ENCOUNTER — Other Ambulatory Visit (HOSPITAL_COMMUNITY): Payer: Self-pay

## 2023-10-21 MED ORDER — PREDNISONE 5 MG PO TABS
12.5000 mg | ORAL_TABLET | Freq: Every day | ORAL | 11 refills | Status: DC
  Filled 2023-10-21: qty 75, 30d supply, fill #0

## 2023-10-26 DIAGNOSIS — D849 Immunodeficiency, unspecified: Secondary | ICD-10-CM | POA: Diagnosis not present

## 2023-10-26 DIAGNOSIS — Z944 Liver transplant status: Secondary | ICD-10-CM | POA: Diagnosis not present

## 2023-10-26 DIAGNOSIS — Z79621 Long term (current) use of calcineurin inhibitor: Secondary | ICD-10-CM | POA: Diagnosis not present

## 2023-10-26 DIAGNOSIS — Z79624 Long term (current) use of inhibitors of nucleotide synthesis: Secondary | ICD-10-CM | POA: Diagnosis not present

## 2023-10-26 DIAGNOSIS — T8641 Liver transplant rejection: Secondary | ICD-10-CM | POA: Diagnosis not present

## 2023-10-26 DIAGNOSIS — R739 Hyperglycemia, unspecified: Secondary | ICD-10-CM | POA: Diagnosis not present

## 2023-10-26 DIAGNOSIS — B259 Cytomegaloviral disease, unspecified: Secondary | ICD-10-CM | POA: Diagnosis not present

## 2023-10-26 DIAGNOSIS — R0609 Other forms of dyspnea: Secondary | ICD-10-CM | POA: Diagnosis not present

## 2023-10-26 DIAGNOSIS — Z7982 Long term (current) use of aspirin: Secondary | ICD-10-CM | POA: Diagnosis not present

## 2023-10-26 DIAGNOSIS — D84821 Immunodeficiency due to drugs: Secondary | ICD-10-CM | POA: Diagnosis not present

## 2023-10-26 DIAGNOSIS — F1010 Alcohol abuse, uncomplicated: Secondary | ICD-10-CM | POA: Diagnosis not present

## 2023-10-27 ENCOUNTER — Other Ambulatory Visit (HOSPITAL_COMMUNITY): Payer: Self-pay

## 2023-10-27 ENCOUNTER — Other Ambulatory Visit: Payer: Self-pay

## 2023-10-27 DIAGNOSIS — M5451 Vertebrogenic low back pain: Secondary | ICD-10-CM | POA: Diagnosis not present

## 2023-10-27 DIAGNOSIS — M5416 Radiculopathy, lumbar region: Secondary | ICD-10-CM | POA: Diagnosis not present

## 2023-10-27 MED ORDER — SULFAMETHOXAZOLE-TRIMETHOPRIM 800-160 MG PO TABS
1.0000 | ORAL_TABLET | ORAL | 2 refills | Status: DC
  Filled 2023-10-27: qty 12, 28d supply, fill #0

## 2023-10-27 MED ORDER — PREDNISONE 5 MG PO TABS
10.0000 mg | ORAL_TABLET | Freq: Every day | ORAL | 11 refills | Status: DC
  Filled 2023-10-27: qty 60, 30d supply, fill #0

## 2023-10-27 MED ORDER — CLOTRIMAZOLE 10 MG MT TROC
10.0000 mg | Freq: Two times a day (BID) | OROMUCOSAL | 2 refills | Status: DC
  Filled 2023-10-27: qty 60, 30d supply, fill #0

## 2023-10-28 DIAGNOSIS — R0609 Other forms of dyspnea: Secondary | ICD-10-CM | POA: Diagnosis not present

## 2023-10-28 DIAGNOSIS — R0602 Shortness of breath: Secondary | ICD-10-CM | POA: Diagnosis not present

## 2023-10-29 DIAGNOSIS — M5416 Radiculopathy, lumbar region: Secondary | ICD-10-CM | POA: Diagnosis not present

## 2023-10-29 DIAGNOSIS — M5451 Vertebrogenic low back pain: Secondary | ICD-10-CM | POA: Diagnosis not present

## 2023-10-30 ENCOUNTER — Other Ambulatory Visit (HOSPITAL_COMMUNITY): Payer: Self-pay

## 2023-11-01 DIAGNOSIS — M5416 Radiculopathy, lumbar region: Secondary | ICD-10-CM | POA: Diagnosis not present

## 2023-11-01 DIAGNOSIS — M5451 Vertebrogenic low back pain: Secondary | ICD-10-CM | POA: Diagnosis not present

## 2023-11-02 ENCOUNTER — Other Ambulatory Visit (HOSPITAL_COMMUNITY): Payer: Self-pay

## 2023-11-03 DIAGNOSIS — Z944 Liver transplant status: Secondary | ICD-10-CM | POA: Diagnosis not present

## 2023-11-03 DIAGNOSIS — B259 Cytomegaloviral disease, unspecified: Secondary | ICD-10-CM | POA: Diagnosis not present

## 2023-11-03 DIAGNOSIS — D849 Immunodeficiency, unspecified: Secondary | ICD-10-CM | POA: Diagnosis not present

## 2023-11-03 DIAGNOSIS — T8641 Liver transplant rejection: Secondary | ICD-10-CM | POA: Diagnosis not present

## 2023-11-05 ENCOUNTER — Other Ambulatory Visit (HOSPITAL_COMMUNITY): Payer: Self-pay

## 2023-11-05 DIAGNOSIS — M5451 Vertebrogenic low back pain: Secondary | ICD-10-CM | POA: Diagnosis not present

## 2023-11-05 DIAGNOSIS — M5416 Radiculopathy, lumbar region: Secondary | ICD-10-CM | POA: Diagnosis not present

## 2023-11-05 MED ORDER — OXYCODONE HCL 5 MG PO TABS
5.0000 mg | ORAL_TABLET | Freq: Three times a day (TID) | ORAL | 0 refills | Status: DC | PRN
  Filled 2023-11-05: qty 15, 5d supply, fill #0

## 2023-11-05 MED ORDER — PREDNISONE 5 MG PO TABS
7.5000 mg | ORAL_TABLET | Freq: Every day | ORAL | 11 refills | Status: DC
  Filled 2023-11-05: qty 45, 30d supply, fill #0

## 2023-11-08 ENCOUNTER — Ambulatory Visit: Payer: 59

## 2023-11-08 DIAGNOSIS — M5416 Radiculopathy, lumbar region: Secondary | ICD-10-CM | POA: Diagnosis not present

## 2023-11-08 DIAGNOSIS — M5451 Vertebrogenic low back pain: Secondary | ICD-10-CM | POA: Diagnosis not present

## 2023-11-09 DIAGNOSIS — R0609 Other forms of dyspnea: Secondary | ICD-10-CM | POA: Diagnosis not present

## 2023-11-10 ENCOUNTER — Other Ambulatory Visit (HOSPITAL_COMMUNITY): Payer: Self-pay

## 2023-11-10 ENCOUNTER — Other Ambulatory Visit: Payer: Self-pay

## 2023-11-10 MED ORDER — PREDNISONE 5 MG PO TABS
5.0000 mg | ORAL_TABLET | Freq: Every day | ORAL | 11 refills | Status: DC
  Filled 2023-11-10: qty 30, 30d supply, fill #0

## 2023-11-11 DIAGNOSIS — T380X5A Adverse effect of glucocorticoids and synthetic analogues, initial encounter: Secondary | ICD-10-CM | POA: Diagnosis not present

## 2023-11-11 DIAGNOSIS — R739 Hyperglycemia, unspecified: Secondary | ICD-10-CM | POA: Diagnosis not present

## 2023-11-12 ENCOUNTER — Other Ambulatory Visit (HOSPITAL_COMMUNITY): Payer: Self-pay

## 2023-11-12 MED ORDER — INSULIN LISPRO (1 UNIT DIAL) 100 UNIT/ML (KWIKPEN)
30.0000 [IU] | PEN_INJECTOR | Freq: Every day | SUBCUTANEOUS | 1 refills | Status: DC
  Filled 2023-11-12: qty 9, 30d supply, fill #0

## 2023-11-15 DIAGNOSIS — M5416 Radiculopathy, lumbar region: Secondary | ICD-10-CM | POA: Diagnosis not present

## 2023-11-15 DIAGNOSIS — M5451 Vertebrogenic low back pain: Secondary | ICD-10-CM | POA: Diagnosis not present

## 2023-11-16 DIAGNOSIS — D84821 Immunodeficiency due to drugs: Secondary | ICD-10-CM | POA: Diagnosis not present

## 2023-11-16 DIAGNOSIS — T8641 Liver transplant rejection: Secondary | ICD-10-CM | POA: Diagnosis not present

## 2023-11-16 DIAGNOSIS — B259 Cytomegaloviral disease, unspecified: Secondary | ICD-10-CM | POA: Diagnosis not present

## 2023-11-17 ENCOUNTER — Other Ambulatory Visit (HOSPITAL_COMMUNITY): Payer: Self-pay

## 2023-11-17 MED ORDER — TACROLIMUS 1 MG PO CAPS
ORAL_CAPSULE | ORAL | 11 refills | Status: DC
  Filled 2023-11-17: qty 150, 30d supply, fill #0
  Filled 2023-12-31: qty 150, 30d supply, fill #1

## 2023-11-19 ENCOUNTER — Other Ambulatory Visit (HOSPITAL_COMMUNITY): Payer: Self-pay

## 2023-11-19 ENCOUNTER — Other Ambulatory Visit: Payer: 59

## 2023-11-22 ENCOUNTER — Other Ambulatory Visit (HOSPITAL_COMMUNITY): Payer: Self-pay

## 2023-11-22 DIAGNOSIS — M5459 Other low back pain: Secondary | ICD-10-CM | POA: Diagnosis not present

## 2023-11-22 DIAGNOSIS — M5416 Radiculopathy, lumbar region: Secondary | ICD-10-CM | POA: Diagnosis not present

## 2023-11-22 DIAGNOSIS — G90523 Complex regional pain syndrome I of lower limb, bilateral: Secondary | ICD-10-CM | POA: Diagnosis not present

## 2023-11-22 DIAGNOSIS — M549 Dorsalgia, unspecified: Secondary | ICD-10-CM | POA: Diagnosis not present

## 2023-11-22 MED ORDER — OXYCODONE HCL 5 MG PO TABS
5.0000 mg | ORAL_TABLET | Freq: Two times a day (BID) | ORAL | 0 refills | Status: DC | PRN
  Filled 2023-11-22: qty 30, 15d supply, fill #0

## 2023-11-23 DIAGNOSIS — Z944 Liver transplant status: Secondary | ICD-10-CM | POA: Diagnosis not present

## 2023-11-23 DIAGNOSIS — D849 Immunodeficiency, unspecified: Secondary | ICD-10-CM | POA: Diagnosis not present

## 2023-11-23 DIAGNOSIS — B259 Cytomegaloviral disease, unspecified: Secondary | ICD-10-CM | POA: Diagnosis not present

## 2023-11-24 DIAGNOSIS — M5451 Vertebrogenic low back pain: Secondary | ICD-10-CM | POA: Diagnosis not present

## 2023-11-24 DIAGNOSIS — M5416 Radiculopathy, lumbar region: Secondary | ICD-10-CM | POA: Diagnosis not present

## 2023-11-25 DIAGNOSIS — L821 Other seborrheic keratosis: Secondary | ICD-10-CM | POA: Diagnosis not present

## 2023-11-25 DIAGNOSIS — L814 Other melanin hyperpigmentation: Secondary | ICD-10-CM | POA: Diagnosis not present

## 2023-11-25 DIAGNOSIS — D2260 Melanocytic nevi of unspecified upper limb, including shoulder: Secondary | ICD-10-CM | POA: Diagnosis not present

## 2023-11-25 DIAGNOSIS — D239 Other benign neoplasm of skin, unspecified: Secondary | ICD-10-CM | POA: Diagnosis not present

## 2023-11-25 DIAGNOSIS — L820 Inflamed seborrheic keratosis: Secondary | ICD-10-CM | POA: Diagnosis not present

## 2023-11-25 DIAGNOSIS — D2270 Melanocytic nevi of unspecified lower limb, including hip: Secondary | ICD-10-CM | POA: Diagnosis not present

## 2023-11-25 DIAGNOSIS — D84822 Immunodeficiency due to external causes: Secondary | ICD-10-CM | POA: Diagnosis not present

## 2023-11-25 DIAGNOSIS — D225 Melanocytic nevi of trunk: Secondary | ICD-10-CM | POA: Diagnosis not present

## 2023-11-25 DIAGNOSIS — L570 Actinic keratosis: Secondary | ICD-10-CM | POA: Diagnosis not present

## 2023-11-26 ENCOUNTER — Other Ambulatory Visit (HOSPITAL_COMMUNITY): Payer: Self-pay

## 2023-11-26 MED ORDER — PREDNISONE 5 MG PO TABS
5.0000 mg | ORAL_TABLET | Freq: Every day | ORAL | 11 refills | Status: DC
  Filled 2023-11-26 – 2023-12-04 (×2): qty 30, 30d supply, fill #0

## 2023-11-29 ENCOUNTER — Other Ambulatory Visit (HOSPITAL_COMMUNITY): Payer: Self-pay

## 2023-11-29 MED ORDER — PREDNISONE 5 MG PO TABS
2.5000 mg | ORAL_TABLET | Freq: Every day | ORAL | 11 refills | Status: DC
  Filled 2023-11-29: qty 15, 30d supply, fill #0

## 2023-11-30 ENCOUNTER — Other Ambulatory Visit (HOSPITAL_COMMUNITY): Payer: Self-pay

## 2023-12-01 ENCOUNTER — Other Ambulatory Visit (HOSPITAL_COMMUNITY): Payer: Self-pay

## 2023-12-02 DIAGNOSIS — M2550 Pain in unspecified joint: Secondary | ICD-10-CM | POA: Diagnosis not present

## 2023-12-03 ENCOUNTER — Other Ambulatory Visit (HOSPITAL_COMMUNITY): Payer: Self-pay

## 2023-12-03 DIAGNOSIS — B259 Cytomegaloviral disease, unspecified: Secondary | ICD-10-CM | POA: Diagnosis not present

## 2023-12-03 DIAGNOSIS — T8641 Liver transplant rejection: Secondary | ICD-10-CM | POA: Diagnosis not present

## 2023-12-03 DIAGNOSIS — D849 Immunodeficiency, unspecified: Secondary | ICD-10-CM | POA: Diagnosis not present

## 2023-12-04 ENCOUNTER — Other Ambulatory Visit (HOSPITAL_COMMUNITY): Payer: Self-pay

## 2023-12-07 ENCOUNTER — Other Ambulatory Visit (HOSPITAL_COMMUNITY): Payer: Self-pay

## 2023-12-07 MED ORDER — MYCOPHENOLATE MOFETIL 250 MG PO CAPS
1000.0000 mg | ORAL_CAPSULE | Freq: Two times a day (BID) | ORAL | 11 refills | Status: DC
Start: 2023-12-07 — End: 2024-02-09
  Filled 2023-12-07 – 2023-12-31 (×2): qty 240, 30d supply, fill #0
  Filled 2024-01-26: qty 240, 30d supply, fill #1

## 2023-12-08 DIAGNOSIS — T8641 Liver transplant rejection: Secondary | ICD-10-CM | POA: Diagnosis not present

## 2023-12-08 DIAGNOSIS — M79641 Pain in right hand: Secondary | ICD-10-CM | POA: Diagnosis not present

## 2023-12-08 DIAGNOSIS — D849 Immunodeficiency, unspecified: Secondary | ICD-10-CM | POA: Diagnosis not present

## 2023-12-08 DIAGNOSIS — B259 Cytomegaloviral disease, unspecified: Secondary | ICD-10-CM | POA: Diagnosis not present

## 2023-12-08 DIAGNOSIS — M7989 Other specified soft tissue disorders: Secondary | ICD-10-CM | POA: Diagnosis not present

## 2023-12-08 DIAGNOSIS — M79642 Pain in left hand: Secondary | ICD-10-CM | POA: Diagnosis not present

## 2023-12-08 DIAGNOSIS — D84821 Immunodeficiency due to drugs: Secondary | ICD-10-CM | POA: Diagnosis not present

## 2023-12-08 DIAGNOSIS — Z79624 Long term (current) use of inhibitors of nucleotide synthesis: Secondary | ICD-10-CM | POA: Diagnosis not present

## 2023-12-13 ENCOUNTER — Encounter: Payer: Self-pay | Admitting: Family Medicine

## 2023-12-13 ENCOUNTER — Other Ambulatory Visit (HOSPITAL_COMMUNITY): Payer: Self-pay

## 2023-12-13 MED ORDER — OXYCODONE HCL 5 MG PO TABS
5.0000 mg | ORAL_TABLET | Freq: Two times a day (BID) | ORAL | 0 refills | Status: DC | PRN
Start: 2023-12-13 — End: 2023-12-31
  Filled 2023-12-13: qty 30, 15d supply, fill #0

## 2023-12-14 DIAGNOSIS — T8641 Liver transplant rejection: Secondary | ICD-10-CM | POA: Diagnosis not present

## 2023-12-14 DIAGNOSIS — M791 Myalgia, unspecified site: Secondary | ICD-10-CM | POA: Diagnosis not present

## 2023-12-14 DIAGNOSIS — D849 Immunodeficiency, unspecified: Secondary | ICD-10-CM | POA: Diagnosis not present

## 2023-12-14 DIAGNOSIS — B259 Cytomegaloviral disease, unspecified: Secondary | ICD-10-CM | POA: Diagnosis not present

## 2023-12-15 ENCOUNTER — Other Ambulatory Visit: Payer: Self-pay

## 2023-12-15 ENCOUNTER — Other Ambulatory Visit (HOSPITAL_COMMUNITY): Payer: Self-pay

## 2023-12-15 DIAGNOSIS — M5416 Radiculopathy, lumbar region: Secondary | ICD-10-CM | POA: Diagnosis not present

## 2023-12-15 MED ORDER — HYDROCORTISONE 5 MG PO TABS
ORAL_TABLET | Freq: Two times a day (BID) | ORAL | 1 refills | Status: DC
  Filled 2023-12-15: qty 320, 80d supply, fill #0

## 2023-12-22 DIAGNOSIS — M5459 Other low back pain: Secondary | ICD-10-CM | POA: Diagnosis not present

## 2023-12-22 DIAGNOSIS — M5416 Radiculopathy, lumbar region: Secondary | ICD-10-CM | POA: Diagnosis not present

## 2023-12-28 DIAGNOSIS — R000 Tachycardia, unspecified: Secondary | ICD-10-CM | POA: Diagnosis not present

## 2023-12-28 DIAGNOSIS — D849 Immunodeficiency, unspecified: Secondary | ICD-10-CM | POA: Diagnosis not present

## 2023-12-28 DIAGNOSIS — Z1159 Encounter for screening for other viral diseases: Secondary | ICD-10-CM | POA: Diagnosis not present

## 2023-12-28 DIAGNOSIS — Z944 Liver transplant status: Secondary | ICD-10-CM | POA: Diagnosis not present

## 2023-12-28 DIAGNOSIS — B259 Cytomegaloviral disease, unspecified: Secondary | ICD-10-CM | POA: Diagnosis not present

## 2023-12-31 ENCOUNTER — Other Ambulatory Visit: Payer: Self-pay

## 2023-12-31 ENCOUNTER — Other Ambulatory Visit (HOSPITAL_COMMUNITY): Payer: Self-pay

## 2023-12-31 MED ORDER — OXYCODONE HCL 5 MG PO TABS
5.0000 mg | ORAL_TABLET | Freq: Two times a day (BID) | ORAL | 0 refills | Status: DC | PRN
  Filled 2023-12-31: qty 30, 15d supply, fill #0

## 2024-01-04 ENCOUNTER — Encounter: Payer: Self-pay | Admitting: Family Medicine

## 2024-01-04 NOTE — Telephone Encounter (Signed)
 It that something you can do electronically?   Thanks

## 2024-01-05 ENCOUNTER — Other Ambulatory Visit (HOSPITAL_COMMUNITY): Payer: Self-pay

## 2024-01-05 DIAGNOSIS — D849 Immunodeficiency, unspecified: Secondary | ICD-10-CM | POA: Diagnosis not present

## 2024-01-05 DIAGNOSIS — B259 Cytomegaloviral disease, unspecified: Secondary | ICD-10-CM | POA: Diagnosis not present

## 2024-01-05 DIAGNOSIS — M4316 Spondylolisthesis, lumbar region: Secondary | ICD-10-CM | POA: Diagnosis not present

## 2024-01-05 DIAGNOSIS — M545 Low back pain, unspecified: Secondary | ICD-10-CM | POA: Diagnosis not present

## 2024-01-05 DIAGNOSIS — Z944 Liver transplant status: Secondary | ICD-10-CM | POA: Diagnosis not present

## 2024-01-05 DIAGNOSIS — M47816 Spondylosis without myelopathy or radiculopathy, lumbar region: Secondary | ICD-10-CM | POA: Diagnosis not present

## 2024-01-05 DIAGNOSIS — M48061 Spinal stenosis, lumbar region without neurogenic claudication: Secondary | ICD-10-CM | POA: Diagnosis not present

## 2024-01-05 DIAGNOSIS — M48062 Spinal stenosis, lumbar region with neurogenic claudication: Secondary | ICD-10-CM | POA: Diagnosis not present

## 2024-01-05 MED ORDER — OXYCODONE HCL 5 MG PO TABS
5.0000 mg | ORAL_TABLET | Freq: Three times a day (TID) | ORAL | 0 refills | Status: DC | PRN
  Filled 2024-01-05 – 2024-01-13 (×3): qty 20, 7d supply, fill #0

## 2024-01-05 MED ORDER — TIZANIDINE HCL 4 MG PO TABS
4.0000 mg | ORAL_TABLET | Freq: Three times a day (TID) | ORAL | 2 refills | Status: DC
  Filled 2024-01-05: qty 90, 30d supply, fill #0
  Filled 2024-02-07: qty 90, 30d supply, fill #1
  Filled 2024-03-16: qty 90, 30d supply, fill #2

## 2024-01-05 MED ORDER — PREGABALIN 75 MG PO CAPS
75.0000 mg | ORAL_CAPSULE | Freq: Two times a day (BID) | ORAL | 2 refills | Status: DC
  Filled 2024-01-05: qty 60, 30d supply, fill #0
  Filled 2024-02-07: qty 60, 30d supply, fill #1

## 2024-01-12 ENCOUNTER — Other Ambulatory Visit (HOSPITAL_COMMUNITY): Payer: Self-pay

## 2024-01-13 ENCOUNTER — Other Ambulatory Visit (HOSPITAL_COMMUNITY): Payer: Self-pay

## 2024-01-14 ENCOUNTER — Other Ambulatory Visit (HOSPITAL_COMMUNITY): Payer: Self-pay

## 2024-01-18 ENCOUNTER — Other Ambulatory Visit (HOSPITAL_COMMUNITY): Payer: Self-pay

## 2024-01-18 MED ORDER — DIAZEPAM 5 MG PO TABS
5.0000 mg | ORAL_TABLET | Freq: Every day | ORAL | 0 refills | Status: DC
  Filled 2024-01-18: qty 1, 1d supply, fill #0

## 2024-01-19 DIAGNOSIS — Z944 Liver transplant status: Secondary | ICD-10-CM | POA: Diagnosis not present

## 2024-01-19 DIAGNOSIS — B259 Cytomegaloviral disease, unspecified: Secondary | ICD-10-CM | POA: Diagnosis not present

## 2024-01-19 DIAGNOSIS — D849 Immunodeficiency, unspecified: Secondary | ICD-10-CM | POA: Diagnosis not present

## 2024-01-21 ENCOUNTER — Other Ambulatory Visit: Payer: Self-pay

## 2024-01-21 ENCOUNTER — Other Ambulatory Visit (HOSPITAL_COMMUNITY): Payer: Self-pay

## 2024-01-21 MED ORDER — TACROLIMUS 1 MG PO CAPS
3.0000 mg | ORAL_CAPSULE | Freq: Two times a day (BID) | ORAL | 11 refills | Status: DC
  Filled 2024-01-21: qty 180, 30d supply, fill #0

## 2024-01-22 ENCOUNTER — Other Ambulatory Visit (HOSPITAL_COMMUNITY): Payer: Self-pay

## 2024-01-26 DIAGNOSIS — D84821 Immunodeficiency due to drugs: Secondary | ICD-10-CM | POA: Diagnosis not present

## 2024-01-26 DIAGNOSIS — Z79899 Other long term (current) drug therapy: Secondary | ICD-10-CM | POA: Diagnosis not present

## 2024-01-26 DIAGNOSIS — E0965 Drug or chemical induced diabetes mellitus with hyperglycemia: Secondary | ICD-10-CM | POA: Diagnosis not present

## 2024-01-26 DIAGNOSIS — B259 Cytomegaloviral disease, unspecified: Secondary | ICD-10-CM | POA: Diagnosis not present

## 2024-01-26 DIAGNOSIS — Z794 Long term (current) use of insulin: Secondary | ICD-10-CM | POA: Diagnosis not present

## 2024-01-26 DIAGNOSIS — M791 Myalgia, unspecified site: Secondary | ICD-10-CM | POA: Diagnosis not present

## 2024-01-26 DIAGNOSIS — T8641 Liver transplant rejection: Secondary | ICD-10-CM | POA: Diagnosis not present

## 2024-01-27 ENCOUNTER — Other Ambulatory Visit (HOSPITAL_COMMUNITY): Payer: Self-pay

## 2024-01-27 DIAGNOSIS — M4316 Spondylolisthesis, lumbar region: Secondary | ICD-10-CM | POA: Diagnosis not present

## 2024-01-27 DIAGNOSIS — M48062 Spinal stenosis, lumbar region with neurogenic claudication: Secondary | ICD-10-CM | POA: Diagnosis not present

## 2024-01-27 DIAGNOSIS — M545 Low back pain, unspecified: Secondary | ICD-10-CM | POA: Diagnosis not present

## 2024-01-27 MED ORDER — MYCOPHENOLATE MOFETIL 250 MG PO CAPS
1500.0000 mg | ORAL_CAPSULE | Freq: Two times a day (BID) | ORAL | 11 refills | Status: DC
  Filled 2024-01-27 – 2024-02-29 (×5): qty 360, 30d supply, fill #0
  Filled 2024-03-31: qty 360, 30d supply, fill #1
  Filled 2024-04-26: qty 360, 30d supply, fill #2
  Filled 2024-05-25 (×2): qty 360, 30d supply, fill #3
  Filled 2024-06-25: qty 360, 30d supply, fill #4

## 2024-01-28 DIAGNOSIS — K7581 Nonalcoholic steatohepatitis (NASH): Secondary | ICD-10-CM | POA: Diagnosis not present

## 2024-01-28 DIAGNOSIS — K838 Other specified diseases of biliary tract: Secondary | ICD-10-CM | POA: Diagnosis not present

## 2024-01-28 DIAGNOSIS — T8649 Other complications of liver transplant: Secondary | ICD-10-CM | POA: Diagnosis not present

## 2024-01-28 DIAGNOSIS — R7989 Other specified abnormal findings of blood chemistry: Secondary | ICD-10-CM | POA: Diagnosis not present

## 2024-01-29 DIAGNOSIS — E2740 Unspecified adrenocortical insufficiency: Secondary | ICD-10-CM | POA: Diagnosis not present

## 2024-01-29 DIAGNOSIS — F064 Anxiety disorder due to known physiological condition: Secondary | ICD-10-CM | POA: Diagnosis not present

## 2024-01-29 DIAGNOSIS — Z944 Liver transplant status: Secondary | ICD-10-CM | POA: Diagnosis not present

## 2024-01-29 DIAGNOSIS — R739 Hyperglycemia, unspecified: Secondary | ICD-10-CM | POA: Diagnosis not present

## 2024-01-29 DIAGNOSIS — D61818 Other pancytopenia: Secondary | ICD-10-CM | POA: Diagnosis not present

## 2024-01-29 DIAGNOSIS — G8929 Other chronic pain: Secondary | ICD-10-CM | POA: Diagnosis not present

## 2024-01-29 DIAGNOSIS — M545 Low back pain, unspecified: Secondary | ICD-10-CM | POA: Diagnosis not present

## 2024-01-29 DIAGNOSIS — T8641 Liver transplant rejection: Secondary | ICD-10-CM | POA: Diagnosis not present

## 2024-01-29 DIAGNOSIS — D849 Immunodeficiency, unspecified: Secondary | ICD-10-CM | POA: Diagnosis not present

## 2024-01-29 DIAGNOSIS — E0965 Drug or chemical induced diabetes mellitus with hyperglycemia: Secondary | ICD-10-CM | POA: Diagnosis not present

## 2024-01-29 DIAGNOSIS — F410 Panic disorder [episodic paroxysmal anxiety] without agoraphobia: Secondary | ICD-10-CM | POA: Diagnosis not present

## 2024-01-29 DIAGNOSIS — E119 Type 2 diabetes mellitus without complications: Secondary | ICD-10-CM | POA: Diagnosis not present

## 2024-01-29 DIAGNOSIS — M549 Dorsalgia, unspecified: Secondary | ICD-10-CM | POA: Diagnosis not present

## 2024-01-29 DIAGNOSIS — T380X5A Adverse effect of glucocorticoids and synthetic analogues, initial encounter: Secondary | ICD-10-CM | POA: Diagnosis not present

## 2024-01-29 DIAGNOSIS — Z940 Kidney transplant status: Secondary | ICD-10-CM | POA: Diagnosis not present

## 2024-01-29 DIAGNOSIS — E273 Drug-induced adrenocortical insufficiency: Secondary | ICD-10-CM | POA: Diagnosis not present

## 2024-01-29 DIAGNOSIS — D84821 Immunodeficiency due to drugs: Secondary | ICD-10-CM | POA: Diagnosis not present

## 2024-01-29 DIAGNOSIS — Z794 Long term (current) use of insulin: Secondary | ICD-10-CM | POA: Diagnosis not present

## 2024-01-29 DIAGNOSIS — D500 Iron deficiency anemia secondary to blood loss (chronic): Secondary | ICD-10-CM | POA: Diagnosis not present

## 2024-01-29 DIAGNOSIS — B259 Cytomegaloviral disease, unspecified: Secondary | ICD-10-CM | POA: Diagnosis not present

## 2024-02-01 DIAGNOSIS — T8641 Liver transplant rejection: Secondary | ICD-10-CM | POA: Diagnosis not present

## 2024-02-01 DIAGNOSIS — R739 Hyperglycemia, unspecified: Secondary | ICD-10-CM | POA: Diagnosis not present

## 2024-02-01 DIAGNOSIS — D849 Immunodeficiency, unspecified: Secondary | ICD-10-CM | POA: Diagnosis not present

## 2024-02-03 DIAGNOSIS — T380X5A Adverse effect of glucocorticoids and synthetic analogues, initial encounter: Secondary | ICD-10-CM | POA: Diagnosis not present

## 2024-02-03 DIAGNOSIS — F410 Panic disorder [episodic paroxysmal anxiety] without agoraphobia: Secondary | ICD-10-CM | POA: Diagnosis not present

## 2024-02-03 DIAGNOSIS — Z944 Liver transplant status: Secondary | ICD-10-CM | POA: Diagnosis not present

## 2024-02-03 DIAGNOSIS — R739 Hyperglycemia, unspecified: Secondary | ICD-10-CM | POA: Diagnosis not present

## 2024-02-03 DIAGNOSIS — F064 Anxiety disorder due to known physiological condition: Secondary | ICD-10-CM | POA: Diagnosis not present

## 2024-02-04 DIAGNOSIS — R739 Hyperglycemia, unspecified: Secondary | ICD-10-CM | POA: Diagnosis not present

## 2024-02-04 DIAGNOSIS — T380X5A Adverse effect of glucocorticoids and synthetic analogues, initial encounter: Secondary | ICD-10-CM | POA: Diagnosis not present

## 2024-02-06 DIAGNOSIS — E273 Drug-induced adrenocortical insufficiency: Secondary | ICD-10-CM | POA: Diagnosis not present

## 2024-02-06 DIAGNOSIS — Z940 Kidney transplant status: Secondary | ICD-10-CM | POA: Diagnosis not present

## 2024-02-06 DIAGNOSIS — T380X5A Adverse effect of glucocorticoids and synthetic analogues, initial encounter: Secondary | ICD-10-CM | POA: Diagnosis not present

## 2024-02-06 DIAGNOSIS — R739 Hyperglycemia, unspecified: Secondary | ICD-10-CM | POA: Diagnosis not present

## 2024-02-07 ENCOUNTER — Other Ambulatory Visit (HOSPITAL_COMMUNITY): Payer: Self-pay

## 2024-02-07 ENCOUNTER — Other Ambulatory Visit: Payer: Self-pay

## 2024-02-07 MED ORDER — VALGANCICLOVIR HCL 450 MG PO TABS
900.0000 mg | ORAL_TABLET | Freq: Every day | ORAL | 2 refills | Status: DC
  Filled 2024-02-07: qty 60, 30d supply, fill #0
  Filled 2024-03-16: qty 60, 30d supply, fill #1
  Filled 2024-04-09: qty 60, 30d supply, fill #2

## 2024-02-07 MED ORDER — HYDROCORTISONE 5 MG PO TABS
ORAL_TABLET | ORAL | 0 refills | Status: DC
Start: 2024-02-06 — End: 2024-02-09

## 2024-02-07 MED ORDER — SULFAMETHOXAZOLE-TRIMETHOPRIM 800-160 MG PO TABS
1.0000 | ORAL_TABLET | Freq: Every day | ORAL | 2 refills | Status: DC
  Filled 2024-02-07: qty 12, 28d supply, fill #0
  Filled 2024-03-16: qty 12, 28d supply, fill #1
  Filled 2024-04-09: qty 12, 28d supply, fill #2

## 2024-02-07 MED ORDER — CLOTRIMAZOLE 10 MG MT TROC
10.0000 mg | Freq: Two times a day (BID) | OROMUCOSAL | 2 refills | Status: DC
  Filled 2024-02-07: qty 60, 30d supply, fill #0
  Filled 2024-03-16: qty 60, 30d supply, fill #1
  Filled 2024-04-10: qty 60, 30d supply, fill #2

## 2024-02-07 MED ORDER — PREDNISONE 20 MG PO TABS
60.0000 mg | ORAL_TABLET | Freq: Every day | ORAL | 2 refills | Status: DC
  Filled 2024-02-07: qty 90, 30d supply, fill #0

## 2024-02-08 DIAGNOSIS — D849 Immunodeficiency, unspecified: Secondary | ICD-10-CM | POA: Diagnosis not present

## 2024-02-08 DIAGNOSIS — B259 Cytomegaloviral disease, unspecified: Secondary | ICD-10-CM | POA: Diagnosis not present

## 2024-02-08 DIAGNOSIS — D649 Anemia, unspecified: Secondary | ICD-10-CM | POA: Diagnosis not present

## 2024-02-08 DIAGNOSIS — Z944 Liver transplant status: Secondary | ICD-10-CM | POA: Diagnosis not present

## 2024-02-09 ENCOUNTER — Encounter: Payer: Self-pay | Admitting: Family Medicine

## 2024-02-09 ENCOUNTER — Other Ambulatory Visit (HOSPITAL_COMMUNITY): Payer: Self-pay

## 2024-02-09 ENCOUNTER — Ambulatory Visit (INDEPENDENT_AMBULATORY_CARE_PROVIDER_SITE_OTHER): Admitting: Family Medicine

## 2024-02-09 VITALS — BP 134/68 | HR 80 | Temp 98.4°F | Ht 62.5 in | Wt 192.6 lb

## 2024-02-09 DIAGNOSIS — Z7952 Long term (current) use of systemic steroids: Secondary | ICD-10-CM | POA: Diagnosis not present

## 2024-02-09 DIAGNOSIS — T8641 Liver transplant rejection: Secondary | ICD-10-CM | POA: Diagnosis not present

## 2024-02-09 DIAGNOSIS — T380X5A Adverse effect of glucocorticoids and synthetic analogues, initial encounter: Secondary | ICD-10-CM

## 2024-02-09 DIAGNOSIS — F413 Other mixed anxiety disorders: Secondary | ICD-10-CM

## 2024-02-09 DIAGNOSIS — E099 Drug or chemical induced diabetes mellitus without complications: Secondary | ICD-10-CM

## 2024-02-09 MED ORDER — LORAZEPAM 1 MG PO TABS
1.0000 mg | ORAL_TABLET | Freq: Two times a day (BID) | ORAL | 0 refills | Status: DC | PRN
  Filled 2024-02-09: qty 30, 15d supply, fill #0

## 2024-02-09 NOTE — Assessment & Plan Note (Signed)
 60 mg  For organ rejection  On insulin  for DM with this  More anxiety  Hopes to wean over next 3 months

## 2024-02-09 NOTE — Assessment & Plan Note (Signed)
 Ups and downs Worse with recent hosp for liver transplant rejection/on rejection drugs and high dose prednisone  Needs coverage for panic moments and occational sleeplessness Ativan  1 mg works Aware of risks of habit and also sedation-this is for short term use and pt thinks she will not need often  Overall doing better now that she is home Reviewed stressors/ coping techniques/symptoms/ support sources/ tx options and side effects in detail today Declines further ssre  Declines more counseling

## 2024-02-09 NOTE — Assessment & Plan Note (Signed)
 Under care of endocrinology On insulin   Watching diet

## 2024-02-09 NOTE — Assessment & Plan Note (Signed)
 Recent hospitalization at [facility]  Reviewed hospital records, lab results and studies in detail    Cellcept  Prednisone     Bactrim  Valcyte    Being careful with immunomod status

## 2024-02-09 NOTE — Patient Instructions (Signed)
 I sent in the ativan  If you want to start back on ssri let us  know   If you want counseling let us  know  Take care of yourself

## 2024-02-09 NOTE — Progress Notes (Signed)
 Subjective:    Patient ID: Jamie Coleman, female    DOB: 19-Mar-1971, 53 y.o.   MRN: [id number]  HPI  Wt Readings from Last 3 Encounters:  02/09/24 192 lb 9.6 oz (87.4 kg)  09/22/23 201 lb (91.2 kg)  07/28/23 188 lb (85.3 kg)   34.67 kg/m  Vitals:   02/09/24 1206  BP: 134/68  Pulse: 80  Temp: 98.4 F (36.9 C)  SpO2: 98%    Pt presents for follow up of chronic medical problems / after recent hosp for liver transplant rejection Also mood   Hosp at [facility] this month for rejection of liver transplant  Treated with solumedrol pulse for 5 days, sirolimus  Also CMV viremia in past= her prophylaxis was re started with valganciclovir   Also bactrim  MWF  Prednisone  40- was increase to 60 mg daily and LFTs were down trending Hosp from 4/16 to 4/27  C and T cells are increasing but not back to normal    Endocrinology follows DM from steroids   Chronic back pain = had intra spinal steroid inj 3 d prior to admid Zanaflex  tid prn/ tylenosl/ pregabalin  / (had been taking oxycodone  for months) - was severe  Injection made a huge difference -thrilled with this   Adrenal insuff- was on hydrocortisone    Anxiety   Needs to get ativan  0.5 mg  back on her list for anxiety   Stiff and sore in the ams getting out of bed   Did have psychology consult in the hospital with transplant group Recommended ativan  1 mg for once daily  Has situational anxiety  Worsened by steroids  Also claustrophobia when in the hospital   Since getting out of hospital -anxiety is improved Needs ativan  on med list in case she needs to go back in    May need medicine prn -but not often   More hyperactive from the prednisone  than usual  Sleep is worse   Has pregabalin  to use  Not taking it but may start back  Helps back / also helps never pain in lips  It gives her night mares and sometimes feels overly stimulated   Tizanadine helps with am stiffness  (is allowed to take 3 tylenol  per day also  with caution)    Does not want to go on SSRI due to stomach upset  Thinks she does not need it       Patient Active Problem List   Diagnosis Date Noted   Liver transplant rejection (HCC) 02/09/2024   Low back pain 09/22/2023   Thrush 09/22/2023   Osteopenia 09/19/2023   On prednisone  therapy 04/29/2023   Abdominal weakness 04/29/2023   Multiple joint pain 02/15/2023   Menopause 02/15/2023   H/O liver transplant (HCC) 02/15/2023   Estrogen deficiency 09/24/2022   Acute on chronic alcoholic liver disease (HCC) 09/22/2022   Decompensation of cirrhosis of liver (HCC) 09/22/2022   History of alcoholic hepatitis 09/22/2022   Cirrhosis, alcoholic (HCC) 09/02/2022   Macrocytosis without anemia 09/02/2022   Acute liver failure without hepatic coma 09/01/2022   Bronchitis due to COVID-19 virus 05/05/2022   Elevated liver transaminase level 09/03/2019   Subclinical hypothyroidism 09/03/2019   Vitamin D  deficiency 03/02/2018   Fatigue 03/02/2018   Pupil asymmetry 08/04/2016   Hemorrhoids 12/28/2014   Steroid-induced diabetes mellitus (HCC) 08/28/2014   Tremor 06/30/2013   Sebaceous cyst 02/15/2013   OTHER&UNSPECIFIED DISEASES THE ORAL SOFT TISSUES 07/25/2008   DERMATOFIBROMA, ARM 11/11/2007   NEOPLASM, SKIN, UNCERTAIN BEHAVIOR 02/11/2007  Hyperlipidemia 02/10/2007   Anxiety disorder 02/10/2007   Allergic rhinitis 02/10/2007   GERD 02/10/2007   IBS 02/10/2007   Past Medical History:  Diagnosis Date   Allergy    allergic rhinitis   Anxiety    situational   COVID 05/2021   Difficult intubation    states 2012 ablation surgery, had diff intubation,glide scope used, not noted in EPIC anesthesia record   Elevated hemoglobin A1c 08/08/2015   Elevated liver function tests 08/08/2015   GERD (gastroesophageal reflux disease)    Hyperlipidemia    Low serum vitamin D  08/08/2015   Status post endometrial ablation    Tennis elbow    currently in PT-10/16   Past Surgical  History:  Procedure Laterality Date   BIOPSY  02/09/2022   Procedure: BIOPSY;  Surgeon: Normie Becton., MD;  Location: [facility];  Service: Gastroenterology;;   CESAREAN SECTION     COLONOSCOPY WITH PROPOFOL  N/A 02/09/2022   Procedure: COLONOSCOPY WITH PROPOFOL ;  Surgeon: Normie Becton., MD;  Location: [facility];  Service: Gastroenterology;  Laterality: N/A;   ENDOMETRIAL ABLATION     8/12   ESOPHAGOGASTRODUODENOSCOPY (EGD) WITH PROPOFOL  N/A 02/09/2022   Procedure: ESOPHAGOGASTRODUODENOSCOPY (EGD) WITH PROPOFOL ;  Surgeon: Brice Campi Albino Alu., MD;  Location: [facility];  Service: Gastroenterology;  Laterality: N/A;   EXTREMITY WIRE/PIN REMOVAL Left 07/16/2016   Procedure: PIN REMOVAL OF LEFT WRIST X 2;  Surgeon: Ronn Cohn, MD;  Location: [facility];  Service: Orthopedics;  Laterality: Left;   FOOT SURGERY Left    LAPAROSCOPIC TUBAL LIGATION  06/09/2011   Procedure: LAPAROSCOPIC TUBAL LIGATION;  Surgeon: Lawton Price;  Location: [facility];  Service: Gynecology;  Laterality: N/A;   LIGAMENT REPAIR Left 05/08/2016   Procedure: LEFT WRIST SCAPHOLUNATE LIGAMENT RECONSTRUCTION WITH TENDON GRAFT PIN NEURECTOMY AND REPAIR;  Surgeon: Ronn Cohn, MD;  Location: [facility];  Service: Orthopedics;  Laterality: Left;   LIVER TRANSPLANT  09/2022   POLYPECTOMY  02/09/2022   Procedure: POLYPECTOMY;  Surgeon: Mansouraty, Albino Alu., MD;  Location: [facility];  Service: Gastroenterology;;   TONSILLECTOMY     as child   Social History   Tobacco Use   Smoking status: Former    Current packs/day: 0.00    Types: Cigarettes    Quit date: 07/25/1983    Years since quitting: 40.5   Smokeless tobacco: Never   Tobacco comments:    in high school  Vaping Use   Vaping status: Never Used  Substance Use Topics   Alcohol  use: Not Currently    Alcohol /week: 14.0 standard drinks of alcohol     Types: 14 Glasses of wine per week    Comment: 6  shots daily 12 shots daily on weekends   Drug use: No   Family History  Problem Relation Age of Onset   Cancer Mother        [place] insitu of appendix   Heart disease Father        CAD   Diabetes Father        type II   Alzheimer's disease Father    Colon polyps Father    Diabetes Brother        type II   Diabetes Maternal Grandfather    Diabetes Paternal Grandmother    Diabetes Paternal Grandfather    Breast cancer Neg Hx    Colon cancer Neg Hx    Stomach cancer Neg Hx    Esophageal cancer Neg Hx    Pancreatic  cancer Neg Hx    Allergies  Allergen Reactions   Lipitor [Atorvastatin ] Other (See Comments)    myalgias   Current Outpatient Medications on File Prior to Visit  Medication Sig Dispense Refill   aspirin 81 MG chewable tablet Chew by mouth daily.     Calcium  Citrate-Vitamin D  315-5 MG-MCG TABS Take by mouth.     clotrimazole  (MYCELEX ) 10 MG troche Take 1 tablet (10 mg total) by mouth 2 (two) times daily. Dissolve tablet(troche) slowly and completely in mouth. 60 tablet 2   fexofenadine (ALLEGRA) 180 MG tablet Take 180 mg by mouth daily.     omeprazole (PRILOSEC) 20 MG capsule Take 20 mg by mouth daily.     Polyvinyl Alcohol -Povidone (REFRESH OP) Place 1-2 drops into both eyes 4 (four) times daily as needed (dry eye).     predniSONE  (DELTASONE ) 20 MG tablet Take 3 tablets (60 mg total) by mouth daily. 90 tablet 2   pregabalin  (LYRICA ) 75 MG capsule Take 1 capsule (75 mg total) by mouth 2 (two) times daily. 60 capsule 2   sulfamethoxazole -trimethoprim  (BACTRIM  DS) 800-160 MG tablet Take 1 tablet by mouth every Monday, Wednesday, and Friday 12 tablet 2   tiZANidine  (ZANAFLEX ) 4 MG tablet Take 1 tablet (4 mg total) by mouth 3 (three) times daily. 90 tablet 2   valGANciclovir  (VALCYTE ) 450 MG tablet Take 2 tablets (900 mg total) by mouth daily with supper. 60 tablet 2   Insulin  Pen Needle 31G X 5 MM MISC Use as directed (Patient not taking: Reported on 02/09/2024) 100 each 3    mycophenolate  (CELLCEPT ) 250 MG capsule Take 6 capsules (1,500 mg total) by mouth every 12 (twelve) hours. 360 capsule 11   oxyCODONE  (OXY IR/ROXICODONE ) 5 MG immediate release tablet Take 1 tablet (5 mg total) by mouth 3 (three) times daily as needed for pain (Patient not taking: Reported on 02/09/2024) 20 tablet 0   [DISCONTINUED] gabapentin  (NEURONTIN ) 100 MG capsule Take 1 capsule (100 mg total) by mouth 3 (three) times daily. (Patient not taking: Reported on 02/09/2024) 90 capsule 1   [DISCONTINUED] insulin  lispro (HUMALOG ) 100 UNIT/ML KwikPen Please see diabetes discharge instructions. Max daily dose 30 units. 9 mL 1   No current facility-administered medications on file prior to visit.    Review of Systems  Constitutional:  Negative for activity change, appetite change, fatigue, fever and unexpected weight change.  HENT:  Negative for congestion, ear pain, rhinorrhea, sinus pressure and sore throat.   Eyes:  Negative for pain, redness and visual disturbance.  Respiratory:  Negative for cough, shortness of breath and wheezing.   Cardiovascular:  Negative for chest pain and palpitations.  Gastrointestinal:  Negative for abdominal pain, blood in stool, constipation and diarrhea.  Endocrine: Negative for polydipsia and polyuria.  Genitourinary:  Negative for dysuria, frequency and urgency.  Musculoskeletal:  Negative for arthralgias, back pain and myalgias.  Skin:  Negative for pallor and rash.  Allergic/Immunologic: Negative for environmental allergies.  Neurological:  Negative for dizziness, syncope and headaches.  Hematological:  Negative for adenopathy. Does not bruise/bleed easily.  Psychiatric/Behavioral:  Negative for decreased concentration and dysphoric mood. The patient is nervous/anxious.        Was sad to have to leave her job        Objective:   Physical Exam Constitutional:      General: She is not in acute distress.    Appearance: Normal appearance. She is  well-developed. She is obese. She is not ill-appearing  or diaphoretic.     Comments: Puffy from prednisone    HENT:     Head: Normocephalic and atraumatic.  Eyes:     Conjunctiva/sclera: Conjunctivae normal.     Pupils: Pupils are equal, round, and reactive to light.  Neck:     Thyroid : No thyromegaly.     Vascular: No carotid bruit or JVD.  Cardiovascular:     Rate and Rhythm: Normal rate and regular rhythm.     Heart sounds: Normal heart sounds.     No gallop.  Pulmonary:     Effort: Pulmonary effort is normal. No respiratory distress.     Breath sounds: Normal breath sounds. No wheezing or rales.  Abdominal:     General: There is no distension or abdominal bruit.     Palpations: Abdomen is soft.  Musculoskeletal:     Cervical back: Normal range of motion and neck supple.     Right lower leg: No edema.     Left lower leg: No edema.  Lymphadenopathy:     Cervical: No cervical adenopathy.  Skin:    General: Skin is warm and dry.     Coloration: Skin is not pale.     Findings: No rash.  Neurological:     Mental Status: She is alert.     Coordination: Coordination normal.     Deep Tendon Reflexes: Reflexes are normal and symmetric. Reflexes normal.  Psychiatric:        Attention and Perception: Attention normal.        Mood and Affect: Mood normal.     Comments: Tearful when discussing leaving job Mildly anxious  Pleasant  Candidly discusses symptoms and stressors             Assessment & Plan:   Problem List Items Addressed This Visit       Digestive   Liver transplant rejection [facility])   Recent hospitalization at [facility]  Reviewed hospital records, lab results and studies in detail    Cellcept  Prednisone     Bactrim  Valcyte    Being careful with immunomod status         Endocrine   Steroid-induced diabetes mellitus (HCC) - Primary   Under care of endocrinology On insulin   Watching diet         Other   On prednisone  therapy   60 mg  For organ  rejection  On insulin  for DM with this  More anxiety  Hopes to wean over next 3 months       Anxiety disorder   Ups and downs Worse with recent hosp for liver transplant rejection/on rejection drugs and high dose prednisone  Needs coverage for panic moments and occational sleeplessness Ativan  1 mg works Aware of risks of habit and also sedation-this is for short term use and pt thinks she will not need often  Overall doing better now that she is home Reviewed stressors/ coping techniques/symptoms/ support sources/ tx options and side effects in detail today Declines further ssre  Declines more counseling      Relevant Medications   LORazepam  (ATIVAN ) 1 MG tablet

## 2024-02-10 ENCOUNTER — Other Ambulatory Visit (HOSPITAL_COMMUNITY): Payer: Self-pay

## 2024-02-10 MED ORDER — SIROLIMUS 1 MG PO TABS
3.0000 mg | ORAL_TABLET | Freq: Every day | ORAL | 0 refills | Status: DC
  Filled 2024-02-10 – 2024-02-19 (×2): qty 90, 30d supply, fill #0

## 2024-02-10 MED ORDER — PREDNISONE 10 MG PO TABS
55.0000 mg | ORAL_TABLET | Freq: Every day | ORAL | 11 refills | Status: DC
  Filled 2024-02-10 – 2024-02-11 (×3): qty 165, 30d supply, fill #0

## 2024-02-11 ENCOUNTER — Other Ambulatory Visit (HOSPITAL_BASED_OUTPATIENT_CLINIC_OR_DEPARTMENT_OTHER): Payer: Self-pay

## 2024-02-11 ENCOUNTER — Other Ambulatory Visit (HOSPITAL_COMMUNITY): Payer: Self-pay

## 2024-02-15 DIAGNOSIS — Z944 Liver transplant status: Secondary | ICD-10-CM | POA: Diagnosis not present

## 2024-02-15 DIAGNOSIS — B259 Cytomegaloviral disease, unspecified: Secondary | ICD-10-CM | POA: Diagnosis not present

## 2024-02-15 DIAGNOSIS — D849 Immunodeficiency, unspecified: Secondary | ICD-10-CM | POA: Diagnosis not present

## 2024-02-16 ENCOUNTER — Other Ambulatory Visit (HOSPITAL_COMMUNITY): Payer: Self-pay

## 2024-02-16 MED ORDER — PREDNISONE 10 MG PO TABS
50.0000 mg | ORAL_TABLET | Freq: Every day | ORAL | 11 refills | Status: DC
  Filled 2024-02-16: qty 165, 30d supply, fill #0

## 2024-02-16 MED ORDER — TACROLIMUS 1 MG PO CAPS
2.0000 mg | ORAL_CAPSULE | Freq: Two times a day (BID) | ORAL | 11 refills | Status: DC
  Filled 2024-02-16: qty 240, 60d supply, fill #0

## 2024-02-17 ENCOUNTER — Other Ambulatory Visit (HOSPITAL_COMMUNITY): Payer: Self-pay

## 2024-02-17 DIAGNOSIS — E119 Type 2 diabetes mellitus without complications: Secondary | ICD-10-CM | POA: Diagnosis not present

## 2024-02-17 DIAGNOSIS — H25813 Combined forms of age-related cataract, bilateral: Secondary | ICD-10-CM | POA: Diagnosis not present

## 2024-02-17 DIAGNOSIS — D3132 Benign neoplasm of left choroid: Secondary | ICD-10-CM | POA: Diagnosis not present

## 2024-02-17 DIAGNOSIS — H524 Presbyopia: Secondary | ICD-10-CM | POA: Diagnosis not present

## 2024-02-17 DIAGNOSIS — G902 Horner's syndrome: Secondary | ICD-10-CM | POA: Diagnosis not present

## 2024-02-17 LAB — HM DIABETES EYE EXAM

## 2024-02-18 DIAGNOSIS — Z944 Liver transplant status: Secondary | ICD-10-CM | POA: Diagnosis not present

## 2024-02-18 DIAGNOSIS — D849 Immunodeficiency, unspecified: Secondary | ICD-10-CM | POA: Diagnosis not present

## 2024-02-18 DIAGNOSIS — B259 Cytomegaloviral disease, unspecified: Secondary | ICD-10-CM | POA: Diagnosis not present

## 2024-02-19 ENCOUNTER — Other Ambulatory Visit (HOSPITAL_COMMUNITY): Payer: Self-pay

## 2024-02-21 ENCOUNTER — Other Ambulatory Visit (HOSPITAL_COMMUNITY): Payer: Self-pay

## 2024-02-21 DIAGNOSIS — D84821 Immunodeficiency due to drugs: Secondary | ICD-10-CM | POA: Diagnosis not present

## 2024-02-21 DIAGNOSIS — T8691 Unspecified transplanted organ and tissue rejection: Secondary | ICD-10-CM | POA: Diagnosis not present

## 2024-02-21 DIAGNOSIS — B259 Cytomegaloviral disease, unspecified: Secondary | ICD-10-CM | POA: Diagnosis not present

## 2024-02-21 DIAGNOSIS — D708 Other neutropenia: Secondary | ICD-10-CM | POA: Diagnosis not present

## 2024-02-21 DIAGNOSIS — Z944 Liver transplant status: Secondary | ICD-10-CM | POA: Diagnosis not present

## 2024-02-21 DIAGNOSIS — F1010 Alcohol abuse, uncomplicated: Secondary | ICD-10-CM | POA: Diagnosis not present

## 2024-02-21 DIAGNOSIS — K7030 Alcoholic cirrhosis of liver without ascites: Secondary | ICD-10-CM | POA: Diagnosis not present

## 2024-02-21 DIAGNOSIS — D849 Immunodeficiency, unspecified: Secondary | ICD-10-CM | POA: Diagnosis not present

## 2024-02-21 DIAGNOSIS — F419 Anxiety disorder, unspecified: Secondary | ICD-10-CM | POA: Diagnosis not present

## 2024-02-21 DIAGNOSIS — R739 Hyperglycemia, unspecified: Secondary | ICD-10-CM | POA: Diagnosis not present

## 2024-02-21 DIAGNOSIS — T380X5A Adverse effect of glucocorticoids and synthetic analogues, initial encounter: Secondary | ICD-10-CM | POA: Diagnosis not present

## 2024-02-21 MED ORDER — TACROLIMUS 1 MG PO CAPS
ORAL_CAPSULE | ORAL | 11 refills | Status: AC
Start: 2024-02-21 — End: ?
  Filled 2024-02-21 – 2024-03-16 (×3): qty 150, 30d supply, fill #0

## 2024-02-21 MED ORDER — PREDNISONE 10 MG PO TABS
45.0000 mg | ORAL_TABLET | Freq: Every day | ORAL | 11 refills | Status: DC
  Filled 2024-02-21: qty 165, 36d supply, fill #0

## 2024-02-21 MED ORDER — SIROLIMUS 0.5 MG PO TABS
2.5000 mg | ORAL_TABLET | Freq: Every day | ORAL | 3 refills | Status: DC
  Filled 2024-02-21 – 2024-03-16 (×3): qty 450, 90d supply, fill #0

## 2024-02-23 ENCOUNTER — Other Ambulatory Visit (HOSPITAL_COMMUNITY): Payer: Self-pay

## 2024-02-24 ENCOUNTER — Other Ambulatory Visit (HOSPITAL_COMMUNITY): Payer: Self-pay

## 2024-02-24 DIAGNOSIS — D849 Immunodeficiency, unspecified: Secondary | ICD-10-CM | POA: Diagnosis not present

## 2024-02-24 DIAGNOSIS — Z944 Liver transplant status: Secondary | ICD-10-CM | POA: Diagnosis not present

## 2024-02-25 ENCOUNTER — Other Ambulatory Visit (HOSPITAL_COMMUNITY): Payer: Self-pay

## 2024-02-25 MED ORDER — PRECISION QID TEST VI STRP
ORAL_STRIP | 1 refills | Status: AC
Start: 2024-02-25 — End: ?
  Filled 2024-02-25: qty 100, 30d supply, fill #0

## 2024-02-25 MED ORDER — FREESTYLE LIBRE 2 PLUS SENSOR MISC
3 refills | Status: DC
  Filled 2024-02-25: qty 6, 84d supply, fill #0
  Filled 2024-02-25: qty 1, 14d supply, fill #0
  Filled 2024-05-01 – 2024-05-02 (×2): qty 6, 84d supply, fill #1

## 2024-02-25 MED ORDER — PREDNISONE 10 MG PO TABS
40.0000 mg | ORAL_TABLET | Freq: Every day | ORAL | 11 refills | Status: DC
  Filled 2024-02-25 – 2024-03-16 (×3): qty 120, 30d supply, fill #0

## 2024-02-25 MED ORDER — FREESTYLE LANCETS MISC
1 refills | Status: DC
  Filled 2024-02-25: qty 100, 30d supply, fill #0

## 2024-02-25 MED ORDER — INSULIN PEN NEEDLE 31G X 5 MM MISC
5 refills | Status: DC
  Filled 2024-02-25: qty 100, 90d supply, fill #0
  Filled 2024-05-11: qty 100, 90d supply, fill #1

## 2024-02-25 MED ORDER — OXYCODONE HCL 5 MG PO TABS
5.0000 mg | ORAL_TABLET | Freq: Four times a day (QID) | ORAL | 0 refills | Status: DC | PRN
Start: 2024-02-25 — End: 2024-03-08
  Filled 2024-02-25: qty 12, 3d supply, fill #0

## 2024-02-25 MED ORDER — INSULIN LISPRO (1 UNIT DIAL) 100 UNIT/ML (KWIKPEN)
PEN_INJECTOR | SUBCUTANEOUS | 5 refills | Status: DC
  Filled 2024-02-25 – 2024-03-08 (×2): qty 30, 28d supply, fill #0
  Filled 2024-04-26: qty 30, 28d supply, fill #1

## 2024-02-28 ENCOUNTER — Other Ambulatory Visit (HOSPITAL_COMMUNITY): Payer: Self-pay

## 2024-02-28 MED ORDER — MYCOPHENOLATE MOFETIL 250 MG PO CAPS
ORAL_CAPSULE | ORAL | 0 refills | Status: DC
  Filled 2024-02-28 – 2024-02-29 (×2): qty 360, 30d supply, fill #0

## 2024-02-29 ENCOUNTER — Other Ambulatory Visit (HOSPITAL_COMMUNITY): Payer: Self-pay

## 2024-03-01 DIAGNOSIS — Z944 Liver transplant status: Secondary | ICD-10-CM | POA: Diagnosis not present

## 2024-03-01 DIAGNOSIS — B259 Cytomegaloviral disease, unspecified: Secondary | ICD-10-CM | POA: Diagnosis not present

## 2024-03-01 DIAGNOSIS — D849 Immunodeficiency, unspecified: Secondary | ICD-10-CM | POA: Diagnosis not present

## 2024-03-07 ENCOUNTER — Other Ambulatory Visit: Payer: Self-pay

## 2024-03-07 ENCOUNTER — Other Ambulatory Visit (HOSPITAL_COMMUNITY): Payer: Self-pay

## 2024-03-07 MED ORDER — GRANIX 480 MCG/0.8ML ~~LOC~~ SOSY: PREFILLED_SYRINGE | SUBCUTANEOUS | 3 refills | Status: AC

## 2024-03-08 ENCOUNTER — Other Ambulatory Visit (HOSPITAL_COMMUNITY): Payer: Self-pay

## 2024-03-08 DIAGNOSIS — B259 Cytomegaloviral disease, unspecified: Secondary | ICD-10-CM | POA: Diagnosis not present

## 2024-03-08 DIAGNOSIS — D84821 Immunodeficiency due to drugs: Secondary | ICD-10-CM | POA: Diagnosis not present

## 2024-03-08 DIAGNOSIS — M48062 Spinal stenosis, lumbar region with neurogenic claudication: Secondary | ICD-10-CM | POA: Diagnosis not present

## 2024-03-08 DIAGNOSIS — R5383 Other fatigue: Secondary | ICD-10-CM | POA: Diagnosis not present

## 2024-03-08 DIAGNOSIS — Z79624 Long term (current) use of inhibitors of nucleotide synthesis: Secondary | ICD-10-CM | POA: Diagnosis not present

## 2024-03-08 DIAGNOSIS — Z944 Liver transplant status: Secondary | ICD-10-CM | POA: Diagnosis not present

## 2024-03-08 DIAGNOSIS — M5416 Radiculopathy, lumbar region: Secondary | ICD-10-CM | POA: Diagnosis not present

## 2024-03-08 DIAGNOSIS — D849 Immunodeficiency, unspecified: Secondary | ICD-10-CM | POA: Diagnosis not present

## 2024-03-08 DIAGNOSIS — M545 Low back pain, unspecified: Secondary | ICD-10-CM | POA: Diagnosis not present

## 2024-03-08 DIAGNOSIS — R1013 Epigastric pain: Secondary | ICD-10-CM | POA: Diagnosis not present

## 2024-03-08 DIAGNOSIS — R7989 Other specified abnormal findings of blood chemistry: Secondary | ICD-10-CM | POA: Diagnosis not present

## 2024-03-08 DIAGNOSIS — G8929 Other chronic pain: Secondary | ICD-10-CM | POA: Diagnosis not present

## 2024-03-08 DIAGNOSIS — R630 Anorexia: Secondary | ICD-10-CM | POA: Diagnosis not present

## 2024-03-08 MED ORDER — GABAPENTIN 300 MG PO CAPS
300.0000 mg | ORAL_CAPSULE | Freq: Three times a day (TID) | ORAL | 11 refills | Status: AC
  Filled 2024-03-08: qty 90, 30d supply, fill #0
  Filled 2024-04-09: qty 90, 30d supply, fill #1
  Filled 2024-05-11: qty 90, 30d supply, fill #2
  Filled 2024-06-08: qty 90, 30d supply, fill #3
  Filled 2024-07-07: qty 90, 30d supply, fill #4
  Filled 2024-08-10: qty 90, 30d supply, fill #5
  Filled 2024-09-19: qty 90, 30d supply, fill #6
  Filled 2024-10-27: qty 90, 30d supply, fill #7

## 2024-03-08 MED ORDER — OXYCODONE HCL 5 MG PO TABS
5.0000 mg | ORAL_TABLET | ORAL | 0 refills | Status: DC | PRN
  Filled 2024-03-08: qty 25, 5d supply, fill #0

## 2024-03-09 ENCOUNTER — Other Ambulatory Visit: Payer: Self-pay

## 2024-03-09 DIAGNOSIS — D849 Immunodeficiency, unspecified: Secondary | ICD-10-CM | POA: Diagnosis not present

## 2024-03-09 DIAGNOSIS — B259 Cytomegaloviral disease, unspecified: Secondary | ICD-10-CM | POA: Diagnosis not present

## 2024-03-09 DIAGNOSIS — Z944 Liver transplant status: Secondary | ICD-10-CM | POA: Diagnosis not present

## 2024-03-09 DIAGNOSIS — R739 Hyperglycemia, unspecified: Secondary | ICD-10-CM | POA: Diagnosis not present

## 2024-03-09 DIAGNOSIS — T380X5A Adverse effect of glucocorticoids and synthetic analogues, initial encounter: Secondary | ICD-10-CM | POA: Diagnosis not present

## 2024-03-09 LAB — CBC AND DIFFERENTIAL: Hemoglobin: 11.7 — AB (ref 12.0–16.0)

## 2024-03-09 LAB — HEPATIC FUNCTION PANEL
ALT: 61 U/L — AB (ref 7–35)
AST: 33 (ref 13–35)

## 2024-03-09 LAB — BASIC METABOLIC PANEL WITH GFR: Creatinine: 1.2 — AB (ref 0.5–1.1)

## 2024-03-09 LAB — COMPREHENSIVE METABOLIC PANEL WITH GFR: eGFR: 54

## 2024-03-13 ENCOUNTER — Other Ambulatory Visit (HOSPITAL_BASED_OUTPATIENT_CLINIC_OR_DEPARTMENT_OTHER): Payer: Self-pay

## 2024-03-13 MED ORDER — PREDNISONE 10 MG PO TABS
30.0000 mg | ORAL_TABLET | Freq: Every day | ORAL | 11 refills | Status: DC
  Filled 2024-03-13 – 2024-03-16 (×2): qty 90, 30d supply, fill #0

## 2024-03-14 ENCOUNTER — Encounter: Payer: Self-pay | Admitting: Family Medicine

## 2024-03-14 ENCOUNTER — Other Ambulatory Visit: Payer: Self-pay | Admitting: Pharmacy Technician

## 2024-03-14 ENCOUNTER — Other Ambulatory Visit (HOSPITAL_COMMUNITY): Payer: Self-pay

## 2024-03-14 ENCOUNTER — Other Ambulatory Visit: Payer: Self-pay

## 2024-03-14 ENCOUNTER — Ambulatory Visit (INDEPENDENT_AMBULATORY_CARE_PROVIDER_SITE_OTHER): Admitting: Family Medicine

## 2024-03-14 ENCOUNTER — Other Ambulatory Visit (HOSPITAL_BASED_OUTPATIENT_CLINIC_OR_DEPARTMENT_OTHER): Payer: Self-pay

## 2024-03-14 VITALS — BP 143/82 | HR 101 | Temp 98.6°F | Ht 62.5 in

## 2024-03-14 DIAGNOSIS — R030 Elevated blood-pressure reading, without diagnosis of hypertension: Secondary | ICD-10-CM | POA: Diagnosis not present

## 2024-03-14 DIAGNOSIS — T8641 Liver transplant rejection: Secondary | ICD-10-CM | POA: Diagnosis not present

## 2024-03-14 MED ORDER — NIVESTYM 480 MCG/0.8ML IJ SOSY: PREFILLED_SYRINGE | INTRAMUSCULAR | 3 refills | Status: DC

## 2024-03-14 MED ORDER — DIAZEPAM 5 MG PO TABS
ORAL_TABLET | ORAL | 0 refills | Status: DC
  Filled 2024-03-14: qty 1, 1d supply, fill #0

## 2024-03-14 NOTE — Patient Instructions (Addendum)
 Blood pressure is better on 2nd check  Let's give this a few days and see if things improve off the last anti rejection medicine  Coming down on prednisone  should help also   Reach out to us  on Thursday or Friday (or earlier if blood pressure goes up more)    If blood pressure is still high - ask your transplant doctor about amlodipine or chlorthalidone  As options

## 2024-03-14 NOTE — Progress Notes (Signed)
 Subjective:    Patient ID: Jamie Coleman, female    DOB: 13-Dec-1970, 53 y.o.   MRN: [id number]  HPI  Wt Readings from Last 3 Encounters:  02/09/24 192 lb 9.6 oz (87.4 kg)  09/22/23 201 lb (91.2 kg)  07/28/23 188 lb (85.3 kg)   34.67 kg/m  Vitals:   03/14/24 1520 03/14/24 1541  BP: (!) 150/86 (!) 143/82  Pulse: (!) 101   Temp: 98.6 F (37 C)   SpO2: 97%     Pt presents with c/o  Elevated blood pressure (in setting of chronic prednisone )   BP Readings from Last 3 Encounters:  03/14/24 (!) 143/82  02/09/24 134/68  09/22/23 135/70   Pulse Readings from Last 3 Encounters:  03/14/24 (!) 101  02/09/24 80  09/22/23 (!) 102     Takes prednisone  for liver transplant rejection  Sees endocrinology for insulin  for this  Blood pressure at that office on 5/29 was 155/90   One of her anti rejection meds was stopped today (seralimus ?)  This has caused increase HR   Neupogen shot 2 weeks in a row     Blood pressure was very low at [facility] last wk at ID clinic They were worried (systolic was 80s)  She was light headed that day   Blood pressure at endocrinology was 170 systolic yesterday  It is really up and down   140s-160s this week  This afternoon at home 130s   Yesterday prednisone  went down from 35 to 30     Last A1c 7.2 on 03/09/24 Cr was 1.2 GFR 54  ALT 61 AST 33 Bili 0.7 normal  Protein 6.1 low  Alk phos normal at 57  Plt 111 Hb 11.7  Watching ANC at ID office - last 1.7?    Was told to stay away from [facility] and arb and ace      Patient Active Problem List   Diagnosis Date Noted   Elevated blood pressure reading 03/14/2024   Liver transplant rejection (HCC) 02/09/2024   Low back pain 09/22/2023   Thrush 09/22/2023   Osteopenia 09/19/2023   On prednisone  therapy 04/29/2023   Abdominal weakness 04/29/2023   Multiple joint pain 02/15/2023   Menopause 02/15/2023   H/O liver transplant (HCC) 02/15/2023   Estrogen deficiency 09/24/2022    Acute on chronic alcoholic liver disease (HCC) 09/22/2022   Decompensation of cirrhosis of liver (HCC) 09/22/2022   History of alcoholic hepatitis 09/22/2022   Cirrhosis, alcoholic (HCC) 09/02/2022   Macrocytosis without anemia 09/02/2022   Acute liver failure without hepatic coma 09/01/2022   Bronchitis due to COVID-19 virus 05/05/2022   Elevated liver transaminase level 09/03/2019   Subclinical hypothyroidism 09/03/2019   Vitamin D  deficiency 03/02/2018   Fatigue 03/02/2018   Pupil asymmetry 08/04/2016   Hemorrhoids 12/28/2014   Steroid-induced diabetes mellitus (HCC) 08/28/2014   Tremor 06/30/2013   Sebaceous cyst 02/15/2013   OTHER&UNSPECIFIED DISEASES THE ORAL SOFT TISSUES 07/25/2008   DERMATOFIBROMA, ARM 11/11/2007   NEOPLASM, SKIN, UNCERTAIN BEHAVIOR 02/11/2007   Hyperlipidemia 02/10/2007   Anxiety disorder 02/10/2007   Allergic rhinitis 02/10/2007   GERD 02/10/2007   IBS 02/10/2007   Past Medical History:  Diagnosis Date   Allergy    allergic rhinitis   Anxiety    situational   COVID 05/2021   Difficult intubation    states 2012 ablation surgery, had diff intubation,glide scope used, not noted in EPIC anesthesia record   Elevated hemoglobin A1c 08/08/2015   Elevated liver  function tests 08/08/2015   GERD (gastroesophageal reflux disease)    Hyperlipidemia    Low serum vitamin D  08/08/2015   Status post endometrial ablation    Tennis elbow    currently in PT-10/16   Past Surgical History:  Procedure Laterality Date   BIOPSY  02/09/2022   Procedure: BIOPSY;  Surgeon: Brice Campi Albino Alu., MD;  Location: [facility];  Service: Gastroenterology;;   CESAREAN SECTION     COLONOSCOPY WITH PROPOFOL  N/A 02/09/2022   Procedure: COLONOSCOPY WITH PROPOFOL ;  Surgeon: Normie Becton., MD;  Location: [facility];  Service: Gastroenterology;  Laterality: N/A;   ENDOMETRIAL ABLATION     8/12   ESOPHAGOGASTRODUODENOSCOPY (EGD) WITH PROPOFOL  N/A 02/09/2022    Procedure: ESOPHAGOGASTRODUODENOSCOPY (EGD) WITH PROPOFOL ;  Surgeon: Brice Campi Albino Alu., MD;  Location: [facility];  Service: Gastroenterology;  Laterality: N/A;   EXTREMITY WIRE/PIN REMOVAL Left 07/16/2016   Procedure: PIN REMOVAL OF LEFT WRIST X 2;  Surgeon: Ronn Cohn, MD;  Location: [facility];  Service: Orthopedics;  Laterality: Left;   FOOT SURGERY Left    LAPAROSCOPIC TUBAL LIGATION  06/09/2011   Procedure: LAPAROSCOPIC TUBAL LIGATION;  Surgeon: Lawton Price;  Location: [facility];  Service: Gynecology;  Laterality: N/A;   LIGAMENT REPAIR Left 05/08/2016   Procedure: LEFT WRIST SCAPHOLUNATE LIGAMENT RECONSTRUCTION WITH TENDON GRAFT PIN NEURECTOMY AND REPAIR;  Surgeon: Ronn Cohn, MD;  Location: [facility];  Service: Orthopedics;  Laterality: Left;   LIVER TRANSPLANT  09/2022   POLYPECTOMY  02/09/2022   Procedure: POLYPECTOMY;  Surgeon: Mansouraty, Albino Alu., MD;  Location: [facility];  Service: Gastroenterology;;   TONSILLECTOMY     as child   Social History   Tobacco Use   Smoking status: Former    Current packs/day: 0.00    Types: Cigarettes    Quit date: 07/25/1983    Years since quitting: 40.6   Smokeless tobacco: Never   Tobacco comments:    in high school  Vaping Use   Vaping status: Never Used  Substance Use Topics   Alcohol  use: Not Currently    Alcohol /week: 14.0 standard drinks of alcohol     Types: 14 Glasses of wine per week    Comment: 6 shots daily 12 shots daily on weekends   Drug use: No   Family History  Problem Relation Age of Onset   Cancer Mother        CA insitu of appendix   Heart disease Father        CAD   Diabetes Father        type II   Alzheimer's disease Father    Colon polyps Father    Diabetes Brother        type II   Diabetes Maternal Grandfather    Diabetes Paternal Grandmother    Diabetes Paternal Grandfather    Breast cancer Neg Hx    Colon cancer Neg Hx    Stomach cancer Neg Hx     Esophageal cancer Neg Hx    Pancreatic cancer Neg Hx    Allergies  Allergen Reactions   Lipitor [Atorvastatin ] Other (See Comments)    myalgias   Current Outpatient Medications on File Prior to Visit  Medication Sig Dispense Refill   aspirin 81 MG chewable tablet Chew by mouth daily.     Calcium  Citrate-Vitamin D  315-5 MG-MCG TABS Take by mouth.     clotrimazole  (MYCELEX ) 10 MG troche Take 1 tablet (10 mg total) by mouth 2 (two)  times daily. Dissolve tablet(troche) slowly and completely in mouth. 60 tablet 2   Continuous Glucose Sensor (FREESTYLE LIBRE 2 PLUS SENSOR) MISC Use 1 each as directed 6 each 3   fexofenadine (ALLEGRA) 180 MG tablet Take 180 mg by mouth daily.     gabapentin  (NEURONTIN ) 300 MG capsule Take 1 capsule (300 mg total) by mouth 3 (three) times daily. 90 capsule 11   glucose blood (PRECISION QID TEST) test strip Use 1 strip to test 3 (three) times daily 100 each 1   insulin  lispro (HUMALOG ) 100 UNIT/ML KwikPen Inject 28 units with breakfast, 28 units with lunch, and 22 units with dinner in addition to sliding scale.  Max daily dose 110 units. 36 mL 5   Insulin  Pen Needle 31G X 5 MM MISC Use as directed 100 each 5   Lancets (FREESTYLE) lancets use as directed three times daily. 100 each 1   LORazepam  (ATIVAN ) 1 MG tablet Take 1 tablet (1 mg total) by mouth 2 (two) times daily as needed for anxiety. Caution of sedation 30 tablet 0   mycophenolate  (CELLCEPT ) 250 MG capsule Take 6 capsules (1,500 mg total) by mouth every 12 (twelve) hours. 360 capsule 11   omeprazole (PRILOSEC) 20 MG capsule Take 20 mg by mouth daily.     oxyCODONE  (OXY IR/ROXICODONE ) 5 MG immediate release tablet Take 1 tablet (5 mg total) by mouth every 4 (four) hours as needed for pain for up to 5 days. 25 tablet 0   Polyvinyl Alcohol -Povidone (REFRESH OP) Place 1-2 drops into both eyes 4 (four) times daily as needed (dry eye).     predniSONE  (DELTASONE ) 10 MG tablet Take 3 tablets (30 mg total) by  mouth daily. 90 tablet 11   sulfamethoxazole -trimethoprim  (BACTRIM  DS) 800-160 MG tablet Take 1 tablet by mouth every Monday, Wednesday, and Friday 12 tablet 2   tacrolimus  (PROGRAF ) 1 MG capsule Take 2 capsules (2 mg total) by mouth every morning AND 3 capsules (3 mg total) at bedtime. 150 capsule 11   Tbo-Filgrastim  (GRANIX ) 480 MCG/0.8ML SOSY injection Inject 0.73 mLs (438 mcg total) subcutaneously once daily 0.8 mL 3   tiZANidine  (ZANAFLEX ) 4 MG tablet Take 1 tablet (4 mg total) by mouth 3 (three) times daily. 90 tablet 2   valGANciclovir  (VALCYTE ) 450 MG tablet Take 2 tablets (900 mg total) by mouth daily with supper. 60 tablet 2   Insulin  Pen Needle 31G X 5 MM MISC Use as directed (Patient not taking: Reported on 03/14/2024) 100 each 3   No current facility-administered medications on file prior to visit.    Review of Systems  Constitutional:  Positive for fatigue. Negative for activity change, appetite change, fever and unexpected weight change.  HENT:  Negative for congestion, ear pain, rhinorrhea, sinus pressure and sore throat.        Left ear feels full   Eyes:  Negative for pain, redness and visual disturbance.  Respiratory:  Negative for cough, shortness of breath and wheezing.   Cardiovascular:  Negative for chest pain and palpitations.  Gastrointestinal:  Negative for abdominal pain, blood in stool, constipation and diarrhea.  Endocrine: Negative for polydipsia and polyuria.  Genitourinary:  Negative for dysuria, frequency and urgency.  Musculoskeletal:  Negative for arthralgias, back pain and myalgias.  Skin:  Negative for pallor and rash.  Allergic/Immunologic: Negative for environmental allergies.  Neurological:  Negative for dizziness, syncope and headaches.  Hematological:  Negative for adenopathy. Does not bruise/bleed easily.  Psychiatric/Behavioral:  Negative for decreased  concentration and dysphoric mood. The patient is nervous/anxious.        Objective:    Physical Exam Constitutional:      General: She is not in acute distress.    Appearance: Normal appearance. She is well-developed. She is obese. She is not ill-appearing or diaphoretic.  HENT:     Head: Normocephalic and atraumatic.     Mouth/Throat:     Mouth: Mucous membranes are moist.     Pharynx: Oropharynx is clear.  Eyes:     Conjunctiva/sclera: Conjunctivae normal.     Pupils: Pupils are equal, round, and reactive to light.  Neck:     Thyroid : No thyromegaly.     Vascular: No carotid bruit or JVD.  Cardiovascular:     Rate and Rhythm: Normal rate and regular rhythm.     Heart sounds: Normal heart sounds.     No gallop.  Pulmonary:     Effort: Pulmonary effort is normal. No respiratory distress.     Breath sounds: Normal breath sounds. No wheezing or rales.  Abdominal:     General: There is no distension or abdominal bruit.     Palpations: Abdomen is soft.  Musculoskeletal:     Cervical back: Normal range of motion and neck supple.     Right lower leg: No edema.     Left lower leg: No edema.  Lymphadenopathy:     Cervical: No cervical adenopathy.  Skin:    General: Skin is warm and dry.     Coloration: Skin is not pale.     Findings: No rash.  Neurological:     Mental Status: She is alert.     Coordination: Coordination normal.     Deep Tendon Reflexes: Reflexes are normal and symmetric. Reflexes normal.  Psychiatric:        Mood and Affect: Mood normal.           Assessment & Plan:   Problem List Items Addressed This Visit       Digestive   Liver transplant rejection (HCC)   In process of cutting back some medicines Reviewed last note and labs from [facility]   Blood pressure has been widely up and down  Cut prednisone  from 35 to 30 mg daily             Other   Elevated blood pressure reading - Primary   High in several offices/ better at home and low in one office (ID) In process of liver transplant rejection therapy including steroids  BP:  (!) 143/82  Improved with sitting   Hesitant to add medicine at this time until we get a clear recommendation from transplant team re: safe alternative She also stopped one rejection drug today that tends to increase blood pressure and pulse   Instructed to monitor at  home  Reach out in several days Watch sodium / fluid intake and exercise as tolerated    Last GFR was 54 on may 29

## 2024-03-14 NOTE — Assessment & Plan Note (Addendum)
 In process of cutting back some medicines Reviewed last note and labs from [facility]   Blood pressure has been widely up and down  Cut prednisone  from 35 to 30 mg daily

## 2024-03-14 NOTE — Assessment & Plan Note (Addendum)
 High in several offices/ better at home and low in one office (ID) In process of liver transplant rejection therapy including steroids  BP: (!) 143/82  Improved with sitting   Hesitant to add medicine at this time until we get a clear recommendation from transplant team re: safe alternative She also stopped one rejection drug today that tends to increase blood pressure and pulse   Instructed to monitor at  home  Reach out in several days Watch sodium / fluid intake and exercise as tolerated    Last GFR was 54 on may 29

## 2024-03-15 ENCOUNTER — Other Ambulatory Visit: Payer: Self-pay

## 2024-03-15 NOTE — Progress Notes (Deleted)
 Granix  being changed to Nivestym. Granix  PA denied.    Pharmacy Patient Advocate Encounter   Received notification from Patient Pharmacy that prior authorization for Nivestym is required/requested.   Insurance verification completed.   The patient is insured through [facility] .   Per test claim: PA required; However, NEW/RECENT labs/notes are needed to complete & submit PA request. Please see below.   Faxed provider CMM key-[id number]

## 2024-03-15 NOTE — Progress Notes (Signed)
 Granix  being changed to Nivestym. Granix  PA denied.    Pharmacy Patient Advocate Encounter   Received notification from Patient Pharmacy that prior authorization for Nivestym is required/requested.   Insurance verification completed.   The patient is insured through [facility] .   Per test claim: PA required; However, NEW/RECENT labs/notes are needed to complete & submit PA request. Please see below.   Faxed provider CMM key-[id number]

## 2024-03-16 ENCOUNTER — Other Ambulatory Visit (HOSPITAL_COMMUNITY): Payer: Self-pay

## 2024-03-16 ENCOUNTER — Other Ambulatory Visit: Payer: Self-pay

## 2024-03-20 ENCOUNTER — Other Ambulatory Visit: Payer: Self-pay

## 2024-03-20 ENCOUNTER — Other Ambulatory Visit (HOSPITAL_COMMUNITY): Payer: Self-pay

## 2024-03-20 DIAGNOSIS — D849 Immunodeficiency, unspecified: Secondary | ICD-10-CM | POA: Diagnosis not present

## 2024-03-20 DIAGNOSIS — B259 Cytomegaloviral disease, unspecified: Secondary | ICD-10-CM | POA: Diagnosis not present

## 2024-03-20 DIAGNOSIS — Z944 Liver transplant status: Secondary | ICD-10-CM | POA: Diagnosis not present

## 2024-03-20 MED ORDER — PREDNISONE 10 MG PO TABS
25.0000 mg | ORAL_TABLET | Freq: Every day | ORAL | 11 refills | Status: DC
  Filled 2024-03-20: qty 75, 30d supply, fill #0

## 2024-03-20 NOTE — Progress Notes (Signed)
 Pharmacy Patient Advocate Encounter  Received notification from [employer] that Prior Authorization for Nivestym  has been DENIED.      PA #/Case ID/Reference #: [id number]   Denial letter faxed to provider.

## 2024-03-21 ENCOUNTER — Other Ambulatory Visit (HOSPITAL_COMMUNITY): Payer: Self-pay

## 2024-03-22 ENCOUNTER — Other Ambulatory Visit: Payer: Self-pay

## 2024-03-23 ENCOUNTER — Other Ambulatory Visit (HOSPITAL_COMMUNITY): Payer: Self-pay

## 2024-03-24 ENCOUNTER — Other Ambulatory Visit: Payer: Self-pay

## 2024-03-28 DIAGNOSIS — Z944 Liver transplant status: Secondary | ICD-10-CM | POA: Diagnosis not present

## 2024-03-28 DIAGNOSIS — D849 Immunodeficiency, unspecified: Secondary | ICD-10-CM | POA: Diagnosis not present

## 2024-03-28 DIAGNOSIS — B259 Cytomegaloviral disease, unspecified: Secondary | ICD-10-CM | POA: Diagnosis not present

## 2024-03-29 ENCOUNTER — Other Ambulatory Visit (HOSPITAL_COMMUNITY): Payer: Self-pay

## 2024-03-29 ENCOUNTER — Other Ambulatory Visit: Payer: Self-pay

## 2024-03-29 MED ORDER — TACROLIMUS 1 MG PO CAPS: 2.0000 mg | ORAL_CAPSULE | Freq: Two times a day (BID) | ORAL | 11 refills | Status: DC

## 2024-03-29 MED ORDER — PREDNISONE 5 MG PO TABS
20.0000 mg | ORAL_TABLET | Freq: Every day | ORAL | 11 refills | Status: DC
  Filled 2024-03-29: qty 120, 30d supply, fill #0

## 2024-04-03 ENCOUNTER — Other Ambulatory Visit: Payer: Self-pay

## 2024-04-04 DIAGNOSIS — B259 Cytomegaloviral disease, unspecified: Secondary | ICD-10-CM | POA: Diagnosis not present

## 2024-04-04 DIAGNOSIS — Z944 Liver transplant status: Secondary | ICD-10-CM | POA: Diagnosis not present

## 2024-04-04 DIAGNOSIS — D849 Immunodeficiency, unspecified: Secondary | ICD-10-CM | POA: Diagnosis not present

## 2024-04-05 ENCOUNTER — Other Ambulatory Visit (HOSPITAL_COMMUNITY): Payer: Self-pay

## 2024-04-05 ENCOUNTER — Other Ambulatory Visit: Payer: Self-pay

## 2024-04-05 MED ORDER — TACROLIMUS 1 MG PO CAPS
2.0000 mg | ORAL_CAPSULE | Freq: Every morning | ORAL | 0 refills | Status: DC
  Filled 2024-04-05: qty 2, 1d supply, fill #0

## 2024-04-05 NOTE — Progress Notes (Signed)
 Pt not sure next steps. She is discussing alternative options with provider since Nivestym  PA was denied. She will reach out once she knows more. Dis-enrolling for now.

## 2024-04-06 DIAGNOSIS — M545 Low back pain, unspecified: Secondary | ICD-10-CM | POA: Diagnosis not present

## 2024-04-06 DIAGNOSIS — M48062 Spinal stenosis, lumbar region with neurogenic claudication: Secondary | ICD-10-CM | POA: Diagnosis not present

## 2024-04-11 DIAGNOSIS — Z944 Liver transplant status: Secondary | ICD-10-CM | POA: Diagnosis not present

## 2024-04-11 DIAGNOSIS — D849 Immunodeficiency, unspecified: Secondary | ICD-10-CM | POA: Diagnosis not present

## 2024-04-11 DIAGNOSIS — B259 Cytomegaloviral disease, unspecified: Secondary | ICD-10-CM | POA: Diagnosis not present

## 2024-04-12 ENCOUNTER — Other Ambulatory Visit (HOSPITAL_COMMUNITY): Payer: Self-pay

## 2024-04-12 MED ORDER — PREDNISONE 5 MG PO TABS: 15.0000 mg | ORAL_TABLET | Freq: Every day | ORAL | 11 refills | Status: DC

## 2024-04-24 ENCOUNTER — Other Ambulatory Visit (HOSPITAL_COMMUNITY): Payer: Self-pay

## 2024-04-24 MED ORDER — METHYLPREDNISOLONE 4 MG PO TBPK
ORAL_TABLET | ORAL | 0 refills | Status: DC
  Filled 2024-04-24: qty 21, 6d supply, fill #0

## 2024-04-24 MED ORDER — METHOCARBAMOL 750 MG PO TABS
750.0000 mg | ORAL_TABLET | Freq: Four times a day (QID) | ORAL | 0 refills | Status: DC
  Filled 2024-04-24: qty 120, 30d supply, fill #0

## 2024-04-25 ENCOUNTER — Other Ambulatory Visit: Payer: Self-pay

## 2024-04-25 ENCOUNTER — Encounter: Payer: Self-pay | Admitting: Family Medicine

## 2024-04-25 ENCOUNTER — Other Ambulatory Visit (HOSPITAL_COMMUNITY): Payer: Self-pay

## 2024-04-25 ENCOUNTER — Ambulatory Visit (INDEPENDENT_AMBULATORY_CARE_PROVIDER_SITE_OTHER): Admitting: Family Medicine

## 2024-04-25 VITALS — BP 138/82 | HR 98 | Temp 98.8°F | Ht 62.5 in | Wt 201.5 lb

## 2024-04-25 DIAGNOSIS — B259 Cytomegaloviral disease, unspecified: Secondary | ICD-10-CM | POA: Diagnosis not present

## 2024-04-25 DIAGNOSIS — Z944 Liver transplant status: Secondary | ICD-10-CM | POA: Diagnosis not present

## 2024-04-25 DIAGNOSIS — M48062 Spinal stenosis, lumbar region with neurogenic claudication: Secondary | ICD-10-CM | POA: Diagnosis not present

## 2024-04-25 DIAGNOSIS — M48061 Spinal stenosis, lumbar region without neurogenic claudication: Secondary | ICD-10-CM | POA: Insufficient documentation

## 2024-04-25 DIAGNOSIS — D849 Immunodeficiency, unspecified: Secondary | ICD-10-CM | POA: Diagnosis not present

## 2024-04-25 MED ORDER — TRAMADOL HCL 50 MG PO TABS
50.0000 mg | ORAL_TABLET | Freq: Two times a day (BID) | ORAL | 0 refills | Status: AC | PRN
  Filled 2024-04-25: qty 30, 15d supply, fill #0

## 2024-04-25 MED ORDER — GABAPENTIN 100 MG PO CAPS
100.0000 mg | ORAL_CAPSULE | Freq: Three times a day (TID) | ORAL | 3 refills | Status: DC
  Filled 2024-04-25: qty 90, 30d supply, fill #0
  Filled 2024-05-25 (×2): qty 90, 30d supply, fill #1
  Filled 2024-06-25: qty 90, 30d supply, fill #2
  Filled 2024-07-24: qty 90, 30d supply, fill #3

## 2024-04-25 NOTE — Progress Notes (Signed)
 Subjective:    Patient ID: Jamie Coleman, female    DOB: March 24, 1971, 53 y.o.   MRN: [id number]  HPI  Wt Readings from Last 3 Encounters:  04/25/24 201 lb 8 oz (91.4 kg)  02/09/24 192 lb 9.6 oz (87.4 kg)  09/22/23 201 lb (91.2 kg)   36.27 kg/m  Vitals:   04/25/24 1355 04/25/24 1433  BP: (!) 134/92 138/82  Pulse: 98   Temp: 98.8 F (37.1 C)   SpO2: 97%     Pt presents with c/o back pain    Goes to [facility] for lumbar stenosis with neurogenic claudication  Had bilateral L3-4 transforaminal epidural steroid inj with fluoro guidance on 6/26  It helped the pain in her back but not leg pain    Oxycodone  IR 5 mg last refilled 5/28 for 25 pills   (helps some)  Tylenol  limited to 2000 mg daily Did not tolerate lyrica   Takes gabapentin  300 mg tid  Robaxin  750 mg four times daily- has not tried it yet  Zanaflex  4 mg three times daily  Taking chronic prednisone   Had PT   Last a/p from May  ASSESSMENT AND PLAN:  Assessment & Plan Chronic back pain with neurogenic claudication Chronic back pain with radiculopathy affecting low back, right buttock, and lateral right leg. Pain exacerbated by ambulation and standing. Recent injection provided significant relief, indicating pain origin. NSAIDs contraindicated. Lyrica  ineffective with adverse effects. Oxycodone  offers partial relief.  - Oxycodone  prescription, gabapentin  prescription sent - Consider additional injections for pain management. - PM consult given    Per pt pain clinic referral was done and she is not candidate for surgery  Was told that insurance declined it -did not qualify  (?)   Sees back doctor in oct and hopes will be able to schedule surgery    Pain is in right buttock and leg  Tight in her back  Back catches in certain positions / like a pinch   Last MRI was at [facility]  Per pt spinal stenosis and lithesis and some disk bulging   If surgery- planning lateral approach/ fusion ?   This  heavily runs in family   Cannot walk or do dishes or anything right now   Ortho appointment is in October   May also try for [facility] surgery also for 2nd opinion  Dr Tonia   No b/b function loss No foot drop  Knows to watch for this       Patient Active Problem List   Diagnosis Date Noted   Lumbar spinal stenosis 04/25/2024   Elevated blood pressure reading 03/14/2024   Liver transplant rejection (HCC) 02/09/2024   Low back pain 09/22/2023   Thrush 09/22/2023   Osteopenia 09/19/2023   On prednisone  therapy 04/29/2023   Abdominal weakness 04/29/2023   Multiple joint pain 02/15/2023   Menopause 02/15/2023   H/O liver transplant (HCC) 02/15/2023   Estrogen deficiency 09/24/2022   Acute on chronic alcoholic liver disease (HCC) 09/22/2022   Decompensation of cirrhosis of liver (HCC) 09/22/2022   History of alcoholic hepatitis 09/22/2022   Cirrhosis, alcoholic (HCC) 09/02/2022   Macrocytosis without anemia 09/02/2022   Acute liver failure without hepatic coma 09/01/2022   Bronchitis due to COVID-19 virus 05/05/2022   Elevated liver transaminase level 09/03/2019   Subclinical hypothyroidism 09/03/2019   Vitamin D  deficiency 03/02/2018   Fatigue 03/02/2018   Pupil asymmetry 08/04/2016   Hemorrhoids 12/28/2014   Steroid-induced diabetes mellitus (HCC) 08/28/2014  Tremor 06/30/2013   Sebaceous cyst 02/15/2013   OTHER&UNSPECIFIED DISEASES THE ORAL SOFT TISSUES 07/25/2008   DERMATOFIBROMA, ARM 11/11/2007   NEOPLASM, SKIN, UNCERTAIN BEHAVIOR 02/11/2007   Hyperlipidemia 02/10/2007   Anxiety disorder 02/10/2007   Allergic rhinitis 02/10/2007   GERD 02/10/2007   IBS 02/10/2007   Past Medical History:  Diagnosis Date   Allergy    allergic rhinitis   Anxiety    situational   COVID 05/2021   Difficult intubation    states 2012 ablation surgery, had diff intubation,glide scope used, not noted in EPIC anesthesia record   Elevated hemoglobin A1c 08/08/2015   Elevated  liver function tests 08/08/2015   GERD (gastroesophageal reflux disease)    Hyperlipidemia    Low serum vitamin D  08/08/2015   Status post endometrial ablation    Tennis elbow    currently in PT-10/16   Past Surgical History:  Procedure Laterality Date   BIOPSY  02/09/2022   Procedure: BIOPSY;  Surgeon: Wilhelmenia Aloha Raddle., MD;  Location: [facility];  Service: Gastroenterology;;   CESAREAN SECTION     COLONOSCOPY WITH PROPOFOL  N/A 02/09/2022   Procedure: COLONOSCOPY WITH PROPOFOL ;  Surgeon: Wilhelmenia Aloha Raddle., MD;  Location: [facility];  Service: Gastroenterology;  Laterality: N/A;   ENDOMETRIAL ABLATION     8/12   ESOPHAGOGASTRODUODENOSCOPY (EGD) WITH PROPOFOL  N/A 02/09/2022   Procedure: ESOPHAGOGASTRODUODENOSCOPY (EGD) WITH PROPOFOL ;  Surgeon: Wilhelmenia Aloha Raddle., MD;  Location: [facility];  Service: Gastroenterology;  Laterality: N/A;   EXTREMITY WIRE/PIN REMOVAL Left 07/16/2016   Procedure: PIN REMOVAL OF LEFT WRIST X 2;  Surgeon: Elsie Mussel, MD;  Location: [facility];  Service: Orthopedics;  Laterality: Left;   FOOT SURGERY Left    LAPAROSCOPIC TUBAL LIGATION  06/09/2011   Procedure: LAPAROSCOPIC TUBAL LIGATION;  Surgeon: Bobie DELENA Cary;  Location: [facility];  Service: Gynecology;  Laterality: N/A;   LIGAMENT REPAIR Left 05/08/2016   Procedure: LEFT WRIST SCAPHOLUNATE LIGAMENT RECONSTRUCTION WITH TENDON GRAFT PIN NEURECTOMY AND REPAIR;  Surgeon: Elsie Mussel, MD;  Location: [facility];  Service: Orthopedics;  Laterality: Left;   LIVER TRANSPLANT  09/2022   POLYPECTOMY  02/09/2022   Procedure: POLYPECTOMY;  Surgeon: Mansouraty, Aloha Raddle., MD;  Location: [facility];  Service: Gastroenterology;;   TONSILLECTOMY     as child   Social History   Tobacco Use   Smoking status: Former    Current packs/day: 0.00    Types: Cigarettes    Quit date: 07/25/1983    Years since quitting: 40.7   Smokeless tobacco: Never   Tobacco  comments:    in high school  Vaping Use   Vaping status: Never Used  Substance Use Topics   Alcohol  use: Not Currently    Alcohol /week: 14.0 standard drinks of alcohol     Types: 14 Glasses of wine per week    Comment: 6 shots daily 12 shots daily on weekends   Drug use: No   Family History  Problem Relation Age of Onset   Cancer Mother        [place] insitu of appendix   Heart disease Father        CAD   Diabetes Father        type II   Alzheimer's disease Father    Colon polyps Father    Diabetes Brother        type II   Diabetes Maternal Grandfather    Diabetes Paternal Grandmother    Diabetes Paternal Actor  Breast cancer Neg Hx    Colon cancer Neg Hx    Stomach cancer Neg Hx    Esophageal cancer Neg Hx    Pancreatic cancer Neg Hx    Allergies  Allergen Reactions   Lipitor [Atorvastatin ] Other (See Comments)    myalgias   Current Outpatient Medications on File Prior to Visit  Medication Sig Dispense Refill   aspirin 81 MG chewable tablet Chew by mouth daily.     Calcium  Citrate-Vitamin D  315-5 MG-MCG TABS Take by mouth.     clotrimazole  (MYCELEX ) 10 MG troche Take 1 tablet (10 mg total) by mouth 2 (two) times daily. Dissolve tablet(troche) slowly and completely in mouth. 60 tablet 2   Continuous Glucose Sensor (FREESTYLE LIBRE 2 PLUS SENSOR) MISC Use 1 each as directed 6 each 3   filgrastim -aafi (NIVESTYM ) 480 MCG/0.8ML SOSY injection Inject 0.8 mLs (480 mcg total) subcutaneously once daily 0.8 mL 3   gabapentin  (NEURONTIN ) 300 MG capsule Take 1 capsule (300 mg total) by mouth 3 (three) times daily. 90 capsule 11   glucose blood (PRECISION QID TEST) test strip Use 1 strip to test 3 (three) times daily 100 each 1   insulin  lispro (HUMALOG ) 100 UNIT/ML KwikPen Inject 28 units with breakfast, 28 units with lunch, and 22 units with dinner in addition to sliding scale.  Max daily dose 110 units. 36 mL 5   Insulin  Pen Needle 31G X 5 MM MISC Use as directed 100 each 3    Insulin  Pen Needle 31G X 5 MM MISC Use as directed 100 each 5   Lancets (FREESTYLE) lancets use as directed three times daily. 100 each 1   LORazepam  (ATIVAN ) 1 MG tablet Take 1 tablet (1 mg total) by mouth 2 (two) times daily as needed for anxiety. Caution of sedation 30 tablet 0   methocarbamol  (ROBAXIN ) 750 MG tablet Take 1 tablet (750 mg total) by mouth 4 (four) times daily. 120 tablet 0   mycophenolate  (CELLCEPT ) 250 MG capsule Take 6 capsules (1,500 mg total) by mouth every 12 (twelve) hours. 360 capsule 11   omeprazole (PRILOSEC) 20 MG capsule Take 20 mg by mouth daily.     Polyvinyl Alcohol -Povidone (REFRESH OP) Place 1-2 drops into both eyes 4 (four) times daily as needed (dry eye).     predniSONE  (DELTASONE ) 5 MG tablet Take 3 tablets (15 mg total) by mouth daily. 90 tablet 11   sulfamethoxazole -trimethoprim  (BACTRIM  DS) 800-160 MG tablet Take 1 tablet by mouth every Monday, Wednesday, and Friday 12 tablet 2   tacrolimus  (PROGRAF ) 1 MG capsule Take 2 capsules (2 mg total) by mouth every morning AND 1 capsule (1 mg total) at bedtime. 2 capsule 0   Tbo-Filgrastim  (GRANIX ) 480 MCG/0.8ML SOSY injection Inject 0.73 mLs (438 mcg total) subcutaneously once daily 0.8 mL 3   tiZANidine  (ZANAFLEX ) 4 MG tablet Take 1 tablet (4 mg total) by mouth 3 (three) times daily. 90 tablet 2   valGANciclovir  (VALCYTE ) 450 MG tablet Take 2 tablets (900 mg total) by mouth daily with supper. 60 tablet 2   No current facility-administered medications on file prior to visit.    Review of Systems  Constitutional:  Positive for activity change. Negative for appetite change and fatigue.  Musculoskeletal:  Positive for back pain and myalgias. Negative for joint swelling.  Psychiatric/Behavioral:  The patient is nervous/anxious.        Objective:   Physical Exam Constitutional:      General: She is not in acute  distress.    Appearance: She is well-developed.  HENT:     Head: Normocephalic and atraumatic.   Eyes:     Conjunctiva/sclera: Conjunctivae normal.     Pupils: Pupils are equal, round, and reactive to light.  Neck:     Thyroid : No thyromegaly.     Vascular: No carotid bruit or JVD.  Cardiovascular:     Rate and Rhythm: Normal rate and regular rhythm.     Heart sounds: Normal heart sounds.     No gallop.  Pulmonary:     Effort: Pulmonary effort is normal. No respiratory distress.     Breath sounds: Normal breath sounds. No wheezing or rales.  Abdominal:     General: There is no distension or abdominal bruit.     Palpations: Abdomen is soft.  Musculoskeletal:        General: No swelling or deformity.     Cervical back: Normal range of motion and neck supple.     Right lower leg: No edema.     Left lower leg: No edema.  Lymphadenopathy:     Cervical: No cervical adenopathy.  Skin:    General: Skin is warm and dry.     Coloration: Skin is not pale.     Findings: No rash.  Neurological:     Mental Status: She is alert.     Coordination: Coordination normal.     Deep Tendon Reflexes: Reflexes are normal and symmetric. Reflexes normal.     Comments: Negative bent knee raise  No focal neuro def in RLE  Gait is slow but steady  Psychiatric:        Mood and Affect: Mood normal.           Assessment & Plan:   Problem List Items Addressed This Visit       Other   Lumbar spinal stenosis - Primary   From disk bulge and spondylolisthesis  Pt has gone to [facility] and also [facility] (reviewed what could be seen in care everywhere)  Has had two bilateral L3-4 transforaminal epidural steroid injections (first one worked better than 2nd)  Pain is primarily radicular in RLE , and worsens with activity Has failed PT and consv treatment  Planning a lumbar fusion when she is healthy enough from liver perspective  Needs pain control and is having difficulty getting into pain clinic with [facility]  New referral for local pain management done today  We will try increase  gabapentin  from 300 to 400 mg tid as tolerated (instructed pt to update with progress)   Has muscle relaxer (robaixn or zanaflex ) Intol of lyrica   Had short term oxycodone  from ortho  Needs something to help while waiting to get into pain clinic and waiting for potential surgery  Limited re: tylenol  due to liver transplant Already on prednisone   Cannot have nsaids   Referral to pain management done  ? If tramadol  would be safe for liver-pt will ask her liver team and get back to us    Call back and Er precautions noted in detail today   Reviewed red flag symptoms for cauda equina syndrome    Minutes were spent today both face to face and in the chart obtaining history, reviewing records and test results, performing exam , educating and discussing treatment options, and placing orders  35 minutes were spent today both face to face and in the chart obtaining history, reviewing records and test results, performing exam , educating and discussing treatment options, and  placing orders  Addendum- tramadol  is ok from liver team Sent to pharmacy            Relevant Orders   Ambulatory referral to Pain Clinic

## 2024-04-25 NOTE — Assessment & Plan Note (Addendum)
 From disk bulge and spondylolisthesis  Pt has gone to [facility] and also [facility] (reviewed what could be seen in care everywhere)  Has had two bilateral L3-4 transforaminal epidural steroid injections (first one worked better than 2nd)  Pain is primarily radicular in RLE , and worsens with activity Has failed PT and consv treatment  Planning a lumbar fusion when she is healthy enough from liver perspective  Needs pain control and is having difficulty getting into pain clinic with [facility]  New referral for local pain management done today  We will try increase gabapentin  from 300 to 400 mg tid as tolerated (instructed pt to update with progress)   Has muscle relaxer (robaixn or zanaflex ) Intol of lyrica   Had short term oxycodone  from ortho  Needs something to help while waiting to get into pain clinic and waiting for potential surgery  Limited re: tylenol  due to liver transplant Already on prednisone   Cannot have nsaids   Referral to pain management done  ? If tramadol  would be safe for liver-pt will ask her liver team and get back to us    Call back and Er precautions noted in detail today   Reviewed red flag symptoms for cauda equina syndrome    Minutes were spent today both face to face and in the chart obtaining history, reviewing records and test results, performing exam , educating and discussing treatment options, and placing orders  35 minutes were spent today both face to face and in the chart obtaining history, reviewing records and test results, performing exam , educating and discussing treatment options, and placing orders  Addendum- tramadol  is ok from liver team Sent to pharmacy

## 2024-04-25 NOTE — Patient Instructions (Addendum)
 Go up on gabapentin  from 300 to 400 mg three times daily and let us  know how that goes and how you tolerate it   I put the referral in for pain management  Please let us  know if you don't hear in 1-2 weeks to set that up  Check in with your liver team to see if tramadol  would be an option

## 2024-04-26 ENCOUNTER — Other Ambulatory Visit: Payer: Self-pay

## 2024-04-26 ENCOUNTER — Other Ambulatory Visit (HOSPITAL_COMMUNITY): Payer: Self-pay

## 2024-04-26 MED ORDER — PREDNISONE 5 MG PO TABS
12.5000 mg | ORAL_TABLET | Freq: Every day | ORAL | 3 refills | Status: DC
  Filled 2024-04-26: qty 75, 30d supply, fill #0

## 2024-05-01 ENCOUNTER — Encounter (HOSPITAL_COMMUNITY): Payer: Self-pay

## 2024-05-01 ENCOUNTER — Other Ambulatory Visit (HOSPITAL_COMMUNITY): Payer: Self-pay

## 2024-05-01 ENCOUNTER — Ambulatory Visit: Admitting: Family Medicine

## 2024-05-05 ENCOUNTER — Other Ambulatory Visit (HOSPITAL_COMMUNITY): Payer: Self-pay

## 2024-05-09 DIAGNOSIS — B259 Cytomegaloviral disease, unspecified: Secondary | ICD-10-CM | POA: Diagnosis not present

## 2024-05-09 DIAGNOSIS — R739 Hyperglycemia, unspecified: Secondary | ICD-10-CM | POA: Diagnosis not present

## 2024-05-09 DIAGNOSIS — T380X5A Adverse effect of glucocorticoids and synthetic analogues, initial encounter: Secondary | ICD-10-CM | POA: Diagnosis not present

## 2024-05-09 DIAGNOSIS — Z944 Liver transplant status: Secondary | ICD-10-CM | POA: Diagnosis not present

## 2024-05-09 DIAGNOSIS — Z7952 Long term (current) use of systemic steroids: Secondary | ICD-10-CM | POA: Diagnosis not present

## 2024-05-09 DIAGNOSIS — D849 Immunodeficiency, unspecified: Secondary | ICD-10-CM | POA: Diagnosis not present

## 2024-05-11 ENCOUNTER — Other Ambulatory Visit: Payer: Self-pay | Admitting: Student in an Organized Health Care Education/Training Program

## 2024-05-11 ENCOUNTER — Encounter: Payer: Self-pay | Admitting: Student in an Organized Health Care Education/Training Program

## 2024-05-11 ENCOUNTER — Ambulatory Visit (HOSPITAL_BASED_OUTPATIENT_CLINIC_OR_DEPARTMENT_OTHER): Admitting: Student in an Organized Health Care Education/Training Program

## 2024-05-11 ENCOUNTER — Other Ambulatory Visit (HOSPITAL_COMMUNITY): Payer: Self-pay

## 2024-05-11 ENCOUNTER — Ambulatory Visit
Admission: RE | Admit: 2024-05-11 | Discharge: 2024-05-11 | Disposition: A | Discharge status: Home / Self Care | Source: Ambulatory Visit | Attending: Student in an Organized Health Care Education/Training Program | Admitting: Student in an Organized Health Care Education/Training Program

## 2024-05-11 VITALS — BP 115/87 | HR 99 | Temp 97.2°F | Resp 16 | Ht 61.5 in | Wt 200.0 lb

## 2024-05-11 DIAGNOSIS — M5416 Radiculopathy, lumbar region: Secondary | ICD-10-CM | POA: Diagnosis not present

## 2024-05-11 DIAGNOSIS — R52 Pain, unspecified: Secondary | ICD-10-CM

## 2024-05-11 DIAGNOSIS — E099 Drug or chemical induced diabetes mellitus without complications: Secondary | ICD-10-CM | POA: Insufficient documentation

## 2024-05-11 DIAGNOSIS — T380X5A Adverse effect of glucocorticoids and synthetic analogues, initial encounter: Secondary | ICD-10-CM | POA: Insufficient documentation

## 2024-05-11 DIAGNOSIS — Z8719 Personal history of other diseases of the digestive system: Secondary | ICD-10-CM

## 2024-05-11 DIAGNOSIS — Z944 Liver transplant status: Secondary | ICD-10-CM | POA: Diagnosis not present

## 2024-05-11 DIAGNOSIS — G8929 Other chronic pain: Secondary | ICD-10-CM | POA: Diagnosis not present

## 2024-05-11 DIAGNOSIS — G894 Chronic pain syndrome: Secondary | ICD-10-CM

## 2024-05-11 MED ORDER — PREDNISONE 5 MG PO TABS
10.0000 mg | ORAL_TABLET | Freq: Every day | ORAL | 3 refills | Status: DC
  Filled 2024-05-11: qty 75, 37d supply, fill #0

## 2024-05-11 NOTE — Progress Notes (Signed)
 Safety precautions to be maintained throughout the outpatient stay will include: orient to surroundings, keep bed in low position, maintain call bell within reach at all times, provide assistance with transfer out of bed and ambulation.

## 2024-05-11 NOTE — Progress Notes (Signed)
 PROVIDER NOTE: Interpretation of information contained herein should be left to medically-trained personnel. Specific patient instructions are provided elsewhere under Patient Instructions section of medical record. This document was created in part using AI and STT-dictation technology, any transcriptional errors that may result from this process are unintentional.  Patient: Jamie Coleman  Service: E/M Encounter  Provider: Wallie Sherry, MD  DOB: [birth year]  Delivery: Face-to-face  Specialty: Interventional Pain Management  MRN: [id number]  Setting: Ambulatory outpatient facility  Specialty designation: 09  Type: New Patient  Location: Outpatient office facility  PCP: Randeen Laine LABOR, MD  DOS: 05/11/2024    Referring Prov.: Tower, Laine LABOR, MD   Primary Reason(s) for Visit: Encounter for initial evaluation of one or more chronic problems (new to examiner) potentially causing chronic pain, and posing a threat to normal musculoskeletal function. (Level of risk: High) CC: Back Pain  HPI  Jamie Coleman is a 53 y.o. year old, female patient, who comes for the first time to our practice referred by Tower, Laine LABOR, MD for our initial evaluation of her chronic pain. She has DERMATOFIBROMA, ARM; NEOPLASM, SKIN, UNCERTAIN BEHAVIOR; Hyperlipidemia; Anxiety disorder; Allergic rhinitis; OTHER&UNSPECIFIED DISEASES THE ORAL SOFT TISSUES; GERD; IBS; Sebaceous cyst; Tremor; Steroid-induced diabetes mellitus (HCC); Hemorrhoids; Pupil asymmetry; Vitamin D  deficiency; Fatigue; Elevated liver transaminase level; Subclinical hypothyroidism; Bronchitis due to COVID-19 virus; Acute liver failure without hepatic coma; Cirrhosis, alcoholic (HCC); Macrocytosis without anemia; Acute on chronic alcoholic liver disease (HCC); Decompensation of cirrhosis of liver (HCC); History of alcoholic hepatitis; Estrogen deficiency; Multiple joint pain; Menopause; H/O liver transplant (HCC); On prednisone  therapy; Abdominal weakness; Osteopenia;  Low back pain; Thrush; Liver transplant rejection (HCC); Elevated blood pressure reading; Lumbar spinal stenosis; and Lumbar radiculopathy on their problem list. Today she comes in for evaluation of her Back Pain  Pain Assessment: Location: Lower Back Radiating: through hips bilat, down outer aspect of right leg to ankle, down left leg to ankle Onset: More than a month ago Duration:   Quality: Shooting, Tightness, Sharp Severity: 7 /10 (subjective, self-reported pain score)  Effect on ADL: , worse when standing Timing: Constant Modifying factors: epidurals BP: 115/87  HR: 99  Onset and Duration: Present longer than 3 months Cause of pain: Unknown Severity: NAS-11 at its worse: 10/10, NAS-11 at its best: 0/10, NAS-11 now: 0/10, and NAS-11 on the average: 7/10 Timing: During activity or exercise, After activity or exercise, and After a period of immobility Aggravating Factors: Bending, Climbing, Kneeling, Lifiting, Motion, Prolonged standing, Squatting, Stooping , Twisting, Walking, Walking uphill, Walking downhill, and Working Alleviating Factors: Lying down, Resting, Sitting, and Sleeping Associated Problems: Spasms Quality of Pain: Aching, Agonizing, Annoying, Intermittent, Deep, Disabling, Distressing, Exhausting, Horrible, Nagging, Sharp, Shooting, Tiring, and Uncomfortable Previous Examinations or Tests: MRI scan and Neurosurgical evaluation Previous Treatments: Epidural steroid injections and Steroid treatments by mouth  Ms. Grilliot is being evaluated for possible interventional pain management therapies for the treatment of her chronic pain.   Discussed the use of AI scribe software for clinical note transcription with the patient, who gave verbal consent to proceed.  History of Present Illness   Jamie Coleman is a 53 year old female with chronic low back pain who presents for pain management. She was referred by an orthopedic specialist for pain management and physical  therapy.  She experiences chronic low back pain with right-sided predominance and radiation to the right leg. The pain has been severe enough to disrupt sleep and daily activities prior to receiving back  injections. She has undergone two sets of bilateral L3, L4 transforaminal epidural steroid injections, with the first injection on January 27, 2024, and the second on April 06, 2024. The first injection significantly reduced her pain, allowing her to sleep and move more comfortably, while the second injection alleviated pain in her left leg and back tightness temporarily. However, the pain in her left calf has returned.  She has a history of liver transplant due to cirrhosis secondary to alcohol  use and COVID-19, with three episodes of rejection post-transplant. She is unable to take NSAIDs due to her liver condition but can take up to 2000 mg of Tylenol  daily. She is currently on gabapentin  400 mg three times a day, which helps but causes sedation. She has also tried Lyrica , which caused 'weird dreams,' and oxycodone , which dulled the pain but also caused tiredness. Muscle relaxants like tizanidine  and methocarbamol  have been used, with methocarbamol  being more effective but also causing sedation.  Her family history is significant for back issues, with her father, sister, and brother having undergone back surgeries or injections, primarily at the L2, L3 levels. Her cousin has also had back surgery.  No burning or tingling in her feet related to diabetes, which is steroid-induced and has been present for just over a year.       Historic Controlled Substance Pharmacotherapy Review 04/26/2024 04/25/2024  1 Tramadol  Hcl 50 Mg Tablet 30.00 15 Ma Tow [id number] Con (0126) 0/0 20.00 MME Other [place]  04/25/2024 04/25/2024  1 Gabapentin  100 Mg Capsule 90.00 30 Ma Tow [id number] Con (0126) 0/3  Other [place]  04/10/2024 03/08/2024  1 Gabapentin  300 Mg Capsule 90.00 30 Clint Messenger [id number] Con (0126) 1/11  Other [place]   03/14/2024 03/14/2024  1 Diazepam  5 Mg Tablet 1.00 1 Ho Guo [id number] Con (0126) 0/0 0.50 LME Other [place]  03/08/2024 03/08/2024  1 Gabapentin  300 Mg Capsule 90.00 30 Clint Messenger [id number] Con (0126) 0/11  Other [place]   Historical Monitoring: The patient  reports no history of drug use. List of prior UDS Testing: Lab Results  Component Value Date   MDMA NONE DETECTED 09/17/2021   COCAINSCRNUR NONE DETECTED 09/01/2022   COCAINSCRNUR NONE DETECTED 09/17/2021   PCPSCRNUR NONE DETECTED 09/17/2021   THCU NONE DETECTED 09/01/2022   THCU NONE DETECTED 09/17/2021   ETH <10 09/02/2022   Historical Background Evaluation: [place] PMP: PDMP reviewed during this encounter. Review of the past 38-months conducted.             [place] Department of public safety, offender search: Engineer, mining Information) Non-contributory Risk Assessment Profile: Aberrant behavior: None observed or detected today Fatal overdose hazard ratio (HR): Calculation deferred Non-fatal overdose hazard ratio (HR): Calculation deferred Risk of opioid abuse or dependence: 0.7-3.0% with doses <= 36 MME/day and 6.1-26% with doses >= 120 MME/day. Substance use disorder (SUD) risk level: Low   Pharmacologic Plan: As per protocol, I have not taken over any controlled substance management, pending the results of ordered tests and/or consults.            Initial impression: Pending review of available data and ordered tests.  Meds   Current Outpatient Medications:    aspirin 81 MG chewable tablet, Chew by mouth daily., Disp: , Rfl:    Calcium  Citrate-Vitamin D  315-5 MG-MCG TABS, Take by mouth., Disp: , Rfl:    clotrimazole  (MYCELEX ) 10 MG troche, Take 1 tablet (10 mg total) by mouth 2 (two) times daily. Dissolve tablet(troche) slowly and completely in mouth., Disp:  60 tablet, Rfl: 2   Continuous Glucose Sensor (FREESTYLE LIBRE 2 PLUS SENSOR) MISC, Use 1 each as directed, Disp: 6 each, Rfl: 3   filgrastim -aafi (NIVESTYM ) 480 MCG/0.8ML SOSY injection,  Inject 0.8 mLs (480 mcg total) subcutaneously once daily, Disp: 0.8 mL, Rfl: 3   gabapentin  (NEURONTIN ) 100 MG capsule, Take 1 capsule (100 mg total) by mouth 3 (three) times daily. In addition to the 300 mg pill, Disp: 90 capsule, Rfl: 3   gabapentin  (NEURONTIN ) 300 MG capsule, Take 1 capsule (300 mg total) by mouth 3 (three) times daily., Disp: 90 capsule, Rfl: 11   glucose blood (PRECISION QID TEST) test strip, Use 1 strip to test 3 (three) times daily, Disp: 100 each, Rfl: 1   insulin  lispro (HUMALOG ) 100 UNIT/ML KwikPen, Inject 28 units with breakfast, 28 units with lunch, and 22 units with dinner in addition to sliding scale.  Max daily dose 110 units., Disp: 36 mL, Rfl: 5   Insulin  Pen Needle 31G X 5 MM MISC, Use as directed, Disp: 100 each, Rfl: 3   Insulin  Pen Needle 31G X 5 MM MISC, Use as directed, Disp: 100 each, Rfl: 5   Lancets (FREESTYLE) lancets, use as directed three times daily., Disp: 100 each, Rfl: 1   LORazepam  (ATIVAN ) 1 MG tablet, Take 1 tablet (1 mg total) by mouth 2 (two) times daily as needed for anxiety. Caution of sedation, Disp: 30 tablet, Rfl: 0   methocarbamol  (ROBAXIN ) 750 MG tablet, Take 1 tablet (750 mg total) by mouth 4 (four) times daily., Disp: 120 tablet, Rfl: 0   mycophenolate  (CELLCEPT ) 250 MG capsule, Take 6 capsules (1,500 mg total) by mouth every 12 (twelve) hours., Disp: 360 capsule, Rfl: 11   omeprazole (PRILOSEC) 20 MG capsule, Take 20 mg by mouth daily., Disp: , Rfl:    Polyvinyl Alcohol -Povidone (REFRESH OP), Place 1-2 drops into both eyes 4 (four) times daily as needed (dry eye)., Disp: , Rfl:    predniSONE  (DELTASONE ) 5 MG tablet, Take 2.5 tablets (12.5 mg total) by mouth daily., Disp: 75 tablet, Rfl: 3   sulfamethoxazole -trimethoprim  (BACTRIM  DS) 800-160 MG tablet, Take 1 tablet by mouth every Monday, Wednesday, and Friday, Disp: 12 tablet, Rfl: 2   tacrolimus  (PROGRAF ) 1 MG capsule, Take 2 capsules (2 mg total) by mouth every morning AND 1 capsule (1  mg total) at bedtime., Disp: 2 capsule, Rfl: 0   Tbo-Filgrastim  (GRANIX ) 480 MCG/0.8ML SOSY injection, Inject 0.73 mLs (438 mcg total) subcutaneously once daily, Disp: 0.8 mL, Rfl: 3   traMADol  (ULTRAM ) 50 MG tablet, Take 1 tablet (50 mg total) by mouth every 12 (twelve) hours as needed. Caution of sedation, Disp: 30 tablet, Rfl: 0   valGANciclovir  (VALCYTE ) 450 MG tablet, Take 2 tablets (900 mg total) by mouth daily with supper., Disp: 60 tablet, Rfl: 2  Imaging Review   DG Lumbar Spine 2-3 Views  Narrative CLINICAL DATA:  Low back pain radiating to the SI joints bilaterally. Worse on the left.  EXAM: LUMBAR SPINE - 2 VIEW  COMPARISON:  None Available.  FINDINGS: Five lumbar-type vertebral bodies. There is levoconvex curvature of the spine centered at L3. Preserved vertebral body heights. There is disc height loss at L2-3, L3-4. Minimal endplate osteophytes. Prominent lower lumbar facet degenerative changes. No listhesis. Surgical clips overlie the right upper quadrant. If there is further concern of the acute process additional cross-sectional study could be considered as clinically appropriate.  IMPRESSION: Curvature of the spine with moderate degenerative changes.  Electronically Signed By: Ranell Bring M.D. On: 09/22/2023 17:17   DG Wrist Complete Left  Narrative CLINICAL DATA:  Left wrist injury.  Initial evaluation.  EXAM: LEFT WRIST - COMPLETE 3+ VIEW  COMPARISON:  No recent prior.  FINDINGS: No acute bony or joint abnormality identified. No evidence of fracture dislocation. Normal mineralization.  IMPRESSION: No acute abnormality.   Electronically Signed By: Debby  Register On: 02/06/2016 16:12   Complexity Note: Imaging results reviewed.                         ROS  Cardiovascular: No reported cardiovascular signs or symptoms such as High blood pressure, coronary artery disease, abnormal heart rate or rhythm, heart attack, blood thinner  therapy or heart weakness and/or failure Pulmonary or Respiratory: No reported pulmonary signs or symptoms such as wheezing and difficulty taking a deep full breath (Asthma), difficulty blowing air out (Emphysema), coughing up mucus (Bronchitis), persistent dry cough, or temporary stoppage of breathing during sleep Neurological: No reported neurological signs or symptoms such as seizures, abnormal skin sensations, urinary and/or fecal incontinence, being born with an abnormal open spine and/or a tethered spinal cord Psychological-Psychiatric: Anxiousness and Prone to panicking Gastrointestinal: Scarred liver (Cirrhosis) Genitourinary: No reported renal or genitourinary signs or symptoms such as difficulty voiding or producing urine, peeing blood, non-functioning kidney, kidney stones, difficulty emptying the bladder, difficulty controlling the flow of urine, or chronic kidney disease Hematological: No reported hematological signs or symptoms such as prolonged bleeding, low or poor functioning platelets, bruising or bleeding easily, hereditary bleeding problems, low energy levels due to low hemoglobin or being anemic Endocrine: steroid induced diabetes Rheumatologic: No reported rheumatological signs and symptoms such as fatigue, joint pain, tenderness, swelling, redness, heat, stiffness, decreased range of motion, with or without associated rash Musculoskeletal: Negative for myasthenia gravis, muscular dystrophy, multiple sclerosis or malignant hyperthermia Work History: Out of work due to pain and Quit going to work on his/her own  Allergies  Ms. Douse is allergic to lipitor [atorvastatin ].  Laboratory Chemistry Profile   Renal Lab Results  Component Value Date   BUN 43 (H) 09/25/2022   CREATININE 1.2 (A) 03/09/2024   GFR 34.93 (L) 09/25/2022   GFRAA >60 07/15/2020   GFRNONAA >60 09/22/2022   PHUR 6.0 08/19/2016   PROTEINUR NEGATIVE 09/21/2022     Electrolytes Lab Results   Component Value Date   NA 122 (L) 09/25/2022   K 5.3 (H) 09/25/2022   CL 89 (L) 09/25/2022   CALCIUM  9.4 09/25/2022   MG 2.4 09/07/2022   PHOS 3.1 09/17/2021     Hepatic Lab Results  Component Value Date   AST 33 03/09/2024   ALT 61 (A) 03/09/2024   ALBUMIN  3.0 (L) 09/25/2022   ALKPHOS 300 (H) 09/25/2022   LIPASE 59 (H) 09/21/2022   AMMONIA 66 (H) 09/25/2022     ID Lab Results  Component Value Date   HIV Non Reactive 09/21/2022   SARSCOV2NAA NEGATIVE 09/17/2021   STAPHAUREUS NEGATIVE 06/05/2011   MRSAPCR NEGATIVE 06/05/2011   HCVAB NON REACTIVE 09/18/2021   PREGTESTUR NEGATIVE 06/09/2011     Bone Lab Results  Component Value Date   VD25OH 62.42 08/30/2019     Endocrine Lab Results  Component Value Date   GLUCOSE 116 (H) 09/25/2022   GLUCOSEU 50 (A) 09/21/2022   HGBA1C 6.9 (A) 04/29/2023   TSH 1.29 02/15/2023   FREET4 0.63 02/15/2023     Neuropathy Lab Results  Component Value Date   VITAMINB12 1,940 (H) 09/02/2022   FOLATE 12.1 09/02/2022   HGBA1C 6.9 (A) 04/29/2023   HIV Non Reactive 09/21/2022     CNS No results found for: COLORCSF, APPEARCSF, RBCCOUNTCSF, WBCCSF, POLYSCSF, LYMPHSCSF, EOSCSF, PROTEINCSF, GLUCCSF, JCVIRUS, CSFOLI, IGGCSF, LABACHR, ACETBL   Inflammation (CRP: Acute  ESR: Chronic) Lab Results  Component Value Date   CRP 20.1 (H) 09/03/2022   ESRSEDRATE 5 02/15/2023   LATICACIDVEN 1.2 09/21/2022     Rheumatology Lab Results  Component Value Date   RF <10 02/15/2023   ANA NEGATIVE 02/15/2023     Coagulation Lab Results  Component Value Date   INR 2.5 (H) 09/25/2022   LABPROT 25.8 (H) 09/25/2022   APTT 24.3 10/16/2011   PLT 194.0 Repeated and verified X2. 09/25/2022   DDIMER <0.22 05/04/2012     Cardiovascular Lab Results  Component Value Date   BNP 154.9 (H) 09/02/2022   CKTOTAL 36 (L) 09/01/2022   HGB 11.7 (A) 03/09/2024   HCT 36.1 09/25/2022     Screening Lab Results  Component  Value Date   SARSCOV2NAA NEGATIVE 09/17/2021   STAPHAUREUS NEGATIVE 06/05/2011   MRSAPCR NEGATIVE 06/05/2011   HCVAB NON REACTIVE 09/18/2021   HIV Non Reactive 09/21/2022   PREGTESTUR NEGATIVE 06/09/2011     Cancer No results found for: CEA, CA125, LABCA2   Allergens No results found for: ALMOND, APPLE, ASPARAGUS, AVOCADO, BANANA, BARLEY, BASIL, BAYLEAF, GREENBEAN, LIMABEAN, WHITEBEAN, BEEFIGE, REDBEET, BLUEBERRY, BROCCOLI, CABBAGE, MELON, CARROT, CASEIN, CASHEWNUT, CAULIFLOWER, CELERY     Note: Lab results reviewed.  PFSH  Drug: Ms. Mesquita  reports no history of drug use. Alcohol :  reports that she does not currently use alcohol  after a past usage of about 14.0 standard drinks of alcohol  per week. Tobacco:  reports that she quit smoking about 40 years ago. Her smoking use included cigarettes. She has never used smokeless tobacco. Medical:  has a past medical history of Allergy, Anxiety, COVID (05/2021), Difficult intubation, Elevated hemoglobin A1c (08/08/2015), Elevated liver function tests (08/08/2015), GERD (gastroesophageal reflux disease), Hyperlipidemia, Low serum vitamin D  (08/08/2015), Status post endometrial ablation, and Tennis elbow. Family: family history includes Alzheimer's disease in her father; Cancer in her mother; Colon polyps in her father; Diabetes in her brother, father, maternal grandfather, paternal grandfather, and paternal grandmother; Heart disease in her father.  Past Surgical History:  Procedure Laterality Date   BIOPSY  02/09/2022   Procedure: BIOPSY;  Surgeon: Wilhelmenia Aloha Raddle., MD;  Location: [facility];  Service: Gastroenterology;;   CESAREAN SECTION     COLONOSCOPY WITH PROPOFOL  N/A 02/09/2022   Procedure: COLONOSCOPY WITH PROPOFOL ;  Surgeon: Wilhelmenia Aloha Raddle., MD;  Location: [facility];  Service: Gastroenterology;  Laterality: N/A;   ENDOMETRIAL ABLATION     8/12    ESOPHAGOGASTRODUODENOSCOPY (EGD) WITH PROPOFOL  N/A 02/09/2022   Procedure: ESOPHAGOGASTRODUODENOSCOPY (EGD) WITH PROPOFOL ;  Surgeon: Wilhelmenia Aloha Raddle., MD;  Location: [facility];  Service: Gastroenterology;  Laterality: N/A;   EXTREMITY WIRE/PIN REMOVAL Left 07/16/2016   Procedure: PIN REMOVAL OF LEFT WRIST X 2;  Surgeon: Elsie Mussel, MD;  Location: [facility];  Service: Orthopedics;  Laterality: Left;   FOOT SURGERY Left    LAPAROSCOPIC TUBAL LIGATION  06/09/2011   Procedure: LAPAROSCOPIC TUBAL LIGATION;  Surgeon: Bobie DELENA Cary;  Location: [facility];  Service: Gynecology;  Laterality: N/A;   LIGAMENT REPAIR Left 05/08/2016   Procedure: LEFT WRIST SCAPHOLUNATE LIGAMENT RECONSTRUCTION WITH TENDON GRAFT PIN NEURECTOMY AND REPAIR;  Surgeon: Elsie  Camella, MD;  Location: [facility];  Service: Orthopedics;  Laterality: Left;   LIVER TRANSPLANT  09/2022   POLYPECTOMY  02/09/2022   Procedure: POLYPECTOMY;  Surgeon: Mansouraty, Aloha Raddle., MD;  Location: [facility];  Service: Gastroenterology;;   TONSILLECTOMY     as child   Active Ambulatory Problems    Diagnosis Date Noted   DERMATOFIBROMA, ARM 11/11/2007   NEOPLASM, SKIN, UNCERTAIN BEHAVIOR 02/11/2007   Hyperlipidemia 02/10/2007   Anxiety disorder 02/10/2007   Allergic rhinitis 02/10/2007   OTHER&UNSPECIFIED DISEASES THE ORAL SOFT TISSUES 07/25/2008   GERD 02/10/2007   IBS 02/10/2007   Sebaceous cyst 02/15/2013   Tremor 06/30/2013   Steroid-induced diabetes mellitus (HCC) 08/28/2014   Hemorrhoids 12/28/2014   Pupil asymmetry 08/04/2016   Vitamin D  deficiency 03/02/2018   Fatigue 03/02/2018   Elevated liver transaminase level 09/03/2019   Subclinical hypothyroidism 09/03/2019   Bronchitis due to COVID-19 virus 05/05/2022   Acute liver failure without hepatic coma 09/01/2022   Cirrhosis, alcoholic (HCC) 09/02/2022   Macrocytosis without anemia 09/02/2022   Acute on chronic alcoholic liver  disease (HCC) 87/87/7976   Decompensation of cirrhosis of liver (HCC) 09/22/2022   History of alcoholic hepatitis 09/22/2022   Estrogen deficiency 09/24/2022   Multiple joint pain 02/15/2023   Menopause 02/15/2023   H/O liver transplant (HCC) 02/15/2023   On prednisone  therapy 04/29/2023   Abdominal weakness 04/29/2023   Osteopenia 09/19/2023   Low back pain 09/22/2023   Thrush 09/22/2023   Liver transplant rejection (HCC) 02/09/2024   Elevated blood pressure reading 03/14/2024   Lumbar spinal stenosis 04/25/2024   Lumbar radiculopathy 05/11/2024   Resolved Ambulatory Problems    Diagnosis Date Noted   POSTPARTUM DEPRESSION 02/10/2007   Diarrhea 01/30/2011   Tachycardia 01/30/2011   URI (upper respiratory infection) 01/30/2011   Dizziness 01/30/2011   Shoulder pain, right 05/04/2011   Impingement syndrome of right shoulder 05/13/2011   Bruising 10/16/2011   Tingling 06/24/2012   Actinic keratosis of right cheek 02/15/2013   Mouth pain 03/01/2013   Exercise intolerance 08/07/2013   Acute sinusitis 04/17/2014   Rash and nonspecific skin eruption 08/26/2014   Epicondylitis, lateral 04/09/2015   Scapholunate dissociation of left wrist 05/08/2016   Skin lesion 03/02/2018   Skin infection 08/30/2019   Routine general medical examination at a health care facility 08/19/2020   Alcoholic hepatitis 09/17/2021   Alcohol  withdrawal syndrome without complication (HCC)    Alcohol  withdrawal (HCC) 09/17/2021   Alcohol  dependence (HCC) 09/24/2021   Shortness of breath 05/05/2022   Elevated lipase 09/02/2022   Acute hepatic encephalopathy (HCC) 09/21/2022   Lethargy 09/22/2022   Hyponatremia 09/23/2022   Past Medical History:  Diagnosis Date   Allergy    Anxiety    COVID 05/2021   Difficult intubation    Elevated hemoglobin A1c 08/08/2015   Elevated liver function tests 08/08/2015   GERD (gastroesophageal reflux disease)    Low serum vitamin D  08/08/2015   Status post  endometrial ablation    Tennis elbow    Constitutional Exam  General appearance: Well nourished, well developed, and well hydrated. In no apparent acute distress Vitals:   05/11/24 1033  BP: 115/87  Pulse: 99  Resp: 16  Temp: (!) 97.2 F (36.2 C)  SpO2: 99%  Weight: 200 lb (90.7 kg)  Height: 5' 1.5 (1.562 m)   BMI Assessment: Estimated body mass index is 37.18 kg/m as calculated from the following:   Height as of this encounter: 5' 1.5 (  1.562 m).   Weight as of this encounter: 200 lb (90.7 kg).  BMI interpretation table: BMI level Category Range association with higher incidence of chronic pain  <18 kg/m2 Underweight   18.5-24.9 kg/m2 Ideal body weight   25-29.9 kg/m2 Overweight Increased incidence by 20%  30-34.9 kg/m2 Obese (Class I) Increased incidence by 68%  35-39.9 kg/m2 Severe obesity (Class II) Increased incidence by 136%  >40 kg/m2 Extreme obesity (Class III) Increased incidence by 254%   Patient's current BMI Ideal Body weight  Body mass index is 37.18 kg/m. Ideal body weight: 48.9 kg (107 lb 14.6 oz) Adjusted ideal body weight: 65.7 kg (144 lb 12 oz)   BMI Readings from Last 4 Encounters:  05/11/24 37.18 kg/m  04/25/24 36.27 kg/m  03/14/24 34.67 kg/m  02/09/24 34.67 kg/m   Wt Readings from Last 4 Encounters:  05/11/24 200 lb (90.7 kg)  04/25/24 201 lb 8 oz (91.4 kg)  02/09/24 192 lb 9.6 oz (87.4 kg)  09/22/23 201 lb (91.2 kg)    Psych/Mental status: Alert, oriented x 3 (person, place, & time)       Eyes: PERLA Respiratory: No evidence of acute respiratory distress  Thoracic Spine Area Exam  Skin & Axial Inspection: No masses, redness, or swelling Alignment: Symmetrical Functional ROM: Unrestricted ROM Stability: No instability detected Muscle Tone/Strength: Functionally intact. No obvious neuro-muscular anomalies detected. Sensory (Neurological): Unimpaired Muscle strength & Tone: No palpable anomalies Lumbar Spine Area Exam  Skin & Axial  Inspection: No masses, redness, or swelling Alignment: Symmetrical Functional ROM: Limited ROM       Stability: No instability detected Muscle Tone/Strength: Functionally intact. No obvious neuro-muscular anomalies detected. Sensory (Neurological): Dermatomal pain pattern Palpation: No palpable anomalies       Provocative Tests: Hyperextension/rotation test: (+) due to pain. Lumbar quadrant test (Kemp's test): (+) bilateral for foraminal stenosis  Gait & Posture Assessment  Ambulation: Unassisted Gait: Relatively normal for age and body habitus Posture: WNL  Lower Extremity Exam    Side: Right lower extremity  Side: Left lower extremity  Stability: No instability observed          Stability: No instability observed          Skin & Extremity Inspection: Skin color, temperature, and hair growth are WNL. No peripheral edema or cyanosis. No masses, redness, swelling, asymmetry, or associated skin lesions. No contractures.  Skin & Extremity Inspection: Skin color, temperature, and hair growth are WNL. No peripheral edema or cyanosis. No masses, redness, swelling, asymmetry, or associated skin lesions. No contractures.  Functional ROM: Pain restricted ROM for all joints of the lower extremity          Functional ROM: Pain restricted ROM for all joints of the lower extremity          Muscle Tone/Strength: Functionally intact. No obvious neuro-muscular anomalies detected.  Muscle Tone/Strength: Functionally intact. No obvious neuro-muscular anomalies detected.  Sensory (Neurological): Unimpaired        Sensory (Neurological): Unimpaired        DTR: Patellar: deferred today Achilles: deferred today Plantar: deferred today  DTR: Patellar: deferred today Achilles: deferred today Plantar: deferred today  Palpation: No palpable anomalies  Palpation: No palpable anomalies    Assessment  Primary Diagnosis & Pertinent Problem List: The primary encounter diagnosis was Chronic radicular lumbar  pain. Diagnoses of Lumbar radiculopathy, H/O liver transplant (HCC), History of alcoholic hepatitis, Steroid-induced diabetes mellitus, initial encounter (HCC), and Chronic pain syndrome were also pertinent to this visit.  Visit Diagnosis (New problems to examiner): 1. Chronic radicular lumbar pain   2. Lumbar radiculopathy   3. H/O liver transplant (HCC)   4. History of alcoholic hepatitis   5. Steroid-induced diabetes mellitus, initial encounter (HCC)   6. Chronic pain syndrome    Plan of Care (Initial workup plan)  Assessment and Plan    Chronic low back pain with bilateral lower extremity radiculopathy   Chronic low back pain with bilateral lower extremity radiculopathy is more severe on the right side. Pain improved with previous L3-L4 transforaminal epidural steroid injections but has not completely resolved. Current management includes gabapentin , oxycodone , and muscle relaxants, which cause sedation. Surgery is not an option due to liver stability issues, and she seeks pain management until surgery is possible. Her liver transplant and steroid-induced hyperglycemia limit medication options. Discussed using a Butrans patch instead of oxycodone , but it requires transplant clinic approval. Hydrocodone  or Percocet are considered alternatives, with a maximum acetaminophen  intake of 2000 mg per day. Perform a urine toxicology screen today. Follow up next Wednesday to review results and discuss medication management. Consider hydrocodone  or Percocet for pain management, pending urine screen results. Schedule another back injection if needed, based on medication efficacy. Refer to neurosurgeons at [facility] for a second opinion when closer to surgery eligibility.      Lab Orders         Compliance Drug Analysis, Ur       Pharmacotherapy (current): Medications ordered:  No orders of the defined types were placed in this encounter.  Medications administered during this visit: Jamie DOROTHA Coleman  had no medications administered during this visit.   Interventional management options: Ms. Madole was informed that there is no guarantee that she would be a candidate for interventional therapies. The decision will be based on the results of diagnostic studies, as well as Ms. Patient's risk profile.  Procedure(s) under consideration:  Pending results of ordered studies   Provider-requested follow-up: Return in about 6 days (around 05/17/2024) for 2nd pt visit (Discuss Norco vs Perc).  Future Appointments  Date Time Provider Department Center  05/17/2024  2:20 PM Marcelino Nurse, MD [facility] None  08/07/2024 10:00 AM Amundson JAYSON Nikki Bobie FORBES, MD [facility] None   I discussed the assessment and treatment plan with the patient. The patient was provided an opportunity to ask questions and all were answered. The patient agreed with the plan and demonstrated an understanding of the instructions.  Patient advised to call back or seek an in-person evaluation if the symptoms or condition worsens.  Duration of encounter: .  Total time on encounter, as per AMA guidelines included both the face-to-face and non-face-to-face time personally spent by the physician and/or other qualified health care professional(s) on the day of the encounter (includes time in activities that require the physician or other qualified health care professional and does not include time in activities normally performed by clinical staff). Physician's time may include the following activities when performed: Preparing to see the patient (e.g., pre-charting review of records, searching for previously ordered imaging, lab work, and nerve conduction tests) Review of prior analgesic pharmacotherapies. Reviewing PMP Interpreting ordered tests (e.g., lab work, imaging, nerve conduction tests) Performing post-procedure evaluations, including interpretation of diagnostic procedures Obtaining and/or reviewing separately obtained  history Performing a medically appropriate examination and/or evaluation Counseling and educating the patient/family/caregiver Ordering medications, tests, or procedures Referring and communicating with other health care professionals (when not separately reported) Documenting clinical information in the electronic or other  health record Independently interpreting results (not separately reported) and communicating results to the patient/ family/caregiver Care coordination (not separately reported)  Note by: Wallie Sherry, MD (TTS and AI technology used. I apologize for any typographical errors that were not detected and corrected.) Date: 05/11/2024; Time: 11:58 AM

## 2024-05-12 ENCOUNTER — Other Ambulatory Visit (HOSPITAL_COMMUNITY): Payer: Self-pay

## 2024-05-15 LAB — COMPLIANCE DRUG ANALYSIS, UR

## 2024-05-17 ENCOUNTER — Other Ambulatory Visit (HOSPITAL_COMMUNITY): Payer: Self-pay

## 2024-05-17 ENCOUNTER — Encounter: Payer: Self-pay | Admitting: Student in an Organized Health Care Education/Training Program

## 2024-05-17 ENCOUNTER — Ambulatory Visit
Attending: Student in an Organized Health Care Education/Training Program | Admitting: Student in an Organized Health Care Education/Training Program

## 2024-05-17 VITALS — BP 114/97 | HR 111 | Temp 97.4°F | Resp 16 | Ht 61.5 in | Wt 200.0 lb

## 2024-05-17 DIAGNOSIS — G894 Chronic pain syndrome: Secondary | ICD-10-CM | POA: Insufficient documentation

## 2024-05-17 DIAGNOSIS — Z0289 Encounter for other administrative examinations: Secondary | ICD-10-CM | POA: Insufficient documentation

## 2024-05-17 DIAGNOSIS — Z944 Liver transplant status: Secondary | ICD-10-CM | POA: Insufficient documentation

## 2024-05-17 DIAGNOSIS — Z8719 Personal history of other diseases of the digestive system: Secondary | ICD-10-CM | POA: Insufficient documentation

## 2024-05-17 DIAGNOSIS — M5416 Radiculopathy, lumbar region: Secondary | ICD-10-CM | POA: Diagnosis not present

## 2024-05-17 DIAGNOSIS — T380X5A Adverse effect of glucocorticoids and synthetic analogues, initial encounter: Secondary | ICD-10-CM | POA: Insufficient documentation

## 2024-05-17 DIAGNOSIS — E099 Drug or chemical induced diabetes mellitus without complications: Secondary | ICD-10-CM | POA: Diagnosis not present

## 2024-05-17 DIAGNOSIS — G8929 Other chronic pain: Secondary | ICD-10-CM | POA: Diagnosis present

## 2024-05-17 MED ORDER — HYDROCODONE-ACETAMINOPHEN 5-325 MG PO TABS
1.0000 | ORAL_TABLET | Freq: Three times a day (TID) | ORAL | 0 refills | Status: DC | PRN
Start: 2024-06-16 — End: 2024-07-04
  Filled 2024-06-19: qty 90, 30d supply, fill #0

## 2024-05-17 MED ORDER — HYDROCODONE-ACETAMINOPHEN 5-325 MG PO TABS
1.0000 | ORAL_TABLET | Freq: Three times a day (TID) | ORAL | 0 refills | Status: DC | PRN
  Filled 2024-05-17: qty 90, 30d supply, fill #0

## 2024-05-17 MED ORDER — METHOCARBAMOL 750 MG PO TABS
750.0000 mg | ORAL_TABLET | Freq: Two times a day (BID) | ORAL | 0 refills | Status: DC | PRN
  Filled 2024-05-17 – 2024-06-08 (×2): qty 60, 30d supply, fill #0

## 2024-05-17 NOTE — Progress Notes (Signed)
 PROVIDER NOTE: Interpretation of information contained herein should be left to medically-trained personnel. Specific patient instructions are provided elsewhere under Patient Instructions section of medical record. This document was created in part using AI and STT-dictation technology, any transcriptional errors that may result from this process are unintentional.  Patient: Jamie Coleman  Service: E/M   PCP: Randeen Laine LABOR, MD  DOB: 1971-07-04  DOS: 05/17/2024  Provider: Wallie Sherry, MD  MRN: [id number]  Delivery: Face-to-face  Specialty: Interventional Pain Management  Type: Established Patient  Setting: Ambulatory outpatient facility  Specialty designation: 09  Referring Prov.: Tower, Laine LABOR, MD  Location: Outpatient office facility       Primary Reason(s) for Visit: Encounter for evaluation before starting new chronic pain management plan of care (Level of risk: moderate) CC: Leg Pain (bilat) and Back Pain  HPI  Jamie Coleman is a 53 y.o. year old, female patient, who comes today for a follow-up evaluation to review the test results and decide on a treatment plan. She has DERMATOFIBROMA, ARM; NEOPLASM, SKIN, UNCERTAIN BEHAVIOR; Hyperlipidemia; Anxiety disorder; Allergic rhinitis; OTHER&UNSPECIFIED DISEASES THE ORAL SOFT TISSUES; GERD; IBS; Sebaceous cyst; Tremor; Steroid-induced diabetes mellitus (HCC); Hemorrhoids; Pupil asymmetry; Vitamin D  deficiency; Fatigue; Elevated liver transaminase level; Subclinical hypothyroidism; Bronchitis due to COVID-19 virus; Acute liver failure without hepatic coma; Cirrhosis, alcoholic (HCC); Macrocytosis without anemia; Acute on chronic alcoholic liver disease (HCC); Decompensation of cirrhosis of liver (HCC); History of alcoholic hepatitis; Estrogen deficiency; Multiple joint pain; Menopause; H/O liver transplant (HCC); On prednisone  therapy; Abdominal weakness; Osteopenia; Low back pain; Thrush; Liver transplant rejection (HCC); Elevated blood pressure  reading; Lumbar spinal stenosis; and Lumbar radiculopathy on their problem list. Her primarily concern today is the Leg Pain (bilat) and Back Pain  Pain Assessment: Location: Lower Back Radiating: through hips bilat, down legs to feet Onset: More than a month ago Duration: Chronic pain Quality: Shooting, Sharp, Tightness, Other (Comment) (grabbing) Severity: 6 /10 (subjective, self-reported pain score)  Effect on ADL: worse when standing Timing: Constant Modifying factors: epidurals BP: (!) 114/97  HR: (!) 111  Ms. Wiatrek comes in today for a follow-up visit after her initial evaluation on 05/11/2024. Today we went over the results of her tests. These were explained in Layman's terms. During today's appointment we went over my diagnostic impression, as well as the proposed treatment plan.  Review of initial evaluation (05/11/2024): History of Present Illness   Jamie Coleman is a 53 year old female with chronic low back pain who presents for pain management. She was referred by an orthopedic specialist for pain management and physical therapy.   She experiences chronic low back pain with right-sided predominance and radiation to the right leg. The pain has been severe enough to disrupt sleep and daily activities prior to receiving back injections. She has undergone two sets of bilateral L3, L4 transforaminal epidural steroid injections, with the first injection on January 27, 2024, and the second on April 06, 2024. The first injection significantly reduced her pain, allowing her to sleep and move more comfortably, while the second injection alleviated pain in her left leg and back tightness temporarily. However, the pain in her left calf has returned.   She has a history of liver transplant due to cirrhosis secondary to alcohol  use and COVID-19, with three episodes of rejection post-transplant. She is unable to take NSAIDs due to her liver condition but can take up to 2000 mg of Tylenol   daily. She is currently on gabapentin  400 mg  three times a day, which helps but causes sedation. She has also tried Lyrica , which caused 'weird dreams,' and oxycodone , which dulled the pain but also caused tiredness. Muscle relaxants like tizanidine  and methocarbamol  have been used, with methocarbamol  being more effective but also causing sedation.   Her family history is significant for back issues, with her father, sister, and brother having undergone back surgeries or injections, primarily at the L2, L3 levels. Her cousin has also had back surgery.   No burning or tingling in her feet related to diabetes, which is steroid-induced and has been present for just over a year.      Controlled Substance Pharmacotherapy Assessment REMS (Risk Evaluation and Mitigation Strategy)  Patient to sign controlled substance agreement, initiate hydrocodone  as below Pill Count: None expected due to no prior prescriptions written by our practice. Dayna Pulling, RN  05/17/2024  2:25 PM  Sign when Signing Visit Safety precautions to be maintained throughout the outpatient stay will include: orient to surroundings, keep bed in low position, maintain call bell within reach at all times, provide assistance with transfer out of bed and ambulation.   Undesirable effects: Side-effects or Adverse reactions: None reported Monitoring: [place] PMP: PDMP reviewed during this encounter. Online review of the past 71-month period previously conducted. Not applicable at this point since we have not taken over the patient's medication management yet. List of other Serum/Urine Drug Screening Test(s):  Lab Results  Component Value Date   COCAINSCRNUR NONE DETECTED 09/01/2022   COCAINSCRNUR NONE DETECTED 09/17/2021   THCU NONE DETECTED 09/01/2022   THCU NONE DETECTED 09/17/2021   ETH <10 09/02/2022   List of all UDS test(s) done:  Lab Results  Component Value Date   SUMMARY FINAL 05/11/2024   Last UDS on record: Summary   Date Value Ref Range Status  05/11/2024 FINAL  Final    Comment:    ==================================================================== Compliance Drug Analysis, Ur ==================================================================== Test                             Result       Flag       Units  Drug Present and Declared for Prescription Verification   Gabapentin                      PRESENT      EXPECTED   Methocarbamol                   PRESENT      EXPECTED  Drug Present not Declared for Prescription Verification   Acetaminophen                   PRESENT      UNEXPECTED   Diphenhydramine                 PRESENT      UNEXPECTED  Drug Absent but Declared for Prescription Verification   Lorazepam                       Not Detected UNEXPECTED ng/mg creat   Tramadol                        Not Detected UNEXPECTED ng/mg creat   Salicylate                     Not Detected UNEXPECTED    Aspirin, as indicated in  the declared medication list, is not always    detected even when used as directed.  ==================================================================== Test                      Result    Flag   Units      Ref Range   Creatinine              160              mg/dL      >=79 ==================================================================== Declared Medications:  The flagging and interpretation on this report are based on the  following declared medications.  Unexpected results may arise from  inaccuracies in the declared medications.   **Note: The testing scope of this panel includes these medications:   Gabapentin   Lorazepam   Methocarbamol   Tramadol    **Note: The testing scope of this panel does not include small to  moderate amounts of these reported medications:   Aspirin   **Note: The testing scope of this panel does not include the  following reported medications:   Calcium   Clotrimazole   Eye Drops  Filgrastim  (Nivestym )  Insulin   Mycophenolate  mofetil  (Cellcept )  Omeprazole  Prednisone   Sulfamethoxazole  (Bactrim )  Tacrolimus   Tbo-filgrastim   Trimethoprim  (Bactrim )  Valganciclovir   Vitamin D  ==================================================================== For clinical consultation, please call [phone]. ====================================================================    UDS interpretation: No unexpected findings.          Medication Assessment Form: Not applicable. No opioids. Treatment compliance: Not applicable Risk Assessment Profile: Aberrant behavior: See initial evaluations. None observed or detected today Comorbid factors increasing risk of overdose: See initial evaluation. No additional risks detected today Opioid risk tool (ORT):      No data to display          ORT Scoring interpretation table:  Score <3 = Low Risk for SUD  Score between 4-7 = Moderate Risk for SUD  Score >8 = High Risk for Opioid Abuse   Risk of substance use disorder (SUD): Low  Risk Mitigation Strategies:  Patient opioid safety counseling: Completed today. Counseling provided to patient as per Patient Counseling Document. Document signed by patient, attesting to counseling and understanding Patient-Prescriber Agreement (PPA): Obtained today.  Controlled substance notification to other providers: Written and sent today.  Pharmacologic Plan: The patient does have confirmed pathology for which the use of opioid analgesics may be justified. From this point on, Ms. Peifer's medication agreement should be considered active.  Laboratory Chemistry Profile   Renal Lab Results  Component Value Date   BUN 43 (H) 09/25/2022   CREATININE 1.2 (A) 03/09/2024   GFR 34.93 (L) 09/25/2022   GFRAA >60 07/15/2020   GFRNONAA >60 09/22/2022   PHUR 6.0 08/19/2016   PROTEINUR NEGATIVE 09/21/2022     Electrolytes Lab Results  Component Value Date   NA 122 (L) 09/25/2022   K 5.3 (H) 09/25/2022   CL 89 (L) 09/25/2022   CALCIUM  9.4  09/25/2022   MG 2.4 09/07/2022   PHOS 3.1 09/17/2021     Hepatic Lab Results  Component Value Date   AST 33 03/09/2024   ALT 61 (A) 03/09/2024   ALBUMIN  3.0 (L) 09/25/2022   ALKPHOS 300 (H) 09/25/2022   LIPASE 59 (H) 09/21/2022   AMMONIA 66 (H) 09/25/2022     ID Lab Results  Component Value Date   HIV Non Reactive 09/21/2022   SARSCOV2NAA NEGATIVE 09/17/2021   STAPHAUREUS NEGATIVE 06/05/2011   MRSAPCR NEGATIVE 06/05/2011   HCVAB  NON REACTIVE 09/18/2021   PREGTESTUR NEGATIVE 06/09/2011     Bone Lab Results  Component Value Date   VD25OH 62.42 08/30/2019     Endocrine Lab Results  Component Value Date   GLUCOSE 116 (H) 09/25/2022   GLUCOSEU 50 (A) 09/21/2022   HGBA1C 6.9 (A) 04/29/2023   TSH 1.29 02/15/2023   FREET4 0.63 02/15/2023     Neuropathy Lab Results  Component Value Date   VITAMINB12 1,940 (H) 09/02/2022   FOLATE 12.1 09/02/2022   HGBA1C 6.9 (A) 04/29/2023   HIV Non Reactive 09/21/2022     CNS No results found for: COLORCSF, APPEARCSF, RBCCOUNTCSF, WBCCSF, POLYSCSF, LYMPHSCSF, EOSCSF, PROTEINCSF, GLUCCSF, JCVIRUS, CSFOLI, IGGCSF, LABACHR, ACETBL   Inflammation (CRP: Acute  ESR: Chronic) Lab Results  Component Value Date   CRP 20.1 (H) 09/03/2022   ESRSEDRATE 5 02/15/2023   LATICACIDVEN 1.2 09/21/2022     Rheumatology Lab Results  Component Value Date   RF <10 02/15/2023   ANA NEGATIVE 02/15/2023     Coagulation Lab Results  Component Value Date   INR 2.5 (H) 09/25/2022   LABPROT 25.8 (H) 09/25/2022   APTT 24.3 10/16/2011   PLT 194.0 Repeated and verified X2. 09/25/2022   DDIMER <0.22 05/04/2012     Cardiovascular Lab Results  Component Value Date   BNP 154.9 (H) 09/02/2022   CKTOTAL 36 (L) 09/01/2022   HGB 11.7 (A) 03/09/2024   HCT 36.1 09/25/2022     Screening Lab Results  Component Value Date   SARSCOV2NAA NEGATIVE 09/17/2021   STAPHAUREUS NEGATIVE 06/05/2011   MRSAPCR NEGATIVE 06/05/2011    HCVAB NON REACTIVE 09/18/2021   HIV Non Reactive 09/21/2022   PREGTESTUR NEGATIVE 06/09/2011     Cancer No results found for: CEA, CA125, LABCA2   Allergens No results found for: ALMOND, APPLE, ASPARAGUS, AVOCADO, BANANA, BARLEY, BASIL, BAYLEAF, GREENBEAN, LIMABEAN, WHITEBEAN, BEEFIGE, REDBEET, BLUEBERRY, BROCCOLI, CABBAGE, MELON, CARROT, CASEIN, CASHEWNUT, CAULIFLOWER, CELERY     Note: Lab results reviewed.  Recent Diagnostic Imaging Review  Cervical Imaging:  DG Ankle Complete Right  Narrative FINDINGS CLINICAL DATA:    PAIN AND SWELLING.  THE PATIENT TWISTED ANKLE AND NOW COMPLAINS OF PAIN OVER THE LATERAL MALLEOLUS. THREE VIEWS OF THE RIGHT ANKLE - 10/11/01 FINDINGS: THREE VIEWS OF THE RIGHT ANKLE WERE OBTAINED.  THERE IS MILD SOFT TISSUE SWELLING OVER THE LATERAL MALLEOLUS.  THERE IS PROBABLY AN OS TRIGONUM SEEN ON THE LATERAL VIEW.  THERE IS NO DEFINITE EVIDENCE OF A FRACTURE OR DISLOCATION. IMPRESSION 1.   PROBABLE OS TRIGONUM VISUALIZED ON THE LATERAL VIEW.  CORRELATION WITH PAIN IN THIS AREA IS RECOMMENDED. 2.   NO DEFINITE RADIOGRAPHIC EVIDENCE FOR AN ACUTE FRACTURE OR DISLOCATION.    Complexity Note: Imaging results reviewed.                         Meds   Current Outpatient Medications:    aspirin 81 MG chewable tablet, Chew by mouth daily., Disp: , Rfl:    Calcium  Citrate-Vitamin D  315-5 MG-MCG TABS, Take by mouth., Disp: , Rfl:    clotrimazole  (MYCELEX ) 10 MG troche, Take 1 tablet (10 mg total) by mouth 2 (two) times daily. Dissolve tablet(troche) slowly and completely in mouth., Disp: 60 tablet, Rfl: 2   Continuous Glucose Sensor (FREESTYLE LIBRE 2 PLUS SENSOR) MISC, Use 1 each as directed, Disp: 6 each, Rfl: 3   filgrastim -aafi (NIVESTYM ) 480 MCG/0.8ML SOSY injection, Inject 0.8 mLs (480 mcg total) subcutaneously once  daily, Disp: 0.8 mL, Rfl: 3   gabapentin  (NEURONTIN ) 100 MG capsule, Take 1 capsule (100 mg  total) by mouth 3 (three) times daily. In addition to the 300 mg pill, Disp: 90 capsule, Rfl: 3   gabapentin  (NEURONTIN ) 300 MG capsule, Take 1 capsule (300 mg total) by mouth 3 (three) times daily., Disp: 90 capsule, Rfl: 11   glucose blood (PRECISION QID TEST) test strip, Use 1 strip to test 3 (three) times daily, Disp: 100 each, Rfl: 1   HYDROcodone -acetaminophen  (NORCO/VICODIN) 5-325 MG tablet, Take 1 tablet by mouth 3 (three) times daily as needed for severe pain (pain score 7-10). Must last 30 days, Disp: 90 tablet, Rfl: 0   [START ON 06/16/2024] HYDROcodone -acetaminophen  (NORCO/VICODIN) 5-325 MG tablet, Take 1 tablet by mouth 3 (three) times daily as needed for severe pain (pain score 7-10). Must last 30 days, Disp: 90 tablet, Rfl: 0   insulin  lispro (HUMALOG ) 100 UNIT/ML KwikPen, Inject 28 units with breakfast, 28 units with lunch, and 22 units with dinner in addition to sliding scale.  Max daily dose 110 units., Disp: 36 mL, Rfl: 5   Insulin  Pen Needle 31G X 5 MM MISC, Use as directed, Disp: 100 each, Rfl: 3   Insulin  Pen Needle 31G X 5 MM MISC, Use as directed, Disp: 100 each, Rfl: 5   Lancets (FREESTYLE) lancets, use as directed three times daily., Disp: 100 each, Rfl: 1   LORazepam  (ATIVAN ) 1 MG tablet, Take 1 tablet (1 mg total) by mouth 2 (two) times daily as needed for anxiety. Caution of sedation, Disp: 30 tablet, Rfl: 0   mycophenolate  (CELLCEPT ) 250 MG capsule, Take 6 capsules (1,500 mg total) by mouth every 12 (twelve) hours., Disp: 360 capsule, Rfl: 11   omeprazole (PRILOSEC) 20 MG capsule, Take 20 mg by mouth daily., Disp: , Rfl:    Polyvinyl Alcohol -Povidone (REFRESH OP), Place 1-2 drops into both eyes 4 (four) times daily as needed (dry eye)., Disp: , Rfl:    predniSONE  (DELTASONE ) 5 MG tablet, Take 2 tablets (10 mg total) by mouth once daily., Disp: 75 tablet, Rfl: 3   sulfamethoxazole -trimethoprim  (BACTRIM  DS) 800-160 MG tablet, Take 1 tablet by mouth every Monday, Wednesday, and  Friday, Disp: 12 tablet, Rfl: 2   tacrolimus  (PROGRAF ) 1 MG capsule, Take 2 capsules (2 mg total) by mouth every morning AND 1 capsule (1 mg total) at bedtime., Disp: 2 capsule, Rfl: 0   Tbo-Filgrastim  (GRANIX ) 480 MCG/0.8ML SOSY injection, Inject 0.73 mLs (438 mcg total) subcutaneously once daily, Disp: 0.8 mL, Rfl: 3   traMADol  (ULTRAM ) 50 MG tablet, Take 1 tablet (50 mg total) by mouth every 12 (twelve) hours as needed. Caution of sedation, Disp: 30 tablet, Rfl: 0   valGANciclovir  (VALCYTE ) 450 MG tablet, Take 2 tablets (900 mg total) by mouth daily with supper., Disp: 60 tablet, Rfl: 2   [START ON 05/31/2024] methocarbamol  (ROBAXIN ) 750 MG tablet, Take 1 tablet (750 mg total) by mouth every 12 (twelve) hours as needed for muscle spasms., Disp: 60 tablet, Rfl: 0  ROS  Constitutional: Denies any fever or chills Gastrointestinal: No reported hemesis, hematochezia, vomiting, or acute GI distress Musculoskeletal: Denies any acute onset joint swelling, redness, loss of ROM, or weakness Neurological: No reported episodes of acute onset apraxia, aphasia, dysarthria, agnosia, amnesia, paralysis, loss of coordination, or loss of consciousness  Allergies  Ms. Bushee is allergic to lipitor [atorvastatin ].  PFSH  Drug: Ms. Hammontree  reports no history of drug use. Alcohol :  reports  that she does not currently use alcohol  after a past usage of about 14.0 standard drinks of alcohol  per week. Tobacco:  reports that she quit smoking about 40 years ago. Her smoking use included cigarettes. She has never used smokeless tobacco. Medical:  has a past medical history of Allergy, Anxiety, COVID (05/2021), Difficult intubation, Elevated hemoglobin A1c (08/08/2015), Elevated liver function tests (08/08/2015), GERD (gastroesophageal reflux disease), Hyperlipidemia, Low serum vitamin D  (08/08/2015), Status post endometrial ablation, and Tennis elbow. Surgical: Ms. Gaugh  has a past surgical history that  includes Cesarean section; Foot surgery (Left); Tonsillectomy; Endometrial ablation; Laparoscopic tubal ligation (06/09/2011); Ligament repair (Left, 05/08/2016); Extremity wire/pin removal (Left, 07/16/2016); Colonoscopy with propofol  (N/A, 02/09/2022); Esophagogastroduodenoscopy (egd) with propofol  (N/A, 02/09/2022); biopsy (02/09/2022); polypectomy (02/09/2022); and Liver transplant (09/2022). Family: family history includes Alzheimer's disease in her father; Cancer in her mother; Colon polyps in her father; Diabetes in her brother, father, maternal grandfather, paternal grandfather, and paternal grandmother; Heart disease in her father.  Constitutional Exam  General appearance: Well nourished, well developed, and well hydrated. In no apparent acute distress Vitals:   05/17/24 1429  BP: (!) 114/97  Pulse: (!) 111  Resp: 16  Temp: (!) 97.4 F (36.3 C)  SpO2: 99%  Weight: 200 lb (90.7 kg)  Height: 5' 1.5 (1.562 m)   BMI Assessment: Estimated body mass index is 37.18 kg/m as calculated from the following:   Height as of this encounter: 5' 1.5 (1.562 m).   Weight as of this encounter: 200 lb (90.7 kg).  BMI interpretation table: BMI level Category Range association with higher incidence of chronic pain  <18 kg/m2 Underweight   18.5-24.9 kg/m2 Ideal body weight   25-29.9 kg/m2 Overweight Increased incidence by 20%  30-34.9 kg/m2 Obese (Class I) Increased incidence by 68%  35-39.9 kg/m2 Severe obesity (Class II) Increased incidence by 136%  >40 kg/m2 Extreme obesity (Class III) Increased incidence by 254%   Patient's current BMI Ideal Body weight  Body mass index is 37.18 kg/m. Ideal body weight: 48.9 kg (107 lb 14.6 oz) Adjusted ideal body weight: 65.7 kg (144 lb 12 oz)   BMI Readings from Last 4 Encounters:  05/17/24 37.18 kg/m  05/11/24 37.18 kg/m  04/25/24 36.27 kg/m  03/14/24 34.67 kg/m   Wt Readings from Last 4 Encounters:  05/17/24 200 lb (90.7 kg)  05/11/24 200  lb (90.7 kg)  04/25/24 201 lb 8 oz (91.4 kg)  02/09/24 192 lb 9.6 oz (87.4 kg)    Psych/Mental status: Alert, oriented x 3 (person, place, & time)       Eyes: PERLA Respiratory: No evidence of acute respiratory distress Lumbar Spine Area Exam  Skin & Axial Inspection: No masses, redness, or swelling Alignment: Symmetrical Functional ROM: Limited ROM       Stability: No instability detected Muscle Tone/Strength: Functionally intact. No obvious neuro-muscular anomalies detected. Sensory (Neurological): Dermatomal pain pattern Palpation: No palpable anomalies       Provocative Tests: Hyperextension/rotation test: (+) due to pain. Lumbar quadrant test (Kemp's test): (+) bilateral for foraminal stenosis   Gait & Posture Assessment  Ambulation: Unassisted Gait: Relatively normal for age and body habitus Posture: WNL  Lower Extremity Exam      Side: Right lower extremity   Side: Left lower extremity  Stability: No instability observed           Stability: No instability observed          Skin & Extremity Inspection: Skin color, temperature, and hair  growth are WNL. No peripheral edema or cyanosis. No masses, redness, swelling, asymmetry, or associated skin lesions. No contractures.   Skin & Extremity Inspection: Skin color, temperature, and hair growth are WNL. No peripheral edema or cyanosis. No masses, redness, swelling, asymmetry, or associated skin lesions. No contractures.  Functional ROM: Pain restricted ROM for all joints of the lower extremity           Functional ROM: Pain restricted ROM for all joints of the lower extremity          Muscle Tone/Strength: Functionally intact. No obvious neuro-muscular anomalies detected.   Muscle Tone/Strength: Functionally intact. No obvious neuro-muscular anomalies detected.  Sensory (Neurological): Unimpaired         Sensory (Neurological): Unimpaired        DTR: Patellar: deferred today Achilles: deferred today Plantar: deferred today    DTR: Patellar: deferred today Achilles: deferred today Plantar: deferred today  Palpation: No palpable anomalies   Palpation: No palpable anomalies     Assessment & Plan  Primary Diagnosis & Pertinent Problem List: The primary encounter diagnosis was Chronic radicular lumbar pain. Diagnoses of H/O liver transplant [facility]), Lumbar radiculopathy, Pain management contract signed, History of alcoholic hepatitis, Steroid-induced diabetes mellitus, initial encounter (HCC), and Chronic pain syndrome were also pertinent to this visit. Visit Diagnosis: 1. Chronic radicular lumbar pain   2. H/O liver transplant (HCC)   3. Lumbar radiculopathy   4. Pain management contract signed   5. History of alcoholic hepatitis   6. Steroid-induced diabetes mellitus, initial encounter (HCC)   7. Chronic pain syndrome     Plan of Care  UDS appropriate, patient to sign controlled substance agreement discussed safe intake of hydrocodone , as needed continue multimodal analgesics including gabapentin , Robaxin   Assessment and plan from 05/17/2024 Chronic low back pain with bilateral lower extremity radiculopathy   Chronic low back pain with bilateral lower extremity radiculopathy is more severe on the right side. Pain improved with previous L3-L4 transforaminal epidural steroid injections but has not completely resolved. Current management includes gabapentin , oxycodone , and muscle relaxants, which cause sedation. Surgery is not an option due to liver stability issues, and she seeks pain management until surgery is possible. Her liver transplant and steroid-induced hyperglycemia limit medication options. Discussed using a Butrans patch instead of oxycodone , but it requires transplant clinic approval. Hydrocodone  or Percocet are considered alternatives, with a maximum acetaminophen  intake of 2000 mg per day. Perform a urine toxicology screen today. Follow up next Wednesday to review results and discuss medication management.  Consider hydrocodone  or Percocet for pain management, pending urine screen results. Schedule another back injection if needed, based on medication efficacy. Refer to neurosurgeons at [facility] for a second opinion when closer to surgery eligibility.  Pharmacotherapy (Medications Ordered): Meds ordered this encounter  Medications   HYDROcodone -acetaminophen  (NORCO/VICODIN) 5-325 MG tablet    Sig: Take 1 tablet by mouth 3 (three) times daily as needed for severe pain (pain score 7-10). Must last 30 days    Dispense:  90 tablet    Refill:  0    Chronic Pain: STOP Act (Not applicable) Fill 1 day early if closed on refill date. Avoid benzodiazepines within 8 hours of opioids   HYDROcodone -acetaminophen  (NORCO/VICODIN) 5-325 MG tablet    Sig: Take 1 tablet by mouth 3 (three) times daily as needed for severe pain (pain score 7-10). Must last 30 days    Dispense:  90 tablet    Refill:  0    Chronic Pain:  STOP Act (Not applicable) Fill 1 day early if closed on refill date. Avoid benzodiazepines within 8 hours of opioids   methocarbamol  (ROBAXIN ) 750 MG tablet    Sig: Take 1 tablet (750 mg total) by mouth every 12 (twelve) hours as needed for muscle spasms.    Dispense:  60 tablet    Refill:  0      Provider-requested follow-up: Return in about 8 weeks (around 07/12/2024) for MM, F2F. Recent Visits Date Type Provider Dept  05/11/24 Office Visit Marcelino Nurse, MD [facility]  Showing recent visits within past 90 days and meeting all other requirements Today's Visits Date Type Provider Dept  05/17/24 Office Visit Marcelino Nurse, MD [facility]  Showing today's visits and meeting all other requirements Future Appointments Date Type Provider Dept  07/11/24 Appointment Marcelino Nurse, MD [facility]  Showing future appointments within next 90 days and meeting all other requirements   Primary Care Physician: Tower, Laine LABOR, MD  Duration of encounter: .  Total  time on encounter, as per AMA guidelines included both the face-to-face and non-face-to-face time personally spent by the physician and/or other qualified health care professional(s) on the day of the encounter (includes time in activities that require the physician or other qualified health care professional and does not include time in activities normally performed by clinical staff). Physician's time may include the following activities when performed: Preparing to see the patient (e.g., pre-charting review of records, searching for previously ordered imaging, lab work, and nerve conduction tests) Review of prior analgesic pharmacotherapies. Reviewing PMP Interpreting ordered tests (e.g., lab work, imaging, nerve conduction tests) Performing post-procedure evaluations, including interpretation of diagnostic procedures Obtaining and/or reviewing separately obtained history Performing a medically appropriate examination and/or evaluation Counseling and educating the patient/family/caregiver Ordering medications, tests, or procedures Referring and communicating with other health care professionals (when not separately reported) Documenting clinical information in the electronic or other health record Independently interpreting results (not separately reported) and communicating results to the patient/ family/caregiver Care coordination (not separately reported)  Note by: Nurse Marcelino, MD (TTS technology used. I apologize for any typographical errors that were not detected and corrected.) Date: 05/17/2024; Time: 3:27 PM

## 2024-05-17 NOTE — Patient Instructions (Signed)
 Please sign controlled substance agreement

## 2024-05-17 NOTE — Progress Notes (Signed)
 Safety precautions to be maintained throughout the outpatient stay will include: orient to surroundings, keep bed in low position, maintain call bell within reach at all times, provide assistance with transfer out of bed and ambulation.

## 2024-05-19 ENCOUNTER — Other Ambulatory Visit (HOSPITAL_COMMUNITY): Payer: Self-pay

## 2024-05-23 DIAGNOSIS — Z944 Liver transplant status: Secondary | ICD-10-CM | POA: Diagnosis not present

## 2024-05-23 DIAGNOSIS — D849 Immunodeficiency, unspecified: Secondary | ICD-10-CM | POA: Diagnosis not present

## 2024-05-23 DIAGNOSIS — B259 Cytomegaloviral disease, unspecified: Secondary | ICD-10-CM | POA: Diagnosis not present

## 2024-05-24 ENCOUNTER — Other Ambulatory Visit: Payer: Self-pay

## 2024-05-24 ENCOUNTER — Other Ambulatory Visit (HOSPITAL_COMMUNITY): Payer: Self-pay

## 2024-05-24 MED ORDER — PREDNISONE 5 MG PO TABS
7.5000 mg | ORAL_TABLET | Freq: Every day | ORAL | 2 refills | Status: DC
  Filled 2024-05-24: qty 45, 30d supply, fill #0

## 2024-05-24 MED ORDER — TACROLIMUS 1 MG PO CAPS
1.0000 mg | ORAL_CAPSULE | Freq: Two times a day (BID) | ORAL | 11 refills | Status: DC
  Filled 2024-05-24: qty 60, 30d supply, fill #0

## 2024-05-25 ENCOUNTER — Other Ambulatory Visit (HOSPITAL_COMMUNITY): Payer: Self-pay

## 2024-05-25 ENCOUNTER — Other Ambulatory Visit: Payer: Self-pay

## 2024-05-25 MED ORDER — VALGANCICLOVIR HCL 450 MG PO TABS
900.0000 mg | ORAL_TABLET | Freq: Every day | ORAL | 2 refills | Status: DC
  Filled 2024-05-25: qty 60, 30d supply, fill #0

## 2024-06-06 ENCOUNTER — Other Ambulatory Visit (HOSPITAL_COMMUNITY): Payer: Self-pay

## 2024-06-06 DIAGNOSIS — D849 Immunodeficiency, unspecified: Secondary | ICD-10-CM | POA: Diagnosis not present

## 2024-06-06 DIAGNOSIS — Z944 Liver transplant status: Secondary | ICD-10-CM | POA: Diagnosis not present

## 2024-06-06 DIAGNOSIS — B259 Cytomegaloviral disease, unspecified: Secondary | ICD-10-CM | POA: Diagnosis not present

## 2024-06-07 DIAGNOSIS — F1010 Alcohol abuse, uncomplicated: Secondary | ICD-10-CM | POA: Diagnosis not present

## 2024-06-07 DIAGNOSIS — F419 Anxiety disorder, unspecified: Secondary | ICD-10-CM | POA: Diagnosis not present

## 2024-06-07 DIAGNOSIS — D849 Immunodeficiency, unspecified: Secondary | ICD-10-CM | POA: Diagnosis not present

## 2024-06-07 DIAGNOSIS — Z79624 Long term (current) use of inhibitors of nucleotide synthesis: Secondary | ICD-10-CM | POA: Diagnosis not present

## 2024-06-07 DIAGNOSIS — B259 Cytomegaloviral disease, unspecified: Secondary | ICD-10-CM | POA: Diagnosis not present

## 2024-06-07 DIAGNOSIS — Z79621 Long term (current) use of calcineurin inhibitor: Secondary | ICD-10-CM | POA: Diagnosis not present

## 2024-06-07 DIAGNOSIS — T8641 Liver transplant rejection: Secondary | ICD-10-CM | POA: Diagnosis not present

## 2024-06-07 DIAGNOSIS — D709 Neutropenia, unspecified: Secondary | ICD-10-CM | POA: Diagnosis not present

## 2024-06-07 DIAGNOSIS — T380X5A Adverse effect of glucocorticoids and synthetic analogues, initial encounter: Secondary | ICD-10-CM | POA: Diagnosis not present

## 2024-06-07 DIAGNOSIS — K7030 Alcoholic cirrhosis of liver without ascites: Secondary | ICD-10-CM | POA: Diagnosis not present

## 2024-06-07 DIAGNOSIS — R739 Hyperglycemia, unspecified: Secondary | ICD-10-CM | POA: Diagnosis not present

## 2024-06-08 ENCOUNTER — Other Ambulatory Visit: Payer: Self-pay

## 2024-06-08 ENCOUNTER — Other Ambulatory Visit (HOSPITAL_COMMUNITY): Payer: Self-pay

## 2024-06-08 MED ORDER — TACROLIMUS 1 MG PO CAPS
2.0000 mg | ORAL_CAPSULE | Freq: Two times a day (BID) | ORAL | 11 refills | Status: DC
  Filled 2024-06-08: qty 120, 30d supply, fill #0

## 2024-06-08 MED ORDER — VALGANCICLOVIR HCL 450 MG PO TABS
450.0000 mg | ORAL_TABLET | Freq: Every day | ORAL | 2 refills | Status: DC
  Filled 2024-06-08 – 2024-06-25 (×2): qty 60, 60d supply, fill #0

## 2024-06-08 MED ORDER — PREDNISONE 5 MG PO TABS
5.0000 mg | ORAL_TABLET | Freq: Every day | ORAL | 2 refills | Status: DC
  Filled 2024-06-08: qty 45, 45d supply, fill #0

## 2024-06-13 ENCOUNTER — Other Ambulatory Visit (HOSPITAL_COMMUNITY): Payer: Self-pay

## 2024-06-13 ENCOUNTER — Ambulatory Visit (INDEPENDENT_AMBULATORY_CARE_PROVIDER_SITE_OTHER): Admitting: Family Medicine

## 2024-06-13 ENCOUNTER — Encounter: Payer: Self-pay | Admitting: Family Medicine

## 2024-06-13 VITALS — BP 118/76 | HR 86 | Temp 98.6°F | Ht 61.5 in | Wt 203.2 lb

## 2024-06-13 DIAGNOSIS — R21 Rash and other nonspecific skin eruption: Secondary | ICD-10-CM | POA: Diagnosis not present

## 2024-06-13 DIAGNOSIS — L988 Other specified disorders of the skin and subcutaneous tissue: Secondary | ICD-10-CM | POA: Diagnosis not present

## 2024-06-13 MED ORDER — TRIAMCINOLONE ACETONIDE 0.1 % EX CREA
1.0000 | TOPICAL_CREAM | Freq: Two times a day (BID) | CUTANEOUS | 0 refills | Status: DC
  Filled 2024-06-13: qty 30, 15d supply, fill #0

## 2024-06-13 NOTE — Assessment & Plan Note (Signed)
 Small shiny smooth plaque just distal to left elbow on dorsal forearm , some pink to purple (hyperpigmented) color  Not bothersome   Liver transplant patient- going down gradually on her prednisone    Suspect atopic dermatitis vs early psoriasis based on appearance   Prescription triamcinolone  cream 0.1% to use small amt bid  Update if not starting to improve in a week or if worsening   Call back and Er precautions noted in detail today

## 2024-06-13 NOTE — Patient Instructions (Addendum)
 Avoid the sunscreen you used in [place]  - try a different brand when needed   Avoid fragrances/ harsh detergents and hot water  Use dove soap for sensitive skin  Use a clothing detergent with word free in it  Avoid fabric softeners   If rash does not improve in next several weeks let us  know   For the skin bump on your left arm  Try the triamcinolone  cream and if not significantly improved in 1-2 weeks let us  know

## 2024-06-13 NOTE — Assessment & Plan Note (Signed)
 On sun exposed areas Started at [employer] using sunscreen product she does not usually use Gradually improving with time Reassuring exam- resembles contact derm  Encouraged to Keep cool Avoid using that sunscreen (banana boat) in future Avoid sun exposure  Avoid hot water/harsh detergents  Use scent free products Update if no further improvement

## 2024-06-13 NOTE — Progress Notes (Signed)
 Subjective:    Patient ID: Jamie Coleman, female    DOB: 1970/12/02, 53 y.o.   MRN: [id number]  HPI  Wt Readings from Last 3 Encounters:  06/13/24 203 lb 4 oz (92.2 kg)  05/17/24 200 lb (90.7 kg)  05/11/24 200 lb (90.7 kg)   37.78 kg/m  Vitals:   06/13/24 1130  BP: 118/76  Pulse: 86  Temp: 98.6 F (37 C)  SpO2: 98%    Here for lump /left elbow  Also rash   Left elbow area - 2 weeks  Bump  Distal elbow Has a brown spot Getting bigger Does not hurt or itch   No trauma that she knows of  No insect bite  She did show it to her liver problem   [place]  [place]   Seeing Dr Marcelino for pain management  Norco  Limited due to tylenol      Patient Active Problem List   Diagnosis Date Noted   Skin plaque 06/13/2024   Lumbar radiculopathy 05/11/2024   Lumbar spinal stenosis 04/25/2024   Elevated blood pressure reading 03/14/2024   Liver transplant rejection (HCC) 02/09/2024   Low back pain 09/22/2023   Thrush 09/22/2023   Osteopenia 09/19/2023   On prednisone  therapy 04/29/2023   Abdominal weakness 04/29/2023   Multiple joint pain 02/15/2023   Menopause 02/15/2023   H/O liver transplant (HCC) 02/15/2023   Estrogen deficiency 09/24/2022   Acute on chronic alcoholic liver disease (HCC) 09/22/2022   Decompensation of cirrhosis of liver (HCC) 09/22/2022   History of alcoholic hepatitis 09/22/2022   Cirrhosis, alcoholic (HCC) 09/02/2022   Macrocytosis without anemia 09/02/2022   Acute liver failure without hepatic coma 09/01/2022   Bronchitis due to COVID-19 virus 05/05/2022   Elevated liver transaminase level 09/03/2019   Subclinical hypothyroidism 09/03/2019   Vitamin D  deficiency 03/02/2018   Fatigue 03/02/2018   Pupil asymmetry 08/04/2016   Hemorrhoids 12/28/2014   Steroid-induced diabetes mellitus (HCC) 08/28/2014   Rash 08/26/2014   Tremor 06/30/2013   Sebaceous cyst 02/15/2013   OTHER&UNSPECIFIED DISEASES  THE ORAL SOFT TISSUES 07/25/2008   DERMATOFIBROMA, ARM 11/11/2007   NEOPLASM, SKIN, UNCERTAIN BEHAVIOR 02/11/2007   Hyperlipidemia 02/10/2007   Anxiety disorder 02/10/2007   Allergic rhinitis 02/10/2007   GERD 02/10/2007   IBS 02/10/2007   Past Medical History:  Diagnosis Date   Allergy    allergic rhinitis   Anxiety    situational   COVID 05/2021   Difficult intubation    states 2012 ablation surgery, had diff intubation,glide scope used, not noted in EPIC anesthesia record   Elevated hemoglobin A1c 08/08/2015   Elevated liver function tests 08/08/2015   GERD (gastroesophageal reflux disease)    Hyperlipidemia    Low serum vitamin D  08/08/2015   Status post endometrial ablation    Tennis elbow    currently in PT-10/16   Past Surgical History:  Procedure Laterality Date   BIOPSY  02/09/2022   Procedure: BIOPSY;  Surgeon: Wilhelmenia Aloha Raddle., MD;  Location: [facility];  Service: Gastroenterology;;   CESAREAN SECTION     COLONOSCOPY WITH PROPOFOL  N/A 02/09/2022   Procedure: COLONOSCOPY WITH PROPOFOL ;  Surgeon: Wilhelmenia Aloha Raddle., MD;  Location: [facility];  Service: Gastroenterology;  Laterality: N/A;   ENDOMETRIAL ABLATION     8/12   ESOPHAGOGASTRODUODENOSCOPY (EGD) WITH PROPOFOL  N/A 02/09/2022   Procedure: ESOPHAGOGASTRODUODENOSCOPY (EGD) WITH PROPOFOL ;  Surgeon: Wilhelmenia Aloha Raddle., MD;  Location: [facility];  Service: Gastroenterology;  Laterality: N/A;   EXTREMITY WIRE/PIN REMOVAL Left 07/16/2016   Procedure: PIN REMOVAL OF LEFT WRIST X 2;  Surgeon: Elsie Mussel, MD;  Location: [facility];  Service: Orthopedics;  Laterality: Left;   FOOT SURGERY Left    LAPAROSCOPIC TUBAL LIGATION  06/09/2011   Procedure: LAPAROSCOPIC TUBAL LIGATION;  Surgeon: Bobie DELENA Cary;  Location: [facility];  Service: Gynecology;  Laterality: N/A;   LIGAMENT REPAIR Left 05/08/2016   Procedure: LEFT WRIST SCAPHOLUNATE LIGAMENT RECONSTRUCTION WITH TENDON GRAFT PIN  NEURECTOMY AND REPAIR;  Surgeon: Elsie Mussel, MD;  Location: [facility];  Service: Orthopedics;  Laterality: Left;   LIVER TRANSPLANT  09/2022   POLYPECTOMY  02/09/2022   Procedure: POLYPECTOMY;  Surgeon: Mansouraty, Aloha Raddle., MD;  Location: [facility];  Service: Gastroenterology;;   TONSILLECTOMY     as child   Social History   Tobacco Use   Smoking status: Former    Current packs/day: 0.00    Types: Cigarettes    Quit date: 07/25/1983    Years since quitting: 40.9   Smokeless tobacco: Never   Tobacco comments:    in high school  Vaping Use   Vaping status: Never Used  Substance Use Topics   Alcohol  use: Not Currently    Alcohol /week: 14.0 standard drinks of alcohol     Types: 14 Glasses of wine per week    Comment: 6 shots daily 12 shots daily on weekends   Drug use: No   Family History  Problem Relation Age of Onset   Cancer Mother        [place] insitu of appendix   Heart disease Father        CAD   Diabetes Father        type II   Alzheimer's disease Father    Colon polyps Father    Diabetes Brother        type II   Diabetes Maternal Grandfather    Diabetes Paternal Grandmother    Diabetes Paternal Grandfather    Breast cancer Neg Hx    Colon cancer Neg Hx    Stomach cancer Neg Hx    Esophageal cancer Neg Hx    Pancreatic cancer Neg Hx    Allergies  Allergen Reactions   Lipitor [Atorvastatin ] Other (See Comments)    myalgias   Current Outpatient Medications on File Prior to Visit  Medication Sig Dispense Refill   aspirin 81 MG chewable tablet Chew by mouth daily.     Calcium  Citrate-Vitamin D  315-5 MG-MCG TABS Take by mouth.     clotrimazole  (MYCELEX ) 10 MG troche Take 1 tablet (10 mg total) by mouth 2 (two) times daily. Dissolve tablet(troche) slowly and completely in mouth. 60 tablet 2   Continuous Glucose Sensor (FREESTYLE LIBRE 2 PLUS SENSOR) MISC Use 1 each as directed 6 each 3   filgrastim -aafi (NIVESTYM ) 480 MCG/0.8ML SOSY  injection Inject 0.8 mLs (480 mcg total) subcutaneously once daily 0.8 mL 3   gabapentin  (NEURONTIN ) 100 MG capsule Take 1 capsule (100 mg total) by mouth 3 (three) times daily. In addition to the 300 mg pill 90 capsule 3   gabapentin  (NEURONTIN ) 300 MG capsule Take 1 capsule (300 mg total) by mouth 3 (three) times daily. 90 capsule 11   glucose blood (PRECISION QID TEST) test strip Use 1 strip to test 3 (three) times daily 100 each 1   [START ON 06/16/2024] HYDROcodone -acetaminophen  (NORCO/VICODIN) 5-325 MG tablet Take 1 tablet by mouth 3 (three) times daily  as needed for severe pain (pain score 7-10). Must last 30 days 90 tablet 0   insulin  lispro (HUMALOG ) 100 UNIT/ML KwikPen Inject 28 units with breakfast, 28 units with lunch, and 22 units with dinner in addition to sliding scale.  Max daily dose 110 units. 36 mL 5   Insulin  Pen Needle 31G X 5 MM MISC Use as directed 100 each 3   Insulin  Pen Needle 31G X 5 MM MISC Use as directed 100 each 5   Lancets (FREESTYLE) lancets use as directed three times daily. 100 each 1   LORazepam  (ATIVAN ) 1 MG tablet Take 1 tablet (1 mg total) by mouth 2 (two) times daily as needed for anxiety. Caution of sedation 30 tablet 0   methocarbamol  (ROBAXIN ) 750 MG tablet Take 1 tablet (750 mg total) by mouth every 12 (twelve) hours as needed for muscle spasms. 60 tablet 0   mycophenolate  (CELLCEPT ) 250 MG capsule Take 6 capsules (1,500 mg total) by mouth every 12 (twelve) hours. 360 capsule 11   omeprazole (PRILOSEC) 20 MG capsule Take 20 mg by mouth daily.     Polyvinyl Alcohol -Povidone (REFRESH OP) Place 1-2 drops into both eyes 4 (four) times daily as needed (dry eye).     predniSONE  (DELTASONE ) 5 MG tablet Take 1 tablet (5 mg total) by mouth daily. 45 tablet 2   sulfamethoxazole -trimethoprim  (BACTRIM  DS) 800-160 MG tablet Take 1 tablet by mouth every Monday, Wednesday, and Friday 12 tablet 2   tacrolimus  (PROGRAF ) 1 MG capsule Take 2 capsules (2 mg total) by mouth every  12 (twelve) hours. 120 capsule 11   Tbo-Filgrastim  (GRANIX ) 480 MCG/0.8ML SOSY injection Inject 0.73 mLs (438 mcg total) subcutaneously once daily 0.8 mL 3   valGANciclovir  (VALCYTE ) 450 MG tablet Take 1 tablet (450 mg total) by mouth daily with dinner 60 tablet 2   No current facility-administered medications on file prior to visit.    Review of Systems  Constitutional:  Negative for fatigue and fever.  Musculoskeletal:  Positive for back pain.  Skin:  Positive for rash.       Lesion/bump on arm   Neurological:  Positive for headaches.       A few headaches recently       Objective:   Physical Exam Skin:    Comments: 1 by 1.5 cm shiny pink to purple plaque on left forearm just distal to elbow  Non tender  Smooth  Some tiny papules surrounding  No central clearing    Rash  On arms/legs/sun exp areas Tiny papules  Skin color/no erythema  No whelps/ vesicles  No excoriations            Assessment & Plan:   Problem List Items Addressed This Visit       Musculoskeletal and Integument   Skin plaque - Primary   Small shiny smooth plaque just distal to left elbow on dorsal forearm , some pink to purple (hyperpigmented) color  Not bothersome   Liver transplant patient- going down gradually on her prednisone    Suspect atopic dermatitis vs early psoriasis based on appearance   Prescription triamcinolone  cream 0.1% to use small amt bid  Update if not starting to improve in a week or if worsening   Call back and Er precautions noted in detail today        Rash   On sun exposed areas Started at [employer] using sunscreen product she does not usually use Gradually improving with time Reassuring exam- resembles contact derm  Encouraged to Keep cool Avoid using that sunscreen (banana boat) in future Avoid sun exposure  Avoid hot water/harsh detergents  Use scent free products Update if no further improvement

## 2024-06-14 ENCOUNTER — Encounter: Payer: Self-pay | Admitting: Student in an Organized Health Care Education/Training Program

## 2024-06-14 DIAGNOSIS — B259 Cytomegaloviral disease, unspecified: Secondary | ICD-10-CM | POA: Diagnosis not present

## 2024-06-14 DIAGNOSIS — Z944 Liver transplant status: Secondary | ICD-10-CM | POA: Diagnosis not present

## 2024-06-14 DIAGNOSIS — D849 Immunodeficiency, unspecified: Secondary | ICD-10-CM | POA: Diagnosis not present

## 2024-06-19 ENCOUNTER — Other Ambulatory Visit: Payer: Self-pay

## 2024-06-20 DIAGNOSIS — R232 Flushing: Secondary | ICD-10-CM | POA: Diagnosis not present

## 2024-06-20 DIAGNOSIS — F1011 Alcohol abuse, in remission: Secondary | ICD-10-CM | POA: Diagnosis not present

## 2024-06-20 DIAGNOSIS — D849 Immunodeficiency, unspecified: Secondary | ICD-10-CM | POA: Diagnosis not present

## 2024-06-20 DIAGNOSIS — B259 Cytomegaloviral disease, unspecified: Secondary | ICD-10-CM | POA: Diagnosis not present

## 2024-06-20 DIAGNOSIS — Z944 Liver transplant status: Secondary | ICD-10-CM | POA: Diagnosis not present

## 2024-06-22 ENCOUNTER — Other Ambulatory Visit (HOSPITAL_COMMUNITY): Payer: Self-pay

## 2024-06-22 ENCOUNTER — Other Ambulatory Visit: Payer: Self-pay

## 2024-06-22 MED ORDER — PREDNISONE 5 MG PO TABS
ORAL_TABLET | ORAL | 0 refills | Status: DC
  Filled 2024-06-22: qty 15, 30d supply, fill #0

## 2024-06-26 ENCOUNTER — Other Ambulatory Visit: Payer: Self-pay

## 2024-06-26 ENCOUNTER — Other Ambulatory Visit (HOSPITAL_COMMUNITY): Payer: Self-pay

## 2024-06-27 ENCOUNTER — Other Ambulatory Visit: Payer: Self-pay

## 2024-06-27 ENCOUNTER — Other Ambulatory Visit (HOSPITAL_COMMUNITY): Payer: Self-pay

## 2024-06-27 MED ORDER — PREVYMIS 480 MG PO TABS
480.0000 mg | ORAL_TABLET | Freq: Every day | ORAL | 5 refills | Status: AC
  Filled 2024-07-19: qty 28, 28d supply, fill #0
  Filled 2024-07-24: qty 35, fill #0
  Filled 2024-07-25 (×2): qty 28, 28d supply, fill #0
  Filled 2024-08-16: qty 28, 28d supply, fill #1
  Filled 2024-09-11: qty 28, 28d supply, fill #2
  Filled 2024-10-09: qty 28, 28d supply, fill #3
  Filled 2024-11-03 – 2024-11-17 (×7): qty 28, 28d supply, fill #4

## 2024-07-04 ENCOUNTER — Other Ambulatory Visit (HOSPITAL_COMMUNITY): Payer: Self-pay

## 2024-07-04 ENCOUNTER — Encounter: Payer: Self-pay | Admitting: Nurse Practitioner

## 2024-07-04 ENCOUNTER — Telehealth: Payer: Self-pay | Admitting: Nurse Practitioner

## 2024-07-04 ENCOUNTER — Ambulatory Visit: Attending: Nurse Practitioner | Admitting: Nurse Practitioner

## 2024-07-04 ENCOUNTER — Other Ambulatory Visit: Payer: Self-pay

## 2024-07-04 VITALS — BP 126/89 | HR 99 | Temp 97.1°F | Ht 61.0 in | Wt 199.0 lb

## 2024-07-04 DIAGNOSIS — G8929 Other chronic pain: Secondary | ICD-10-CM | POA: Diagnosis not present

## 2024-07-04 DIAGNOSIS — G894 Chronic pain syndrome: Secondary | ICD-10-CM | POA: Diagnosis not present

## 2024-07-04 DIAGNOSIS — M5416 Radiculopathy, lumbar region: Secondary | ICD-10-CM | POA: Insufficient documentation

## 2024-07-04 DIAGNOSIS — B259 Cytomegaloviral disease, unspecified: Secondary | ICD-10-CM | POA: Diagnosis not present

## 2024-07-04 DIAGNOSIS — E099 Drug or chemical induced diabetes mellitus without complications: Secondary | ICD-10-CM | POA: Diagnosis not present

## 2024-07-04 DIAGNOSIS — T380X5A Adverse effect of glucocorticoids and synthetic analogues, initial encounter: Secondary | ICD-10-CM | POA: Insufficient documentation

## 2024-07-04 DIAGNOSIS — D849 Immunodeficiency, unspecified: Secondary | ICD-10-CM | POA: Diagnosis not present

## 2024-07-04 DIAGNOSIS — Z944 Liver transplant status: Secondary | ICD-10-CM | POA: Diagnosis not present

## 2024-07-04 DIAGNOSIS — Z8719 Personal history of other diseases of the digestive system: Secondary | ICD-10-CM | POA: Insufficient documentation

## 2024-07-04 DIAGNOSIS — Z79899 Other long term (current) drug therapy: Secondary | ICD-10-CM | POA: Diagnosis not present

## 2024-07-04 MED ORDER — OXYCODONE HCL 5 MG PO TABS
5.0000 mg | ORAL_TABLET | Freq: Three times a day (TID) | ORAL | 0 refills | Status: DC | PRN
  Filled 2024-07-19 – 2024-07-22 (×2): qty 90, 30d supply, fill #0

## 2024-07-04 MED ORDER — METHOCARBAMOL 750 MG PO TABS
750.0000 mg | ORAL_TABLET | Freq: Two times a day (BID) | ORAL | 0 refills | Status: DC | PRN
  Filled 2024-07-04 (×2): qty 30, 15d supply, fill #0

## 2024-07-04 NOTE — Progress Notes (Signed)
 PROVIDER NOTE: Interpretation of information contained herein should be left to medically-trained personnel. Specific patient instructions are provided elsewhere under Patient Instructions section of medical record. This document was created in part using AI and STT-dictation technology, any transcriptional errors that may result from this process are unintentional.  Patient: Jamie Coleman  Service: E/M   PCP: Randeen Laine LABOR, MD  DOB: [birth year]  DOS: 07/04/2024  Provider: Emmy MARLA Blanch, NP  MRN: [id number]  Delivery: Face-to-face  Specialty: Interventional Pain Management  Type: Established Patient  Setting: Ambulatory outpatient facility  Specialty designation: 09  Referring Prov.: Tower, Laine LABOR, MD  Location: Outpatient office facility       History of present illness (HPI) Jamie Coleman, a 53 y.o. year old female, is here today because of her Chronic radicular lumbar pain [M54.16, G89.29]. Jamie Coleman primary complain today is Leg Pain (right)  Pertinent problems: Jamie Coleman has ; Acute on chronic alcoholic liver disease (HCC); Decompensation of cirrhosis of liver (HCC); History of alcoholic hepatitis; Estrogen deficiency; Multiple joint pain; Menopause; H/O liver transplant (HCC); On prednisone  therapy; Osteopenia; Low back pain; Lumbar spinal stenosis; and Lumbar radiculopathy on their pertinent problem list.   Pain Assessment: Severity of Chronic pain is reported as a 4 /10. Location: Leg Right/radiates down right  outer thigh to knee. Onset: More than a month ago. Quality: [place], Shooting, Aching. Timing: Intermittent. Modifying factor(s): meds. Vitals:  height is 5' 1 (1.549 m) and weight is 199 lb (90.3 kg). Her temperature is 97.1 F (36.2 C) (abnormal). Her blood pressure is 126/89 and her pulse is 99. Her oxygen saturation is 100%.  BMI: Estimated body mass index is 37.6 kg/m as calculated from the following:   Height as of this encounter: 5' 1 (1.549 m).   Weight  as of this encounter: 199 lb (90.3 kg).  Last encounter: 05/17/2024 Last procedure: Visit date not found.  Reason for encounter: evaluation for possible interventional PM therapy/treatment and medication management. No change in medical history since last visit.  Patient's pain is at baseline.  Patient continues multimodal pain regimen as prescribed.  States that it provides pain relief and improvement in functional status.  The patient continues to experience low back pain radiating to the right leg.  The pain was severe enough to disrupt sleep and a daily activities prior to receiving back injection.  She previously had significant pain relief with bilateral lumbar transforaminal epidural injection at L3-L4 to other clinic.  Currently, she is experiencing a flareup of low back pain and expressed interest in receiving repeat lumbar transforaminal epidural injection (L-TFESI) at our clinic.  Pharmacotherapy Assessment   Started oxycodone  (oxy IR/oxycodone ) 5 mg immediate release every 8 hours as needed for pain. Discontinue Norco 5-325 every 8 hours Monitoring: [place] PMP: PDMP reviewed during this encounter.       Pharmacotherapy: No side-effects or adverse reactions reported. Compliance: No problems identified. Effectiveness: Clinically acceptable.  Margrette Nathanel JINNY, RN  07/04/2024  1:06 PM  Sign when Signing Visit Safety precautions to be maintained throughout the outpatient stay will include: orient to surroundings, keep bed in low position, maintain call bell within reach at all times, provide assistance with transfer out of bed and ambulation.   Nursing Pain Medication Assessment:  Safety precautions to be maintained throughout the outpatient stay will include: orient to surroundings, keep bed in low position, maintain call bell within reach at all times, provide assistance with transfer out of bed and ambulation.  Medication Inspection Compliance: Pill count conducted under aseptic  conditions, in front of the patient. Neither the pills nor the bottle was removed from the patient's sight at any time. Once count was completed pills were immediately returned to the patient in their original bottle.  Medication: Hydrocodone /APAP Pill/Patch Count: 51 of 90 pills/patches remain Pill/Patch Appearance: Markings consistent with prescribed medication Bottle Appearance: Standard pharmacy container. Clearly labeled. Filled Date: 26 / 8 / 2025 Last Medication intake:  Today    UDS:  Summary  Date Value Ref Range Status  05/11/2024 FINAL  Final    Comment:    ==================================================================== Compliance Drug Analysis, Ur ==================================================================== Test                             Result       Flag       Units  Drug Present and Declared for Prescription Verification   Gabapentin                      PRESENT      EXPECTED   Methocarbamol                   PRESENT      EXPECTED  Drug Present not Declared for Prescription Verification   Acetaminophen                   PRESENT      UNEXPECTED   Diphenhydramine                 PRESENT      UNEXPECTED  Drug Absent but Declared for Prescription Verification   Lorazepam                       Not Detected UNEXPECTED ng/mg creat   Tramadol                        Not Detected UNEXPECTED ng/mg creat   Salicylate                     Not Detected UNEXPECTED    Aspirin, as indicated in the declared medication list, is not always    detected even when used as directed.  ==================================================================== Test                      Result    Flag   Units      Ref Range   Creatinine              160              mg/dL      >=79 ==================================================================== Declared Medications:  The flagging and interpretation on this report are based on the  following declared medications.  Unexpected results  may arise from  inaccuracies in the declared medications.   **Note: The testing scope of this panel includes these medications:   Gabapentin   Lorazepam   Methocarbamol   Tramadol    **Note: The testing scope of this panel does not include small to  moderate amounts of these reported medications:   Aspirin   **Note: The testing scope of this panel does not include the  following reported medications:   Calcium   Clotrimazole   Eye Drops  Filgrastim  (Nivestym )  Insulin   Mycophenolate  mofetil (Cellcept )  Omeprazole  Prednisone   Sulfamethoxazole  (Bactrim )  Tacrolimus   Tbo-filgrastim   Trimethoprim  (Bactrim )  Valganciclovir   Vitamin D  ==================================================================== For clinical consultation, please call [phone]. ====================================================================     No results found for: CBDTHCR No results found for: D8THCCBX No results found for: D9THCCBX  ROS  Constitutional: Denies any fever or chills Gastrointestinal: No reported hemesis, hematochezia, vomiting, or acute GI distress Musculoskeletal: Denies any acute onset joint swelling, redness, loss of ROM, or weakness Neurological: No reported episodes of acute onset apraxia, aphasia, dysarthria, agnosia, amnesia, paralysis, loss of coordination, or loss of consciousness  Medication Review  Calcium  Citrate-Vitamin D , FreeStyle Libre 2 Plus Sensor, Insulin  Pen Needle, LORazepam , Letermovir , Polyvinyl Alcohol -Povidone, Tbo-Filgrastim , aspirin, clotrimazole , filgrastim -aafi, freestyle, gabapentin , glucose blood, insulin  lispro, methocarbamol , mycophenolate , omeprazole, oxyCODONE , predniSONE , sulfamethoxazole -trimethoprim , tacrolimus , triamcinolone  cream, and valGANciclovir   History Review  Allergy: Jamie Coleman is allergic to lipitor [atorvastatin ]. Drug: Jamie Coleman  reports no history of drug use. Alcohol :  reports that she does not currently use  alcohol  after a past usage of about 14.0 standard drinks of alcohol  per week. Tobacco:  reports that she quit smoking about 40 years ago. Her smoking use included cigarettes. She has never used smokeless tobacco. Social: Jamie Coleman  reports that she quit smoking about 40 years ago. Her smoking use included cigarettes. She has never used smokeless tobacco. She reports that she does not currently use alcohol  after a past usage of about 14.0 standard drinks of alcohol  per week. She reports that she does not use drugs. Medical:  has a past medical history of Allergy, Anxiety, COVID (05/2021), Difficult intubation, Elevated hemoglobin A1c (08/08/2015), Elevated liver function tests (08/08/2015), GERD (gastroesophageal reflux disease), Hyperlipidemia, Low serum vitamin D  (08/08/2015), Status post endometrial ablation, and Tennis elbow. Surgical: Jamie Coleman  has a past surgical history that includes Cesarean section; Foot surgery (Left); Tonsillectomy; Endometrial ablation; Laparoscopic tubal ligation (06/09/2011); Ligament repair (Left, 05/08/2016); Extremity wire/pin removal (Left, 07/16/2016); Colonoscopy with propofol  (N/A, 02/09/2022); Esophagogastroduodenoscopy (egd) with propofol  (N/A, 02/09/2022); biopsy (02/09/2022); polypectomy (02/09/2022); and Liver transplant (09/2022). Family: family history includes Alzheimer's disease in her father; Cancer in her mother; Colon polyps in her father; Diabetes in her brother, father, maternal grandfather, paternal grandfather, and paternal grandmother; Heart disease in her father.  Laboratory Chemistry Profile   Renal Lab Results  Component Value Date   BUN 43 (H) 09/25/2022   CREATININE 1.2 (A) 03/09/2024   GFR 34.93 (L) 09/25/2022   GFRAA >60 07/15/2020   GFRNONAA >60 09/22/2022    Hepatic Lab Results  Component Value Date   AST 33 03/09/2024   ALT 61 (A) 03/09/2024   ALBUMIN  3.0 (L) 09/25/2022   ALKPHOS 300 (H) 09/25/2022   HCVAB NON REACTIVE  09/18/2021   LIPASE 59 (H) 09/21/2022   AMMONIA 66 (H) 09/25/2022    Electrolytes Lab Results  Component Value Date   NA 122 (L) 09/25/2022   K 5.3 (H) 09/25/2022   CL 89 (L) 09/25/2022   CALCIUM  9.4 09/25/2022   MG 2.4 09/07/2022   PHOS 3.1 09/17/2021    Bone Lab Results  Component Value Date   VD25OH 62.42 08/30/2019    Inflammation (CRP: Acute Phase) (ESR: Chronic Phase) Lab Results  Component Value Date   CRP 20.1 (H) 09/03/2022   ESRSEDRATE 5 02/15/2023   LATICACIDVEN 1.2 09/21/2022         Note: Above Lab results reviewed.  Recent Imaging Review  DG PAIN CLINIC C-ARM 1-60 MIN NO REPORT Fluoro was used, but no Radiologist interpretation will be provided.  Please refer to NOTES tab for provider progress note. Note:  Reviewed        DG Lumbar Spine 2-3 Views   Narrative CLINICAL DATA:  Low back pain radiating to the SI joints bilaterally. Worse on the left.   EXAM: LUMBAR SPINE - 2 VIEW   COMPARISON:  None Available.   FINDINGS: Five lumbar-type vertebral bodies. There is levoconvex curvature of the spine centered at L3. Preserved vertebral body heights. There is disc height loss at L2-3, L3-4. Minimal endplate osteophytes. Prominent lower lumbar facet degenerative changes. No listhesis. Surgical clips overlie the right upper quadrant. If there is further concern of the acute process additional cross-sectional study could be considered as clinically appropriate.   IMPRESSION: Curvature of the spine with moderate degenerative changes.     Electronically Signed By: Ranell Bring M.D. On: 09/22/2023 17:17     Physical Exam  Vitals: BP 126/89   Pulse 99   Temp (!) 97.1 F (36.2 C)   Ht 5' 1 (1.549 m)   Wt 199 lb (90.3 kg)   LMP 05/31/2019 (Within Days) Comment: spotting  SpO2 100%   BMI 37.60 kg/m  BMI: Estimated body mass index is 37.6 kg/m as calculated from the following:   Height as of this encounter: 5' 1 (1.549 m).   Weight as of  this encounter: 199 lb (90.3 kg). Ideal: Ideal body weight: 47.8 kg (105 lb 6.1 oz) Adjusted ideal body weight: 64.8 kg (142 lb 13.2 oz) General appearance: Well nourished, well developed, and well hydrated. In no apparent acute distress Mental status: Alert, oriented x 3 (person, place, & time)       Respiratory: No evidence of acute respiratory distress Eyes: PERLA  Thoracic Spine Area Exam  Skin & Axial Inspection: No masses, redness, or swelling Alignment: Symmetrical Functional ROM: Unrestricted ROM Stability: No instability detected Muscle Tone/Strength: Functionally intact. No obvious neuro-muscular anomalies detected. Sensory (Neurological): Unimpaired Muscle strength & Tone: No palpable anomalies Lumbar Spine Area Exam  Skin & Axial Inspection: No masses, redness, or swelling Alignment: Symmetrical Functional ROM: Limited ROM       Stability: No instability detected Muscle Tone/Strength: Functionally intact. No obvious neuro-muscular anomalies detected. Sensory (Neurological): Dermatomal pain pattern Palpation: No palpable anomalies       Provocative Tests: Hyperextension/rotation test: (+) due to pain. Lumbar quadrant test (Kemp's test): (+) bilateral for foraminal stenosis   Gait & Posture Assessment  Ambulation: Unassisted Gait: Relatively normal for age and body habitus Posture: WNL  Lower Extremity Exam      Side: Right lower extremity   Side: Left lower extremity  Stability: No instability observed           Stability: No instability observed          Skin & Extremity Inspection: Skin color, temperature, and hair growth are WNL. No peripheral edema or cyanosis. No masses, redness, swelling, asymmetry, or associated skin lesions. No contractures.   Skin & Extremity Inspection: Skin color, temperature, and hair growth are WNL. No peripheral edema or cyanosis. No masses, redness, swelling, asymmetry, or associated skin lesions. No contractures.  Functional ROM: Pain  restricted ROM for all joints of the lower extremity           Functional ROM: Pain restricted ROM for all joints of the lower extremity          Muscle Tone/Strength: Functionally intact. No obvious neuro-muscular anomalies detected.   Muscle Tone/Strength: Functionally intact. No obvious neuro-muscular anomalies detected.  Sensory (Neurological): Unimpaired  Sensory (Neurological): Unimpaired        DTR: Patellar: deferred today Achilles: deferred today Plantar: deferred today   DTR: Patellar: deferred today Achilles: deferred today Plantar: deferred today  Palpation: No palpable anomalies   Palpation: No palpable anomalies     Assessment   Diagnosis Status  1. Chronic radicular lumbar pain   2. Lumbar radiculopathy   3. Chronic pain syndrome   4. H/O liver transplant (HCC)   5. History of alcoholic hepatitis   6. Steroid-induced diabetes mellitus, initial encounter   7. Medication management    Having a Flare-up Having a Flare-up Controlled   Updated Problems: No problems updated.  Plan of Care  Problem-specific:  Assessment and Plan Discontinue Norco 5-325 mg every 8 hours as needed  Started oxycodone  5 mg immediate release every 8 hours as needed for pain . Prescription drug monitoring (PDMP) reviewed; finding consistent with the use of prescribed medication.  Urine drug screening (UDS) up to date.  No other new issues or problems reported at this visit.  Schedule follow-up in 30 days for medication management with Safiatou Islam, NP.  Plan: (Clinic): (B) L-TFESI with Dr. Marcelino    Jamie Coleman has a current medication list which includes the following long-term medication(s): calcium  citrate-vitamin d , gabapentin , gabapentin , insulin  lispro, and omeprazole.  Pharmacotherapy (Medications Ordered): Meds ordered this encounter  Medications   oxyCODONE  (OXY IR/ROXICODONE ) 5 MG immediate release tablet    Sig: Take 1 tablet (5 mg total) by mouth every  8 (eight) hours as needed for severe pain (pain score 7-10). Must last 30 days.    Dispense:  90 tablet    Refill:  0    Chronic Pain: STOP Act (Not applicable) Fill 1 day early if closed on refill date. Avoid benzodiazepines within 8 hours of opioids   Orders:  Orders Placed This Encounter  Procedures   Lumbar Transforaminal Epidural    Standing Status:   Future    Expiration Date:   10/03/2024    Scheduling Instructions:     Laterality: Bilateral     Level(s): L3 & L4     Sedation: Patient's choice.     Timeframe: As soon as schedule allows.    Where will this procedure be performed?:   [facility]        Return in about 2 weeks (around 07/18/2024) for (Clinic): (B) L-TFESI with Dr. Marcelino .    Recent Visits Date Type Provider Dept  05/17/24 Office Visit Marcelino Nurse, MD [facility]  05/11/24 Office Visit Marcelino Nurse, MD [facility]  Showing recent visits within past 90 days and meeting all other requirements Today's Visits Date Type Provider Dept  07/04/24 Office Visit Madysun Thall K, NP [facility]  Showing today's visits and meeting all other requirements Future Appointments Date Type Provider Dept  07/19/24 Appointment Marcelino Nurse, MD [facility]  07/31/24 Appointment Deivi Huckins K, NP [facility]  Showing future appointments within next 90 days and meeting all other requirements  I discussed the assessment and treatment plan with the patient. The patient was provided an opportunity to ask questions and all were answered. The patient agreed with the plan and demonstrated an understanding of the instructions.  Patient advised to call back or seek an in-person evaluation if the symptoms or condition worsens. I personally spent a total of 30 minutes in the care of the patient today including preparing to see the patient, getting/reviewing separately  obtained history, performing a medically appropriate  exam/evaluation, counseling and educating, placing orders, documenting clinical information in the EHR, independently interpreting results, communicating results, and coordinating care.   Duration of encounter:  minutes.  Total time on encounter, as per AMA guidelines included both the face-to-face and non-face-to-face time personally spent by the physician and/or other qualified health care professional(s) on the day of the encounter (includes time in activities that require the physician or other qualified health care professional and does not include time in activities normally performed by clinical staff). Physician's time may include the following activities when performed: Preparing to see the patient (e.g., pre-charting review of records, searching for previously ordered imaging, lab work, and nerve conduction tests) Review of prior analgesic pharmacotherapies. Reviewing PMP Interpreting ordered tests (e.g., lab work, imaging, nerve conduction tests) Performing post-procedure evaluations, including interpretation of diagnostic procedures Obtaining and/or reviewing separately obtained history Performing a medically appropriate examination and/or evaluation Counseling and educating the patient/family/caregiver Ordering medications, tests, or procedures Referring and communicating with other health care professionals (when not separately reported) Documenting clinical information in the electronic or other health record Independently interpreting results (not separately reported) and communicating results to the patient/ family/caregiver Care coordination (not separately reported)  Note by: Emmy MARLA Blanch, NP Date: 07/04/2024; Time: 2:48 PM

## 2024-07-04 NOTE — Patient Instructions (Signed)

## 2024-07-04 NOTE — Telephone Encounter (Signed)
 Patient stated during check out that she forgot to see if Seema would refill her Methocarbamol . Please give patient a call. TY

## 2024-07-04 NOTE — Progress Notes (Signed)
 Safety precautions to be maintained throughout the outpatient stay will include: orient to surroundings, keep bed in low position, maintain call bell within reach at all times, provide assistance with transfer out of bed and ambulation.   Nursing Pain Medication Assessment:  Safety precautions to be maintained throughout the outpatient stay will include: orient to surroundings, keep bed in low position, maintain call bell within reach at all times, provide assistance with transfer out of bed and ambulation.  Medication Inspection Compliance: Pill count conducted under aseptic conditions, in front of the patient. Neither the pills nor the bottle was removed from the patient's sight at any time. Once count was completed pills were immediately returned to the patient in their original bottle.  Medication: Hydrocodone /APAP Pill/Patch Count: 51 of 90 pills/patches remain Pill/Patch Appearance: Markings consistent with prescribed medication Bottle Appearance: Standard pharmacy container. Clearly labeled. Filled Date: 80 / 8 / 2025 Last Medication intake:  Today

## 2024-07-05 ENCOUNTER — Other Ambulatory Visit (HOSPITAL_COMMUNITY): Payer: Self-pay

## 2024-07-05 MED ORDER — TACROLIMUS 1 MG PO CAPS
ORAL_CAPSULE | ORAL | 1 refills | Status: DC
  Filled 2024-07-05 – 2024-08-10 (×2): qty 90, 30d supply, fill #0
  Filled 2024-09-19: qty 90, 30d supply, fill #1

## 2024-07-06 ENCOUNTER — Other Ambulatory Visit (HOSPITAL_COMMUNITY): Payer: Self-pay

## 2024-07-06 MED ORDER — MYCOPHENOLATE MOFETIL 250 MG PO CAPS
ORAL_CAPSULE | ORAL | 11 refills | Status: DC
  Filled 2024-07-24: qty 310, 28d supply, fill #0

## 2024-07-07 ENCOUNTER — Other Ambulatory Visit (HOSPITAL_COMMUNITY): Payer: Self-pay

## 2024-07-07 MED ORDER — PREDNISONE 1 MG PO TABS
5.0000 mg | ORAL_TABLET | ORAL | 0 refills | Status: DC
  Filled 2024-07-07: qty 210, 70d supply, fill #0

## 2024-07-10 ENCOUNTER — Other Ambulatory Visit: Payer: Self-pay | Admitting: Student in an Organized Health Care Education/Training Program

## 2024-07-10 ENCOUNTER — Other Ambulatory Visit (HOSPITAL_COMMUNITY): Payer: Self-pay

## 2024-07-10 ENCOUNTER — Inpatient Hospital Stay
Admission: RE | Admit: 2024-07-10 | Discharge: 2024-07-10 | Disposition: A | Payer: Self-pay | Source: Ambulatory Visit | Attending: Student in an Organized Health Care Education/Training Program | Admitting: Student in an Organized Health Care Education/Training Program

## 2024-07-10 DIAGNOSIS — M545 Low back pain, unspecified: Secondary | ICD-10-CM

## 2024-07-11 ENCOUNTER — Encounter: Admitting: Student in an Organized Health Care Education/Training Program

## 2024-07-11 DIAGNOSIS — D849 Immunodeficiency, unspecified: Secondary | ICD-10-CM | POA: Diagnosis not present

## 2024-07-11 DIAGNOSIS — Z944 Liver transplant status: Secondary | ICD-10-CM | POA: Diagnosis not present

## 2024-07-11 DIAGNOSIS — B259 Cytomegaloviral disease, unspecified: Secondary | ICD-10-CM | POA: Diagnosis not present

## 2024-07-19 ENCOUNTER — Ambulatory Visit
Admission: RE | Admit: 2024-07-19 | Discharge: 2024-07-19 | Disposition: A | Discharge status: Home / Self Care | Source: Ambulatory Visit | Attending: Student in an Organized Health Care Education/Training Program | Admitting: Student in an Organized Health Care Education/Training Program

## 2024-07-19 ENCOUNTER — Ambulatory Visit (HOSPITAL_BASED_OUTPATIENT_CLINIC_OR_DEPARTMENT_OTHER): Admitting: Student in an Organized Health Care Education/Training Program

## 2024-07-19 VITALS — BP 117/94 | HR 86 | Temp 97.3°F | Resp 15 | Ht 61.0 in | Wt 197.0 lb

## 2024-07-19 DIAGNOSIS — G894 Chronic pain syndrome: Secondary | ICD-10-CM

## 2024-07-19 DIAGNOSIS — M5416 Radiculopathy, lumbar region: Secondary | ICD-10-CM | POA: Insufficient documentation

## 2024-07-19 DIAGNOSIS — G8929 Other chronic pain: Secondary | ICD-10-CM

## 2024-07-19 MED ORDER — IOHEXOL 180 MG/ML  SOLN
INTRAMUSCULAR | Status: AC
  Filled 2024-07-19: qty 20

## 2024-07-19 MED ORDER — SODIUM CHLORIDE (PF) 0.9 % IJ SOLN
INTRAMUSCULAR | Status: AC
  Filled 2024-07-19: qty 10

## 2024-07-19 MED ORDER — ROPIVACAINE HCL 2 MG/ML IJ SOLN
2.0000 mL | Freq: Once | INTRAMUSCULAR | Status: AC
  Administered 2024-07-19: 2 mL via EPIDURAL

## 2024-07-19 MED ORDER — DEXAMETHASONE SODIUM PHOSPHATE 10 MG/ML IJ SOLN
20.0000 mg | Freq: Once | INTRAMUSCULAR | Status: AC
  Administered 2024-07-19: 20 mg

## 2024-07-19 MED ORDER — SODIUM CHLORIDE 0.9% FLUSH
2.0000 mL | Freq: Once | INTRAVENOUS | Status: AC
  Administered 2024-07-19: 2 mL

## 2024-07-19 MED ORDER — ROPIVACAINE HCL 2 MG/ML IJ SOLN
INTRAMUSCULAR | Status: AC
  Filled 2024-07-19: qty 20

## 2024-07-19 MED ORDER — DEXAMETHASONE SODIUM PHOSPHATE 10 MG/ML IJ SOLN
INTRAMUSCULAR | Status: AC
  Filled 2024-07-19: qty 2

## 2024-07-19 MED ORDER — DIAZEPAM 5 MG PO TABS
ORAL_TABLET | ORAL | Status: AC
  Filled 2024-07-19: qty 1

## 2024-07-19 MED ORDER — LIDOCAINE HCL (PF) 2 % IJ SOLN
INTRAMUSCULAR | Status: AC
  Filled 2024-07-19: qty 10

## 2024-07-19 MED ORDER — DIAZEPAM 5 MG PO TABS
5.0000 mg | ORAL_TABLET | ORAL | Status: AC
  Administered 2024-07-19: 5 mg via ORAL

## 2024-07-19 MED ORDER — IOHEXOL 180 MG/ML  SOLN
10.0000 mL | Freq: Once | INTRAMUSCULAR | Status: AC
  Administered 2024-07-19: 10 mL via EPIDURAL

## 2024-07-19 MED ORDER — LIDOCAINE HCL 2 % IJ SOLN
20.0000 mL | Freq: Once | INTRAMUSCULAR | Status: AC
  Administered 2024-07-19: 100 mg

## 2024-07-19 NOTE — Addendum Note (Signed)
 Addended by: IGNACIO DELON BROCKS on: 07/19/2024 01:23 PM   Modules accepted: Orders

## 2024-07-19 NOTE — Addendum Note (Signed)
 Addended by: MARCELINO NURSE on: 07/19/2024 12:23 PM   Modules accepted: Orders

## 2024-07-19 NOTE — Patient Instructions (Signed)

## 2024-07-19 NOTE — Progress Notes (Signed)
 PROVIDER NOTE: Interpretation of information contained herein should be left to medically-trained personnel. Specific patient instructions are provided elsewhere under Patient Instructions section of medical record. This document was created in part using STT-dictation technology, any transcriptional errors that may result from this process are unintentional.  Patient: Jamie Coleman Type: Established DOB: [birth year] MRN: [id number] PCP: Randeen Laine LABOR, MD  Service: Procedure DOS: 07/19/2024 Setting: Ambulatory Location: Ambulatory outpatient facility Delivery: Face-to-face Provider: Wallie Sherry, MD Specialty: Interventional Pain Management Specialty designation: 09 Location: Outpatient facility Ref. Prov.: Tower, Laine LABOR, MD       Interventional Therapy   Procedure: Lumbar trans-foraminal epidural steroid injection (L-TFESI) #1  Laterality: Bilateral (-50)  Level: L3 nerve root(s) Imaging: Fluoroscopy-guided         Anesthesia: Local anesthesia (1-2% Lidocaine ) DOS: 07/19/2024  Performed by: Wallie Sherry, MD  Purpose: Diagnostic/Therapeutic Indications: Lumbar radicular pain severe enough to impact quality of life or function. 1. Chronic radicular lumbar pain   2. Chronic pain syndrome   3. Lumbar radiculopathy    NAS-11 Pain score:   Pre-procedure: 4 /10   Post-procedure: 4 /10     Position / Prep / Materials:  Position: Prone  Prep solution: ChloraPrep (2% chlorhexidine  gluconate and 70% isopropyl alcohol ) Prep Area: Entire Posterior Lumbosacral Area.  From the lower tip of the scapula down to the tailbone and from flank to flank. Materials:  Tray: Block Needle(s):  Type: Spinal  Gauge (G): 22  Length: 5-in  Qty: 2     H&P (Pre-op Assessment):  Jamie Coleman is a 53 y.o. (year old), female patient, seen today for interventional treatment. She  has a past surgical history that includes Cesarean section; Foot surgery (Left); Tonsillectomy; Endometrial ablation;  Laparoscopic tubal ligation (06/09/2011); Ligament repair (Left, 05/08/2016); Extremity wire/pin removal (Left, 07/16/2016); Colonoscopy with propofol  (N/A, 02/09/2022); Esophagogastroduodenoscopy (egd) with propofol  (N/A, 02/09/2022); biopsy (02/09/2022); polypectomy (02/09/2022); and Liver transplant (09/2022). Jamie Coleman has a current medication list which includes the following prescription(s): aspirin, calcium  citrate-vitamin d , clotrimazole , freestyle libre 2 plus sensor, nivestym , gabapentin , gabapentin , precision qid test, insulin  lispro, insulin  pen needle, insulin  pen needle, freestyle, prevymis , lorazepam , methocarbamol , mycophenolate , mycophenolate , omeprazole, oxycodone , polyvinyl alcohol -povidone, prednisone , prednisone , sulfamethoxazole -trimethoprim , tacrolimus , granix , valganciclovir , and triamcinolone  cream. Her primarily concern today is the Back Pain (lower)  Initial Vital Signs:  Pulse/HCG Rate: 86ECG Heart Rate: 93 Temp: (!) 97.3 F (36.3 C) Resp: 18 BP: (!) 117/94 SpO2: 100 %  BMI: Estimated body mass index is 37.22 kg/m as calculated from the following:   Height as of this encounter: 5' 1 (1.549 m).   Weight as of this encounter: 197 lb (89.4 kg).  Risk Assessment: Allergies: Reviewed. She is allergic to lipitor [atorvastatin ].  Allergy Precautions: None required Coagulopathies: Reviewed. None identified.  Blood-thinner therapy: None at this time Active Infection(s): Reviewed. None identified. Jamie Coleman is afebrile  Site Confirmation: Jamie Coleman was asked to confirm the procedure and laterality before marking the site Procedure checklist: Completed Consent: Before the procedure and under the influence of no sedative(s), amnesic(s), or anxiolytics, the patient was informed of the treatment options, risks and possible complications. To fulfill our ethical and legal obligations, as recommended by the American Medical Association's Code of Ethics, I have informed  the patient of my clinical impression; the nature and purpose of the treatment or procedure; the risks, benefits, and possible complications of the intervention; the alternatives, including doing nothing; the risk(s) and benefit(s) of the alternative treatment(s) or procedure(s); and the risk(s)  and benefit(s) of doing nothing. The patient was provided information about the general risks and possible complications associated with the procedure. These may include, but are not limited to: failure to achieve desired goals, infection, bleeding, organ or nerve damage, allergic reactions, paralysis, and death. In addition, the patient was informed of those risks and complications associated to Spine-related procedures, such as failure to decrease pain; infection (i.e.: Meningitis, epidural or intraspinal abscess); bleeding (i.e.: epidural hematoma, subarachnoid hemorrhage, or any other type of intraspinal or peri-dural bleeding); organ or nerve damage (i.e.: Any type of peripheral nerve, nerve root, or spinal cord injury) with subsequent damage to sensory, motor, and/or autonomic systems, resulting in permanent pain, numbness, and/or weakness of one or several areas of the body; allergic reactions; (i.e.: anaphylactic reaction); and/or death. Furthermore, the patient was informed of those risks and complications associated with the medications. These include, but are not limited to: allergic reactions (i.e.: anaphylactic or anaphylactoid reaction(s)); adrenal axis suppression; blood sugar elevation that in diabetics may result in ketoacidosis or comma; water retention that in patients with history of congestive heart failure may result in shortness of breath, pulmonary edema, and decompensation with resultant heart failure; weight gain; swelling or edema; medication-induced neural toxicity; particulate matter embolism and blood vessel occlusion with resultant organ, and/or nervous system infarction; and/or aseptic  necrosis of one or more joints. Finally, the patient was informed that Medicine is not an exact science; therefore, there is also the possibility of unforeseen or unpredictable risks and/or possible complications that may result in a catastrophic outcome. The patient indicated having understood very clearly. We have given the patient no guarantees and we have made no promises. Enough time was given to the patient to ask questions, all of which were answered to the patient's satisfaction. Ms. Levay has indicated that she wanted to continue with the procedure. Attestation: I, the ordering provider, attest that I have discussed with the patient the benefits, risks, side-effects, alternatives, likelihood of achieving goals, and potential problems during recovery for the procedure that I have provided informed consent. Date  Time: 07/19/2024 10:55 AM  Pre-Procedure Preparation:  Monitoring: As per clinic protocol. Respiration, ETCO2, SpO2, BP, heart rate and rhythm monitor placed and checked for adequate function Safety Precautions: Patient was assessed for positional comfort and pressure points before starting the procedure. Time-out: I initiated and conducted the Time-out before starting the procedure, as per protocol. The patient was asked to participate by confirming the accuracy of the Time Out information. Verification of the correct person, site, and procedure were performed and confirmed by me, the nursing staff, and the patient. Time-out conducted as per Joint Commission's Universal Protocol (UP.01.01.01). Time: 1148 Start Time: 1148 hrs.  Description/Narrative of Procedure:          Target: The 6 o'clock position under the pedicle, on the affected side. Region: Posterolateral Lumbosacral Approach: Posterior Percutaneous Paravertebral approach.  Rationale (medical necessity): procedure needed and proper for the diagnosis and/or treatment of the patient's medical symptoms and  needs. Procedural Technique Safety Precautions: Aspiration looking for blood return was conducted prior to all injections. At no point did we inject any substances, as a needle was being advanced. No attempts were made at seeking any paresthesias. Safe injection practices and needle disposal techniques used. Medications properly checked for expiration dates. SDV (single dose vial) medications used. Description of the Procedure: Protocol guidelines were followed. The patient was placed in position over the procedure table. The target area was identified and the area  prepped in the usual manner. Skin & deeper tissues infiltrated with local anesthetic. Appropriate amount of time allowed to pass for local anesthetics to take effect. The procedure needles were then advanced to the target area. Proper needle placement secured. Negative aspiration confirmed. Solution injected in intermittent fashion, asking for systemic symptoms every 0.5cc of injectate. The needles were then removed and the area cleansed, making sure to leave some of the prepping solution back to take advantage of its long term bactericidal properties.  Vitals:   07/19/24 1057 07/19/24 1145 07/19/24 1151 07/19/24 1154  BP:  (!) 117/94 116/79 (!) 117/94  Pulse: 86     Resp: 18 18 15 15   Temp: (!) 97.3 F (36.3 C)     SpO2: 100% 100% 100% 100%  Weight: 197 lb (89.4 kg)     Height: 5' 1 (1.549 m)       Start Time: 1148 hrs. End Time: 1153 hrs.  Imaging Guidance (Spinal):          Type of Imaging Technique: Fluoroscopy Guidance (Spinal) Indication(s): Fluoroscopy guidance for needle placement to enhance accuracy in procedures requiring precise needle localization for targeted delivery of medication in or near specific anatomical locations not easily accessible without such real-time imaging assistance. Exposure Time: Please see nurses notes. Contrast: Before injecting any contrast, we confirmed that the patient did not have an allergy  to iodine, shellfish, or radiological contrast. Once satisfactory needle placement was completed at the desired level, radiological contrast was injected. Contrast injected under live fluoroscopy. No contrast complications. See chart for type and volume of contrast used. Fluoroscopic Guidance: I was personally present during the use of fluoroscopy. Tunnel Vision Technique used to obtain the best possible view of the target area. Parallax error corrected before commencing the procedure. Direction-depth-direction technique used to introduce the needle under continuous pulsed fluoroscopy. Once target was reached, antero-posterior, oblique, and lateral fluoroscopic projection used confirm needle placement in all planes. Images permanently stored in EMR. Interpretation: I personally interpreted the imaging intraoperatively. Adequate needle placement confirmed in multiple planes. Appropriate spread of contrast into desired area was observed. No evidence of afferent or efferent intravascular uptake. No intrathecal or subarachnoid spread observed. Permanent images saved into the patient's record.  Post-operative Assessment:  Post-procedure Vital Signs:  Pulse/HCG Rate: 8687 Temp: (!) 97.3 F (36.3 C) Resp: 15 BP: (!) 117/94 SpO2: 100 %  EBL: None  Complications: No immediate post-treatment complications observed by team, or reported by patient.  Note: The patient tolerated the entire procedure well. A repeat set of vitals were taken after the procedure and the patient was kept under observation following institutional policy, for this type of procedure. Post-procedural neurological assessment was performed, showing return to baseline, prior to discharge. The patient was provided with post-procedure discharge instructions, including a section on how to identify potential problems. Should any problems arise concerning this procedure, the patient was given instructions to immediately contact us , at any  time, without hesitation. In any case, we plan to contact the patient by telephone for a follow-up status report regarding this interventional procedure.  Comments:  No additional relevant information.  Plan of Care (POC)  Orders:  Orders Placed This Encounter  Procedures   DG PAIN CLINIC C-ARM 1-60 MIN NO REPORT    Intraoperative interpretation by procedural physician at [facility].    Standing Status:   Standing    Number of Occurrences:   1    Reason for exam::   Assistance in needle  guidance and placement for procedures requiring needle placement in or near specific anatomical locations not easily accessible without such assistance.    Medications ordered for procedure: Meds ordered this encounter  Medications   iohexol  (OMNIPAQUE ) 180 MG/ML injection 10 mL    Must be Myelogram-compatible. If not available, you may substitute with a water-soluble, non-ionic, hypoallergenic, myelogram-compatible radiological contrast medium.   lidocaine  (XYLOCAINE ) 2 % (with pres) injection 400 mg   sodium chloride  flush (NS) 0.9 % injection 2 mL    This is for a two (2) level block. Use two (2) syringes and divide content in half.   ropivacaine  (PF) 2 mg/mL (0.2%) (NAROPIN ) injection 2 mL    This is for a two (2) level block. Use two (2) syringes and divide content in half.   dexamethasone  (DECADRON ) injection 20 mg    This is for a two (2) level block. Use two (2) syringes and divide content in half.   Medications administered: We administered iohexol , lidocaine , sodium chloride  flush, ropivacaine  (PF) 2 mg/mL (0.2%), and dexamethasone .  See the medical record for exact dosing, route, and time of administration.    B/L L3 TF ESI 07/19/24    Follow-up plan:   Return for keep scheduled appointment.     Recent Visits Date Type Provider Dept  07/04/24 Office Visit Patel, Seema K, NP [facility]  05/17/24 Office Visit Marcelino Nurse, MD [facility]  05/11/24  Office Visit Marcelino Nurse, MD [facility]  Showing recent visits within past 90 days and meeting all other requirements Today's Visits Date Type Provider Dept  07/19/24 Procedure visit Marcelino Nurse, MD [facility]  Showing today's visits and meeting all other requirements Future Appointments Date Type Provider Dept  07/31/24 Appointment Patel, Seema K, NP [facility]  Showing future appointments within next 90 days and meeting all other requirements   Disposition: Discharge home  Discharge (Date  Time): 07/19/2024; 1200 hrs.   Primary Care Physician: Tower, Laine LABOR, MD Location: [facility] Note by: Nurse Marcelino, MD (TTS technology used. I apologize for any typographical errors that were not detected and corrected.) Date: 07/19/2024; Time: 11:59 AM  Disclaimer:  Medicine is not an Visual merchandiser. The only guarantee in medicine is that nothing is guaranteed. It is important to note that the decision to proceed with this intervention was based on the information collected from the patient. The Data and conclusions were drawn from the patient's questionnaire, the interview, and the physical examination. Because the information was provided in large part by the patient, it cannot be guaranteed that it has not been purposely or unconsciously manipulated. Every effort has been made to obtain as much relevant data as possible for this evaluation. It is important to note that the conclusions that lead to this procedure are derived in large part from the available data. Always take into account that the treatment will also be dependent on availability of resources and existing treatment guidelines, considered by other Pain Management Practitioners as being common knowledge and practice, at the time of the intervention. For Medico-Legal purposes, it is also important to point out that variation in procedural techniques and pharmacological choices  are the acceptable norm. The indications, contraindications, technique, and results of the above procedure should only be interpreted and judged by a Board-Certified Interventional Pain Specialist with extensive familiarity and expertise in the same exact procedure and technique.

## 2024-07-19 NOTE — Progress Notes (Signed)
 Safety precautions to be maintained throughout the outpatient stay will include: orient to surroundings, keep bed in low position, maintain call bell within reach at all times, provide assistance with transfer out of bed and ambulation.

## 2024-07-20 ENCOUNTER — Telehealth: Payer: Self-pay | Admitting: *Deleted

## 2024-07-20 ENCOUNTER — Other Ambulatory Visit (HOSPITAL_COMMUNITY): Payer: Self-pay

## 2024-07-20 NOTE — Telephone Encounter (Signed)
 Called for post procedure check. No answer. Call sounded like it went to voicemail but did not signal with a beep. Did not leave voicemail for that reason.

## 2024-07-22 ENCOUNTER — Other Ambulatory Visit (HOSPITAL_COMMUNITY): Payer: Self-pay

## 2024-07-24 ENCOUNTER — Other Ambulatory Visit: Payer: Self-pay

## 2024-07-24 NOTE — Progress Notes (Signed)
 Pharmacy Patient Advocate Encounter  Insurance verification completed.   The patient is insured through [facility]   Ran test claim for Prevymis . Co-pay is $0.  This test claim was processed through [facility]- copay amounts may vary at other pharmacies due to pharmacy/plan contracts, or as the patient moves through the different stages of their insurance plan.

## 2024-07-25 ENCOUNTER — Encounter (HOSPITAL_COMMUNITY): Payer: Self-pay

## 2024-07-25 ENCOUNTER — Other Ambulatory Visit: Payer: Self-pay

## 2024-07-25 ENCOUNTER — Other Ambulatory Visit (HOSPITAL_COMMUNITY): Payer: Self-pay

## 2024-07-25 NOTE — Progress Notes (Signed)
 Specialty Pharmacy Initiation Note   Jamie Coleman is a 53 y.o. female who will be followed by the specialty pharmacy service for RxSp Transplant    Review of administration, indication, effectiveness, safety, potential side effects, storage/disposable, and missed dose instructions occurred today for patient's specialty medication(s) Letermovir  (Prevymis )     Patient/Caregiver did not have any additional questions or concerns.   Patient's therapy is appropriate to: Continue    Goals Addressed             This Visit's Progress    Maintain optimal adherence to therapy   On track    Patient is on track. Patient will maintain adherence and adhere to provider and/or lab appointments. Patient has been on therapy for a month with no issues. CMV viral load currently undetectable as of 07/11/24         [facility] Specialty Pharmacist

## 2024-07-25 NOTE — Progress Notes (Signed)
 Specialty Pharmacy Initial Fill Coordination Note  Jamie Coleman is a 53 y.o. female contacted today regarding initial fill of specialty medication(s) Letermovir  (Prevymis )   Patient requested [name] at [facility] at [place] date: 07/26/24   Medication will be filled on 07/25/24.   Patient is aware of $0 copayment.

## 2024-07-26 ENCOUNTER — Other Ambulatory Visit: Payer: Self-pay

## 2024-07-27 ENCOUNTER — Other Ambulatory Visit (HOSPITAL_BASED_OUTPATIENT_CLINIC_OR_DEPARTMENT_OTHER): Payer: Self-pay

## 2024-07-27 MED ORDER — COMIRNATY 30 MCG/0.3ML IM SUSY
0.3000 mL | PREFILLED_SYRINGE | Freq: Once | INTRAMUSCULAR | 0 refills | Status: AC
  Filled 2024-07-27: qty 0.3, 1d supply, fill #0

## 2024-07-27 MED ORDER — FLUZONE HIGH-DOSE 0.5 ML IM SUSY
0.5000 mL | PREFILLED_SYRINGE | Freq: Once | INTRAMUSCULAR | 0 refills | Status: AC
  Filled 2024-07-27: qty 0.5, 1d supply, fill #0

## 2024-07-28 ENCOUNTER — Other Ambulatory Visit: Payer: Self-pay

## 2024-07-31 ENCOUNTER — Ambulatory Visit: Payer: 59 | Admitting: Obstetrics and Gynecology

## 2024-07-31 ENCOUNTER — Encounter: Admitting: Nurse Practitioner

## 2024-07-31 DIAGNOSIS — D849 Immunodeficiency, unspecified: Secondary | ICD-10-CM | POA: Diagnosis not present

## 2024-07-31 DIAGNOSIS — B259 Cytomegaloviral disease, unspecified: Secondary | ICD-10-CM | POA: Diagnosis not present

## 2024-07-31 DIAGNOSIS — Z944 Liver transplant status: Secondary | ICD-10-CM | POA: Diagnosis not present

## 2024-08-04 NOTE — Progress Notes (Unsigned)
 53 y.o. G66P1002 Married Caucasian female here for annual exam.  Wants to discuss HRT.  Labs at [facility] 06/20/24:  FSH 100.5 and estrogen less than 15.    Having issues with liver rejection.  Has CMV. Currently feeling good.  WBC 2.7 on 07/31/24.  On Prednisone  and is tapering down.  When she tapers, she is having hot flashes at night and having body aches.    Stopped Gabapentin  about 3 weeks ago.    May need back surgery.     Currently not working.   PCP: Randeen Laine LABOR, MD   Patient's last menstrual period was 05/31/2019 (within days).           Sexually active: Yes.    The current method of family planning is tubal ligation.    Menopausal hormone therapy:  n/a Exercising: Yes.    Walking  Smoker:  Former   OB History  Gravida Para Term Preterm AB Living  1 1 1   2   SAB IAB Ectopic Multiple Live Births     1 2    # Outcome Date GA Lbr Len/2nd Weight Sex Type Anes PTL Lv  1A Term 2000 [redacted]w[redacted]d   F  Other  LIV  1B Term 2000 [redacted]w[redacted]d   F  Other  LIV     HEALTH MAINTENANCE: Last 2 paps:  07/28/23 neg HR HPV neg, 07/16/21 neg HR HPV neg History of abnormal Pap or positive HPV:  yes Mammogram:   09/16/23 Breast Density Cat C, BIRADS Cat 1 neg  Colonoscopy:  02/09/22 Bone Density:  09/16/23  Result  osteopenia  FRAX 9.9%/1.1%  Immunization History  Administered Date(s) Administered   Hepatitis B 01/14/2005   INFLUENZA, HIGH DOSE SEASONAL PF 07/27/2024   Influenza Split 06/13/2011   Influenza Whole 08/10/2005   Influenza,inj,Quad PF,6+ Mos 07/12/2015, 07/13/2017, 07/03/2019   Influenza-Unspecified 07/29/2016, 07/13/2022   PFIZER Comirnaty(Gray Top)Covid-19 Tri-Sucrose Vaccine 05/07/2021   Pfizer(Comirnaty)Fall Seasonal Vaccine 12 years and older 07/27/2024   Td 01/14/2005      reports that she quit smoking about 41 years ago. Her smoking use included cigarettes. She has never used smokeless tobacco. She reports that she does not currently use alcohol  after a past usage of  about 14.0 standard drinks of alcohol  per week. She reports that she does not use drugs.  Past Medical History:  Diagnosis Date   Allergy    allergic rhinitis   Anxiety    situational   COVID 05/2021   Difficult intubation    states 2012 ablation surgery, had diff intubation,glide scope used, not noted in EPIC anesthesia record   Elevated hemoglobin A1c 08/08/2015   Elevated liver function tests 08/08/2015   GERD (gastroesophageal reflux disease)    Hyperlipidemia    Low serum vitamin D  08/08/2015   Status post endometrial ablation    Tennis elbow    currently in PT-10/16    Past Surgical History:  Procedure Laterality Date   BIOPSY  02/09/2022   Procedure: BIOPSY;  Surgeon: Wilhelmenia Aloha Raddle., MD;  Location: [facility];  Service: Gastroenterology;;   CESAREAN SECTION     COLONOSCOPY WITH PROPOFOL  N/A 02/09/2022   Procedure: COLONOSCOPY WITH PROPOFOL ;  Surgeon: Wilhelmenia Aloha Raddle., MD;  Location: [facility];  Service: Gastroenterology;  Laterality: N/A;   ENDOMETRIAL ABLATION     8/12   ESOPHAGOGASTRODUODENOSCOPY (EGD) WITH PROPOFOL  N/A 02/09/2022   Procedure: ESOPHAGOGASTRODUODENOSCOPY (EGD) WITH PROPOFOL ;  Surgeon: Wilhelmenia Aloha Raddle., MD;  Location: [facility];  Service: Gastroenterology;  Laterality: N/A;   EXTREMITY WIRE/PIN REMOVAL Left 07/16/2016   Procedure: PIN REMOVAL OF LEFT WRIST X 2;  Surgeon: Elsie Mussel, MD;  Location: [facility];  Service: Orthopedics;  Laterality: Left;   FOOT SURGERY Left    LAPAROSCOPIC TUBAL LIGATION  06/09/2011   Procedure: LAPAROSCOPIC TUBAL LIGATION;  Surgeon: Bobie DELENA Cary;  Location: [facility];  Service: Gynecology;  Laterality: N/A;   LIGAMENT REPAIR Left 05/08/2016   Procedure: LEFT WRIST SCAPHOLUNATE LIGAMENT RECONSTRUCTION WITH TENDON GRAFT PIN NEURECTOMY AND REPAIR;  Surgeon: Elsie Mussel, MD;  Location: [facility];  Service: Orthopedics;  Laterality: Left;   LIVER TRANSPLANT   09/2022   POLYPECTOMY  02/09/2022   Procedure: POLYPECTOMY;  Surgeon: Mansouraty, Aloha Raddle., MD;  Location: [facility];  Service: Gastroenterology;;   TONSILLECTOMY     as child    Current Outpatient Medications  Medication Sig Dispense Refill   aspirin 81 MG chewable tablet Chew by mouth daily.     Calcium  Citrate-Vitamin D  315-5 MG-MCG TABS Take by mouth.     cholecalciferol  (VITAMIN D3) 25 MCG (1000 UNIT) tablet Take 4,000 Units by mouth daily.     Continuous Glucose Sensor (FREESTYLE LIBRE 2 PLUS SENSOR) MISC Use 1 each as directed 6 each 3   filgrastim -aafi (NIVESTYM ) 480 MCG/0.8ML SOSY injection Inject 0.8 mLs (480 mcg total) subcutaneously once daily 0.8 mL 3   gabapentin  (NEURONTIN ) 100 MG capsule Take 1 capsule (100 mg total) by mouth 3 (three) times daily. In addition to the 300 mg pill 90 capsule 3   gabapentin  (NEURONTIN ) 300 MG capsule Take 1 capsule (300 mg total) by mouth 3 (three) times daily. 90 capsule 11   glucose blood (PRECISION QID TEST) test strip Use 1 strip to test 3 (three) times daily 100 each 1   Insulin  Pen Needle 31G X 5 MM MISC Use as directed 100 each 3   Insulin  Pen Needle 31G X 5 MM MISC Use as directed 100 each 5   Lancets (FREESTYLE) lancets use as directed three times daily. 100 each 1   Letermovir  (PREVYMIS ) 480 MG TABS Take 1 tablet (480 mg total) by mouth once daily 30 tablet 5   LORazepam  (ATIVAN ) 1 MG tablet Take 1 tablet (1 mg total) by mouth 2 (two) times daily as needed for anxiety. Caution of sedation 30 tablet 0   methocarbamol  (ROBAXIN ) 750 MG tablet Take 1 tablet (750 mg total) by mouth every 12 (twelve) hours as needed for muscle spasms. 60 tablet 0   mycophenolate  (CELLCEPT ) 250 MG capsule Take 6 capsules by mouth in the morning and 5 capsules in the evening. 310 capsule 11   omeprazole (PRILOSEC) 20 MG capsule Take 20 mg by mouth daily.     oxyCODONE  (OXY IR/ROXICODONE ) 5 MG immediate release tablet Take 1 tablet (5 mg total) by mouth  every 8 (eight) hours as needed for severe pain (pain score 7-10). Must last 30 days. 90 tablet 0   Polyvinyl Alcohol -Povidone (REFRESH OP) Place 1-2 drops into both eyes 4 (four) times daily as needed (dry eye).     predniSONE  (DELTASONE ) 1 MG tablet Take 5 tablets by mouth once daily for 14 days, THEN take 4 tablets once daily for 14 days, THEN take 3 tablets once daily for 14 days, THEN take 2 tablets once daily for 14 days, THEN take 1 tablet once daily for 14 days. 210 tablet 0   tacrolimus  (PROGRAF ) 1 MG capsule Take 2 capsules (2  mg total) by mouth in the morning AND 1 capsule (1 mg total) every evening. 90 capsule 1   insulin  lispro (HUMALOG ) 100 UNIT/ML KwikPen Inject 28 units with breakfast, 28 units with lunch, and 22 units with dinner in addition to sliding scale.  Max daily dose 110 units. (Patient not taking: Reported on 08/07/2024) 36 mL 5   Tbo-Filgrastim  (GRANIX ) 480 MCG/0.8ML SOSY injection Inject 0.73 mLs (438 mcg total) subcutaneously once daily (Patient not taking: Reported on 08/07/2024) 0.8 mL 3   No current facility-administered medications for this visit.    ALLERGIES: Lipitor [atorvastatin ]  Family History  Problem Relation Age of Onset   Cancer Mother        CA insitu of appendix   Heart disease Father        CAD   Diabetes Father        type II   Alzheimer's disease Father    Colon polyps Father    Diabetes Brother        type II   Diabetes Maternal Grandfather    Diabetes Paternal Grandmother    Diabetes Paternal Grandfather    Breast cancer Neg Hx    Colon cancer Neg Hx    Stomach cancer Neg Hx    Esophageal cancer Neg Hx    Pancreatic cancer Neg Hx     Review of Systems  All other systems reviewed and are negative.   PHYSICAL EXAM:  BP 112/62 (BP Location: Left Arm, Patient Position: Sitting)   Pulse 92   Ht 5' 3.25 (1.607 m)   Wt 201 lb (91.2 kg)   LMP 05/31/2019 (Within Days) Comment: spotting  SpO2 98%   BMI 35.32 kg/m     General  appearance: alert, cooperative and appears stated age Head: normocephalic, without obvious abnormality, atraumatic Neck: no adenopathy, supple, symmetrical, trachea midline and thyroid  normal to inspection and palpation Lungs: clear to auscultation bilaterally Breasts: right - normal appearance, right breast perioareolar mass 3 mm at 8:00.  No tenderness, No nipple retraction or dimpling, No nipple discharge or bleeding, No axillary adenopathy Left - normal appearance, no mass or tenderness, No nipple retraction or dimpling, No nipple discharge or bleeding, No axillary adenopathy Heart: regular rate and rhythm Abdomen: soft, non-tender; no masses, no organomegaly Extremities: extremities normal, atraumatic, no cyanosis or edema Skin: skin color, texture, turgor normal. No rashes or lesions Lymph nodes: cervical, supraclavicular, and axillary nodes normal. Neurologic: grossly normal  Pelvic: External genitalia:  no lesions              No abnormal inguinal nodes palpated.              Urethra:  normal appearing urethra with no masses, tenderness or lesions              Bartholins and Skenes: normal                 Vagina: normal appearing vagina with normal color and discharge, no lesions              Cervix: no lesions              Pap taken: yes Bimanual Exam:  Uterus:  normal size, contour, position, consistency, mobility, non-tender              Adnexa: no mass, fullness, tenderness              Rectal exam: yes.  Confirms.  Anus:  normal sphincter tone, no lesions  Chaperone was present for exam:  Kari HERO, CMA  ASSESSMENT: Well woman visit with gynecologic exam. Status post BTL and endometrial ablation.  Remote history of abnormal pap. Uncertain history of LEEP procedure.  Cervical cancer screening.  History of prior bilateral nipple discharge. Normal prolactin and TSH.  Hx elevated prolactin many years ago.  Status post liver transplant.  Immunosuppression.  Right  breast perioareolar mass 3 mm at 8:00.   Prior dx imaging benign.  Osteopenia.  PHQ-2-9: 0  PLAN: Mammogram screening discussed. Self breast awareness reviewed. Pap and HRV collected:  yes Guidelines for Calcium , Vitamin D , regular exercise program including cardiovascular and weight bearing exercise. Medication refills:  NA We discussed HRT, Paxil, Effexor, and Gabapentin  for treating menopausal symptoms.  We reviewed risks and benefits of HRT.  If HRT started, her transplant team would need to approve first.   Follow up:  yearly and prn.

## 2024-08-07 ENCOUNTER — Encounter: Payer: Self-pay | Admitting: Obstetrics and Gynecology

## 2024-08-07 ENCOUNTER — Ambulatory Visit: Admitting: Obstetrics and Gynecology

## 2024-08-07 ENCOUNTER — Other Ambulatory Visit (HOSPITAL_COMMUNITY)
Admission: RE | Admit: 2024-08-07 | Discharge: 2024-08-07 | Disposition: A | Discharge status: Home / Self Care | Source: Ambulatory Visit | Attending: Obstetrics and Gynecology | Admitting: Obstetrics and Gynecology

## 2024-08-07 VITALS — BP 112/62 | HR 92 | Ht 63.25 in | Wt 201.0 lb

## 2024-08-07 DIAGNOSIS — Z124 Encounter for screening for malignant neoplasm of cervix: Secondary | ICD-10-CM | POA: Diagnosis not present

## 2024-08-07 DIAGNOSIS — D849 Immunodeficiency, unspecified: Secondary | ICD-10-CM | POA: Insufficient documentation

## 2024-08-07 DIAGNOSIS — Z1331 Encounter for screening for depression: Secondary | ICD-10-CM | POA: Diagnosis not present

## 2024-08-07 DIAGNOSIS — Z01419 Encounter for gynecological examination (general) (routine) without abnormal findings: Secondary | ICD-10-CM

## 2024-08-07 NOTE — Patient Instructions (Signed)

## 2024-08-08 ENCOUNTER — Other Ambulatory Visit (HOSPITAL_COMMUNITY): Payer: Self-pay

## 2024-08-08 ENCOUNTER — Ambulatory Visit: Attending: Nurse Practitioner | Admitting: Nurse Practitioner

## 2024-08-08 ENCOUNTER — Encounter: Payer: Self-pay | Admitting: Nurse Practitioner

## 2024-08-08 ENCOUNTER — Other Ambulatory Visit: Payer: Self-pay

## 2024-08-08 VITALS — BP 113/84 | HR 92 | Temp 97.0°F | Ht 61.0 in | Wt 195.0 lb

## 2024-08-08 DIAGNOSIS — Z79899 Other long term (current) drug therapy: Secondary | ICD-10-CM | POA: Insufficient documentation

## 2024-08-08 DIAGNOSIS — M5416 Radiculopathy, lumbar region: Secondary | ICD-10-CM | POA: Diagnosis not present

## 2024-08-08 DIAGNOSIS — G894 Chronic pain syndrome: Secondary | ICD-10-CM | POA: Insufficient documentation

## 2024-08-08 DIAGNOSIS — G8929 Other chronic pain: Secondary | ICD-10-CM | POA: Diagnosis not present

## 2024-08-08 DIAGNOSIS — Z8719 Personal history of other diseases of the digestive system: Secondary | ICD-10-CM | POA: Diagnosis not present

## 2024-08-08 MED ORDER — OXYCODONE HCL 5 MG PO TABS
5.0000 mg | ORAL_TABLET | Freq: Three times a day (TID) | ORAL | 0 refills | Status: DC | PRN
  Filled 2024-09-20: qty 90, 30d supply, fill #0

## 2024-08-08 MED ORDER — METHOCARBAMOL 750 MG PO TABS
750.0000 mg | ORAL_TABLET | Freq: Two times a day (BID) | ORAL | 2 refills | Status: AC | PRN
  Filled 2024-08-08: qty 60, 30d supply, fill #0
  Filled 2024-09-19: qty 60, 30d supply, fill #1
  Filled 2024-10-27: qty 60, 30d supply, fill #2

## 2024-08-08 MED ORDER — OXYCODONE HCL 5 MG PO TABS
5.0000 mg | ORAL_TABLET | Freq: Three times a day (TID) | ORAL | 0 refills | Status: AC | PRN
  Filled 2024-08-21: qty 90, 30d supply, fill #0

## 2024-08-08 NOTE — Progress Notes (Signed)
 Safety precautions to be maintained throughout the outpatient stay will include: orient to surroundings, keep bed in low position, maintain call bell within reach at all times, provide assistance with transfer out of bed and ambulation.   Nursing Pain Medication Assessment:  Safety precautions to be maintained throughout the outpatient stay will include: orient to surroundings, keep bed in low position, maintain call bell within reach at all times, provide assistance with transfer out of bed and ambulation.  Medication Inspection Compliance: Pill count conducted under aseptic conditions, in front of the patient. Neither the pills nor the bottle was removed from the patient's sight at any time. Once count was completed pills were immediately returned to the patient in their original bottle.  Medication: Oxycodone  IR Pill/Patch Count: 61 of 90 pills/patches remain Pill/Patch Appearance: Markings consistent with prescribed medication Bottle Appearance: Standard pharmacy container. Clearly labeled. Filled Date: 4 / 11 / 2025 Last Medication intake:  Today

## 2024-08-08 NOTE — Progress Notes (Signed)
 PROVIDER NOTE: Interpretation of information contained herein should be left to medically-trained personnel. Specific patient instructions are provided elsewhere under Patient Instructions section of medical record. This document was created in part using AI and STT-dictation technology, any transcriptional errors that may result from this process are unintentional.  Patient: Jamie Coleman  Service: E/M   PCP: Randeen Laine LABOR, MD  DOB: [birth year]  DOS: 08/08/2024  Provider: Emmy MARLA Blanch, NP  MRN: [id number]  Delivery: Face-to-face  Specialty: Interventional Pain Management  Type: Established Patient  Setting: Ambulatory outpatient facility  Specialty designation: 09  Referring Prov.: Tower, Laine LABOR, MD  Location: Outpatient office facility       History of present illness (HPI) Jamie Coleman, a 53 y.o. year old female, is here today because of her Chronic radicular lumbar pain [M54.16, G89.29]. Jamie Coleman primary complain today is Back Pain (Right sided lower back)  Pertinent problems: Jamie Coleman has Acute on chronic alcoholic liver disease (HCC); Decompensation of cirrhosis of liver (HCC); History of alcoholic hepatitis; Estrogen deficiency; Multiple joint pain; Menopause; H/O liver transplant (HCC); On prednisone  therapy; Osteopenia; Low back pain; Lumbar spinal stenosis; and Lumbar radiculopathy on their pertinent problem list.  Pain Assessment: Severity of Chronic pain is reported as a 2 /10. Location: Back Lower, Right/denies. Onset: More than a month ago. Quality: Tightness. Timing: Constant. Modifying factor(s): meds, rest. Vitals:  height is 5' 1 (1.549 m) and weight is 195 lb (88.5 kg). Her temperature is 97 F (36.1 C) (abnormal). Her blood pressure is 113/84 and her pulse is 92. Her oxygen saturation is 100%.  BMI: Estimated body mass index is 36.84 kg/m as calculated from the following:   Height as of this encounter: 5' 1 (1.549 m).   Weight as of this encounter:  195 lb (88.5 kg).  Last encounter: 07/04/2024. Last procedure: Visit date not found.  Reason for encounter: both, medication management and post-procedure evaluation and assessment. No change in medical history since last visit.  Patient's pain is at baseline.  Patient continues multimodal pain regimen as prescribed.  States that it provides pain relief and improvement in functional status.   Jamie Coleman underwent a diagnostic/therapeutic lumbar transforaminal epidural steroid injection on July 19, 2024.  She reports 100% pain relief and functional improvement during local anesthetic phase, followed sustain 90% ongoing pain relief and functional improvement since the procedure.   Discussed the use of AI scribe software for clinical note transcription with the patient, who gave verbal consent to proceed.  History of Present Illness   Jamie Coleman is a 53 year old female with chronic back pain who presents for pain management follow-up.  She manages her chronic back pain primarily with oxycodone , preferring it over hydrocodone  due to its compatibility with acetaminophen  and reduced sedative effects. Oxycodone  causes tiredness but is less impairing than hydrocodone , which previously affected her functionality. She experiences no constipation, likely due to magnesium  supplementation.  She recently received an epidural steroid injection, which has decreased her reliance on oxycodone . The first injection provided complete relief for four weeks, but the pain gradually returned. A subsequent injection in June was ineffective, but the most recent one has been beneficial. She is planning a trip to [facility] in December and is concerned about managing her pain during the trip, planning to use a wheelchair for mobility.  Currently, she is not taking methocarbamol , oxycodone , or gabapentin  regularly but has them available if needed. She has a history of using gabapentin  for  pain management and plans to use  it with oxycodone  if her pain returns. She reports having recent laboratory tests and is not aware of any kidney issues. She spaces out her medications to monitor their effects and avoid adverse interactions. No drowsiness or constipation reported to current pain medication regimen.     Procedure Procedure: Lumbar trans-foraminal epidural steroid injection (L-TFESI) #1  Laterality: Bilateral (-50)  Level: L3 nerve root(s) Imaging: Fluoroscopy-guided         Anesthesia: Local anesthesia (1-2% Lidocaine ) DOS: 07/19/2024  Performed by: Wallie Sherry, MD   Purpose: Diagnostic/Therapeutic Indications: Lumbar radicular pain severe enough to impact quality of life or function. 1. Chronic radicular lumbar pain   2. Chronic pain syndrome   3. Lumbar radiculopathy     NAS-11 Pain score:        Pre-procedure: 4 /10        Post-procedure: 4 /10   Post-Procedure Evaluation   Effectiveness:  Initial hour after procedure: 100 % . Subsequent 4-6 hours post-procedure: 100 % . Analgesia past initial 6 hours: 90 % . Ongoing improvement:  Analgesic:  Jamie Coleman underwent a diagnostic/therapeutic lumbar transforaminal epidural steroid injection on July 19, 2024.  She reports 100% pain relief and functional improvement during local anesthetic phase, followed sustain 90% ongoing pain relief and functional improvement since the procedure.  Function: Jamie Coleman reports improvement in function ROM: Jamie Coleman reports improvement in ROM   Pharmacotherapy Assessment   Started oxycodone  (oxy IR/oxycodone ) 5 mg immediate release every 8 hours as needed for pain. Methocarbamol  (Robaxin ) 750 mg tablet every 12 hours as needed for muscle spasm.  Monitoring: Lead PMP: PDMP reviewed during this encounter.       Pharmacotherapy: No side-effects or adverse reactions reported. Compliance: No problems identified. Effectiveness: Clinically acceptable.  Margrette Nathanel PARAS, RN  08/08/2024  9:58 AM  Sign when  Signing Visit Safety precautions to be maintained throughout the outpatient stay will include: orient to surroundings, keep bed in low position, maintain call bell within reach at all times, provide assistance with transfer out of bed and ambulation.   Nursing Pain Medication Assessment:  Safety precautions to be maintained throughout the outpatient stay will include: orient to surroundings, keep bed in low position, maintain call bell within reach at all times, provide assistance with transfer out of bed and ambulation.  Medication Inspection Compliance: Pill count conducted under aseptic conditions, in front of the patient. Neither the pills nor the bottle was removed from the patient's sight at any time. Once count was completed pills were immediately returned to the patient in their original bottle.  Medication: Oxycodone  IR Pill/Patch Count: 61 of 90 pills/patches remain Pill/Patch Appearance: Markings consistent with prescribed medication Bottle Appearance: Standard pharmacy container. Clearly labeled. Filled Date: 35 / 11 / 2025 Last Medication intake:  Today    UDS:  Summary  Date Value Ref Range Status  05/11/2024 FINAL  Final    Comment:    ==================================================================== Compliance Drug Analysis, Ur ==================================================================== Test                             Result       Flag       Units  Drug Present and Declared for Prescription Verification   Gabapentin                      PRESENT      EXPECTED   Methocarbamol   PRESENT      EXPECTED  Drug Present not Declared for Prescription Verification   Acetaminophen                   PRESENT      UNEXPECTED   Diphenhydramine                 PRESENT      UNEXPECTED  Drug Absent but Declared for Prescription Verification   Lorazepam                       Not Detected UNEXPECTED ng/mg creat   Tramadol                        Not Detected  UNEXPECTED ng/mg creat   Salicylate                     Not Detected UNEXPECTED    Aspirin, as indicated in the declared medication list, is not always    detected even when used as directed.  ==================================================================== Test                      Result    Flag   Units      Ref Range   Creatinine              160              mg/dL      >=79 ==================================================================== Declared Medications:  The flagging and interpretation on this report are based on the  following declared medications.  Unexpected results may arise from  inaccuracies in the declared medications.   **Note: The testing scope of this panel includes these medications:   Gabapentin   Lorazepam   Methocarbamol   Tramadol    **Note: The testing scope of this panel does not include small to  moderate amounts of these reported medications:   Aspirin   **Note: The testing scope of this panel does not include the  following reported medications:   Calcium   Clotrimazole   Eye Drops  Filgrastim  (Nivestym )  Insulin   Mycophenolate  mofetil (Cellcept )  Omeprazole  Prednisone   Sulfamethoxazole  (Bactrim )  Tacrolimus   Tbo-filgrastim   Trimethoprim  (Bactrim )  Valganciclovir   Vitamin Coleman  ==================================================================== For clinical consultation, please call [phone]. ====================================================================     No results found for: CBDTHCR No results found for: D8THCCBX No results found for: D9THCCBX  ROS  Constitutional: Denies any fever or chills Gastrointestinal: No reported hemesis, hematochezia, vomiting, or acute GI distress Musculoskeletal:  Back Pain (Right sided lower back) Neurological: No reported episodes of acute onset apraxia, aphasia, dysarthria, agnosia, amnesia, paralysis, loss of coordination, or loss of consciousness  Medication Review   Calcium  Citrate-Vitamin Coleman , FreeStyle Libre 2 Plus Sensor, Insulin  Pen Needle, LORazepam , Letermovir , Polyvinyl Alcohol -Povidone, Tbo-Filgrastim , aspirin, cholecalciferol , filgrastim -aafi, freestyle, gabapentin , glucose blood, insulin  lispro, methocarbamol , mycophenolate , omeprazole, oxyCODONE , predniSONE , and tacrolimus   History Review  Allergy: Jamie Coleman is allergic to lipitor [atorvastatin ]. Drug: Jamie Coleman  reports no history of drug use. Alcohol :  reports that she does not currently use alcohol  after a past usage of about 14.0 standard drinks of alcohol  per week. Tobacco:  reports that she quit smoking about 41 years ago. Her smoking use included cigarettes. She has never used smokeless tobacco. Social: Jamie Coleman  reports that she quit smoking about 41 years ago. Her smoking use included cigarettes. She has never used smokeless tobacco. She reports that she does not currently use  alcohol  after a past usage of about 14.0 standard drinks of alcohol  per week. She reports that she does not use drugs. Medical:  has a past medical history of Allergy, Anxiety, COVID (05/2021), Difficult intubation, Elevated hemoglobin A1c (08/08/2015), Elevated liver function tests (08/08/2015), GERD (gastroesophageal reflux disease), Hyperlipidemia, Low serum vitamin Coleman  (08/08/2015), Status post endometrial ablation, and Tennis elbow. Surgical: Jamie Coleman  has a past surgical history that includes Cesarean section; Foot surgery (Left); Tonsillectomy; Endometrial ablation; Laparoscopic tubal ligation (06/09/2011); Ligament repair (Left, 05/08/2016); Extremity wire/pin removal (Left, 07/16/2016); Colonoscopy with propofol  (N/A, 02/09/2022); Esophagogastroduodenoscopy (egd) with propofol  (N/A, 02/09/2022); biopsy (02/09/2022); polypectomy (02/09/2022); and Liver transplant (09/2022). Family: family history includes Alzheimer's disease in her father; Cancer in her mother; Colon polyps in her father; Diabetes in  her brother, father, maternal grandfather, paternal grandfather, and paternal grandmother; Heart disease in her father.  Laboratory Chemistry Profile   Renal Lab Results  Component Value Date   BUN 43 (H) 09/25/2022   CREATININE 1.2 (A) 03/09/2024   GFR 34.93 (L) 09/25/2022   GFRAA >60 07/15/2020   GFRNONAA >60 09/22/2022    Hepatic Lab Results  Component Value Date   AST 33 03/09/2024   ALT 61 (A) 03/09/2024   ALBUMIN  3.0 (L) 09/25/2022   ALKPHOS 300 (H) 09/25/2022   HCVAB NON REACTIVE 09/18/2021   LIPASE 59 (H) 09/21/2022   AMMONIA 66 (H) 09/25/2022    Electrolytes Lab Results  Component Value Date   NA 122 (L) 09/25/2022   K 5.3 (H) 09/25/2022   CL 89 (L) 09/25/2022   CALCIUM  9.4 09/25/2022   MG 2.4 09/07/2022   PHOS 3.1 09/17/2021    Bone Lab Results  Component Value Date   VD25OH 62.42 08/30/2019    Inflammation (CRP: Acute Phase) (ESR: Chronic Phase) Lab Results  Component Value Date   CRP 20.1 (H) 09/03/2022   ESRSEDRATE 5 02/15/2023   LATICACIDVEN 1.2 09/21/2022         Note: Above Lab results reviewed.  Recent Imaging Review  DG PAIN CLINIC C-ARM 1-60 MIN NO REPORT Fluoro was used, but no Radiologist interpretation will be provided.  Please refer to NOTES tab for provider progress note. Note: Reviewed        Physical Exam  Vitals: BP 113/84   Pulse 92   Temp (!) 97 F (36.1 C)   Ht 5' 1 (1.549 m)   Wt 195 lb (88.5 kg)   LMP 05/31/2019 (Within Days) Comment: spotting  SpO2 100%   BMI 36.84 kg/m  BMI: Estimated body mass index is 36.84 kg/m as calculated from the following:   Height as of this encounter: 5' 1 (1.549 m).   Weight as of this encounter: 195 lb (88.5 kg). Ideal: Ideal body weight: 47.8 kg (105 lb 6.1 oz) Adjusted ideal body weight: 64.1 kg (141 lb 3.6 oz) General appearance: Well nourished, well developed, and well hydrated. In no apparent acute distress Mental status: Alert, oriented x 3 (person, place, & time)        Respiratory: No evidence of acute respiratory distress Eyes: PERLA  Musculoskeletal: +LBP Thoracic Spine Area Exam  Skin & Axial Inspection: No masses, redness, or swelling Alignment: Symmetrical Functional ROM: Unrestricted ROM Stability: No instability detected Muscle Tone/Strength: Functionally intact. No obvious neuro-muscular anomalies detected. Sensory (Neurological): Unimpaired Muscle strength & Tone: No palpable anomalies Lumbar Spine Area Exam  Skin & Axial Inspection: No masses, redness, or swelling Alignment: Symmetrical Functional ROM: Limited ROM  Stability: No instability detected Muscle Tone/Strength: Functionally intact. No obvious neuro-muscular anomalies detected. Sensory (Neurological): Dermatomal pain pattern Palpation: No palpable anomalies       Provocative Tests: Hyperextension/rotation test: (+) due to pain. Lumbar quadrant test (Kemp's test): (+) bilateral for foraminal stenosis   Gait & Posture Assessment  Ambulation: Unassisted Gait: Relatively normal for age and body habitus Posture: WNL  Assessment   Diagnosis Status  1. Chronic radicular lumbar pain   2. Lumbar radiculopathy   3. Chronic pain syndrome   4. History of alcoholic hepatitis   5. Medication management    Improved Improved Stable   Updated Problems: No problems updated.  Plan of Care  Problem-specific:  Assessment and Plan    Chronic low back pain Chronic low back pain with intermittent exacerbations managed with oxycodone  and recent epidural steroid injection. Considering back surgery pending neurosurgical evaluation. - Prescribe oxycodone  for two months and send prescription to [facility]. - Recommend using a wheelchair at [facility] to manage pain during prolonged walking and standing. - Schedule follow-up for medication management in January.  Chronic radicular lumbar pain: She recently received an epidural steroid injection, which  has decreased her reliance on oxycodone . The first injection provided complete relief for four weeks, but the pain gradually returned. A subsequent injection in June was ineffective, but the most recent one has been beneficial with ongoing 90% pain relief and functional improvement.   Chronic pain syndrome: Patient's pain is well-controlled with oxycodone , will continue on current medication regimen.  Prescribing drug monitoring (PMP) reviewed, findings consistent with the use of prescribed medication and no evidence of narcotic misuse or abuse.  Urine drug screening (UDS) up to date and consistent with the use of prescribed medication.  Schedule follow-up in 90 days for medication management.  The patient was advised to walk for at least 30-minute with intermittent rest break to improve functional mobility.   Ms. CALLEY DRENNING has a current medication list which includes the following long-term medication(s): calcium  citrate-vitamin Coleman , gabapentin , gabapentin , insulin  lispro, and omeprazole.  Pharmacotherapy (Medications Ordered): Meds ordered this encounter  Medications   oxyCODONE  (OXY IR/ROXICODONE ) 5 MG immediate release tablet    Sig: Take 1 tablet (5 mg total) by mouth every 8 (eight) hours as needed for severe pain (pain score 7-10). Must last 30 days.    Dispense:  90 tablet    Refill:  0    Chronic Pain: STOP Act (Not applicable) Fill 1 day early if closed on refill date. Avoid benzodiazepines within 8 hours of opioids   oxyCODONE  (OXY IR/ROXICODONE ) 5 MG immediate release tablet    Sig: Take 1 tablet (5 mg total) by mouth every 8 (eight) hours as needed for severe pain (pain score 7-10). Must last 30 days.    Dispense:  90 tablet    Refill:  0    Chronic Pain: STOP Act (Not applicable) Fill 1 day early if closed on refill date. Avoid benzodiazepines within 8 hours of opioids   methocarbamol  (ROBAXIN ) 750 MG tablet    Sig: Take 1 tablet (750 mg total) by mouth every 12 (twelve)  hours as needed for muscle spasms.    Dispense:  60 tablet    Refill:  2   Orders:  No orders of the defined types were placed in this encounter.       Return in about 2 months (around 10/08/2024) for (F2F), (MM), Emmy Blanch NP.    Recent Visits Date  Type Provider Dept  07/19/24 Procedure visit Marcelino Nurse, MD [facility]  07/04/24 Office Visit Ahmar Pickrell K, NP [facility]  05/17/24 Office Visit Marcelino Nurse, MD [facility]  05/11/24 Office Visit Marcelino Nurse, MD [facility]  Showing recent visits within past 90 days and meeting all other requirements Today's Visits Date Type Provider Dept  08/08/24 Office Visit Juliani Laduke K, NP [facility]  Showing today's visits and meeting all other requirements Future Appointments Date Type Provider Dept  10/03/24 Appointment Jamea Robicheaux K, NP [facility]  Showing future appointments within next 90 days and meeting all other requirements  I discussed the assessment and treatment plan with the patient. The patient was provided an opportunity to ask questions and all were answered. The patient agreed with the plan and demonstrated an understanding of the instructions.  Patient advised to call back or seek an in-person evaluation if the symptoms or condition worsens.  I personally spent a total of 30 minutes in the care of the patient today including preparing to see the patient, getting/reviewing separately obtained history, performing a medically appropriate exam/evaluation, counseling and educating, placing orders, referring and communicating with other health care professionals, documenting clinical information in the EHR, independently interpreting results, communicating results, and coordinating care.  Note by: Candee Hoon K Marton Malizia, NP (TTS and AI technology used. I apologize for any typographical errors that were not detected and corrected.) Date: 08/08/2024; Time: 10:35 AM

## 2024-08-09 LAB — CYTOLOGY - PAP
Comment: NEGATIVE
Diagnosis: UNDETERMINED — AB
High risk HPV: NEGATIVE

## 2024-08-10 ENCOUNTER — Ambulatory Visit: Payer: Self-pay | Admitting: Obstetrics and Gynecology

## 2024-08-10 ENCOUNTER — Other Ambulatory Visit (HOSPITAL_COMMUNITY): Payer: Self-pay

## 2024-08-15 DIAGNOSIS — Z944 Liver transplant status: Secondary | ICD-10-CM | POA: Diagnosis not present

## 2024-08-15 DIAGNOSIS — B259 Cytomegaloviral disease, unspecified: Secondary | ICD-10-CM | POA: Diagnosis not present

## 2024-08-15 DIAGNOSIS — D849 Immunodeficiency, unspecified: Secondary | ICD-10-CM | POA: Diagnosis not present

## 2024-08-16 ENCOUNTER — Other Ambulatory Visit: Payer: Self-pay | Admitting: Pharmacy Technician

## 2024-08-16 ENCOUNTER — Other Ambulatory Visit: Payer: Self-pay

## 2024-08-16 ENCOUNTER — Other Ambulatory Visit (HOSPITAL_COMMUNITY): Payer: Self-pay

## 2024-08-16 ENCOUNTER — Encounter (INDEPENDENT_AMBULATORY_CARE_PROVIDER_SITE_OTHER): Payer: Self-pay

## 2024-08-16 NOTE — Progress Notes (Signed)
 Specialty Pharmacy Refill Coordination Note  Jamie Coleman is a 53 y.o. female contacted today regarding refills of specialty medication(s) Letermovir  (Prevymis )   Patient requested (Patient-Rptd) Pickup at [facility] at [facility] date: (Patient-Rptd) 08/22/24   Medication will be filled on: 08/21/2024

## 2024-08-17 ENCOUNTER — Other Ambulatory Visit (HOSPITAL_COMMUNITY): Payer: Self-pay

## 2024-08-17 MED ORDER — MYCOPHENOLATE MOFETIL 250 MG PO CAPS
ORAL_CAPSULE | ORAL | 3 refills | Status: DC
  Filled 2024-08-17: qty 900, 90d supply, fill #0

## 2024-08-18 ENCOUNTER — Other Ambulatory Visit (HOSPITAL_COMMUNITY): Payer: Self-pay

## 2024-08-21 ENCOUNTER — Other Ambulatory Visit (HOSPITAL_COMMUNITY): Payer: Self-pay

## 2024-08-21 ENCOUNTER — Other Ambulatory Visit: Payer: Self-pay

## 2024-08-23 DIAGNOSIS — K7030 Alcoholic cirrhosis of liver without ascites: Secondary | ICD-10-CM | POA: Diagnosis not present

## 2024-08-23 DIAGNOSIS — F419 Anxiety disorder, unspecified: Secondary | ICD-10-CM | POA: Diagnosis not present

## 2024-08-23 DIAGNOSIS — Z944 Liver transplant status: Secondary | ICD-10-CM | POA: Diagnosis not present

## 2024-08-23 DIAGNOSIS — B259 Cytomegaloviral disease, unspecified: Secondary | ICD-10-CM | POA: Diagnosis not present

## 2024-08-23 DIAGNOSIS — Z8659 Personal history of other mental and behavioral disorders: Secondary | ICD-10-CM | POA: Diagnosis not present

## 2024-08-23 DIAGNOSIS — D84821 Immunodeficiency due to drugs: Secondary | ICD-10-CM | POA: Diagnosis not present

## 2024-08-23 DIAGNOSIS — D849 Immunodeficiency, unspecified: Secondary | ICD-10-CM | POA: Diagnosis not present

## 2024-08-23 DIAGNOSIS — T8641 Liver transplant rejection: Secondary | ICD-10-CM | POA: Diagnosis not present

## 2024-08-23 DIAGNOSIS — Z79624 Long term (current) use of inhibitors of nucleotide synthesis: Secondary | ICD-10-CM | POA: Diagnosis not present

## 2024-08-24 ENCOUNTER — Other Ambulatory Visit: Payer: Self-pay | Admitting: Family Medicine

## 2024-08-25 ENCOUNTER — Other Ambulatory Visit (HOSPITAL_COMMUNITY): Payer: Self-pay

## 2024-08-25 MED ORDER — LORAZEPAM 1 MG PO TABS
1.0000 mg | ORAL_TABLET | Freq: Two times a day (BID) | ORAL | 0 refills | Status: AC | PRN
  Filled 2024-08-25: qty 30, 15d supply, fill #0

## 2024-08-25 NOTE — Telephone Encounter (Signed)
 Name of Medication: Ativan  Name of [facility]acy: [facility] Last Fill or Written Date and Quantity: 02/09/24 #30 tab/ 0 refill Last Office Visit and Type: Skin issues on 06/13/24 Next Office Visit and Type: none scheduled

## 2024-08-26 ENCOUNTER — Other Ambulatory Visit (HOSPITAL_COMMUNITY): Payer: Self-pay

## 2024-08-29 ENCOUNTER — Other Ambulatory Visit (HOSPITAL_COMMUNITY): Payer: Self-pay

## 2024-08-29 DIAGNOSIS — D849 Immunodeficiency, unspecified: Secondary | ICD-10-CM | POA: Diagnosis not present

## 2024-08-29 DIAGNOSIS — E139 Other specified diabetes mellitus without complications: Secondary | ICD-10-CM | POA: Diagnosis not present

## 2024-08-29 DIAGNOSIS — E2749 Other adrenocortical insufficiency: Secondary | ICD-10-CM | POA: Diagnosis not present

## 2024-08-29 DIAGNOSIS — Z944 Liver transplant status: Secondary | ICD-10-CM | POA: Diagnosis not present

## 2024-08-29 MED ORDER — PREDNISONE 1 MG PO TABS
ORAL_TABLET | ORAL | 1 refills | Status: DC
  Filled 2024-08-29: qty 400, 30d supply, fill #0
  Filled 2024-09-19: qty 400, fill #0

## 2024-09-04 ENCOUNTER — Other Ambulatory Visit (HOSPITAL_BASED_OUTPATIENT_CLINIC_OR_DEPARTMENT_OTHER): Payer: Self-pay

## 2024-09-04 ENCOUNTER — Other Ambulatory Visit (HOSPITAL_COMMUNITY): Payer: Self-pay

## 2024-09-04 MED ORDER — PREVNAR 20 0.5 ML IM SUSY
PREFILLED_SYRINGE | INTRAMUSCULAR | 0 refills | Status: AC
  Filled 2024-09-04: qty 0.5, 1d supply, fill #0

## 2024-09-04 MED ORDER — AREXVY 120 MCG/0.5ML IM SUSR
0.5000 mL | Freq: Once | INTRAMUSCULAR | 0 refills | Status: AC
  Filled 2024-09-04: qty 0.5, 28d supply, fill #0
  Filled 2024-09-06 (×2): qty 0.5, 1d supply, fill #0

## 2024-09-04 MED ORDER — AREXVY 120 MCG/0.5ML IM SUSR
INTRAMUSCULAR | 0 refills | Status: AC
  Filled 2024-09-04: qty 1, 1d supply, fill #0

## 2024-09-06 ENCOUNTER — Other Ambulatory Visit (HOSPITAL_BASED_OUTPATIENT_CLINIC_OR_DEPARTMENT_OTHER): Payer: Self-pay

## 2024-09-08 ENCOUNTER — Other Ambulatory Visit (HOSPITAL_BASED_OUTPATIENT_CLINIC_OR_DEPARTMENT_OTHER): Payer: Self-pay

## 2024-09-11 ENCOUNTER — Other Ambulatory Visit: Payer: Self-pay

## 2024-09-11 ENCOUNTER — Other Ambulatory Visit (HOSPITAL_BASED_OUTPATIENT_CLINIC_OR_DEPARTMENT_OTHER): Payer: Self-pay

## 2024-09-11 ENCOUNTER — Other Ambulatory Visit (HOSPITAL_COMMUNITY): Payer: Self-pay

## 2024-09-13 ENCOUNTER — Other Ambulatory Visit: Payer: Self-pay

## 2024-09-13 NOTE — Progress Notes (Signed)
 Specialty Pharmacy Refill Coordination Note  Jamie Coleman is a 53 y.o. female contacted today regarding refills of specialty medication(s) Letermovir  (Prevymis )   Patient requested [name] at [facility] [place] date: 09/15/24   Medication will be filled on: 09/14/24

## 2024-09-14 ENCOUNTER — Other Ambulatory Visit (HOSPITAL_COMMUNITY): Payer: Self-pay

## 2024-09-14 ENCOUNTER — Other Ambulatory Visit: Payer: Self-pay

## 2024-09-18 ENCOUNTER — Other Ambulatory Visit: Payer: Self-pay | Admitting: Obstetrics and Gynecology

## 2024-09-18 DIAGNOSIS — Z1231 Encounter for screening mammogram for malignant neoplasm of breast: Secondary | ICD-10-CM

## 2024-09-19 ENCOUNTER — Other Ambulatory Visit (HOSPITAL_COMMUNITY): Payer: Self-pay

## 2024-09-19 ENCOUNTER — Other Ambulatory Visit: Payer: Self-pay

## 2024-09-19 DIAGNOSIS — Z944 Liver transplant status: Secondary | ICD-10-CM | POA: Diagnosis not present

## 2024-09-19 DIAGNOSIS — D849 Immunodeficiency, unspecified: Secondary | ICD-10-CM | POA: Diagnosis not present

## 2024-09-19 MED ORDER — TACROLIMUS 1 MG PO CAPS
1.0000 mg | ORAL_CAPSULE | Freq: Two times a day (BID) | ORAL | 11 refills | Status: DC
  Filled 2024-09-19 – 2024-09-20 (×2): qty 90, 45d supply, fill #0

## 2024-09-20 ENCOUNTER — Other Ambulatory Visit (HOSPITAL_COMMUNITY): Payer: Self-pay

## 2024-09-20 MED ORDER — PREDNISONE 1 MG PO TABS
ORAL_TABLET | ORAL | 1 refills | Status: AC
  Filled 2024-09-20: qty 273, 84d supply, fill #0

## 2024-10-02 DIAGNOSIS — Z944 Liver transplant status: Secondary | ICD-10-CM | POA: Diagnosis not present

## 2024-10-02 DIAGNOSIS — D849 Immunodeficiency, unspecified: Secondary | ICD-10-CM | POA: Diagnosis not present

## 2024-10-02 DIAGNOSIS — F1011 Alcohol abuse, in remission: Secondary | ICD-10-CM | POA: Diagnosis not present

## 2024-10-03 ENCOUNTER — Encounter: Payer: Self-pay | Admitting: Nurse Practitioner

## 2024-10-03 ENCOUNTER — Ambulatory Visit: Attending: Nurse Practitioner | Admitting: Nurse Practitioner

## 2024-10-03 ENCOUNTER — Other Ambulatory Visit (HOSPITAL_COMMUNITY): Payer: Self-pay

## 2024-10-03 ENCOUNTER — Other Ambulatory Visit: Payer: Self-pay

## 2024-10-03 VITALS — BP 97/63 | HR 79 | Temp 97.3°F | Resp 16 | Ht 61.0 in | Wt 195.0 lb

## 2024-10-03 DIAGNOSIS — Z944 Liver transplant status: Secondary | ICD-10-CM | POA: Insufficient documentation

## 2024-10-03 DIAGNOSIS — M5416 Radiculopathy, lumbar region: Secondary | ICD-10-CM | POA: Insufficient documentation

## 2024-10-03 DIAGNOSIS — Z8719 Personal history of other diseases of the digestive system: Secondary | ICD-10-CM | POA: Diagnosis not present

## 2024-10-03 DIAGNOSIS — Z79899 Other long term (current) drug therapy: Secondary | ICD-10-CM | POA: Diagnosis not present

## 2024-10-03 DIAGNOSIS — G8929 Other chronic pain: Secondary | ICD-10-CM | POA: Insufficient documentation

## 2024-10-03 DIAGNOSIS — G894 Chronic pain syndrome: Secondary | ICD-10-CM | POA: Diagnosis not present

## 2024-10-03 MED ORDER — GABAPENTIN 100 MG PO CAPS
100.0000 mg | ORAL_CAPSULE | Freq: Three times a day (TID) | ORAL | 3 refills | Status: AC
  Filled 2024-10-03: qty 90, 30d supply, fill #0

## 2024-10-03 MED ORDER — MYCOPHENOLATE MOFETIL 250 MG PO CAPS: ORAL_CAPSULE | ORAL | 3 refills | Status: AC

## 2024-10-03 MED ORDER — OXYCODONE HCL 5 MG PO TABS: 5.0000 mg | ORAL_TABLET | Freq: Three times a day (TID) | ORAL | 0 refills | Status: AC | PRN

## 2024-10-03 MED ORDER — OXYCODONE HCL 5 MG PO TABS
5.0000 mg | ORAL_TABLET | Freq: Three times a day (TID) | ORAL | 0 refills | Status: AC | PRN
  Filled 2024-10-27: qty 90, 30d supply, fill #0

## 2024-10-03 NOTE — Progress Notes (Signed)
 Nursing Pain Medication Assessment:  Safety precautions to be maintained throughout the outpatient stay will include: orient to surroundings, keep bed in low position, maintain call bell within reach at all times, provide assistance with transfer out of bed and ambulation.  Medication Inspection Compliance: Pill count conducted under aseptic conditions, in front of the patient. Neither the pills nor the bottle was removed from the patient's sight at any time. Once count was completed pills were immediately returned to the patient in their original bottle.  Medication: Oxycodone  IR Pill/Patch Count: 90 of 90 pills/patches remain Pill/Patch Appearance: Markings consistent with prescribed medication Bottle Appearance: Standard pharmacy container. Clearly labeled. Filled Date: 12 / 10 / 2025 Last Medication intake:  Day before yesterdaySafety precautions to be maintained throughout the outpatient stay will include: orient to surroundings, keep bed in low position, maintain call bell within reach at all times, provide assistance with transfer out of bed and ambulation.

## 2024-10-03 NOTE — Progress Notes (Signed)
 PROVIDER NOTE: Interpretation of information contained herein should be left to medically-trained personnel. Specific patient instructions are provided elsewhere under Patient Instructions section of medical record. This document was created in part using AI and STT-dictation technology, any transcriptional errors that may result from this process are unintentional.  Patient: Jamie Coleman  Service: E/M   PCP: Randeen Laine LABOR, MD  DOB: 02-13-1971  DOS: 10/03/2024  Provider: Emmy MARLA Blanch, NP  MRN: [id number]  Delivery: Face-to-face  Specialty: Interventional Pain Management  Type: Established Patient  Setting: Ambulatory outpatient facility  Specialty designation: 09  Referring Prov.: Tower, Laine LABOR, MD  Location: Outpatient office facility       History of present illness (HPI) Ms. Jamie Coleman, a 54 y.o. year old female, is here today because of her Chronic radicular lumbar pain [M54.16, G89.29]. Ms. Jamie Coleman primary complain today is Back Pain (lower)  Pertinent problems: Ms. Spickler has Steroid-induced diabetes mellitus; Vitamin D  deficiency; Cirrhosis, alcoholic (HCC); History of alcoholic hepatitis; Multiple joint pain; H/O liver transplant (HCC); Lumbar spinal stenosis; and Lumbar radiculopathy on their pertinent problem list.  Pain Assessment: Severity of Chronic pain is reported as a 7 /10. Location: Back Lower/right leg to the ankle. Onset: More than a month ago. Quality: Sharp. Timing: Constant. Modifying factor(s): sitting/lying down, rest. Vitals:  height is 5' 1 (1.549 m) and weight is 195 lb (88.5 kg). Her temporal temperature is 97.3 F (36.3 C) (abnormal). Her blood pressure is 97/63 and her pulse is 79. Her respiration is 16 and oxygen saturation is 100%.  BMI: Estimated body mass index is 36.84 kg/m as calculated from the following:   Height as of this encounter: 5' 1 (1.549 m).   Weight as of this encounter: 195 lb (88.5 kg).  Last encounter: 08/08/2024. Last  procedure: Visit date not found.  Reason for encounter: medication management. No change in medical history since last visit.  Patient's pain is at baseline.  Patient continues multimodal pain regimen as prescribed.  States that it provides pain relief and improvement in functional status.   Discussed the use of AI scribe software for clinical note transcription with the patient, who gave verbal consent to proceed.  History of Present Illness   Jamie Coleman is a 53 year old female with chronic back pain who presents for pain management and follow-up on her back and leg symptoms.  She has experienced significant improvement in her back and left leg pain since her last injection in October, with her back and left leg no longer hurting. However, she continues to experience tightness in her back and pain radiating down her right leg, particularly in the buttock and down the leg, with associated tingling and numbness.  She is scheduled to see a neurosurgeon in mid-January for potential back surgery. Her current medication regimen includes oxycodone  taken three times a day, gabapentin  at a lower dose during the day due to drowsiness, and methocarbamol  as needed for muscle spasms, though she avoids it due to fatigue. She reports fatigue as a side effect of oxycodone  and gabapentin .  She experiences cold sensations and numbness in her right leg, particularly down the outside of the leg, and is cautious about stepping off steps due to this weakness. No diabetes, but her magnesium  levels are consistently low due to tacrolimus , which she takes post-liver transplant. Recent lab work shows low magnesium  and white blood cell count, and slightly elevated creatinine. She takes magnesium  supplements regularly. She has a history of long-term  steroid use and her hemoglobin A1c was recently elevated, but she is not currently on insulin .     Pharmacotherapy Assessment   Started oxycodone  (oxy IR/oxycodone ) 5 mg  immediate release every 8 hours as needed for pain. MME=22.50 Monitoring: [place] PMP: PDMP reviewed during this encounter.       Pharmacotherapy: No side-effects or adverse reactions reported. Compliance: No problems identified. Effectiveness: Clinically acceptable.  Jamie Reda CROME, RN  10/03/2024 10:18 AM  Sign when Signing Visit Nursing Pain Medication Assessment:  Safety precautions to be maintained throughout the outpatient stay will include: orient to surroundings, keep bed in low position, maintain call bell within reach at all times, provide assistance with transfer out of bed and ambulation.  Medication Inspection Compliance: Pill count conducted under aseptic conditions, in front of the patient. Neither the pills nor the bottle was removed from the patient's sight at any time. Once count was completed pills were immediately returned to the patient in their original bottle.  Medication: Oxycodone  IR Pill/Patch Count: 90 of 90 pills/patches remain Pill/Patch Appearance: Markings consistent with prescribed medication Bottle Appearance: Standard pharmacy container. Clearly labeled. Filled Date: 20 / 10 / 2025 Last Medication intake:  Day before yesterdaySafety precautions to be maintained throughout the outpatient stay will include: orient to surroundings, keep bed in low position, maintain call bell within reach at all times, provide assistance with transfer out of bed and ambulation.     UDS:  Summary  Date Value Ref Range Status  05/11/2024 FINAL  Final    Comment:    ==================================================================== Compliance Drug Analysis, Ur ==================================================================== Test                             Result       Flag       Units  Drug Present and Declared for Prescription Verification   Gabapentin                      PRESENT      EXPECTED   Methocarbamol                   PRESENT      EXPECTED  Drug Present not  Declared for Prescription Verification   Acetaminophen                   PRESENT      UNEXPECTED   Diphenhydramine                 PRESENT      UNEXPECTED  Drug Absent but Declared for Prescription Verification   Lorazepam                       Not Detected UNEXPECTED ng/mg creat   Tramadol                        Not Detected UNEXPECTED ng/mg creat   Salicylate                     Not Detected UNEXPECTED    Aspirin, as indicated in the declared medication list, is not always    detected even when used as directed.  ==================================================================== Test                      Result    Flag   Units      Ref Range  Creatinine              160              mg/dL      >=79 ==================================================================== Declared Medications:  The flagging and interpretation on this report are based on the  following declared medications.  Unexpected results may arise from  inaccuracies in the declared medications.   **Note: The testing scope of this panel includes these medications:   Gabapentin   Lorazepam   Methocarbamol   Tramadol    **Note: The testing scope of this panel does not include small to  moderate amounts of these reported medications:   Aspirin   **Note: The testing scope of this panel does not include the  following reported medications:   Calcium   Clotrimazole   Eye Drops  Filgrastim  (Nivestym )  Insulin   Mycophenolate  mofetil (Cellcept )  Omeprazole  Prednisone   Sulfamethoxazole  (Bactrim )  Tacrolimus   Tbo-filgrastim   Trimethoprim  (Bactrim )  Valganciclovir   Vitamin D  ==================================================================== For clinical consultation, please call [phone]. ====================================================================     No results found for: CBDTHCR No results found for: D8THCCBX No results found for: D9THCCBX  ROS  Constitutional: Denies any fever or  chills Gastrointestinal: No reported hemesis, hematochezia, vomiting, or acute GI distress Musculoskeletal: low back pain radiated down to lateral aspect of right leg with numbness and cold sensation Neurological: No reported episodes of acute onset apraxia, aphasia, dysarthria, agnosia, amnesia, paralysis, loss of coordination, or loss of consciousness  Medication Review  Calcium  Citrate-Vitamin D , Insulin  Pen Needle, LORazepam , Letermovir , Polyvinyl Alcohol -Povidone, RSV vaccine recomb adjuvanted, Tbo-Filgrastim , aspirin, cholecalciferol , freestyle, gabapentin , glucose blood, insulin  lispro, methocarbamol , mycophenolate , omeprazole, oxyCODONE , pneumococcal 20-valent conjugate vaccine, predniSONE , and tacrolimus   History Review  Allergy: Jamie Coleman is allergic to lipitor [atorvastatin ]. Drug: Jamie Coleman  reports no history of drug use. Alcohol :  reports that she does not currently use alcohol  after a past usage of about 14.0 standard drinks of alcohol  per week. Tobacco:  reports that she quit smoking about 41 years ago. Her smoking use included cigarettes. She has never used smokeless tobacco. Social: Ms. Schadler  reports that she quit smoking about 41 years ago. Her smoking use included cigarettes. She has never used smokeless tobacco. She reports that she does not currently use alcohol  after a past usage of about 14.0 standard drinks of alcohol  per week. She reports that she does not use drugs. Medical:  has a past medical history of Allergy, Anxiety, COVID (05/2021), Difficult intubation, Elevated hemoglobin A1c (08/08/2015), Elevated liver function tests (08/08/2015), GERD (gastroesophageal reflux disease), Hyperlipidemia, Low serum vitamin D  (08/08/2015), Medication management (10/03/2024), Status post endometrial ablation, and Tennis elbow. Surgical: Jamie Coleman  has a past surgical history that includes Cesarean section; Foot surgery (Left); Tonsillectomy; Endometrial ablation;  Laparoscopic tubal ligation (06/09/2011); Ligament repair (Left, 05/08/2016); Extremity wire/pin removal (Left, 07/16/2016); Colonoscopy with propofol  (N/A, 02/09/2022); Esophagogastroduodenoscopy (egd) with propofol  (N/A, 02/09/2022); biopsy (02/09/2022); polypectomy (02/09/2022); and Liver transplant (09/2022). Family: family history includes Alzheimer's disease in her father; Cancer in her mother; Colon polyps in her father; Diabetes in her brother, father, maternal grandfather, paternal grandfather, and paternal grandmother; Heart disease in her father.  Laboratory Chemistry Profile   Renal Lab Results  Component Value Date   BUN 43 (H) 09/25/2022   CREATININE 1.2 (A) 03/09/2024   GFR 34.93 (L) 09/25/2022   GFRAA >60 07/15/2020   GFRNONAA >60 09/22/2022    Hepatic Lab Results  Component Value Date   AST 33 03/09/2024   ALT 61 (A) 03/09/2024  ALBUMIN  3.0 (L) 09/25/2022   ALKPHOS 300 (H) 09/25/2022   HCVAB NON REACTIVE 09/18/2021   LIPASE 59 (H) 09/21/2022   AMMONIA 66 (H) 09/25/2022    Electrolytes Lab Results  Component Value Date   NA 122 (L) 09/25/2022   K 5.3 (H) 09/25/2022   CL 89 (L) 09/25/2022   CALCIUM  9.4 09/25/2022   MG 2.4 09/07/2022   PHOS 3.1 09/17/2021    Bone Lab Results  Component Value Date   VD25OH 62.42 08/30/2019    Inflammation (CRP: Acute Phase) (ESR: Chronic Phase) Lab Results  Component Value Date   CRP 20.1 (H) 09/03/2022   ESRSEDRATE 5 02/15/2023   LATICACIDVEN 1.2 09/21/2022         Note: Above Lab results reviewed.  Recent Imaging Review  DG PAIN CLINIC C-ARM 1-60 MIN NO REPORT Fluoro was used, but no Radiologist interpretation will be provided.  Please refer to NOTES tab for provider progress note. Note: Reviewed        Physical Exam  Vitals: BP 97/63 (Cuff Size: Large)   Pulse 79   Temp (!) 97.3 F (36.3 C) (Temporal)   Resp 16   Ht 5' 1 (1.549 m)   Wt 195 lb (88.5 kg)   LMP 05/31/2019 Comment: spotting  SpO2 100%    BMI 36.84 kg/m  BMI: Estimated body mass index is 36.84 kg/m as calculated from the following:   Height as of this encounter: 5' 1 (1.549 m).   Weight as of this encounter: 195 lb (88.5 kg). Ideal: Ideal body weight: 47.8 kg (105 lb 6.1 oz) Adjusted ideal body weight: 64.1 kg (141 lb 3.6 oz) General appearance: Well nourished, well developed, and well hydrated. In no apparent acute distress Mental status: Alert, oriented x 3 (person, place, & time)       Respiratory: No evidence of acute respiratory distress Eyes: PERLA  Musculoskeletal: +LBP Assessment   Diagnosis Status  1. Chronic radicular lumbar pain   2. Medication management   3. Lumbar radiculopathy   4. Chronic pain syndrome   5. History of alcoholic hepatitis   6. H/O liver transplant (HCC)    Controlled Controlled Controlled   Updated Problems: Problem  Medication Management  Chronic Radicular Lumbar Pain  Chronic Pain Syndrome    Plan of Care  Problem-specific:  Assessment and Plan    Chronic lumbar radiculopathy with neuropathic pain Persistent right leg symptoms despite improved back pain post-injection. Surgery approved with Dr. Cena Bob, pending scheduling. Gabapentin  effective for nerve pain, oxycodone  for breakthrough pain. Concerns about steroid injections due to potential impact on blood sugar and bone health. - Continue oxycodone  three times a day as needed for pain. - Refilled gabapentin  100 mg for nerve pain management. - Use muscle relaxers as needed for spasms. - Discuss with neurosurgeon regarding the timing of surgery and potential need for further injections. - Monitor symptoms and report any significant changes, especially numbness or weakness.  Chronic pain syndrome Pain managed with oxycodone , gabapentin , and muscle relaxers. Reports fatigue as a side effect. Surgery anticipated to address underlying issues. - Continue current pain management regimen with oxycodone , gabapentin , and  muscle relaxers as needed. - Monitor for side effects and adjust medication use accordingly.   Medication management: Patient's pain is controlled with oxycodone , will continue on current medication regimen.  Prescribing drug monitoring (PDMP) reviewed, findings consistent with the use of prescribed medication and no evidence of narcotic misuse or abuse.  Urine drug screening (UDS) up-to-date.  No side effects or adverse reaction reported to medication.  Schedule follow-up in 60 days for medication management.     Jamie Coleman has a current medication list which includes the following long-term medication(s): calcium  citrate-vitamin d , gabapentin , omeprazole, gabapentin , and insulin  lispro.  Pharmacotherapy (Medications Ordered): Meds ordered this encounter  Medications   oxyCODONE  (OXY IR/ROXICODONE ) 5 MG immediate release tablet    Sig: Take 1 tablet (5 mg total) by mouth every 8 (eight) hours as needed for severe pain (pain score 7-10). Must last 30 days.    Dispense:  90 tablet    Refill:  0    Chronic Pain: STOP Act (Not applicable) Fill 1 day early if closed on refill date. Avoid benzodiazepines within 8 hours of opioids   oxyCODONE  (OXY IR/ROXICODONE ) 5 MG immediate release tablet    Sig: Take 1 tablet (5 mg total) by mouth every 8 (eight) hours as needed for severe pain (pain score 7-10). Must last 30 days.    Dispense:  90 tablet    Refill:  0    Chronic Pain: STOP Act (Not applicable) Fill 1 day early if closed on refill date. Avoid benzodiazepines within 8 hours of opioids   gabapentin  (NEURONTIN ) 100 MG capsule    Sig: Take 1 capsule (100 mg total) by mouth 3 (three) times daily. In addition to the 300 mg pill    Dispense:  90 capsule    Refill:  3   Orders:  No orders of the defined types were placed in this encounter.       Return in about 2 months (around 12/04/2024) for (F2F), (MM), Emmy Blanch NP.    Recent Visits Date Type Provider Dept  08/08/24 Office  Visit Jakobe Blau K, NP [facility]  07/19/24 Procedure visit Marcelino Nurse, MD [facility]  Showing recent visits within past 90 days and meeting all other requirements Today's Visits Date Type Provider Dept  10/03/24 Office Visit Anisten Tomassi K, NP [facility]  Showing today's visits and meeting all other requirements Future Appointments Date Type Provider Dept  11/27/24 Appointment Liboria Putnam K, NP [facility]  Showing future appointments within next 90 days and meeting all other requirements  I discussed the assessment and treatment plan with the patient. The patient was provided an opportunity to ask questions and all were answered. The patient agreed with the plan and demonstrated an understanding of the instructions.  Patient advised to call back or seek an in-person evaluation if the symptoms or condition worsens.  I personally spent a total of 30 minutes in the care of the patient today including preparing to see the patient, getting/reviewing separately obtained history, performing a medically appropriate exam/evaluation, counseling and educating, placing orders, referring and communicating with other health care professionals, documenting clinical information in the EHR, independently interpreting results, communicating results, and coordinating care.   Note by: Taijon Vink K Sadye Kiernan, NP (TTS and AI technology used. I apologize for any typographical errors that were not detected and corrected.) Date: 10/03/2024; Time: 11:04 AM

## 2024-10-09 ENCOUNTER — Other Ambulatory Visit: Payer: Self-pay

## 2024-10-10 ENCOUNTER — Other Ambulatory Visit: Payer: Self-pay

## 2024-10-10 NOTE — Progress Notes (Signed)
 Specialty Pharmacy Refill Coordination Note  Jamie Coleman is a 53 y.o. female contacted today regarding refills of specialty medication(s) Letermovir  (Prevymis )   Patient requested [name] at [facility] [place] date: 10/16/24   Medication will be filled on: 10/13/24

## 2024-10-19 ENCOUNTER — Ambulatory Visit
Admission: RE | Admit: 2024-10-19 | Discharge: 2024-10-19 | Disposition: A | Discharge status: Home / Self Care | Source: Ambulatory Visit | Attending: Obstetrics and Gynecology | Admitting: Obstetrics and Gynecology

## 2024-10-19 DIAGNOSIS — Z1231 Encounter for screening mammogram for malignant neoplasm of breast: Secondary | ICD-10-CM | POA: Insufficient documentation

## 2024-10-25 ENCOUNTER — Ambulatory Visit: Payer: Self-pay | Admitting: Obstetrics and Gynecology

## 2024-10-27 ENCOUNTER — Other Ambulatory Visit (HOSPITAL_COMMUNITY): Payer: Self-pay

## 2024-10-27 ENCOUNTER — Other Ambulatory Visit: Payer: Self-pay

## 2024-10-31 ENCOUNTER — Other Ambulatory Visit: Payer: Self-pay

## 2024-10-31 ENCOUNTER — Other Ambulatory Visit (HOSPITAL_COMMUNITY): Payer: Self-pay

## 2024-10-31 MED ORDER — TACROLIMUS 1 MG PO CAPS
ORAL_CAPSULE | ORAL | 11 refills | Status: DC
  Filled 2024-10-31: qty 270, 90d supply, fill #0

## 2024-11-03 ENCOUNTER — Other Ambulatory Visit (HOSPITAL_COMMUNITY): Payer: Self-pay

## 2024-11-03 ENCOUNTER — Other Ambulatory Visit: Payer: Self-pay

## 2024-11-03 NOTE — Progress Notes (Signed)
 Benefits Investigation Started  Reason: $6,031.14 copay  Routed to: [facility] [place]

## 2024-11-06 ENCOUNTER — Other Ambulatory Visit: Payer: Self-pay

## 2024-11-07 ENCOUNTER — Other Ambulatory Visit: Payer: Self-pay

## 2024-11-14 ENCOUNTER — Other Ambulatory Visit: Payer: Self-pay

## 2024-11-14 ENCOUNTER — Other Ambulatory Visit (HOSPITAL_COMMUNITY): Payer: Self-pay

## 2024-11-14 NOTE — Progress Notes (Signed)
 Specialty Pharmacy Refill Coordination Note  Jamie Coleman is a 54 y.o. female contacted today regarding refills of specialty medication(s) Letermovir  (Prevymis )   Patient requested [name] at [facility] at [place] date: 11/17/24   Medication will be filled on: 11/16/24

## 2024-11-15 ENCOUNTER — Other Ambulatory Visit (HOSPITAL_COMMUNITY): Payer: Self-pay

## 2024-11-16 ENCOUNTER — Other Ambulatory Visit: Payer: Self-pay

## 2024-11-16 ENCOUNTER — Other Ambulatory Visit (HOSPITAL_COMMUNITY): Payer: Self-pay

## 2024-11-16 ENCOUNTER — Other Ambulatory Visit (HOSPITAL_BASED_OUTPATIENT_CLINIC_OR_DEPARTMENT_OTHER): Payer: Self-pay

## 2024-11-16 MED ORDER — TACROLIMUS 1 MG PO CAPS
1.0000 mg | ORAL_CAPSULE | Freq: Two times a day (BID) | ORAL | 11 refills | Status: AC
  Filled 2024-11-16: qty 90, 45d supply, fill #0

## 2024-11-17 ENCOUNTER — Other Ambulatory Visit: Payer: Self-pay

## 2024-11-27 ENCOUNTER — Encounter: Admitting: Nurse Practitioner

## 2025-08-09 ENCOUNTER — Ambulatory Visit: Admitting: Obstetrics and Gynecology
